# Patient Record
Sex: Female | Born: 1940 | Race: White | Hispanic: No | Smoking: Never smoker
Health system: Southern US, Community
[De-identification: ages and names within clinical notes are randomized; demographics above are authoritative.]

## PROBLEM LIST (undated history)

## (undated) ENCOUNTER — Ambulatory Visit: Payer: MEDICARE

## (undated) ENCOUNTER — Telehealth

## (undated) ENCOUNTER — Encounter

## (undated) ENCOUNTER — Telehealth: Attending: Hematology & Oncology | Primary: Hematology & Oncology

## (undated) ENCOUNTER — Encounter: Attending: Internal Medicine | Primary: Internal Medicine

## (undated) ENCOUNTER — Encounter: Attending: Hematology & Oncology | Primary: Hematology & Oncology

## (undated) ENCOUNTER — Ambulatory Visit

## (undated) ENCOUNTER — Encounter: Attending: Adult Health | Primary: Adult Health

## (undated) ENCOUNTER — Encounter: Attending: Pharmacist | Primary: Pharmacist

## (undated) ENCOUNTER — Telehealth: Attending: Adult Health | Primary: Adult Health

## (undated) ENCOUNTER — Ambulatory Visit: Attending: Hematology & Oncology | Primary: Hematology & Oncology

## (undated) ENCOUNTER — Telehealth
Attending: Pharmacist Clinician (PhC)/ Clinical Pharmacy Specialist | Primary: Pharmacist Clinician (PhC)/ Clinical Pharmacy Specialist

## (undated) ENCOUNTER — Ambulatory Visit: Payer: MEDICARE | Attending: Hematology & Oncology | Primary: Hematology & Oncology

## (undated) ENCOUNTER — Ambulatory Visit: Payer: MEDICARE | Attending: Adult Health | Primary: Adult Health

## (undated) ENCOUNTER — Institutional Professional Consult (permissible substitution): Payer: MEDICARE

## (undated) ENCOUNTER — Telehealth: Attending: Pharmacist | Primary: Pharmacist

## (undated) ENCOUNTER — Ambulatory Visit: Payer: MEDICARE | Attending: Internal Medicine | Primary: Internal Medicine

## (undated) ENCOUNTER — Ambulatory Visit: Payer: MEDICARE | Attending: Otolaryngology | Primary: Otolaryngology

## (undated) ENCOUNTER — Telehealth: Attending: Research Study | Primary: Research Study

## (undated) DIAGNOSIS — L409 Psoriasis, unspecified: Secondary | ICD-10-CM

## (undated) DIAGNOSIS — F419 Anxiety disorder, unspecified: Secondary | ICD-10-CM

## (undated) DIAGNOSIS — F32A Depression, unspecified: Secondary | ICD-10-CM

## (undated) DIAGNOSIS — K219 Gastro-esophageal reflux disease without esophagitis: Secondary | ICD-10-CM

## (undated) DIAGNOSIS — I447 Left bundle-branch block, unspecified: Secondary | ICD-10-CM

## (undated) DIAGNOSIS — C801 Malignant (primary) neoplasm, unspecified: Secondary | ICD-10-CM

## (undated) DIAGNOSIS — F329 Major depressive disorder, single episode, unspecified: Secondary | ICD-10-CM

## (undated) DIAGNOSIS — N200 Calculus of kidney: Secondary | ICD-10-CM

## (undated) DIAGNOSIS — K279 Peptic ulcer, site unspecified, unspecified as acute or chronic, without hemorrhage or perforation: Secondary | ICD-10-CM

## (undated) DIAGNOSIS — R197 Diarrhea, unspecified: Secondary | ICD-10-CM

## (undated) DIAGNOSIS — G43709 Chronic migraine without aura, not intractable, without status migrainosus: Secondary | ICD-10-CM

## (undated) DIAGNOSIS — E785 Hyperlipidemia, unspecified: Secondary | ICD-10-CM

## (undated) DIAGNOSIS — Z9221 Personal history of antineoplastic chemotherapy: Secondary | ICD-10-CM

## (undated) DIAGNOSIS — R55 Syncope and collapse: Secondary | ICD-10-CM

## (undated) DIAGNOSIS — N6019 Diffuse cystic mastopathy of unspecified breast: Secondary | ICD-10-CM

## (undated) DIAGNOSIS — K449 Diaphragmatic hernia without obstruction or gangrene: Secondary | ICD-10-CM

## (undated) HISTORY — PX: HERNIA REPAIR: SHX51

## (undated) HISTORY — DX: Diaphragmatic hernia without obstruction or gangrene: K44.9

## (undated) HISTORY — DX: Psoriasis, unspecified: L40.9

## (undated) HISTORY — DX: Calculus of kidney: N20.0

## (undated) HISTORY — PX: VESICOVAGINAL FISTULA CLOSURE W/ TAH: SUR271

## (undated) HISTORY — PX: PARTIAL HYSTERECTOMY: SHX80

## (undated) HISTORY — DX: Diffuse cystic mastopathy of unspecified breast: N60.19

## (undated) HISTORY — DX: Hyperlipidemia, unspecified: E78.5

## (undated) HISTORY — DX: Left bundle-branch block, unspecified: I44.7

## (undated) HISTORY — DX: Anxiety disorder, unspecified: F41.9

## (undated) HISTORY — DX: Syncope and collapse: R55

## (undated) HISTORY — DX: Chronic migraine without aura, not intractable, without status migrainosus: G43.709

## (undated) HISTORY — DX: Diarrhea, unspecified: R19.7

## (undated) HISTORY — DX: Major depressive disorder, single episode, unspecified: F32.9

## (undated) HISTORY — DX: Peptic ulcer, site unspecified, unspecified as acute or chronic, without hemorrhage or perforation: K27.9

## (undated) HISTORY — DX: Gastro-esophageal reflux disease without esophagitis: K21.9

## (undated) HISTORY — PX: APPENDECTOMY: SHX54

## (undated) HISTORY — DX: Depression, unspecified: F32.A

## (undated) SURGERY — Surgical Case
Anesthesia: *Unknown

## (undated) NOTE — *Deleted (*Deleted)
Transition of Care [facility]) - Initial/Assessment Note    Patient Details  Name: [name] MRN: [id number] Date of Birth: 11-12-1940  Transition of Care [facility]) CM/SW Contact:    Bing Quarry, RN Phone Number: 02/02/2020, 3:20 PM  Clinical Narrative:                         Patient Goals and CMS Choice        Expected Discharge Plan and Services                                                Prior Living Arrangements/Services                       Activities of Daily Living Home Assistive Devices/Equipment: None ADL Screening (condition at time of admission) Patient's cognitive ability adequate to safely complete daily activities?: Yes Is the patient deaf or have difficulty hearing?: No Does the patient have difficulty seeing, even when wearing glasses/contacts?: No Does the patient have difficulty concentrating, remembering, or making decisions?: No Patient able to express need for assistance with ADLs?: Yes Does the patient have difficulty dressing or bathing?: No Independently performs ADLs?: Yes (appropriate for developmental age) Does the patient have difficulty walking or climbing stairs?: No Weakness of Legs: None Weakness of Arms/Hands: None  Permission Sought/Granted                  Emotional Assessment              Admission diagnosis:  Syncope and collapse [R55] SOB (shortness of breath) [R06.02] Hypoxia [R09.02] Pneumonia due to COVID-19 virus [U07.1, J12.82] COVID-19 [U07.1] Patient Active Problem List   Diagnosis Date Noted  . Pneumonia due to COVID-19 virus 02-21-20  . Acute respiratory failure due to COVID-19 (HCC) Feb 21, 2020  . Thrombocytopenia (HCC) 02/21/20  . Abdominal pain 09/29/2016  . CLL (chronic lymphocytic leukemia) (HCC) 03/15/2016  . Low back pain 04/28/2015  . H/O adenomatous polyp of colon 04/30/2014  . Abdominal pain, epigastric 04/08/2014  . Acute diarrhea 04/08/2014  . Blood in  feces 04/08/2014  . Chronic migraine without aura 03/13/2014  . 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 Hypercholesterolemia 02/05/2014  . 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 Syncope and collapse 10/29/2009  . CHEST PAIN UNSPECIFIED 10/29/2009   PCP:  Lauro Regulus, MD Pharmacy:   [facility] [place], [place] - [address] [place] [address] Phone: [phone] Fax: [phone]  [id number] - [place], [place] - [address] [place] [address] Phone: [phone] Fax: [phone]     Social Determinants of Health (SDOH) Interventions    Readmission Risk Interventions No flowsheet data found.

---

## 1988-05-01 HISTORY — PX: BREAST EXCISIONAL BIOPSY: SUR124

## 2003-05-02 HISTORY — PX: BREAST EXCISIONAL BIOPSY: SUR124

## 2004-03-11 ENCOUNTER — Ambulatory Visit: Payer: Self-pay | Admitting: Internal Medicine

## 2004-04-28 ENCOUNTER — Ambulatory Visit: Payer: Self-pay | Admitting: Physician Assistant

## 2004-05-19 ENCOUNTER — Ambulatory Visit: Payer: Self-pay | Admitting: Internal Medicine

## 2004-07-28 ENCOUNTER — Ambulatory Visit: Payer: Self-pay | Admitting: Internal Medicine

## 2005-04-25 ENCOUNTER — Emergency Department: Payer: Self-pay | Admitting: Emergency Medicine

## 2005-11-07 ENCOUNTER — Ambulatory Visit: Payer: Self-pay | Admitting: Gerontology

## 2006-02-06 ENCOUNTER — Ambulatory Visit: Payer: Self-pay | Admitting: Gerontology

## 2006-06-26 ENCOUNTER — Ambulatory Visit: Payer: Self-pay | Admitting: Unknown Physician Specialty

## 2006-10-03 ENCOUNTER — Ambulatory Visit: Payer: Self-pay | Admitting: Internal Medicine

## 2006-10-26 ENCOUNTER — Inpatient Hospital Stay: Payer: Self-pay | Admitting: Internal Medicine

## 2006-10-26 ENCOUNTER — Other Ambulatory Visit: Payer: Self-pay

## 2006-11-12 ENCOUNTER — Ambulatory Visit: Payer: Self-pay | Admitting: Internal Medicine

## 2007-01-15 ENCOUNTER — Emergency Department: Payer: Self-pay | Admitting: Emergency Medicine

## 2007-01-21 ENCOUNTER — Emergency Department: Payer: Self-pay | Admitting: Emergency Medicine

## 2007-01-21 ENCOUNTER — Other Ambulatory Visit: Payer: Self-pay

## 2007-01-30 ENCOUNTER — Ambulatory Visit: Payer: Self-pay | Admitting: Emergency Medicine

## 2007-10-22 ENCOUNTER — Ambulatory Visit: Payer: Self-pay | Admitting: Internal Medicine

## 2008-02-18 ENCOUNTER — Ambulatory Visit: Payer: Self-pay | Admitting: Internal Medicine

## 2008-09-03 ENCOUNTER — Ambulatory Visit: Payer: Self-pay

## 2008-11-12 ENCOUNTER — Ambulatory Visit: Payer: Self-pay | Admitting: Unknown Physician Specialty

## 2008-11-25 ENCOUNTER — Ambulatory Visit: Payer: Self-pay | Admitting: Unknown Physician Specialty

## 2009-09-03 ENCOUNTER — Encounter: Payer: Self-pay | Admitting: Cardiovascular Disease

## 2009-09-03 LAB — CONVERTED CEMR LAB
ALT: 16 units/L
AST: 14 units/L
Alkaline Phosphatase: 88 units/L
BUN: 20 mg/dL
Calcium: 8.8 mg/dL
Cholesterol: 246 mg/dL
HDL: 47.3 mg/dL
LDL Cholesterol: 173.7 mg/dL
Total Protein: 6.4 g/dL
Triglyceride fasting, serum: 125 mg/dL

## 2009-10-02 ENCOUNTER — Observation Stay: Payer: Self-pay | Admitting: Internal Medicine

## 2009-10-02 ENCOUNTER — Ambulatory Visit: Payer: Self-pay | Admitting: Cardiovascular Disease

## 2009-10-28 ENCOUNTER — Encounter: Payer: Self-pay | Admitting: Cardiovascular Disease

## 2009-10-29 ENCOUNTER — Ambulatory Visit: Payer: Self-pay | Admitting: Cardiovascular Disease

## 2009-10-29 DIAGNOSIS — R55 Syncope and collapse: Secondary | ICD-10-CM

## 2009-10-29 DIAGNOSIS — R079 Chest pain, unspecified: Secondary | ICD-10-CM | POA: Insufficient documentation

## 2009-10-29 HISTORY — DX: Syncope and collapse: R55

## 2009-11-10 ENCOUNTER — Encounter: Payer: Self-pay | Admitting: Cardiovascular Disease

## 2009-11-30 ENCOUNTER — Telehealth: Payer: Self-pay | Admitting: Cardiovascular Disease

## 2010-02-22 ENCOUNTER — Ambulatory Visit: Payer: Self-pay | Admitting: Internal Medicine

## 2010-03-14 ENCOUNTER — Ambulatory Visit: Payer: Self-pay

## 2010-04-13 ENCOUNTER — Ambulatory Visit: Payer: Self-pay | Admitting: Unknown Physician Specialty

## 2010-04-15 LAB — PATHOLOGY REPORT

## 2010-04-21 ENCOUNTER — Ambulatory Visit: Payer: Self-pay | Admitting: Unknown Physician Specialty

## 2010-06-02 NOTE — Progress Notes (Signed)
Summary: Propranolol  ---- Converted from flag ---- ---- 11/19/2009 10:14 AM, Benedict Needy, RN wrote: Call mrs Kemp and find out how the test went with Dr Jenne Campus.  Then she will decide if she wants to start the propranolol 20mg  three times a day. ------------------------------  Phone Note Outgoing Call   Call placed by: Benedict Needy, RN,  November 30, 2009 10:51 AM Call placed to: Patient Summary of Call: pt declined starting propranolol.  Dr. Jenne Campus has referred her to psychiatrist.  Initial call taken by: Benedict Needy, RN,  December 01, 2009 2:25 PM

## 2010-06-02 NOTE — Procedures (Signed)
Summary: Holter and Event  Holter and Event   Imported By: Frazier Butt Chriscoe 11/19/2009 12:04:37  _____________________________________________________________________  External Attachment:    Type:   Image     Comment:   External Document  Appended Document: Holter and Event pt aware of results

## 2010-06-02 NOTE — Assessment & Plan Note (Signed)
Summary: NEW PT   Visit Type:  Initial Consult Primary Provider:  Einar Crow, M.D.  CC:  Mid sternum pain with dizziness and weakness and feels like could pass out at time.Marland Kitchen  History of Present Illness: 70 year old woman with a history of anxiety, depression, recent episodes of syncope with hospitalization at [facility] that included a thorough evaluation including head CT, carotid ultrasound, CT scan stress test, CT of the chest which showed no significant abnormalities. Potasium was 3.1.  She was started on a 30 day monitor and she presents for followup.  She reports that she has had a total of 4 episodes of syncope. She continues to have dizziness, a feeling of "swimmy headedness ". These symptoms occur at both rest and stress. Sometimes they are positional in nature. She recently had a root canal on several teeth and has had a headache since that time. No further episodes of syncope. No arrhythmia noted to date on her monitor.  she has been evaluated by Dr.Klein  in July of 2008. 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  Echocardiogram from June 2000 and shows normal systolic function, mild mitral regurgitation, mild tricuspid regurgitation.  Stress test in June of 2001 shows no significant ischemia, ejection fraction 75%. This was a left CT scan.  Ultrasound of the carotids shows calcified and soft plaque in the proximal ICA on the right and soft plaque elsewhere in the right bulb and on the left in the bulb and ICA, less than 50%.  Total cholesterol 166, LDL 98, HDL 35. TSH 1.15.  EKG shows normal sinus rhythm with rate of 70 beats per minute, no significant ST or T wave changes.  Current Medications (verified): 1)  Advair Diskus 250-50 Mcg/dose Aepb (Fluticasone-Salmeterol) .... One Puff Two Times A Day 2)  Allegra 180 Mg Tabs (Fexofenadine Hcl) .... Once Daily 3)  Combivent 18-103 Mcg/act Aero  (Ipratropium-Albuterol) .... Inhale Two Puffs Using Four Times A Day As Needed 4)  Flonase 50 Mcg/act Susp (Fluticasone Propionate) .... Spray Two Sprays Into Both Nostrils Two Times A Day 5)  Lorazepam 0.5 Mg Tabs (Lorazepam) .... One Tablet Two Times A Day 6)  Pravastatin Sodium 40 Mg Tabs (Pravastatin Sodium) .... Two Tablets Once Daily 7)  Sertaline 1mg  .... Once Daily 8)  Tylenol With Codeine #3 300-30 Mg Tabs (Acetaminophen-Codeine) .Marland Kitchen.. 1 Table Every Four Hours As Needed For Pain. 9)  Ibuprofen 200 Mg Tabs (Ibuprofen) .... 4-5 Tablets Everyday For Back Pain  Allergies (verified): No Known Drug Allergies  Past History:  Past Medical History: Last updated: 10/28/2009 Anxiety Asthma Hyperlipidemia fibrocystic disease Hiatal hernia GERD Psoriasis Osteoporosis Depression Renal Stones Peptic ulcer disease  Past Surgical History: Last updated: 10/28/2009 hysterectomy appendectomy breast biopsies  Family History: Last updated: 10/28/2009 Family History of Coronary Artery Disease:   Social History: Last updated: 10/28/2009 Married  Tobacco Use - No.  Alcohol Use - no  Risk Factors: Smoking Status: never (10/28/2009)  Review of Systems       The patient complains of syncope.  The patient denies fever, weight loss, weight gain, vision loss, decreased hearing, hoarseness, chest pain, dyspnea on exertion, peripheral edema, prolonged cough, abdominal pain, incontinence, muscle weakness, depression, and enlarged lymph nodes.    Vital Signs:  Patient profile:   70 year old female Height:      68 inches Weight:      190.50 pounds BMI:     29.07 Pulse  rate:   72 / minute BP sitting:   137 / 81  (left arm) Cuff size:   regular  Vitals Entered By: Bishop Dublin, CMA (October 29, 2009 2:35 PM)  Physical Exam  General:  Well developed, well nourished, in no acute distress. Head:  normocephalic and atraumatic Neck:  Neck supple, no JVD. No masses, thyromegaly or  abnormal cervical nodes. Chest Wall:  no deformities or breast masses noted Lungs:  Clear bilaterally to auscultation and percussion. Heart:  Non-displaced PMI, chest non-tender; regular rate and rhythm, S1, S2 without murmurs, rubs or gallops. Carotid upstroke normal, no bruit.  Pedals normal pulses. No edema, no varicosities. Abdomen:  Bowel sounds positive; abdomen soft and non-tender without masses Msk:  Back normal, normal gait. Muscle strength and tone normal. Pulses:  pulses normal in all 4 extremities Extremities:  No clubbing or cyanosis. Neurologic:  Alert and oriented x 3. Skin:  Intact without lesions or rashes. Psych:  Normal affect.   Impression & Recommendations:  Problem # 1:  SYNCOPE AND COLLAPSE (ICD-780.2) etiology of her syncope is likely due to vasovagal syncope/neurocardiogenic syncope. I have encouraged her to increase her fluid hydration, consider wearing TED hose, even consider liberalizing her salt intake. she will continue to wear the monitor for a total of 30 days.  Previous evaluation by Dr. Graciela Husbands had suggested considering SSRIs, limiting stress, retrying beta blockers. On his previous evaluation, she had severe exercise intolerance with evidence of chronotropic incompetence. Perhaps she would benefit from some regular exercise.  She continues to feel some episodes of dizziness over the past week or so consistent with her previous symptoms prior to syncope. No arrhythmia seen on her monitor. This raises the possibility of a vestibular problem. I have recommended that she could have a second opinion from ENT.  we will see her back after her monitors complete.   Other Orders: EKG w/ Interpretation (93000)  Appended Document: NEW PT cholesterol is very elevated with LDL greater than 170, total cholesterol 246, HDL 47 We will talk to her on her next visit about if she would like to start a statin.  Appended Document: NEW PT 30 day event monitor shows no  arrhythymia. Will discuss with patient. Suggest we could try low dose propranolol for possible neurocardiogenic syncope

## 2010-09-13 NOTE — Letter (Signed)
November 12, 2006    Einar Crow, M.D.  [address]  [place], [place] [address]   RE:  [name], [name]  MRN:  [id number]  /  DOB:  02/25/1941   Dear Gaynell Face:   It is a pleasure to see Ms. Leanord Hawking at your request today.  She  is a very pleasant woman with a great deal of stress in her life  currently, as you know.  She is age 70, with her husband having  Alzheimer's, her mother having Alzheimer's, and she having had a life-  long history of recurrent syncope and pre-syncope which is associated  with atypical idiosyncratic program.  These spells are characterized by  significant fatigue, flushing and diaphoresis in the program, flushing  and diaphoresis and urination in the recovery phase with residual  orthostatic intolerance.  The pre-syncopal episodes are provoked simply  by standing and in fact by showers.  The most recent pre-syncopal  episodes occurred shortly after shoulder surgery, one in the kitchen  where she was found to be anemic and one after swimming where she got  quite hot.  In her mind, all of these were preceded by the same program  with which she has been familiar for the last 40-50 years.   She is a salt eater.  She has some peripheral edema and this has  resulted in cutting back on her salt.  Her fluid status is quite  depleted.  Her urine is quite dark.  She does not have hypertension or  diabetes.   She has undergone extensive testing by Dr. Bobbye Riggs, demonstrating a  negative perfusion study and negative echocardiogram, but a strikingly  limited exercise tolerance with evidence of chronotropic incompetence.   PAST MEDICAL HISTORY:  1. Her related medical history if notable for depression and stress,      which is striking.  2. In addition to the above is notable for emphysema.  3. Constipation, now giving rise to diarrhea.  4. Dry eyes and dry mouth.   PAST SURGICAL HISTORY:  1. Appendectomy.  2. Partial  hysterectomy.   SOCIAL HISTORY:  As noted as before.  She does not use cigarettes,  alcohol or recreational drugs.  She owns a Manufacturing systems engineer.   CURRENT MEDICATIONS:  1. Effexor, dose unknown.  2. Advair.  3. Propranolol 80 mg, recently initiated.   ALLERGIES:  No known drug allergies.   PHYSICAL EXAMINATION:  GENERAL:  She is an elderly Caucasian female,  appearing her stated age of 26.  VITAL SIGNS:  Blood pressure 110/72, pulse 62, with insignificant  orthostatic change.  HEENT:  Demonstrates no icterus or xanthoma.  NECK:  Veins were flat.  The carotids brisk and full bilaterally without  bruits.  BACK:  Without kyphosis or scoliosis.  LUNGS:  Clear.  HEART:  Sounds regular without murmurs or gallops.  ABDOMEN:  Soft, with active bowel sounds without midline pulsation or  hepatomegaly.  EXTREMITIES:  Femoral pulses 2+, distal pulses intact.  There was no  clubbing or cyanosis or edema.  NEUROLOGIC:  Grossly normal.  SKIN:  Warm and dry.   Electrocardiogram dated today demonstrated a sinus rhythm at 62 with an  interval of 0.20, 0.09, 0.40.  The electrocardiogram was otherwise  normal.   IMPRESSION:  1. Neurocardiogenic syncope with recent increase in the severity and      frequency of her episodes, potentially related to increase in      psychosocial stress, as well as ambient  triggers.  2. Relative state of volume depletion.  3. Exercise intolerance - striking, with evidence of chronotropic      incompetence.  4. Anxiety/depression.   Gaynell Face, Ms. 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  I hope the beta blocker that  you give her does serve some good, as it might well.  In addition, I  think that intentional therapy directed at her stressful social  situation may be particularly useful.  Some of the SSRIs can be helpful  here, targeting both the depression as well as neurotransmission of  serotonin, related to  neurally-mediated syncope.   The other issue which I would like to discuss with you is the evidence  of chronotropic incompetence and whether cardiopulmonary stress testing  might be of value in trying to elucidate the limitations here.  She has  a history of emphysema and asthma, and I wonder whether pulmonary  function tests may be helpful as a prelude to this.   I will look forward to talking about her.  Thanks very much for asking  [place] to see her.    Sincerely,      Duke Salvia, MD, [facility]  Electronically Signed    SCK/MedQ  DD: 11/12/2006  DT: 11/13/2006  Job #: [id number]

## 2010-10-14 ENCOUNTER — Encounter: Payer: Self-pay | Admitting: Cardiology

## 2011-02-28 ENCOUNTER — Ambulatory Visit: Payer: Self-pay | Admitting: Internal Medicine

## 2011-06-29 ENCOUNTER — Ambulatory Visit: Payer: Self-pay | Admitting: Internal Medicine

## 2012-01-04 ENCOUNTER — Ambulatory Visit: Payer: Self-pay | Admitting: Unknown Physician Specialty

## 2012-02-22 ENCOUNTER — Ambulatory Visit: Payer: Self-pay | Admitting: Unknown Physician Specialty

## 2012-03-01 ENCOUNTER — Ambulatory Visit: Payer: Self-pay | Admitting: Unknown Physician Specialty

## 2012-06-26 ENCOUNTER — Ambulatory Visit: Payer: Self-pay | Admitting: Internal Medicine

## 2012-07-25 ENCOUNTER — Ambulatory Visit: Payer: Self-pay | Admitting: Internal Medicine

## 2012-09-27 ENCOUNTER — Ambulatory Visit: Payer: Self-pay | Admitting: Cardiology

## 2012-12-06 ENCOUNTER — Ambulatory Visit: Payer: Self-pay | Admitting: Cardiology

## 2013-07-28 ENCOUNTER — Ambulatory Visit: Payer: Self-pay | Admitting: Internal Medicine

## 2014-02-05 DIAGNOSIS — I447 Left bundle-branch block, unspecified: Secondary | ICD-10-CM

## 2014-02-05 DIAGNOSIS — E7800 Pure hypercholesterolemia, unspecified: Secondary | ICD-10-CM | POA: Insufficient documentation

## 2014-02-05 HISTORY — DX: Left bundle-branch block, unspecified: I44.7

## 2014-03-13 DIAGNOSIS — G43709 Chronic migraine without aura, not intractable, without status migrainosus: Secondary | ICD-10-CM | POA: Insufficient documentation

## 2014-03-13 DIAGNOSIS — F324 Major depressive disorder, single episode, in partial remission: Secondary | ICD-10-CM | POA: Insufficient documentation

## 2014-03-13 HISTORY — DX: Chronic migraine without aura, not intractable, without status migrainosus: G43.709

## 2014-04-08 DIAGNOSIS — R197 Diarrhea, unspecified: Secondary | ICD-10-CM | POA: Insufficient documentation

## 2014-04-08 DIAGNOSIS — R1013 Epigastric pain: Secondary | ICD-10-CM | POA: Insufficient documentation

## 2014-04-08 DIAGNOSIS — K921 Melena: Secondary | ICD-10-CM | POA: Insufficient documentation

## 2014-04-08 HISTORY — DX: Diarrhea, unspecified: R19.7

## 2014-04-15 ENCOUNTER — Ambulatory Visit: Payer: Self-pay | Admitting: Unknown Physician Specialty

## 2014-04-15 LAB — CLOSTRIDIUM DIFFICILE(ARMC)

## 2014-04-17 LAB — STOOL CULTURE

## 2014-04-30 DIAGNOSIS — Z860101 Personal history of adenomatous and serrated colon polyps: Secondary | ICD-10-CM | POA: Insufficient documentation

## 2014-04-30 DIAGNOSIS — Z86010 Personal history of colonic polyps: Secondary | ICD-10-CM | POA: Insufficient documentation

## 2014-05-04 ENCOUNTER — Ambulatory Visit: Payer: Self-pay | Admitting: Unknown Physician Specialty

## 2014-07-23 ENCOUNTER — Ambulatory Visit: Payer: Self-pay | Admitting: Internal Medicine

## 2014-08-06 ENCOUNTER — Ambulatory Visit
Admit: 2014-08-06 | Disposition: A | Discharge status: Home / Self Care | Payer: Self-pay | Attending: Obstetrics and Gynecology | Admitting: Obstetrics and Gynecology

## 2014-08-24 LAB — SURGICAL PATHOLOGY

## 2015-02-09 ENCOUNTER — Other Ambulatory Visit: Payer: Self-pay | Admitting: Physician Assistant

## 2015-02-09 DIAGNOSIS — R1084 Generalized abdominal pain: Secondary | ICD-10-CM

## 2015-02-09 DIAGNOSIS — R1032 Left lower quadrant pain: Secondary | ICD-10-CM

## 2015-02-09 DIAGNOSIS — R197 Diarrhea, unspecified: Secondary | ICD-10-CM

## 2015-02-11 ENCOUNTER — Ambulatory Visit
Admission: RE | Admit: 2015-02-11 | Discharge: 2015-02-11 | Disposition: A | Discharge status: Home / Self Care | Payer: Medicare Other | Source: Ambulatory Visit | Attending: Physician Assistant | Admitting: Physician Assistant

## 2015-02-11 DIAGNOSIS — K449 Diaphragmatic hernia without obstruction or gangrene: Secondary | ICD-10-CM | POA: Insufficient documentation

## 2015-02-11 DIAGNOSIS — K5730 Diverticulosis of large intestine without perforation or abscess without bleeding: Secondary | ICD-10-CM | POA: Diagnosis not present

## 2015-02-11 DIAGNOSIS — R1032 Left lower quadrant pain: Secondary | ICD-10-CM

## 2015-02-11 DIAGNOSIS — R1084 Generalized abdominal pain: Secondary | ICD-10-CM

## 2015-02-11 DIAGNOSIS — R197 Diarrhea, unspecified: Secondary | ICD-10-CM

## 2015-02-11 MED ORDER — IOHEXOL 300 MG/ML  SOLN
100.0000 mL | Freq: Once | INTRAMUSCULAR | Status: AC | PRN
  Administered 2015-02-11: 100 mL via INTRAVENOUS

## 2015-03-01 ENCOUNTER — Ambulatory Visit: Payer: Self-pay | Admitting: Podiatry

## 2015-03-29 ENCOUNTER — Encounter: Payer: Self-pay | Admitting: *Deleted

## 2015-03-30 ENCOUNTER — Ambulatory Visit (INDEPENDENT_AMBULATORY_CARE_PROVIDER_SITE_OTHER): Payer: Medicare Other | Admitting: Obstetrics and Gynecology

## 2015-03-30 ENCOUNTER — Encounter: Payer: Self-pay | Admitting: Obstetrics and Gynecology

## 2015-03-30 VITALS — BP 132/75 | HR 70 | Resp 18 | Ht 68.0 in | Wt 189.7 lb

## 2015-03-30 DIAGNOSIS — R350 Frequency of micturition: Secondary | ICD-10-CM | POA: Diagnosis not present

## 2015-03-30 DIAGNOSIS — R310 Gross hematuria: Secondary | ICD-10-CM | POA: Diagnosis not present

## 2015-03-30 LAB — MICROSCOPIC EXAMINATION

## 2015-03-30 LAB — URINALYSIS, COMPLETE
Bilirubin, UA: NEGATIVE
GLUCOSE, UA: NEGATIVE
KETONES UA: NEGATIVE
NITRITE UA: NEGATIVE
Protein, UA: NEGATIVE
SPEC GRAV UA: 1.025 (ref 1.005–1.030)
UUROB: 0.2 mg/dL (ref 0.2–1.0)
pH, UA: 5.5 (ref 5.0–7.5)

## 2015-03-30 NOTE — Progress Notes (Signed)
03/30/2015 9:32 AM   [name] 1941/04/12 [id number]  Referring provider: [name], MD [place], [place] [address]  Chief Complaint  Patient presents with  . Hematuria  . Establish Care    HPI: Patient is a 74 year old female with a history of kidney stones presenting as a referral for recurrent episodes of gross hematuria. Per available lab results patient's had numerous urinalyses positive for microscopic hematuria dating back to May 2015. All urine cultures that have been sent since that time have been negative for infection. It seems the patient was originally referred to [facility] urological and she was not told that they did not accept her insurance and the appointment was not scheduled.  Urinary symptoms include foul smelling urine, occasional dysuria, urinary frequency q 2 hours, nocturia 3-4 times per night and urge incontinence at night.  Mid low back pain for the last 3-4 days. No fevers.  Never smoker. Retired. Worked as a Psychiatric nurse for 45 years.  S/p hysterectomy.  No vaginal complaints.  01/18/15 Cr 0.9 Hbg 12.7  PMH: Past Medical History  Diagnosis Date  . Anxiety   . Asthma   . Hyperlipidemia   . Fibrocystic disease of breast   . Hiatal hernia   . GERD (gastroesophageal reflux disease)   . Psoriasis   . Osteoporosis   . Depression   . Renal stone   . Peptic ulcer disease     Surgical History: Past Surgical History  Procedure Laterality Date  . Vesicovaginal fistula closure w/ tah    . Appendectomy    . Breast biopsy      Home Medications:    Medication List       This list is accurate as of: 03/30/15  9:32 AM.  Always use your most recent med list.               acetaminophen 500 MG tablet  Commonly known as:  TYLENOL  1-2 tablets by mouth single dose as needed for pain.     cabergoline 0.5 MG tablet  Commonly known as:  DOSTINEX  Take by mouth.     fexofenadine 180 MG tablet   Commonly known as:  ALLEGRA  Take 180 mg by mouth daily.     fluticasone 50 MCG/ACT nasal spray  Commonly known as:  FLONASE  Place 2 sprays into the nose 2 (two) times daily.     HYDROcodone-acetaminophen 5-325 MG tablet  Commonly known as:  NORCO/VICODIN     ibuprofen 200 MG tablet  Commonly known as:  ADVIL,MOTRIN  Take 200 mg by mouth 4 (four) times daily.     Iron Polysacch Cmplx-B12-FA 150-0.025-1 MG Caps  Take by mouth.     LORazepam 0.5 MG tablet  Commonly known as:  ATIVAN  Take 0.5 mg by mouth 2 (two) times daily.     omeprazole 40 MG capsule  Commonly known as:  PRILOSEC     pantoprazole 40 MG tablet  Commonly known as:  PROTONIX  Take by mouth.     pravastatin 40 MG tablet  Commonly known as:  PRAVACHOL  Take 80 mg by mouth daily.     sucralfate 1 G tablet  Commonly known as:  CARAFATE        Allergies: No Known Allergies  Family History: Family History  Problem Relation Age of Onset  . Coronary artery disease Other   . Hematuria Father     Social History:  reports that she has never smoked. She does not have any smokeless tobacco history on file. She reports that she does not drink alcohol. Her drug history is not on file.  ROS: UROLOGY Frequent Urination?: Yes Hard to postpone urination?: No Burning/pain with urination?: No Get up at night to urinate?: Yes Leakage of urine?: No Urine stream starts and stops?: No Trouble starting stream?: No Do you have to strain to urinate?: No Blood in urine?: Yes Urinary tract infection?: No Sexually transmitted disease?: No Injury to kidneys or bladder?: No Painful intercourse?: No Weak stream?: No Currently pregnant?: No Vaginal bleeding?: No Last menstrual period?: n/a  Gastrointestinal Nausea?: Yes Vomiting?: No Indigestion/heartburn?: Yes Diarrhea?: Yes Constipation?: Yes  Constitutional Fever: No Night sweats?: Yes Weight loss?: No Fatigue?: Yes  Skin Skin rash/lesions?:  No Itching?: Yes  Eyes Blurred vision?: Yes Double vision?: No  Ears/Nose/Throat Sore throat?: Yes Sinus problems?: Yes  Hematologic/Lymphatic Swollen glands?: No Easy bruising?: Yes  Cardiovascular Leg swelling?: Yes Chest pain?: Yes  Respiratory Cough?: Yes Shortness of breath?: Yes  Endocrine Excessive thirst?: Yes  Musculoskeletal Back pain?: Yes Joint pain?: Yes  Neurological Headaches?: Yes Dizziness?: Yes  Psychologic Depression?: Yes Anxiety?: Yes  Physical Exam: BP 132/75 mmHg  Pulse 70  Resp 18  Ht 5\' 8"  (1.727 m)  Wt 189 lb 11.2 oz (86.047 kg)  BMI 28.85 kg/m2  Constitutional:  Alert and oriented, No acute distress. HEENT: [place] AT, moist mucus membranes.  Trachea midline, no masses. Cardiovascular: No clubbing, cyanosis, or edema. Respiratory: Normal respiratory effort, no increased work of breathing. GI: Abdomen is soft, nontender, nondistended, no abdominal masses GU: No CVA tenderness.  Skin: No rashes, bruises or suspicious lesions. Lymph: No cervical or inguinal adenopathy. Neurologic: Grossly intact, no focal deficits, moving all 4 extremities. Psychiatric: Normal mood and affect.  Laboratory Data:   Urinalysis   Pertinent Imaging:   Assessment & Plan:    1. Gross Hematuria- We discussed the differential diagnosis for hematuria including nephrolithiasis, renal or upper tract tumors, bladder stones, UTIs, or bladder tumors as well as undetermined etiologies. Per AUA guidelines, I did recommend complete hematuria evaluation including CTU, possible urine cytology, and office cystoscopy. - Urinalysis, Complete -CT Urogram  2. Urinary frequency and nocturnal urge incontinence-  Will address urinary symptoms once hematuria work up complete.  Return for CT Urogram results/cystoscopy.  These notes generated with voice recognition software. I apologize for typographical errors.  Herbert Moors, [facility] [address] [place], [place] [address] [phone]

## 2015-03-30 NOTE — Patient Instructions (Signed)
Hematuria, Adult  Hematuria is blood in your urine. It can be caused by a bladder infection, kidney infection, prostate infection, kidney stone, or cancer of your urinary tract. Infections can usually be treated with medicine, and a kidney stone usually will pass through your urine. If neither of these is the cause of your hematuria, further workup to find out the reason may be needed.  It is very important that you tell your health care provider about any blood you see in your urine, even if the blood stops without treatment or happens without causing pain. Blood in your urine that happens and then stops and then happens again can be a symptom of a very serious condition. Also, pain is not a symptom in the initial stages of many urinary cancers.  HOME CARE INSTRUCTIONS   · Drink lots of fluid, 3-4 quarts a day. If you have been diagnosed with an infection, cranberry juice is especially recommended, in addition to large amounts of water.  · Avoid caffeine, tea, and carbonated beverages because they tend to irritate the bladder.  · Avoid alcohol because it may irritate the prostate.  · Take all medicines as directed by your health care provider.  · If you were prescribed an antibiotic medicine, finish it all even if you start to feel better.  · If you have been diagnosed with a kidney stone, follow your health care provider's instructions regarding straining your urine to catch the stone.  · Empty your bladder often. Avoid holding urine for long periods of time.  · After a bowel movement, women should cleanse front to back. Use each tissue only once.  · Empty your bladder before and after sexual intercourse if you are a female.  SEEK MEDICAL CARE IF:  · You develop back pain.  · You have a fever.  · You have a feeling of sickness in your stomach (nausea) or vomiting.  · Your symptoms are not better in 3 days. Return sooner if you are getting worse.  SEEK IMMEDIATE MEDICAL CARE IF:   · You develop severe vomiting and  are unable to keep the medicine down.  · You develop severe back or abdominal pain despite taking your medicines.  · You begin passing a large amount of blood or clots in your urine.  · You feel extremely weak or faint, or you pass out.  MAKE SURE YOU:   · Understand these instructions.  · Will watch your condition.  · Will get help right away if you are not doing well or get worse.     This information is not intended to replace advice given to you by your health care provider. Make sure you discuss any questions you have with your health care provider.     Document Released: 04/17/2005 Document Revised: 05/08/2014 Document Reviewed: 12/16/2012  Elsevier Interactive Patient Education ©2016 [employer]  Cystoscopy  Cystoscopy is a procedure that is used to help your caregiver diagnose and sometimes treat conditions that affect your lower urinary tract. Your lower urinary tract includes your bladder and the tube through which urine passes from your bladder out of your body (urethra). Cystoscopy is performed with a thin, tube-shaped instrument (cystoscope). The cystoscope has lenses and a light at the end so that your caregiver can see inside your bladder. The cystoscope is inserted at the entrance of your urethra. Your caregiver guides it through your urethra and into your bladder. There are two main types of cystoscopy:  · Flexible cystoscopy (with   a flexible cystoscope).  · Rigid cystoscopy (with a rigid cystoscope).  Cystoscopy may be recommended for many conditions, including:  · Urinary tract infections.  · Blood in your urine (hematuria).  · Loss of bladder control (urinary incontinence) or overactive bladder.  · Unusual cells found in a urine sample.  · Urinary blockage.  · Painful urination.  Cystoscopy may also be done to remove a sample of your tissue to be checked under a microscope (biopsy). It may also be done to remove or destroy bladder stones.  LET YOUR CAREGIVER KNOW ABOUT:  · Allergies to food or  medicine.  · Medicines taken, including vitamins, herbs, eyedrops, over-the-counter medicines, and creams.  · Use of steroids (by mouth or creams).  · Previous problems with anesthetics or numbing medicines.  · History of bleeding problems or blood clots.  · Previous surgery.  · Other health problems, including diabetes and kidney problems.  · Possibility of pregnancy, if this applies.  PROCEDURE  The area around the opening to your urethra will be cleaned. A medicine to numb your urethra (local anesthetic) is used. If a tissue sample or stone is removed during the procedure, you may be given a medicine to make you sleep (general anesthetic).  Your caregiver will gently insert the tip of the cystoscope into your urethra. The cystoscope will be slowly glided through your urethra and into your bladder. Sterile fluid will flow through the cystoscope and into your bladder. The fluid will expand and stretch your bladder. This gives your caregiver a better view of your bladder walls. The procedure lasts about 15-20 minutes.  AFTER THE PROCEDURE  If a local anesthetic is used, you will be allowed to go home as soon as you are ready. If a general anesthetic is used, you will be taken to a recovery area until you are stable. You may have temporary bleeding and burning on urination.     This information is not intended to replace advice given to you by your health care provider. Make sure you discuss any questions you have with your health care provider.     Document Released: 04/14/2000 Document Revised: 05/08/2014 Document Reviewed: 10/09/2011  Elsevier Interactive Patient Education ©2016 Elsevier Inc.

## 2015-04-06 ENCOUNTER — Ambulatory Visit
Admission: RE | Admit: 2015-04-06 | Discharge: 2015-04-06 | Disposition: A | Discharge status: Home / Self Care | Payer: Medicare Other | Source: Ambulatory Visit | Attending: Obstetrics and Gynecology | Admitting: Obstetrics and Gynecology

## 2015-04-06 DIAGNOSIS — K449 Diaphragmatic hernia without obstruction or gangrene: Secondary | ICD-10-CM | POA: Insufficient documentation

## 2015-04-06 DIAGNOSIS — R599 Enlarged lymph nodes, unspecified: Secondary | ICD-10-CM | POA: Insufficient documentation

## 2015-04-06 DIAGNOSIS — R310 Gross hematuria: Secondary | ICD-10-CM | POA: Insufficient documentation

## 2015-04-06 DIAGNOSIS — R161 Splenomegaly, not elsewhere classified: Secondary | ICD-10-CM | POA: Insufficient documentation

## 2015-04-06 MED ORDER — IOHEXOL 300 MG/ML  SOLN: 150.0000 mL | Freq: Once | INTRAMUSCULAR | Status: DC | PRN

## 2015-04-07 ENCOUNTER — Telehealth: Payer: Self-pay | Admitting: Urology

## 2015-04-07 NOTE — Telephone Encounter (Signed)
Pt called asking for results of CT Urogram.  Pt has an appt on 12/15 to see Dr. Erlene Quan for those results & cystoscopy.  Pt stated that she was in a lot of pain and could not wait until 12/15 to get results.  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  Please call.

## 2015-04-07 NOTE — Telephone Encounter (Signed)
Please see if she can get her in sooner? She needs to have an appointment where she'll go over the results as well as have a cystoscopy. The CT scan did not show any obstructing stones or anything that needs to be emergently addressed. We please ask her where she is having pain.  Thanks

## 2015-04-13 NOTE — Telephone Encounter (Signed)
Patient scheduled 04/14/2015 for CT results & cysto with Dr. Erlene Quan.

## 2015-04-14 ENCOUNTER — Other Ambulatory Visit: Payer: Medicare Other | Admitting: Urology

## 2015-04-14 ENCOUNTER — Ambulatory Visit (INDEPENDENT_AMBULATORY_CARE_PROVIDER_SITE_OTHER): Payer: Medicare Other | Admitting: Urology

## 2015-04-14 VITALS — BP 166/90 | HR 87 | Ht 68.0 in | Wt 184.6 lb

## 2015-04-14 DIAGNOSIS — R350 Frequency of micturition: Secondary | ICD-10-CM | POA: Diagnosis not present

## 2015-04-14 DIAGNOSIS — R310 Gross hematuria: Secondary | ICD-10-CM

## 2015-04-14 DIAGNOSIS — R300 Dysuria: Secondary | ICD-10-CM

## 2015-04-14 LAB — URINALYSIS, COMPLETE
BILIRUBIN UA: NEGATIVE
Glucose, UA: NEGATIVE
Ketones, UA: NEGATIVE
Nitrite, UA: NEGATIVE
Specific Gravity, UA: 1.02 (ref 1.005–1.030)
Urobilinogen, Ur: 0.2 mg/dL (ref 0.2–1.0)
pH, UA: 5.5 (ref 5.0–7.5)

## 2015-04-14 LAB — MICROSCOPIC EXAMINATION: RBC, UA: >30 /hpf — ABNORMAL HIGH (ref 0–?)

## 2015-04-14 MED ORDER — CEPHALEXIN 500 MG PO CAPS: 500.0000 mg | ORAL_CAPSULE | Freq: Three times a day (TID) | ORAL | Status: DC

## 2015-04-14 NOTE — Progress Notes (Signed)
9:19 AM  04/14/2015   [name] 1940/06/07 [id number]  Referring provider: [name], MD [place], [place] [address]  Chief Complaint  Patient presents with  . Cysto    HPI: 74 year old female with history of kidney stones and microscopic/ gross hematuria.  She presents today to complete her hematuria work up with cystoscopy. CT urogram showed no GU pathology. She does have some incidental borderline pelvic adenopathy and slight splenomegaly.    She does complain today of dysuria which started 4 days ago. She's also had increased urgency and frequency. She also may have had some low-grade temps although she has not been using a thermometer. UA today suspicious for infection.  She's had severe lower back pain over the past few weeks radiate down the back of her left thigh. She's getting an MRI for further workup with this tomorrow.  Never smoker. Retired. Worked as a Psychiatric nurse for 66 years.     PMH: Past Medical History  Diagnosis Date  . Anxiety   . Asthma   . Hyperlipidemia   . Fibrocystic disease of breast   . Hiatal hernia   . GERD (gastroesophageal reflux disease)   . Psoriasis   . Osteoporosis   . Depression   . Renal stone   . Peptic ulcer disease     Surgical History: Past Surgical History  Procedure Laterality Date  . Vesicovaginal fistula closure w/ tah    . Appendectomy    . Breast biopsy      Home Medications:    Medication List       This list is accurate as of: 04/14/15  9:19 AM.  Always use your most recent med list.               acetaminophen 500 MG tablet  Commonly known as:  TYLENOL  1-2 tablets by mouth single dose as needed for pain.     fexofenadine 180 MG tablet  Commonly known as:  ALLEGRA  Take 180 mg by mouth daily.     fluticasone 50 MCG/ACT nasal spray  Commonly known as:  FLONASE  Place 2 sprays into the nose 2 (two) times daily.     ibuprofen 200 MG tablet   Commonly known as:  ADVIL,MOTRIN  Take 200 mg by mouth 4 (four) times daily.     Iron Polysacch Cmplx-B12-FA 150-0.025-1 MG Caps  Take by mouth.     LORazepam 0.5 MG tablet  Commonly known as:  ATIVAN  Take 0.5 mg by mouth 2 (two) times daily.     omeprazole 40 MG capsule  Commonly known as:  PRILOSEC     oxyCODONE-acetaminophen 5-325 MG tablet  Commonly known as:  PERCOCET/ROXICET     pantoprazole 40 MG tablet  Commonly known as:  PROTONIX  Take by mouth.     pravastatin 40 MG tablet  Commonly known as:  PRAVACHOL  Take 80 mg by mouth daily.     sucralfate 1 G tablet  Commonly known as:  CARAFATE        Allergies: No Known Allergies  Family History: Family History  Problem Relation Age of Onset  . Coronary artery disease Other   . Hematuria Father     Social History:  reports that she has never smoked. She does not have any smokeless tobacco history on file. She reports that she does not drink alcohol. Her drug history is not on file.   Physical Exam: BP  166/90 mmHg  Pulse 87  Ht 5\' 8"  (1.727 m)  Wt 184 lb 9.6 oz (83.734 kg)  BMI 28.07 kg/m2  Constitutional:  Alert and oriented, No acute distress. HEENT: [place] AT, moist mucus membranes.  Trachea midline, no masses. Cardiovascular: No clubbing, cyanosis, or edema. Respiratory: Normal respiratory effort, no increased work of breathing. GI: Abdomen is soft, nontender, nondistended, no abdominal masses GU: No CVA tenderness.  Skin: No rashes, bruises or suspicious lesions. Neurologic: Grossly intact, no focal deficits, moving all 4 extremities. Psychiatric: Normal mood and affect.  Urinalysis See Epic, suspicious for infection.  Pertinent Imaging:  CLINICAL DATA: Micro hematuria occurred in October and November. RIGHT-sided back pain. Fall 2 weeks prior.  EXAM: CT ABDOMEN AND PELVIS WITHOUT AND WITH CONTRAST  TECHNIQUE: Multidetector CT imaging of the abdomen and pelvis was performed following the  standard protocol before and following the bolus administration of intravenous contrast.  CONTRAST: 125 cc Omnipaque  COMPARISON: CT 02/11/2015  FINDINGS: Lower chest: Lung bases are clear.  Hepatobiliary: No focal hepatic lesion. No biliary duct dilatation. Gallbladder is normal. Common bile duct is normal.  Pancreas: Pancreas is normal. No ductal dilatation. No pancreatic inflammation.  Spleen: Spleen is mildly enlarged with a calculated volume of 613 cubic cm.  Adrenals/urinary tract: Adrenal glands are normal. Non IV contrast images demonstrate no nephrolithiasis. No ureterolithiasis or obstructive uropathy. Cortical phase imaging demonstrates no enhancing lesion. Delayed pyelogram phase imaging demonstrates no filling defects within the collecting systems or ureters. There bilateral parapelvic cysts noted.  No bladder calculi. No filling within the bladder  Stomach/Bowel: Large hiatal hernia with the near entirety of the stomach above the hemidiaphragms. Duodenum and small bowel normal. Appendix not identified. There are scattered diverticula without acute inflammation. Rectum normal.  Vascular/Lymphatic: Abdominal aorta is normal caliber with atherosclerotic calcification. There is no retroperitoneal or periportal lymphadenopathy. No pelvic lymphadenopathy. Mildly enlarged LEFT common iliac lymph node measures 10 mm (image 48, series 4). LEFT EXTERNAL ILIAC LYMPH NODE MEASURES 6 MM ON IMAGE 64, SERIES 4. LEFT EXTERNAL ILIAC LYMPH NODE MEASURES 11 MM ON IMAGE 75 SERIES 4.  LARGEST LYMPH NODES is RIGHT EXTERNAL ILIAC LYMPH NODE MEASURES 19 MM SHORT AXIS ON IMAGE 73, SERIES 4.  Reproductive: Post hysterectomy.  Other: Small amount free fluid in the pelvis.  Musculoskeletal: No aggressive osseous lesion.  IMPRESSION: 1. No explanation for hematuria. No nephrolithiasis, ureterolithiasis, enhancing renal cortical lesion, or filling defects within  the collecting systems. 2. No bladder stones or filling defects in the bladder which does not excluded a bladder lesion. 3. Mild pelvic lymphadenopathy and mild splenomegaly. Correlate with mild lymphoproliferative disorder. 4. Large hiatal hernia.   Electronically Signed  By: Suzy Bouchard M.D.  On: 04/06/2015 16:06  Assessment & Plan:   1. Dysuria-  Symptomatic, treat for presumed infection (Keflex tid x 3 days) F/u urine culture  2.  Gross/ microscopic hematuria- CT Urogram reviewed  3. Urinary frequency and nocturnal urge incontinence-  Will address urinary symptoms once hematuria work up complete.  Return in about 2 weeks (around 04/28/2015) for reschedule cystoscopy.  Hollice Espy, MD  [facility] [address] [place], [place] [address] [phone]

## 2015-04-14 NOTE — Progress Notes (Signed)
When pt was checking out she developed light headedness, sweating, chills, and weakness. Pt was placed in a room given crackers, water, and VS taken. BP 154/89, P85, Temp 97.5. Pt was c/o of severe, 10/10, back pain. Pt was advised to go to the ER. Pt called a family member to take her to ER.

## 2015-04-15 ENCOUNTER — Other Ambulatory Visit: Payer: Medicare Other | Admitting: Urology

## 2015-04-16 LAB — CULTURE, URINE COMPREHENSIVE

## 2015-04-28 ENCOUNTER — Ambulatory Visit (HOSPITAL_BASED_OUTPATIENT_CLINIC_OR_DEPARTMENT_OTHER): Payer: Medicare Other | Admitting: Hematology and Oncology

## 2015-04-28 ENCOUNTER — Encounter: Payer: Self-pay | Admitting: Hematology and Oncology

## 2015-04-28 VITALS — BP 166/75 | HR 75 | Temp 98.1°F | Resp 18 | Ht 68.0 in | Wt 185.7 lb

## 2015-04-28 DIAGNOSIS — M898X8 Other specified disorders of bone, other site: Secondary | ICD-10-CM

## 2015-04-28 DIAGNOSIS — M5441 Lumbago with sciatica, right side: Secondary | ICD-10-CM | POA: Diagnosis not present

## 2015-04-28 DIAGNOSIS — M545 Low back pain, unspecified: Secondary | ICD-10-CM | POA: Insufficient documentation

## 2015-04-28 MED ORDER — METAXALONE 400 MG PO TABS: 400.0000 mg | ORAL_TABLET | Freq: Two times a day (BID) | ORAL | Status: DC

## 2015-04-28 NOTE — Assessment & Plan Note (Signed)
Severe low back pain with pain radiating down bilateral lower extremities: MRI of the spine reveals severe degenerative arthritic changes at multiple levels especially L4-L5 and L5-S1 which is suspect is the major cause of her symptoms. Even though there is no disc herniation or foraminal narrowing I suspect her symptoms are related to degenerative arthritis.  On clinical examination there was severe back spasms. For this reason I prescribed her muscle relaxant with Skelaxin 400 mg by mouth twice a day when necessary for the muscle aches and pains.  Increased bone marrow activity in the spine: I reviewed the MRI report. I do not have corresponding lab values during that time with regards to a CBC. I will request lab core to send [place] all the lab results that they have. Previously she has not been in a make our has not been found to have evidence of myeloproliferative disease. It is unclear to me what the bone marrow activity indicates other than to suggest that there is excessive blood production in those areas for uncertain reasons. It is not a pathologic sign. It is not the cause of her symptoms either.   Back pain: Since oral pain medications have not been relieving her symptoms, patient and her family requested a referral to neurosurgery. Patient's daughter-in-law had seen Dr. Barbaraann Barthel with neurosurgery. 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  We will try to obtain her blood work and call her if there are any lab values that would need additional workup regarding the increased bone marrow activity.

## 2015-04-28 NOTE — Progress Notes (Signed)
[facility] NOTE  Patient Care Team: [name], MD as PCP - General (Internal Medicine)  CHIEF COMPLAINTS/PURPOSE OF CONSULTATION:  Increased bone marrow activity  HISTORY OF PRESENTING ILLNESS:  [name] 74 y.o. female is here because of recent diagnosis of increased bone marrow activity. Patient recently fell from her bed and hurt her rib cage. A few days later started having pain in the knee and the right shoulder. Subsequently she started having pain in the lower back. She had seen orthopedic specialists were performed MRI of her back that revealed multilevel degenerative arthritic changes and L4-L5 and L5-S1 disc protrusion but no prolapse or foraminal narrowing. She also noticed hematuria and an underwent a CT of her abdomen. The CT scan showed small pelvic lymphadenopathy that are not pathologic as well as mild splenomegaly.  Patient is extremely uncomfortable with the low back pain and has been taking pain medications around the clock and 6 in spite of that she still has pain. She has episodes when she does not have any pain at all. When the pain hits she is very uncomfortable and keeps her awake at night.  I reviewed her records extensively and collaborated the history with the patient.  MEDICAL HISTORY:  Past Medical History  Diagnosis Date  . Anxiety   . Asthma   . Hyperlipidemia   . Fibrocystic disease of breast   . Hiatal hernia   . GERD (gastroesophageal reflux disease)   . Psoriasis   . Osteoporosis   . Depression   . Renal stone   . Peptic ulcer disease     SURGICAL HISTORY: Past Surgical History  Procedure Laterality Date  . Vesicovaginal fistula closure w/ tah    . Appendectomy    . Breast biopsy      SOCIAL HISTORY: Social History   Social History  . Marital Status: Widowed    Spouse Name: N/A  . Number of Children: N/A  . Years of Education: N/A   Occupational History  . Not on file.   Social History Main  Topics  . Smoking status: Never Smoker   . Smokeless tobacco: Not on file  . Alcohol Use: No  . Drug Use: Not on file  . Sexual Activity: Not on file   Other Topics Concern  . Not on file   Social History Narrative   Married    FAMILY HISTORY: Family History  Problem Relation Age of Onset  . Coronary artery disease Other   . Hematuria Father     ALLERGIES:  has No Known Allergies.  MEDICATIONS:  Current Outpatient Prescriptions  Medication Sig Dispense Refill  . acetaminophen (TYLENOL) 500 MG tablet 1-2 tablets by mouth single dose as needed for pain.    . cephALEXin (KEFLEX) 500 MG capsule Take 1 capsule (500 mg total) by mouth 3 (three) times daily. 12 capsule 0  . fexofenadine (ALLEGRA) 180 MG tablet Take 180 mg by mouth daily.      . fluticasone (FLONASE) 50 MCG/ACT nasal spray Place 2 sprays into the nose 2 (two) times daily.      Marland Kitchen ibuprofen (ADVIL,MOTRIN) 200 MG tablet Take 200 mg by mouth 4 (four) times daily.      . Iron Polysacch Cmplx-B12-FA 150-0.025-1 MG CAPS Take by mouth.    Marland Kitchen LORazepam (ATIVAN) 0.5 MG tablet Take 0.5 mg by mouth 2 (two) times daily.      Marland Kitchen omeprazole (PRILOSEC) 40 MG capsule     . oxyCODONE-acetaminophen (  PERCOCET/ROXICET) 5-325 MG tablet     . pantoprazole (PROTONIX) 40 MG tablet Take by mouth.    . pravastatin (PRAVACHOL) 40 MG tablet Take 80 mg by mouth daily.      . sucralfate (CARAFATE) 1 G tablet     . metaxalone (SKELAXIN) 400 MG tablet Take 1 tablet (400 mg total) by mouth 2 (two) times daily. 60 tablet 0   No current facility-administered medications for this visit.    REVIEW OF SYSTEMS:   Constitutional: Denies fevers, chills or abnormal night sweats Eyes: Denies blurriness of vision, double vision or watery eyes Ears, nose, mouth, throat, and face: Denies mucositis or sore throat Respiratory: Denies cough, dyspnea or wheezes Cardiovascular: Denies palpitation, chest discomfort or lower extremity swelling Gastrointestinal:   Denies nausea, heartburn or change in bowel habits Skin: Denies abnormal skin rashes Lymphatics: Denies new lymphadenopathy or easy bruising Neurological: Severe low back pain and tenderness to palpation of the lower back. Behavioral/Psych: Mood is stable, no new changes  Breast: Nipple discharge and has seen multiple specialists for this in the past. All other systems were reviewed with the patient and are negative.  PHYSICAL EXAMINATION: ECOG PERFORMANCE STATUS: 2 - Symptomatic, <50% confined to bed  Filed Vitals:   04/28/15 1549  BP: 166/75  Pulse: 75  Temp: 98.1 F (36.7 C)  Resp: 18   Filed Weights   04/28/15 1549  Weight: 185 lb 11.2 oz (84.233 kg)    GENERAL:alert, no distress and comfortable SKIN: skin color, texture, turgor are normal, no rashes or significant lesions EYES: normal, conjunctiva are pink and non-injected, sclera clear OROPHARYNX:no exudate, no erythema and lips, buccal mucosa, and tongue normal  NECK: supple, thyroid normal size, non-tender, without nodularity LYMPH:  no palpable lymphadenopathy in the cervical, axillary or inguinal LUNGS: clear to auscultation and percussion with normal breathing effort HEART: regular rate & rhythm and no murmurs and no lower extremity edema ABDOMEN:abdomen soft, non-tender and normal bowel sounds Musculoskeletal: Severe muscle spasm and tenderness to palpation of the lower back paraspinal muscles. PSYCH: alert & oriented x 3 with fluent speech NEURO: no focal motor/sensory deficits  LABORATORY DATA:  I have reviewed the data as listed No results found for: WBC, HGB, HCT, MCV, PLT Lab Results  Component Value Date   NA 138.0 09/03/2009   K 4.3 09/03/2009   CL 106 09/03/2009   CO2 27.8 09/03/2009   ASSESSMENT AND PLAN:  Low back pain Severe low back pain with pain radiating down bilateral lower extremities: MRI of the spine reveals severe degenerative arthritic changes at multiple levels especially L4-L5 and  L5-S1 which is suspect is the major cause of her symptoms. Even though there is no disc herniation or foraminal narrowing I suspect her symptoms are related to degenerative arthritis.  On clinical examination there was severe back spasms. For this reason I prescribed her muscle relaxant with Skelaxin 400 mg by mouth twice a day when necessary for the muscle aches and pains.  Increased bone marrow activity in the spine: I reviewed the MRI report. I do not have corresponding lab values during that time with regards to a CBC. I will request lab core to send [place] all the lab results that they have. Previously she has not been in a make our has not been found to have evidence of myeloproliferative disease. It is unclear to me what the bone marrow activity indicates other than to suggest that there is excessive blood production in those areas for uncertain  reasons. It is not a pathologic sign. It is not the cause of her symptoms either.   Back pain: Since oral pain medications have not been relieving her symptoms, patient and her family requested a referral to neurosurgery. Patient's daughter-in-law had seen Dr. Barbaraann Barthel with neurosurgery. 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  We will try to obtain her blood work and call her if there are any lab values that would need additional workup regarding the increased bone marrow activity.    we will not make any appointments for her unless there are other findings on the blood work that will raise a concern for a hematologic disorder. All questions were answered. The patient knows to call the clinic with any problems, questions or concerns.    Rulon Eisenmenger, MD 04/28/2015

## 2015-04-29 ENCOUNTER — Other Ambulatory Visit: Payer: Self-pay | Admitting: *Deleted

## 2015-04-29 ENCOUNTER — Encounter: Payer: Self-pay | Admitting: Urology

## 2015-04-29 ENCOUNTER — Telehealth: Payer: Self-pay | Admitting: *Deleted

## 2015-04-29 ENCOUNTER — Other Ambulatory Visit: Payer: Self-pay | Admitting: Hematology and Oncology

## 2015-04-29 ENCOUNTER — Other Ambulatory Visit: Payer: Medicare Other | Admitting: Urology

## 2015-04-29 ENCOUNTER — Telehealth: Payer: Self-pay | Admitting: Hematology and Oncology

## 2015-04-29 DIAGNOSIS — M5441 Lumbago with sciatica, right side: Secondary | ICD-10-CM

## 2015-04-29 MED ORDER — TIZANIDINE HCL 2 MG PO TABS: 2.0000 mg | ORAL_TABLET | Freq: Three times a day (TID) | ORAL | Status: DC | PRN

## 2015-04-29 NOTE — Telephone Encounter (Signed)
Spoke with patient and she is aware of her lab 12/30

## 2015-04-29 NOTE — Telephone Encounter (Signed)
[name] with Dr. Ruthann Cancer Anderson's office called reporting "no recent labs on this patient.  "We haven't seen her in over six months.  Not sure she's still our patient."

## 2015-04-29 NOTE — Telephone Encounter (Signed)
Per 12/29 note from [place] she sent the referral to dr Leda Gauze office already

## 2015-04-29 NOTE — Telephone Encounter (Signed)
Returned patients call to reschedule her  appointment °

## 2015-04-29 NOTE — Telephone Encounter (Signed)
Faxed referral to Dr. Donnella Bi office.

## 2015-04-30 ENCOUNTER — Other Ambulatory Visit (HOSPITAL_BASED_OUTPATIENT_CLINIC_OR_DEPARTMENT_OTHER): Payer: Medicare Other

## 2015-04-30 ENCOUNTER — Ambulatory Visit: Payer: Medicare Other | Admitting: Oncology

## 2015-04-30 DIAGNOSIS — M5441 Lumbago with sciatica, right side: Secondary | ICD-10-CM

## 2015-04-30 LAB — CBC & DIFF AND RETIC
BASO%: 0.2 % (ref 0.0–2.0)
BASOS ABS: 0 10*3/uL (ref 0.0–0.1)
EOS ABS: 0.1 10*3/uL (ref 0.0–0.5)
EOS%: 1.3 % (ref 0.0–7.0)
HCT: 37.7 % (ref 34.8–46.6)
HEMOGLOBIN: 13 g/dL (ref 11.6–15.9)
IMMATURE RETIC FRACT: 7.7 % (ref 1.60–10.00)
LYMPH%: 61.9 % — ABNORMAL HIGH (ref 14.0–49.7)
MCH: 30.7 pg (ref 25.1–34.0)
MCHC: 34.5 g/dL (ref 31.5–36.0)
MCV: 89.1 fL (ref 79.5–101.0)
MONO#: 0.4 10*3/uL (ref 0.1–0.9)
MONO%: 4.2 % (ref 0.0–14.0)
NEUT%: 32.4 % — ABNORMAL LOW (ref 38.4–76.8)
NEUTROS ABS: 2.9 10*3/uL (ref 1.5–6.5)
NRBC: 0 % (ref 0–0)
Platelets: 142 10*3/uL — ABNORMAL LOW (ref 145–400)
RBC: 4.23 10*6/uL (ref 3.70–5.45)
RDW: 14.2 % (ref 11.2–14.5)
RETIC %: 1.56 % (ref 0.70–2.10)
RETIC CT ABS: 65.99 10*3/uL (ref 33.70–90.70)
WBC: 9 10*3/uL (ref 3.9–10.3)
lymph#: 5.6 10*3/uL — ABNORMAL HIGH (ref 0.9–3.3)

## 2015-04-30 LAB — COMPREHENSIVE METABOLIC PANEL
ALBUMIN: 4 g/dL (ref 3.5–5.0)
ALT: 21 U/L (ref 0–55)
AST: 19 U/L (ref 5–34)
Alkaline Phosphatase: 84 U/L (ref 40–150)
Anion Gap: 7 mEq/L (ref 3–11)
BUN: 21.8 mg/dL (ref 7.0–26.0)
CHLORIDE: 108 meq/L (ref 98–109)
CO2: 24 meq/L (ref 22–29)
Calcium: 9.3 mg/dL (ref 8.4–10.4)
Creatinine: 0.8 mg/dL (ref 0.6–1.1)
EGFR: 69 mL/min/{1.73_m2} — AB (ref >90)
GLUCOSE: 109 mg/dL (ref 70–140)
POTASSIUM: 5 meq/L (ref 3.5–5.1)
SODIUM: 139 meq/L (ref 136–145)
TOTAL PROTEIN: 6.8 g/dL (ref 6.4–8.3)
Total Bilirubin: 0.61 mg/dL (ref 0.20–1.20)

## 2015-04-30 LAB — TECHNOLOGIST REVIEW

## 2015-05-02 HISTORY — PX: EXCISION OF BREAST BIOPSY: SHX5822

## 2015-05-04 ENCOUNTER — Telehealth: Payer: Self-pay

## 2015-05-04 ENCOUNTER — Other Ambulatory Visit: Payer: Medicare Other | Admitting: Urology

## 2015-05-04 NOTE — Telephone Encounter (Signed)
Referral sent to Dr. Sherwood Gambler - referral order, faxe sheet, insurance card, last office note.  Faxed to [facility].  Let pt know all info sent and she should be hearing from [facility] at Dr Carrie Mew.  Pt voiced understanding.

## 2015-05-05 ENCOUNTER — Other Ambulatory Visit: Payer: Medicare Other

## 2015-05-05 DIAGNOSIS — R300 Dysuria: Secondary | ICD-10-CM

## 2015-05-05 LAB — SPEP & IFE WITH QIG
Albumin ELP: 3.9 g/dL (ref 3.8–4.8)
Alpha-1-Globulin: 0.3 g/dL (ref 0.2–0.3)
Alpha-2-Globulin: 0.6 g/dL (ref 0.5–0.9)
Beta 2: 0.3 g/dL (ref 0.2–0.5)
Beta Globulin: 0.4 g/dL (ref 0.4–0.6)
Gamma Globulin: 0.9 g/dL (ref 0.8–1.7)
IGG (IMMUNOGLOBIN G), SERUM: 996 mg/dL (ref 690–1700)
IGM, SERUM: 59 mg/dL (ref 52–322)
IgA: 87 mg/dL (ref 69–380)
TOTAL PROTEIN, SERUM ELECTROPHOR: 6.4 g/dL (ref 6.1–8.1)

## 2015-05-05 LAB — MICROSCOPIC EXAMINATION

## 2015-05-05 LAB — URINALYSIS, COMPLETE
BILIRUBIN UA: NEGATIVE
Glucose, UA: NEGATIVE
Ketones, UA: NEGATIVE
NITRITE UA: NEGATIVE
PH UA: 5.5 (ref 5.0–7.5)
Specific Gravity, UA: >1.03 — ABNORMAL HIGH (ref 1.005–1.030)
UUROB: 0.2 mg/dL (ref 0.2–1.0)

## 2015-05-06 ENCOUNTER — Encounter: Payer: Self-pay | Admitting: Urology

## 2015-05-06 ENCOUNTER — Ambulatory Visit (INDEPENDENT_AMBULATORY_CARE_PROVIDER_SITE_OTHER): Payer: Medicare Other | Admitting: Urology

## 2015-05-06 VITALS — BP 133/79 | HR 76 | Ht 68.0 in | Wt 184.8 lb

## 2015-05-06 DIAGNOSIS — R310 Gross hematuria: Secondary | ICD-10-CM | POA: Diagnosis not present

## 2015-05-06 LAB — URINALYSIS, COMPLETE
BILIRUBIN UA: NEGATIVE
Glucose, UA: NEGATIVE
LEUKOCYTES UA: NEGATIVE
NITRITE UA: NEGATIVE
PH UA: 5.5 (ref 5.0–7.5)
UUROB: 0.2 mg/dL (ref 0.2–1.0)

## 2015-05-06 LAB — MICROSCOPIC EXAMINATION: RBC, UA: >30 /hpf — ABNORMAL HIGH (ref 0–?)

## 2015-05-06 MED ORDER — CIPROFLOXACIN HCL 500 MG PO TABS
500.0000 mg | ORAL_TABLET | Freq: Once | ORAL | Status: AC
  Administered 2015-05-06: 500 mg via ORAL

## 2015-05-06 MED ORDER — LIDOCAINE HCL 2 % EX GEL
1.0000 "application " | Freq: Once | CUTANEOUS | Status: AC
  Administered 2015-05-06: 1 via URETHRAL

## 2015-05-06 NOTE — Progress Notes (Signed)
05/06/2015 12:14 PM   [name] 14-Dec-1940 [id number]  Referring provider: [name], MD [facility] [place], [place] [address]  Chief Complaint  Patient presents with  . Cysto    gross/microscopic hematuria    HPI: 75 year old female with history of kidney stones and microscopic/ gross hematuria. She presents today to complete her hematuria work up with cystoscopy. CT urogram showed no GU pathology. She does have some incidental borderline pelvic adenopathy and slight splenomegaly.   The patient also notes urinary frequency and nocturia 4. She has occasional incontinence with sneezing or coughing. She does not find this very bothersome at this time. She is not interested in medications. She is more concerned with her back pain that radiates to her lower extremities.  Never smoker. Retired. Worked as a Psychiatric nurse for 12 years.    PMH: Past Medical History  Diagnosis Date  . Anxiety   . Asthma   . Hyperlipidemia   . Fibrocystic disease of breast   . Hiatal hernia   . GERD (gastroesophageal reflux disease)   . Psoriasis   . Osteoporosis   . Depression   . Renal stone   . Peptic ulcer disease   . 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 Chronic migraine without aura 03/13/2014    Last Assessment & Plan:  Headaches stable generally   . Syncope and collapse 10/29/2009    Qualifier: Diagnosis of  By: Rockey Situ MD, Tim    . Acute diarrhea 04/08/2014    Surgical History: Past Surgical History  Procedure Laterality Date  . Vesicovaginal fistula closure w/ tah    . Appendectomy    . Breast biopsy      Home Medications:    Medication List       This list is accurate as of: 05/06/15 12:14 PM.  Always use your most recent med list.               acetaminophen 500 MG tablet  Commonly known as:  TYLENOL  1-2 tablets by mouth single dose as needed for pain.     cephALEXin 500 MG capsule  Commonly known as:  KEFLEX  Take 1  capsule (500 mg total) by mouth 3 (three) times daily.     fexofenadine 180 MG tablet  Commonly known as:  ALLEGRA  Take 180 mg by mouth daily.     fluticasone 50 MCG/ACT nasal spray  Commonly known as:  FLONASE  Place 2 sprays into the nose 2 (two) times daily.     ibuprofen 200 MG tablet  Commonly known as:  ADVIL,MOTRIN  Take 200 mg by mouth 4 (four) times daily.     Iron Polysacch Cmplx-B12-FA 150-0.025-1 MG Caps  Take by mouth.     LORazepam 0.5 MG tablet  Commonly known as:  ATIVAN  Take 0.5 mg by mouth 2 (two) times daily.     metaxalone 400 MG tablet  Commonly known as:  SKELAXIN  Take 1 tablet (400 mg total) by mouth 2 (two) times daily.     omeprazole 40 MG capsule  Commonly known as:  PRILOSEC     oxyCODONE-acetaminophen 5-325 MG tablet  Commonly known as:  PERCOCET/ROXICET     pantoprazole 40 MG tablet  Commonly known as:  PROTONIX  Take by mouth.     pravastatin 40 MG tablet  Commonly known as:  PRAVACHOL  Take 80 mg by mouth daily.     sucralfate 1 g  tablet  Commonly known as:  CARAFATE     tiZANidine 2 MG tablet  Commonly known as:  ZANAFLEX  Take 1 tablet (2 mg total) by mouth every 8 (eight) hours as needed for muscle spasms.        Allergies: No Known Allergies  Family History: Family History  Problem Relation Age of Onset  . Coronary artery disease Other   . Hematuria Father     Social History:  reports that she has never smoked. She does not have any smokeless tobacco history on file. She reports that she does not drink alcohol. Her drug history is not on file.  ROS: UROLOGY Frequent Urination?: Yes Hard to postpone urination?: No Burning/pain with urination?: Yes Get up at night to urinate?: Yes Leakage of urine?: No Urine stream starts and stops?: No Trouble starting stream?: No Do you have to strain to urinate?: No Blood in urine?: Yes Urinary tract infection?: No Sexually transmitted disease?: No Injury to kidneys or  bladder?: No Painful intercourse?: No Weak stream?: No Currently pregnant?: No Vaginal bleeding?: No Last menstrual period?: N/A  Gastrointestinal Nausea?: No Vomiting?: No Indigestion/heartburn?: No Diarrhea?: No Constipation?: No  Constitutional Fever: No Night sweats?: No Weight loss?: No  Skin Skin rash/lesions?: No Itching?: No  Eyes Blurred vision?: No Double vision?: No  Ears/Nose/Throat Sore throat?: No Sinus problems?: No  Hematologic/Lymphatic Swollen glands?: No Easy bruising?: No  Cardiovascular Leg swelling?: No Chest pain?: No  Respiratory Cough?: No Shortness of breath?: No  Endocrine Excessive thirst?: No  Musculoskeletal Back pain?: Yes Joint pain?: No  Neurological Headaches?: No Dizziness?: No  Psychologic Depression?: No Anxiety?: Yes  Physical Exam: BP 133/79 mmHg  Pulse 76  Ht 5\' 8"  (1.727 m)  Wt 184 lb 12.8 oz (83.825 kg)  BMI 28.11 kg/m2  Constitutional:  Alert and oriented, No acute distress. HEENT: [place] AT, moist mucus membranes.  Trachea midline, no masses. Cardiovascular: No clubbing, cyanosis, or edema. Respiratory: Normal respiratory effort, no increased work of breathing. GI: Abdomen is soft, nontender, nondistended, no abdominal masses GU: No CVA tenderness.  Skin: No rashes, bruises or suspicious lesions. Lymph: No cervical or inguinal adenopathy. Neurologic: Grossly intact, no focal deficits, moving all 4 extremities. Psychiatric: Normal mood and affect.  Laboratory Data: Lab Results  Component Value Date   WBC 9.0 04/30/2015   HGB 13.0 04/30/2015   HCT 37.7 04/30/2015   MCV 89.1 04/30/2015   PLT 142* 04/30/2015    Lab Results  Component Value Date   CREATININE 0.8 04/30/2015    No results found for: PSA  No results found for: TESTOSTERONE  No results found for: HGBA1C  Urinalysis    Component Value Date/Time   GLUCOSEU Negative 05/05/2015 1513   BILIRUBINUR Negative 05/05/2015 1513    NITRITE Negative 05/05/2015 1513   LEUKOCYTESUR Trace* 05/05/2015 1513     Cystoscopy Procedure Note  Patient identification was confirmed, informed consent was obtained, and patient was prepped using Betadine solution.  Lidocaine jelly was administered per urethral meatus.    Preoperative abx where received prior to procedure.    Procedure: - Flexible cystoscope introduced, without any difficulty.   - Thorough search of the bladder revealed:    normal urethral meatus    normal urothelium    no stones    no ulcers     no tumors    no urethral polyps    no trabeculation  - Ureteral orifices were normal in position and appearance.  Post-Procedure: -  Patient tolerated the procedure well   Assessment & Plan:    1. Gross hematuria Negative hematuria work up. Follow up in 1 year with repeat urinalysis  2. Urinary frequency The patient doesn't find it bothersome at this time. She is more concerned with her lower back pain that radiates to her legs. She will call the office if this becomes bothersome to her.   Return in about 1 year (around 05/05/2016) for for urinalysis.  Nickie Retort, MD  [facility] [address] [place], [place] [address] [phone]

## 2015-05-11 ENCOUNTER — Encounter: Payer: Self-pay | Admitting: Hematology and Oncology

## 2015-05-11 NOTE — Progress Notes (Signed)
I sent staff message to nurse-Terri to see what drugs were tried and failed. I need for prior auth for metaxalone per [employer]

## 2015-05-17 ENCOUNTER — Encounter: Payer: Self-pay | Admitting: Hematology and Oncology

## 2015-05-17 NOTE — Progress Notes (Signed)
I faxed office notes to [employer] for appeal on denial of [place] for metaxalone. [phone]

## 2015-05-19 ENCOUNTER — Telehealth: Payer: Self-pay | Admitting: *Deleted

## 2015-05-19 ENCOUNTER — Encounter: Payer: Self-pay | Admitting: Hematology and Oncology

## 2015-05-19 NOTE — Progress Notes (Signed)
Answered questions with jeida for review for appeal on metaxalone.

## 2015-05-19 NOTE — Telephone Encounter (Signed)
Call received from [facility].  Call transferred to [facility].

## 2015-05-20 ENCOUNTER — Telehealth: Payer: Self-pay | Admitting: Neurology

## 2015-05-20 ENCOUNTER — Ambulatory Visit (INDEPENDENT_AMBULATORY_CARE_PROVIDER_SITE_OTHER): Payer: Medicare Other | Admitting: Neurology

## 2015-05-20 ENCOUNTER — Other Ambulatory Visit: Payer: Self-pay | Admitting: Neurology

## 2015-05-20 ENCOUNTER — Encounter: Payer: Self-pay | Admitting: Neurology

## 2015-05-20 ENCOUNTER — Ambulatory Visit
Admission: RE | Admit: 2015-05-20 | Discharge: 2015-05-20 | Disposition: A | Discharge status: Home / Self Care | Payer: Medicare Other | Source: Ambulatory Visit | Attending: Neurology | Admitting: Neurology

## 2015-05-20 ENCOUNTER — Encounter: Payer: Self-pay | Admitting: Hematology and Oncology

## 2015-05-20 VITALS — BP 161/95 | HR 76 | Ht 67.0 in | Wt 184.5 lb

## 2015-05-20 DIAGNOSIS — M5441 Lumbago with sciatica, right side: Secondary | ICD-10-CM

## 2015-05-20 DIAGNOSIS — M25551 Pain in right hip: Secondary | ICD-10-CM

## 2015-05-20 DIAGNOSIS — G8929 Other chronic pain: Secondary | ICD-10-CM

## 2015-05-20 MED ORDER — GABAPENTIN 100 MG PO CAPS: ORAL_CAPSULE | ORAL | Status: DC

## 2015-05-20 NOTE — Progress Notes (Signed)
Reason for visit:  Back pain, right leg pain  Referring physician:  Dr. Jamie Brookes is a 75 y.o. female  History of present illness:   [name] is a 75 year old right-handed white female with a history of onset of back pain and right leg pain that occurred on 03/17/2015. Two weeks before this, she fell out of bed, and bruised her right flank. The patient had also been quite active recently with carrying heavy objects, pulling things out of the attic. The patient began having discomfort in the right lower back, pain into the right hip and groin area, and discomfort going down the leg primarily to the knee but also at times down to the foot. The patient indicates that her entire right leg will tingle. She has some mild left-sided hip pain as well. She denies any pain down the left leg. 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 She has been placed on tizanidine which was helpful to start with, but there is no longer beneficial for her. The patient underwent MRI evaluation of the lumbar spine that was brought for my review. 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 The patient was seen by Dr. Sherwood Gambler, she was not felt to have any surgically amenable problems, and she is referred to this office for an evaluation. The patient denies any further falls. The pain is somewhat worse with standing, but she does have pain even while sitting or lying down. The pain is worse in the evening hours.  Past Medical History  Diagnosis Date  . Anxiety   . Asthma   . Hyperlipidemia   . Fibrocystic disease of breast   . Hiatal hernia   . GERD (gastroesophageal reflux disease)   . Psoriasis   . Osteoporosis   . Depression   . Renal stone   . Peptic ulcer disease   . Block, bundle branch,  left 02/05/2014  . Chronic migraine without aura 03/13/2014    Last Assessment & Plan:  Headaches stable generally   . Syncope and collapse 10/29/2009    Qualifier: Diagnosis of  By: Rockey Situ MD, Tim    . Acute diarrhea 04/08/2014    Past Surgical History  Procedure Laterality Date  . Vesicovaginal fistula closure w/ tah    . Appendectomy    . Breast biopsy    . Partial hysterectomy    . Hernia repair      Family History  Problem Relation Age of Onset  . Coronary artery disease Other   . Hematuria Father   . Congestive Heart Failure Father   . Dementia Mother   . Congestive Heart Failure Mother   . Neuropathy Brother     Social history:  reports that she has never smoked. She has never used smokeless tobacco. She reports that she does not drink alcohol or use illicit drugs.  Medications:  Prior to Admission medications   Medication Sig Start Date End Date Taking? Authorizing Provider  cephALEXin (KEFLEX) 500 MG capsule Take 1 capsule (500 mg total) by mouth 3 (three) times daily. 04/14/15  Yes Hollice Espy, MD  ibuprofen (ADVIL,MOTRIN) 200 MG tablet Take 200 mg by mouth 4 (four) times daily.     Yes Historical Provider, MD  Iron Polysacch Cmplx-B12-FA 150-0.025-1 MG CAPS Take by mouth.  Yes Historical Provider, MD  LORazepam (ATIVAN) 0.5 MG tablet Take 0.5 mg by mouth 2 (two) times daily.     Yes Historical Provider, MD  omeprazole (PRILOSEC) 40 MG capsule  01/28/15  Yes Historical Provider, MD  pravastatin (PRAVACHOL) 40 MG tablet Take 80 mg by mouth daily.     Yes Historical Provider, MD  tiZANidine (ZANAFLEX) 2 MG tablet Take 1 tablet (2 mg total) by mouth every 8 (eight) hours as needed for muscle spasms. 04/29/15  Yes Nicholas Lose, MD  gabapentin (NEURONTIN) 100 MG capsule One capsule three times a day for 1 week, then take 2 capsules three times a day 05/20/15   Kathrynn Ducking, MD     No Known Allergies  ROS:  Out of a complete 14 system review of symptoms, the  patient complains only of the following symptoms, and all other reviewed systems are negative.   Weight gain  Swelling in the legs  Eye pain  Shortness of breath, cough  Constipation  Feeling hot  Joint pain, joint swelling, aching muscles  Memory loss, headache, weakness , dizziness  Blood pressure 161/95, pulse 76, height 5\' 7"  (1.702 m), weight 184 lb 8 oz (83.689 kg).  Physical Exam  General: The patient is alert and cooperative at the time of the examination.  Eyes: Pupils are equal, round, and reactive to light. Discs are flat bilaterally.  Neck: The neck is supple, no carotid bruits are noted.  Respiratory: The respiratory examination is clear.  Cardiovascular: The cardiovascular examination reveals a regular rate and rhythm, no obvious murmurs or rubs are noted.   Neuromuscular: The patient does have some discomfort with external rotation of the right hip are not present on the left. The patient has discomfort palpation over the right SI joint, she reports some radiation of pain down to the knee on the right.  Skin: Extremities are without significant edema.  Neurologic Exam  Mental status: The patient is alert and oriented x 3 at the time of the examination. The patient has apparent normal recent and remote memory, with an apparently normal attention span and concentration ability.  Cranial nerves: Facial symmetry is present. There is good sensation of the face to pinprick and soft touch bilaterally. The strength of the facial muscles and the muscles to head turning and shoulder shrug are normal bilaterally. Speech is well enunciated, no aphasia or dysarthria is noted. Extraocular movements are full. Visual fields are full. The tongue is midline, and the patient has symmetric elevation of the soft palate. No obvious hearing deficits are noted.  Motor: The motor testing reveals 5 over 5 strength of all 4 extremities. Good symmetric motor tone is noted  throughout.  Sensory: Sensory testing is intact to pinprick, soft touch, vibration sensation, and position sense on all 4 extremities, with exception of some stocking pattern pinprick sensory deficit up to the knees bilaterally, decrease in position sense of the left foot and vibration sensation on the right foot. No evidence of extinction is noted.  Coordination: Cerebellar testing reveals good finger-nose-finger and heel-to-shin bilaterally.  Gait and station: Gait is associated with a limping quality on the right leg. Tandem gait is minimally unsteady. Romberg is negative. No drift is seen. The patient is able to walk on heels and the toes bilaterally.  Reflexes: Deep tendon reflexes are symmetric and normal bilaterally. The ankle jerk reflexes are well-maintained bilaterally. Toes are downgoing bilaterally.   Assessment/Plan:   1. Low back pain, right leg pain  The patient does not appear to have evidence of nerve root compression by MRI of the lumbar spine. The patient does have some discomfort in the right hip with rotation, some tenderness over the right SI joint. The patient be sent for x-ray of the right hip, she will be set up for nerve conduction studies on both legs, EMG on the right leg. The patient may be sent for a right SI joint injection in the future if the above studies are unremarkable. She will be placed on gabapentin therapy at this time.  Julie Alexanders MD 05/20/2015 8:46 PM  [facility] [address] [place], [place] [address]  Phone [phone] Fax [phone]

## 2015-05-20 NOTE — Progress Notes (Signed)
Per [facility] metaxalone has been approved and letter to follow. Prior [id number]. I faxed to her ph [place]

## 2015-05-20 NOTE — Telephone Encounter (Signed)
I called the patient.  The x-rays of the pelvis and hips do not show significant arthritis. We will check EMG evaluation.

## 2015-06-01 ENCOUNTER — Encounter: Payer: Self-pay | Admitting: Hematology and Oncology

## 2015-06-01 NOTE — Progress Notes (Signed)
Per [facility] metaxalone has been approved 05/11/15-04/30/16  [phone]. I sent to medical records

## 2015-06-10 ENCOUNTER — Ambulatory Visit (INDEPENDENT_AMBULATORY_CARE_PROVIDER_SITE_OTHER): Payer: Medicare Other | Admitting: Neurology

## 2015-06-10 ENCOUNTER — Encounter: Payer: Self-pay | Admitting: Neurology

## 2015-06-10 ENCOUNTER — Ambulatory Visit (INDEPENDENT_AMBULATORY_CARE_PROVIDER_SITE_OTHER): Payer: Self-pay | Admitting: Neurology

## 2015-06-10 DIAGNOSIS — M25551 Pain in right hip: Secondary | ICD-10-CM

## 2015-06-10 DIAGNOSIS — M5417 Radiculopathy, lumbosacral region: Secondary | ICD-10-CM | POA: Diagnosis not present

## 2015-06-10 DIAGNOSIS — M5441 Lumbago with sciatica, right side: Secondary | ICD-10-CM

## 2015-06-10 DIAGNOSIS — G8929 Other chronic pain: Secondary | ICD-10-CM

## 2015-06-10 NOTE — Progress Notes (Signed)
[name] is a 75 year old patient comes in today for EMG and nerve conduction study evaluation. She has had chronic right hip and leg discomfort.  EMG and nerve conduction studies show evidence of a mild acute S1 radiculopathy on the right.  Apparently the patient had MRI evaluation of the lumbar spine that did not show definite nerve root compression, the patient will be sent for a lumbar myelogram with CT to follow. Depending upon the results of the above, the patient may require further blood work or a referral back to Dr. Sherwood Gambler.

## 2015-06-10 NOTE — Procedures (Signed)
     HISTORY:  [name] is a 75 year old patient with a history of right-sided buttock and right leg discomfort. The patient has had ongoing chronic pain since the fall of 2016. MRI evaluation of the low back has not shown definite nerve root compression. She comes in for evaluation of the chronic pain.  NERVE CONDUCTION STUDIES:  Nerve conduction studies were performed on both lower extremities. The distal motor latencies and motor amplitudes for the peroneal and posterior tibial nerves were within normal limits, with exception that the motor amplitudes for the peroneal nerves were low bilaterally. The nerve conduction velocities for these nerves were also normal. The H reflex latencies were normal. The sensory latencies for the peroneal nerves were within normal limits.   EMG STUDIES:  EMG study was performed on the right lower extremity:  The tibialis anterior muscle reveals 2 to 4K motor units with full recruitment. No fibrillations or positive waves were seen. The peroneus tertius muscle reveals 2 to 4K motor units with full recruitment. No fibrillations or positive waves were seen. The medial gastrocnemius muscle reveals 1 to 3K motor units with full recruitment. 1+ positive waves were seen. The vastus lateralis muscle reveals 2 to 4K motor units with full recruitment. No fibrillations or positive waves were seen. The tensor fascie latae muscle reveals 2 to 4K motor units with slightly decreased recruitment. 2+ positive waves were seen. The biceps femoris muscle (long head) reveals 2 to 4K motor units with decreased recruitment. 2+ fibrillations and positive waves were seen. The lumbosacral paraspinal muscles were tested at 3 levels, and revealed no abnormalities of insertional activity at the upper and middle levels tested. 2+ positive waves were seen at the lower level. There was good relaxation.   IMPRESSION:  Nerve conduction studies done on both lower extremities shows  evidence of low motor amplitudes for the peroneal nerves bilaterally, with sensory sparing. No definite peripheral neuropathy is seen. EMG evaluation of the right lower extremity is most consistent with a mild acute S1 radiculopathy. No other significant abnormalities were seen.  Jill Alexanders MD 06/10/2015 1:30 PM  [facility] [address] [place], [place] [address]  Phone [phone] Fax [phone]

## 2015-06-10 NOTE — Progress Notes (Signed)
Please refer to EMG and nerve conduction study procedure note. 

## 2015-06-21 ENCOUNTER — Ambulatory Visit
Admission: RE | Admit: 2015-06-21 | Discharge: 2015-06-21 | Disposition: A | Discharge status: Home / Self Care | Payer: Medicare Other | Source: Ambulatory Visit | Attending: Neurology | Admitting: Neurology

## 2015-06-21 ENCOUNTER — Other Ambulatory Visit: Payer: Self-pay | Admitting: Neurology

## 2015-06-21 ENCOUNTER — Telehealth: Payer: Self-pay | Admitting: Neurology

## 2015-06-21 DIAGNOSIS — G8929 Other chronic pain: Secondary | ICD-10-CM

## 2015-06-21 DIAGNOSIS — M5441 Lumbago with sciatica, right side: Secondary | ICD-10-CM

## 2015-06-21 DIAGNOSIS — M5417 Radiculopathy, lumbosacral region: Secondary | ICD-10-CM

## 2015-06-21 DIAGNOSIS — E538 Deficiency of other specified B group vitamins: Secondary | ICD-10-CM

## 2015-06-21 DIAGNOSIS — M25551 Pain in right hip: Principal | ICD-10-CM

## 2015-06-21 MED ORDER — IOHEXOL 180 MG/ML  SOLN
15.0000 mL | Freq: Once | INTRAMUSCULAR | Status: AC | PRN
Start: 2015-06-21 — End: 2015-06-21
  Administered 2015-06-21: 15 mL via INTRATHECAL

## 2015-06-21 MED ORDER — DIAZEPAM 5 MG PO TABS
5.0000 mg | ORAL_TABLET | Freq: Once | ORAL | Status: AC
  Administered 2015-06-21: 5 mg via ORAL

## 2015-06-21 NOTE — Discharge Instructions (Signed)
Myelogram Discharge Instructions  1. Go home and rest quietly for the next 24 hours.  It is important to lie flat for the next 24 hours.  Get up only to go to the restroom.  You may lie in the bed or on a couch on your back, your stomach, your left side or your right side.  You may have one pillow under your head.  You may have pillows between your knees while you are on your side or under your knees while you are on your back.  2. DO NOT drive today.  Recline the seat as far back as it will go, while still wearing your seat belt, on the way home.  3. You may get up to go to the bathroom as needed.  You may sit up for 10 minutes to eat.  You may resume your normal diet and medications unless otherwise indicated.  Drink lots of extra fluids today and tomorrow.  4. The incidence of headache, nausea, or vomiting is about 5% (one in 20 patients).  If you develop a headache, lie flat and drink plenty of fluids until the headache goes away.  Caffeinated beverages may be helpful.  If you develop severe nausea and vomiting or a headache that does not go away with flat bed rest, call [phone].  5. You may resume normal activities after your 24 hours of bed rest is over; however, do not exert yourself strongly or do any heavy lifting tomorrow. If when you get up you have a headache when standing, go back to bed and force fluids for another 24 hours.  6. Call your physician for a follow-up appointment.  The results of your myelogram will be sent directly to your physician by the following day.  7. If you have any questions or if complications develop after you arrive home, please call [phone].  Discharge instructions have been explained to the patient.  The patient, or the person responsible for the patient, fully understands these instructions.       May resume Sertraline on Feb. 21, 2017, after 9:30 am.

## 2015-06-21 NOTE — Telephone Encounter (Signed)
I called the patient. CT and myelogram did not show evidence of L5 or S1 nerve root impingement. I will set patient up for blood work looking for an etiology for the S1 nerve root denervation seen by EMG.   CT lumbar/Myelogram 06/21/15:  IMPRESSION: LUMBAR MYELOGRAM IMPRESSION:  No nerve root cut off or dynamic instability.  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 No spinal stenosis, subarticular zone narrowing, or foraminal narrowing of significance.  Specific attention was directed to RIGHT S1 nerve root where no compressive or intradural abnormality is seen

## 2015-06-21 NOTE — Progress Notes (Signed)
Patient states she has been off Sertaline for at least the past two days.  jkl

## 2015-06-24 ENCOUNTER — Other Ambulatory Visit (INDEPENDENT_AMBULATORY_CARE_PROVIDER_SITE_OTHER): Payer: Self-pay

## 2015-06-24 ENCOUNTER — Other Ambulatory Visit: Payer: Self-pay | Admitting: Neurology

## 2015-06-24 ENCOUNTER — Telehealth: Payer: Self-pay | Admitting: Neurology

## 2015-06-24 DIAGNOSIS — E538 Deficiency of other specified B group vitamins: Secondary | ICD-10-CM

## 2015-06-24 DIAGNOSIS — G587 Mononeuritis multiplex: Secondary | ICD-10-CM

## 2015-06-24 DIAGNOSIS — Z0289 Encounter for other administrative examinations: Secondary | ICD-10-CM

## 2015-06-24 NOTE — Telephone Encounter (Signed)
I will reorder blood work.

## 2015-06-28 ENCOUNTER — Telehealth: Payer: Self-pay | Admitting: Neurology

## 2015-06-28 DIAGNOSIS — R29898 Other symptoms and signs involving the musculoskeletal system: Secondary | ICD-10-CM

## 2015-06-28 NOTE — Telephone Encounter (Signed)
I called patient. The patient has not been contacted concerning the blood work because not all the blood work is back yet. I will call her when everything has been resulted. So far everything that has been resulted as normal.

## 2015-06-28 NOTE — Telephone Encounter (Signed)
Patient called to request lab results.

## 2015-06-28 NOTE — Telephone Encounter (Signed)
Pt returned Dr Jannifer Franklin call. Message relayed to patient. She will wait for completed labs to come in to receive a phone call with results.

## 2015-06-28 NOTE — Telephone Encounter (Signed)
I called the patient. The blood work is unremarkable. The patient is now having some discomfort in the left hip and leg as well as the right. She feels that both legs are somewhat weak. She recently has developed a cough and some mid back pain. She may need contact her primary care doctor concerning this. The patient will be sent for physical therapy for leg strengthening exercises.

## 2015-06-29 LAB — PAN-ANCA
ANCA Proteinase 3: <3.5 U/mL (ref 0.0–3.5)
Myeloperoxidase Ab: <9 U/mL (ref 0.0–9.0)

## 2015-06-29 LAB — B. BURGDORFI ANTIBODIES: Lyme IgG/IgM Ab: <0.91 {ISR} (ref 0.00–0.90)

## 2015-06-29 LAB — MULTIPLE MYELOMA PANEL, SERUM
ALBUMIN SERPL ELPH-MCNC: 3.8 g/dL (ref 2.9–4.4)
Albumin/Glob SerPl: 1.4 (ref 0.7–1.7)
Alpha 1: 0.2 g/dL (ref 0.0–0.4)
Alpha2 Glob SerPl Elph-Mcnc: 0.6 g/dL (ref 0.4–1.0)
B-Globulin SerPl Elph-Mcnc: 1.1 g/dL (ref 0.7–1.3)
GAMMA GLOB SERPL ELPH-MCNC: 1 g/dL (ref 0.4–1.8)
GLOBULIN, TOTAL: 2.8 g/dL (ref 2.2–3.9)
IGA/IMMUNOGLOBULIN A, SERUM: 94 mg/dL (ref 64–422)
IGM (IMMUNOGLOBULIN M), SRM: 62 mg/dL (ref 26–217)
IgG (Immunoglobin G), Serum: 948 mg/dL (ref 700–1600)
Total Protein: 6.6 g/dL (ref 6.0–8.5)

## 2015-06-29 LAB — SEDIMENTATION RATE: Sed Rate: 5 mm/hr (ref 0–40)

## 2015-06-29 LAB — VITAMIN B12: VITAMIN B 12: 500 pg/mL (ref 211–946)

## 2015-06-29 LAB — ANA W/REFLEX: ANA: NEGATIVE

## 2015-06-29 LAB — ANGIOTENSIN CONVERTING ENZYME: ANGIO CONVERT ENZYME: 56 U/L (ref 14–82)

## 2015-06-29 LAB — RHEUMATOID FACTOR

## 2015-07-15 MED ORDER — GABAPENTIN 300 MG PO CAPS: 300.0000 mg | ORAL_CAPSULE | Freq: Three times a day (TID) | ORAL | Status: DC

## 2015-07-15 NOTE — Addendum Note (Signed)
Addended by: Margette Fast on: 07/15/2015 12:35 PM   Modules accepted: Orders, Medications

## 2015-07-15 NOTE — Telephone Encounter (Signed)
I called patient. The patient has gained some benefit with the gabapentin, but every time she has physical therapy for back pain worsens. She may stop the physical therapy, we will increase the gabapentin taking 300 mg 3 times daily.

## 2015-07-15 NOTE — Telephone Encounter (Signed)
Pt called sts she's had PT 4 times, she was to go today but she is sick with a cold. Pt sts the pain in the lumbar and legs are worse. She said last night laying in the bed was painful. She thought the 1st day it was better, some soreness. The 3rd visit she starting having pain in the lumbar region. The gabapentin (NEURONTIN) 100 MG capsule has decreased the sharp pain to a "gnawing" pain. Next PT is 07/19/15. She is wanting to know if Dr Jannifer Franklin wants her to continue with PT.

## 2015-09-23 ENCOUNTER — Other Ambulatory Visit: Payer: Self-pay | Admitting: Physician Assistant

## 2015-09-23 DIAGNOSIS — R197 Diarrhea, unspecified: Secondary | ICD-10-CM

## 2015-09-24 ENCOUNTER — Ambulatory Visit
Admission: RE | Admit: 2015-09-24 | Discharge: 2015-09-24 | Disposition: A | Discharge status: Home / Self Care | Payer: Medicare Other | Source: Ambulatory Visit | Attending: Physician Assistant | Admitting: Physician Assistant

## 2015-09-24 DIAGNOSIS — R197 Diarrhea, unspecified: Secondary | ICD-10-CM | POA: Diagnosis not present

## 2015-09-29 ENCOUNTER — Other Ambulatory Visit
Admission: RE | Admit: 2015-09-29 | Discharge: 2015-09-29 | Disposition: A | Discharge status: Home / Self Care | Payer: Medicare Other | Source: Ambulatory Visit | Attending: Physician Assistant | Admitting: Physician Assistant

## 2015-09-29 DIAGNOSIS — Z029 Encounter for administrative examinations, unspecified: Secondary | ICD-10-CM | POA: Diagnosis present

## 2015-09-29 LAB — GASTROINTESTINAL PANEL BY PCR, STOOL (REPLACES STOOL CULTURE)
Adenovirus F40/41: NOT DETECTED
Astrovirus: NOT DETECTED
CRYPTOSPORIDIUM: NOT DETECTED
Campylobacter species: NOT DETECTED
Cyclospora cayetanensis: NOT DETECTED
E. coli O157: NOT DETECTED
ENTAMOEBA HISTOLYTICA: NOT DETECTED
ENTEROAGGREGATIVE E COLI (EAEC): NOT DETECTED
Enteropathogenic E coli (EPEC): NOT DETECTED
Enterotoxigenic E coli (ETEC): NOT DETECTED
GIARDIA LAMBLIA: NOT DETECTED
Norovirus GI/GII: NOT DETECTED
PLESIMONAS SHIGELLOIDES: NOT DETECTED
Rotavirus A: NOT DETECTED
SALMONELLA SPECIES: NOT DETECTED
SHIGELLA/ENTEROINVASIVE E COLI (EIEC): NOT DETECTED
Sapovirus (I, II, IV, and V): NOT DETECTED
Shiga like toxin producing E coli (STEC): NOT DETECTED
VIBRIO CHOLERAE: NOT DETECTED
Vibrio species: NOT DETECTED
YERSINIA ENTEROCOLITICA: NOT DETECTED

## 2015-10-01 ENCOUNTER — Ambulatory Visit (INDEPENDENT_AMBULATORY_CARE_PROVIDER_SITE_OTHER): Payer: Medicare Other | Admitting: Urology

## 2015-10-01 ENCOUNTER — Encounter: Payer: Self-pay | Admitting: Urology

## 2015-10-01 VITALS — BP 117/73 | HR 65 | Ht 68.0 in | Wt 181.4 lb

## 2015-10-01 DIAGNOSIS — N200 Calculus of kidney: Secondary | ICD-10-CM

## 2015-10-01 DIAGNOSIS — R3129 Other microscopic hematuria: Secondary | ICD-10-CM | POA: Diagnosis not present

## 2015-10-01 NOTE — Progress Notes (Signed)
10/01/2015 9:07 AM   [name] 1940-12-22 [id number]  Referring provider: [name], MD [facility] [place], [place] [address]  Chief Complaint  Patient presents with  . Follow-up    possible kidney stone, R side flank pain     HPI: The patient is a 75 year old female with possible history of microhematuria presents for kidney stones seen on a recent renal ultrasound. However, she had formal hematuria workup in December 2016/January 2017 which was completely normal including no nephrolithiasis. She has no changes in her status since we last saw her. No other complaints other than pain over her right ribs.   PMH: Past Medical History  Diagnosis Date  . Anxiety   . Asthma   . Hyperlipidemia   . Fibrocystic disease of breast   . Hiatal hernia   . GERD (gastroesophageal reflux disease)   . Psoriasis   . Osteoporosis   . Depression   . Renal stone   . Peptic ulcer disease   . 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 Chronic migraine without aura 03/13/2014    Last Assessment & Plan:  Headaches stable generally   . Syncope and collapse 10/29/2009    Qualifier: Diagnosis of  By: Rockey Situ MD, Tim    . Acute diarrhea 04/08/2014    Surgical History: Past Surgical History  Procedure Laterality Date  . Vesicovaginal fistula closure w/ tah    . Appendectomy    . Breast biopsy    . Partial hysterectomy    . Hernia repair      Home Medications:    Medication List       This list is accurate as of: 10/01/15  9:07 AM.  Always use your most recent med list.               gabapentin 300 MG capsule  Commonly known as:  NEURONTIN  Take 1 capsule (300 mg total) by mouth 3 (three) times daily.     ibuprofen 200 MG tablet  Commonly known as:  ADVIL,MOTRIN  Take 200 mg by mouth 4 (four) times daily. Reported on 10/01/2015     Iron Polysacch Cmplx-B12-FA 150-0.025-1 MG Caps  Take by mouth.     LORazepam 0.5 MG tablet  Commonly  known as:  ATIVAN  Take 0.5 mg by mouth 2 (two) times daily.     pantoprazole 40 MG tablet  Commonly known as:  PROTONIX  Take by mouth.     pravastatin 80 MG tablet  Commonly known as:  PRAVACHOL     sertraline 50 MG tablet  Commonly known as:  ZOLOFT     sucralfate 1 g tablet  Commonly known as:  CARAFATE  Take by mouth.        Allergies: No Known Allergies  Family History: Family History  Problem Relation Age of Onset  . Coronary artery disease Other   . Hematuria Father   . Congestive Heart Failure Father   . Dementia Mother   . Congestive Heart Failure Mother   . Neuropathy Brother     Social History:  reports that she has never smoked. She has never used smokeless tobacco. She reports that she does not drink alcohol or use illicit drugs.  ROS: UROLOGY Frequent Urination?: Yes Hard to postpone urination?: Yes Burning/pain with urination?: Yes Get up at night to urinate?: Yes Leakage of urine?: Yes Urine stream starts and stops?: No Trouble starting stream?: No Do you have  to strain to urinate?: No Blood in urine?: No Urinary tract infection?: No Sexually transmitted disease?: No Injury to kidneys or bladder?: No Painful intercourse?: No Weak stream?: No Currently pregnant?: No Vaginal bleeding?: No Last menstrual period?: No  Gastrointestinal Nausea?: Yes Vomiting?: No Indigestion/heartburn?: No Diarrhea?: No Constipation?: No  Constitutional Fever: No Night sweats?: No Weight loss?: Yes Fatigue?: Yes  Skin Skin rash/lesions?: Yes Itching?: Yes  Eyes Blurred vision?: No Double vision?: No  Ears/Nose/Throat Sore throat?: No Sinus problems?: Yes  Hematologic/Lymphatic Swollen glands?: No Easy bruising?: No  Cardiovascular Leg swelling?: Yes Chest pain?: No  Respiratory Cough?: Yes Shortness of breath?: No  Endocrine Excessive thirst?: No  Musculoskeletal Back pain?: Yes Joint pain?: No  Neurological Headaches?:  No Dizziness?: Yes  Psychologic Depression?: No Anxiety?: No  Physical Exam: BP 117/73 mmHg  Pulse 65  Ht 5\' 8"  (1.727 m)  Wt 181 lb 6.4 oz (82.283 kg)  BMI 27.59 kg/m2  Constitutional:  Alert and oriented, No acute distress. HEENT: [place] AT, moist mucus membranes.  Trachea midline, no masses. Cardiovascular: No clubbing, cyanosis, or edema. Respiratory: Normal respiratory effort, no increased work of breathing. GI: Abdomen is soft, nontender, nondistended, no abdominal masses GU: No CVA tenderness.  Skin: No rashes, bruises or suspicious lesions. Lymph: No cervical or inguinal adenopathy. Neurologic: Grossly intact, no focal deficits, moving all 4 extremities. Psychiatric: Normal mood and affect.  Laboratory Data: Lab Results  Component Value Date   WBC 9.0 04/30/2015   HGB 13.0 04/30/2015   HCT 37.7 04/30/2015   MCV 89.1 04/30/2015   PLT 142* 04/30/2015    Lab Results  Component Value Date   CREATININE 0.8 04/30/2015    No results found for: PSA  No results found for: TESTOSTERONE  No results found for: HGBA1C  Urinalysis    Component Value Date/Time   APPEARANCEUR Clear 05/06/2015 1114   GLUCOSEU Negative 05/06/2015 1114   BILIRUBINUR Negative 05/06/2015 1114   PROTEINUR 2+* 05/06/2015 1114   NITRITE Negative 05/06/2015 1114   LEUKOCYTESUR Negative 05/06/2015 1114    Pertinent Imaging: On review of her ultrasound, the area that was suspicious to the radiologist for stone is clearly not a stone.  Assessment & Plan:   1. Microscopic hematuria The patient does not have nephrolithiasis. Not only did she have a negative CTs scan in December 2016 which is the gold standard for diagnosing nephrolithiasis, the area of concern on the renal ultrasound is within the renal parenchyma and not the collecting system as you would expect for a stone. She does not have nephrolithiasis at this time. He is due back for her annual urinalysis in 6 months. We will see her at  that time to ensure hematuria is resolved.  Return in about 6 months (around 04/01/2016).  Nickie Retort, MD  [facility] [address], [place], [place] [address] [phone]

## 2015-10-07 LAB — LACTOFERRIN, FECAL, QUANT.: Lactoferrin, Fecal, Quant.: <1 ug/mL(g) (ref 0.00–7.24)

## 2015-10-07 LAB — PANCREATIC ELASTASE, FECAL: Pancreatic Elastase-1, Stool: >500 ug Elast./g (ref >200)

## 2015-11-10 LAB — MICROSCOPIC EXAMINATION: BACTERIA UA: NONE SEEN

## 2015-11-10 LAB — URINALYSIS, COMPLETE
Bilirubin, UA: NEGATIVE
GLUCOSE, UA: NEGATIVE
KETONES UA: NEGATIVE
Nitrite, UA: NEGATIVE
PROTEIN UA: NEGATIVE
Urobilinogen, Ur: 0.2 mg/dL (ref 0.2–1.0)
pH, UA: 5.5 (ref 5.0–7.5)

## 2015-11-25 ENCOUNTER — Emergency Department
Admission: EM | Admit: 2015-11-25 | Discharge: 2015-11-25 | Disposition: A | Discharge status: Home / Self Care | Payer: Medicare Other | Attending: Emergency Medicine | Admitting: Emergency Medicine

## 2015-11-25 ENCOUNTER — Encounter: Payer: Self-pay | Admitting: Emergency Medicine

## 2015-11-25 ENCOUNTER — Ambulatory Visit: Payer: Medicare Other | Admitting: Podiatry

## 2015-11-25 DIAGNOSIS — J45909 Unspecified asthma, uncomplicated: Secondary | ICD-10-CM | POA: Diagnosis not present

## 2015-11-25 DIAGNOSIS — Z86010 Personal history of colonic polyps: Secondary | ICD-10-CM | POA: Diagnosis not present

## 2015-11-25 DIAGNOSIS — E785 Hyperlipidemia, unspecified: Secondary | ICD-10-CM | POA: Insufficient documentation

## 2015-11-25 DIAGNOSIS — R55 Syncope and collapse: Secondary | ICD-10-CM

## 2015-11-25 DIAGNOSIS — N390 Urinary tract infection, site not specified: Secondary | ICD-10-CM | POA: Diagnosis not present

## 2015-11-25 DIAGNOSIS — Z79899 Other long term (current) drug therapy: Secondary | ICD-10-CM | POA: Diagnosis not present

## 2015-11-25 DIAGNOSIS — Z7951 Long term (current) use of inhaled steroids: Secondary | ICD-10-CM | POA: Insufficient documentation

## 2015-11-25 LAB — CBC WITH DIFFERENTIAL/PLATELET
BASOS ABS: 0 10*3/uL (ref 0–0.1)
Basophils Relative: 0 %
EOS ABS: 0.2 10*3/uL (ref 0–0.7)
Eosinophils Relative: 2 %
HCT: 33.9 % — ABNORMAL LOW (ref 35.0–47.0)
Hemoglobin: 12 g/dL (ref 12.0–16.0)
LYMPHS ABS: 5.5 10*3/uL — AB (ref 1.0–3.6)
Lymphocytes Relative: 61 %
MCH: 31.9 pg (ref 26.0–34.0)
MCHC: 35.2 g/dL (ref 32.0–36.0)
MCV: 90.4 fL (ref 80.0–100.0)
MONO ABS: 0.4 10*3/uL (ref 0.2–0.9)
Monocytes Relative: 5 %
Neutro Abs: 2.8 10*3/uL (ref 1.4–6.5)
Neutrophils Relative %: 32 %
PLATELETS: 110 10*3/uL — AB (ref 150–440)
RBC: 3.75 MIL/uL — AB (ref 3.80–5.20)
RDW: 15.7 % — ABNORMAL HIGH (ref 11.5–14.5)
WBC: 8.9 10*3/uL (ref 3.6–11.0)

## 2015-11-25 LAB — BASIC METABOLIC PANEL
ANION GAP: 5 (ref 5–15)
BUN: 17 mg/dL (ref 6–20)
CO2: 25 mmol/L (ref 22–32)
Calcium: 9.2 mg/dL (ref 8.9–10.3)
Chloride: 110 mmol/L (ref 101–111)
Creatinine, Ser: 0.91 mg/dL (ref 0.44–1.00)
GFR calc Af Amer: >60 mL/min (ref >60)
GFR calc non Af Amer: 59 mL/min — ABNORMAL LOW (ref >60)
Glucose, Bld: 107 mg/dL — ABNORMAL HIGH (ref 65–99)
POTASSIUM: 4 mmol/L (ref 3.5–5.1)
SODIUM: 140 mmol/L (ref 135–145)

## 2015-11-25 LAB — URINALYSIS COMPLETE WITH MICROSCOPIC (ARMC ONLY)
BILIRUBIN URINE: NEGATIVE
GLUCOSE, UA: NEGATIVE mg/dL
HGB URINE DIPSTICK: NEGATIVE
KETONES UR: NEGATIVE mg/dL
NITRITE: NEGATIVE
Protein, ur: NEGATIVE mg/dL
SPECIFIC GRAVITY, URINE: 1.014 (ref 1.005–1.030)
pH: 6 (ref 5.0–8.0)

## 2015-11-25 LAB — TROPONIN I
Troponin I: <0.03 ng/mL (ref <0.03)
Troponin I: <0.03 ng/mL (ref <0.03)

## 2015-11-25 MED ORDER — CEPHALEXIN 500 MG PO CAPS
500.0000 mg | ORAL_CAPSULE | Freq: Three times a day (TID) | ORAL | 0 refills | Status: AC
Start: 2015-11-25 — End: 2015-12-05

## 2015-11-25 MED ORDER — CEPHALEXIN 500 MG PO CAPS: 500.0000 mg | ORAL_CAPSULE | Freq: Once | ORAL | Status: DC

## 2015-11-25 NOTE — ED Triage Notes (Signed)
Pt arrived via EMS from home for reports of generalized weakness. Pt reports she has not been sleeping well lately because she is taking care of her elderly mother. EMS reports 110/70, CBG 130. EMS administered Zofran 4 mg IV for nausea.

## 2015-11-25 NOTE — ED Notes (Signed)
NAD noted at time of D/C. Pt taken to the lobby via wheelchair at this time. Denies comments/concernst at this time.

## 2015-11-25 NOTE — ED Provider Notes (Signed)
[facility] Emergency Department Provider Note   ____________________________________________   First MD Initiated Contact with Patient 11/25/15 0805     (approximate)  I have reviewed the triage vital signs and the nursing notes.   HISTORY  Chief Complaint Weakness   HPI [name] is a 75 y.o. female with a history of asthma as well as hyperlipidemia and syncope who is presenting after syncopal episode today. She said that she's been taking care of her mother over the past 3 months and that she has been very tired because of minimal sleep. She is concerned because she has become very weak over the last 3 months. This morning, she said that she hurt her mother get up and it caused her to jump up out of bed from sleep to go catch her mother to make sure that she didn't fall. The patient said that she got up so quickly that she began to feel sweaty and tingly. She then passed out. She denies any chest pain or shortness of breath.   Past Medical History:  Diagnosis Date  . Acute diarrhea 04/08/2014  . Anxiety   . Asthma   . 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 Chronic migraine without aura 03/13/2014   Last Assessment & Plan:  Headaches stable generally   . Depression   . Fibrocystic disease of breast   . GERD (gastroesophageal reflux disease)   . Hiatal hernia   . Hyperlipidemia   . Osteoporosis   . Peptic ulcer disease   . Psoriasis   . Renal stone   . Syncope and collapse 10/29/2009   Qualifier: Diagnosis of  By: Rockey Situ MD, Tim      Patient Active Problem List   Diagnosis Date Noted  . Low back pain 04/28/2015  . H/O adenomatous polyp of colon 04/30/2014  . Abdominal pain, epigastric 04/08/2014  . Acute diarrhea 04/08/2014  . Blood in feces 04/08/2014  . Chronic migraine without aura 03/13/2014  . 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 Hypercholesterolemia 02/05/2014  . Block, bundle branch, left  02/05/2014  . SYNCOPE AND COLLAPSE 10/29/2009  . CHEST PAIN UNSPECIFIED 10/29/2009    Past Surgical History:  Procedure Laterality Date  . APPENDECTOMY    . BREAST BIOPSY    . HERNIA REPAIR    . PARTIAL HYSTERECTOMY    . VESICOVAGINAL FISTULA CLOSURE W/ TAH      Prior to Admission medications   Medication Sig Start Date End Date Taking? Authorizing Provider  ferrous sulfate 325 (65 FE) MG tablet Take 325 mg by mouth at bedtime.   Yes Historical Provider, MD  Fluticasone-Salmeterol (ADVAIR) 100-50 MCG/DOSE AEPB Inhale 1 puff into the lungs as needed.   Yes Historical Provider, MD  LORazepam (ATIVAN) 0.5 MG tablet Take 0.5 mg by mouth at bedtime.    Yes Historical Provider, MD  Multiple Vitamins-Minerals (CENTRUM WOMEN) TABS Take 1 tablet by mouth at bedtime.   Yes Historical Provider, MD  pantoprazole (PROTONIX) 40 MG tablet Take 40 mg by mouth 2 (two) times daily.  05/28/15  Yes Historical Provider, MD  pravastatin (PRAVACHOL) 80 MG tablet  07/26/15  Yes Historical Provider, MD  sertraline (ZOLOFT) 50 MG tablet  06/01/15  Yes Historical Provider, MD  gabapentin (NEURONTIN) 300 MG capsule Take 1 capsule (300 mg total) by mouth 3 (three) times daily. Patient not taking: Reported on 10/01/2015 07/15/15   Kathrynn Ducking, MD  sucralfate (CARAFATE) 1 g tablet Take  by mouth. 09/23/15 09/22/16  Historical Provider, MD    Allergies Review of patient's allergies indicates no known allergies.  Family History  Problem Relation Age of Onset  . Hematuria Father   . Congestive Heart Failure Father   . Dementia Mother   . Congestive Heart Failure Mother   . Neuropathy Brother   . Coronary artery disease Other     Social History Social History  Substance Use Topics  . Smoking status: Never Smoker  . Smokeless tobacco: Never Used  . Alcohol use No    Review of Systems Constitutional: No fever/chills Eyes: No visual changes. ENT: No sore throat. Cardiovascular: Denies chest  pain. Respiratory: Denies shortness of breath. Gastrointestinal: No abdominal pain.  No nausea, no vomiting.  No diarrhea.  No constipation. Genitourinary: Negative for dysuria. Musculoskeletal: Negative for back pain. Skin: Negative for rash. Neurological: Negative for headaches, focal weakness or numbness.  10-point ROS otherwise negative.  ____________________________________________   PHYSICAL EXAM:  VITAL SIGNS: ED Triage Vitals  Enc Vitals Group     BP      Pulse      Resp      Temp      Temp src      SpO2      Weight      Height      Head Circumference      Peak Flow      Pain Score      Pain Loc      Pain Edu?      Excl. in [place]?     Constitutional: Alert and oriented. Well appearing and in no acute distress. Eyes: Conjunctivae are normal. PERRL. EOMI. Head: Atraumatic. Nose: No congestion/rhinnorhea. Mouth/Throat: Mucous membranes are moist.   Neck: No stridor.   Cardiovascular: Normal rate, regular rhythm. Grossly normal heart sounds.   Respiratory: Normal respiratory effort.  No retractions. Lungs CTAB. Gastrointestinal: Soft and nontender. No distention. Musculoskeletal: No lower extremity tenderness nor edema.  No joint effusions. Neurologic:  Normal speech and language. No gross focal neurologic deficits are appreciated.  Skin:  Skin is warm, dry and intact. No rash noted. Psychiatric: Mood and affect are normal. Speech and behavior are normal.  ____________________________________________   LABS (all labs ordered are listed, but only abnormal results are displayed)  Labs Reviewed  CBC WITH DIFFERENTIAL/PLATELET - Abnormal; Notable for the following:       Result Value   RBC 3.75 (*)    HCT 33.9 (*)    RDW 15.7 (*)    Platelets 110 (*)    Lymphs Abs 5.5 (*)    All other components within normal limits  BASIC METABOLIC PANEL - Abnormal; Notable for the following:    Glucose, Bld 107 (*)    GFR calc non Af Amer 59 (*)    All other components  within normal limits  URINALYSIS COMPLETEWITH MICROSCOPIC ([facility] ONLY) - Abnormal; Notable for the following:    Color, Urine YELLOW (*)    APPearance CLEAR (*)    Leukocytes, UA 2+ (*)    Bacteria, UA RARE (*)    Squamous Epithelial / LPF 0-5 (*)    All other components within normal limits  URINE CULTURE  TROPONIN I  TROPONIN I   ____________________________________________  EKG  ED ECG REPORT I, Doran Stabler, the attending physician, personally viewed and interpreted this ECG.   Date: 11/25/2015  EKG Time: 802  Rate: 53  Rhythm: sinus bradycardia  Axis: Normal  Intervals:left bundle branch block  ST&T Change: No abnormal ST elevation or depression. ST segments are consistent with a left bundle branch block. No abnormal T-wave inversions. Left bundle branch block pattern on the EKG which is new from previous EKGs on the record. However, there is a note from Dr. Ubaldo Glassing of cardiology from 2015 notes a left bundle branch block which is chronic.  ____________________________________________  RADIOLOGY   ____________________________________________   PROCEDURES   Procedures   ____________________________________________   INITIAL IMPRESSION / ASSESSMENT AND PLAN / ED COURSE  Pertinent labs & imaging results that were available during my care of the patient were reviewed by me and considered in my medical decision making (see chart for details).  ----------------------------------------- 12:26 PM on 11/25/2015 -----------------------------------------  Patient resting comfortably and eating a sandwich. Very reassuring labs and story is consistent with vasovagal episode. The patient says that she is only getting about 3 hours of sleep at night because she is taking care of her 44 year old mother who has dementia. The patient did have some evidence of urinary tract infection on her urine I will treat her with antibiotics. Otherwise I believe she will be cleared  for discharge and will follow up with her primary care doctor. I explain the results as well as the plan with her and she is understanding and willing to comply.  Clinical Course     ____________________________________________   FINAL CLINICAL IMPRESSION(S) / ED DIAGNOSES  Vasovagal syncope. UTI.    NEW MEDICATIONS STARTED DURING THIS VISIT:  New Prescriptions   No medications on file     Note:  This document was prepared using Dragon voice recognition software and may include unintentional dictation errors.    Orbie Pyo, MD 11/25/15 1226

## 2015-11-25 NOTE — ED Notes (Signed)
Received report from [facility], care assumed.  Pt resting in bed,  Vss, blood drawn for troponin and urine sample sent to lab.  Pt resting with family at bedside.

## 2015-11-26 LAB — URINE CULTURE

## 2015-12-07 ENCOUNTER — Ambulatory Visit (INDEPENDENT_AMBULATORY_CARE_PROVIDER_SITE_OTHER): Payer: Medicare Other

## 2015-12-07 ENCOUNTER — Encounter: Payer: Self-pay | Admitting: Podiatry

## 2015-12-07 ENCOUNTER — Ambulatory Visit (INDEPENDENT_AMBULATORY_CARE_PROVIDER_SITE_OTHER): Payer: Medicare Other | Admitting: Podiatry

## 2015-12-07 VITALS — BP 155/85 | HR 85 | Resp 18

## 2015-12-07 DIAGNOSIS — M79672 Pain in left foot: Secondary | ICD-10-CM | POA: Diagnosis not present

## 2015-12-07 DIAGNOSIS — L603 Nail dystrophy: Secondary | ICD-10-CM

## 2015-12-07 DIAGNOSIS — T148XXA Other injury of unspecified body region, initial encounter: Secondary | ICD-10-CM

## 2015-12-07 DIAGNOSIS — T148 Other injury of unspecified body region: Secondary | ICD-10-CM

## 2015-12-07 DIAGNOSIS — S92912A Unspecified fracture of left toe(s), initial encounter for closed fracture: Secondary | ICD-10-CM | POA: Diagnosis not present

## 2015-12-07 NOTE — Progress Notes (Signed)
   Subjective:    Patient ID: [name], female    DOB: January 04, 1941, 75 y.o.   MRN: [id number]  HPI  75 year old female presents the also concerns of her left big toe for which she has some discomfort. She states that the toenail fell off as it was fungus previously and then she dropped a can of beans undertow back in March. Since then she's had some discomfort of the toe. She points along the proximal phalanx of the left big toe she has pain and she is having no pain around the toenail although she does get some discomfort at times but not currently. She also states that she stubbed her left fifth toe the day and the toe is been swollen and she is concerned that she broke it. She also has injured her left ankle she is under the active care of Weston Anna for her left ankle. She was scheduled for an MRI for left ankle however she had to cancel that as she she passed out that morning and she did not go.  Review of Systems  All other systems reviewed and are negative.      Objective:   Physical Exam General: AAO x3, NAD  Dermatological: Left hallux nails, dystrophic, discolored hypertrophic on the lateral nail border. There is hyperkeratotic tissue on the medial nail border with the nail previously fallen off. There is no tenderness palpation to the nail or nail bed side at this time. There is no significant incurvation. There is no drainage or pus. No edema, erythema. No open lesions or pre-ulcer lesions identified at this time.  Vascular: Dorsalis Pedis artery and Posterior Tibial artery pedal pulses are 2/4 bilateral with immedate capillary fill time.  There is no pain with calf compression, swelling, warmth, erythema.   Neruologic: Grossly intact via light touch bilateral. Vibratory intact via tuning fork bilateral. Protective threshold with Semmes Wienstein monofilament intact to all pedal sites bilateral.    Musculoskeletal: There is tenderness the proximal pharynx of the left hallux  how there is no overlying edema, erythema, increase in warmth. There is tenderness of the left fifth toe there is mild edema to this area. There is no other areas of tenderness to the left or right foot. MMT 5/5, ROM WNL except for decreased ROM of the left 1st MTPJ and there is mild crepitation with ROM but no pain.   Gait: Unassisted, Nonantalgic.      Assessment & Plan:  75 year old female left fifth toe fracture left hallux contusion with onychodystrophy -Treatment options discussed including all alternatives, risks, and complications -Etiology of symptoms were discussed -X-rays were obtained and reviewed with the patient. Fracture left fifth toe. Other evidence of acute fracture this time. -She is difficult to wearing shoes to the left fifth toe. She has a cam walker at home to her ankle. Recommend her to wear this. -I discussed total nail avulsion left hallux however her mom is in hospice care and will likely pass away today and she wished about any procedure. Nail was debrided today. -I'll see her back next couple weeks or sooner if any issues are to arise. Call any questions or concerns meantime.  Celesta Gentile, DPM

## 2015-12-28 ENCOUNTER — Ambulatory Visit: Payer: Medicare Other | Admitting: Podiatry

## 2016-02-11 ENCOUNTER — Other Ambulatory Visit: Payer: Self-pay | Admitting: Obstetrics and Gynecology

## 2016-02-11 DIAGNOSIS — N643 Galactorrhea not associated with childbirth: Secondary | ICD-10-CM

## 2016-03-03 ENCOUNTER — Ambulatory Visit
Admission: RE | Admit: 2016-03-03 | Discharge: 2016-03-03 | Disposition: A | Discharge status: Home / Self Care | Payer: Medicare Other | Source: Ambulatory Visit | Attending: Obstetrics and Gynecology | Admitting: Obstetrics and Gynecology

## 2016-03-03 DIAGNOSIS — N643 Galactorrhea not associated with childbirth: Secondary | ICD-10-CM | POA: Diagnosis not present

## 2016-03-03 DIAGNOSIS — R599 Enlarged lymph nodes, unspecified: Secondary | ICD-10-CM | POA: Diagnosis not present

## 2016-03-06 ENCOUNTER — Other Ambulatory Visit: Payer: Self-pay | Admitting: Obstetrics and Gynecology

## 2016-03-06 DIAGNOSIS — R928 Other abnormal and inconclusive findings on diagnostic imaging of breast: Secondary | ICD-10-CM

## 2016-03-09 ENCOUNTER — Ambulatory Visit (INDEPENDENT_AMBULATORY_CARE_PROVIDER_SITE_OTHER): Payer: Medicare Other | Admitting: Podiatry

## 2016-03-09 ENCOUNTER — Encounter: Payer: Self-pay | Admitting: Podiatry

## 2016-03-09 ENCOUNTER — Ambulatory Visit (INDEPENDENT_AMBULATORY_CARE_PROVIDER_SITE_OTHER): Payer: Medicare Other

## 2016-03-09 VITALS — BP 124/96 | HR 67 | Resp 16

## 2016-03-09 DIAGNOSIS — M779 Enthesopathy, unspecified: Secondary | ICD-10-CM | POA: Diagnosis not present

## 2016-03-09 DIAGNOSIS — M2041 Other hammer toe(s) (acquired), right foot: Secondary | ICD-10-CM

## 2016-03-09 DIAGNOSIS — D169 Benign neoplasm of bone and articular cartilage, unspecified: Secondary | ICD-10-CM | POA: Diagnosis not present

## 2016-03-09 DIAGNOSIS — M79671 Pain in right foot: Secondary | ICD-10-CM | POA: Diagnosis not present

## 2016-03-09 MED ORDER — TRIAMCINOLONE ACETONIDE 10 MG/ML IJ SUSP
10.0000 mg | Freq: Once | INTRAMUSCULAR | Status: AC
  Administered 2016-03-09: 10 mg

## 2016-03-09 NOTE — Progress Notes (Signed)
Subjective:     Patient ID: [name], female   DOB: 11/03/1940, 75 y.o.   MRN: [id number]  HPI patient states she's developed spur on the medial side of her right big toe that's increasingly sore and that she developed pain in the bottom of her right foot with a hammertoe deformity second right that's becoming increasingly rigid. Patient states that she tried to wear different shoes she's tried to pad it and tried to soak   Review of Systems     Objective:   Physical Exam Neurovascular status intact muscle strength adequate with patient found to have elevation of the second digit right with relatively rigid contracture and osteotic lesion on the medial side right big toe with redness and pain when palpated. 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 We discussed removing the spur on the right big toe which will be necessary but I want to see what we have to do with the second toe with possible fusion and shortening osteotomy depending on response  X-ray report indicates that there is rigid contracture digit 2 right with a spur on the right big toe medial side

## 2016-03-15 ENCOUNTER — Ambulatory Visit
Admission: RE | Admit: 2016-03-15 | Discharge: 2016-03-15 | Disposition: A | Discharge status: Home / Self Care | Payer: Medicare Other | Source: Ambulatory Visit | Attending: Obstetrics and Gynecology | Admitting: Obstetrics and Gynecology

## 2016-03-15 DIAGNOSIS — R928 Other abnormal and inconclusive findings on diagnostic imaging of breast: Secondary | ICD-10-CM

## 2016-03-15 DIAGNOSIS — C8594 Non-Hodgkin lymphoma, unspecified, lymph nodes of axilla and upper limb: Secondary | ICD-10-CM | POA: Diagnosis not present

## 2016-03-15 DIAGNOSIS — C9110 Chronic lymphocytic leukemia of B-cell type not having achieved remission: Secondary | ICD-10-CM | POA: Insufficient documentation

## 2016-03-15 HISTORY — PX: BREAST BIOPSY: SHX20

## 2016-03-17 LAB — SURGICAL PATHOLOGY

## 2016-03-21 ENCOUNTER — Telehealth: Payer: Self-pay | Admitting: *Deleted

## 2016-03-21 NOTE — Telephone Encounter (Signed)
Per request from Dr. Enriqueta Shutter and Lars Pinks, CNM, I have called and scheduled the patient for a medical oncology consult with Dr. Mike Gip on 03/31/16 @ 11:30.  Encouraged the patient to bring someone with her to the appointment.  Notified both providers of her appointment.

## 2016-03-31 ENCOUNTER — Inpatient Hospital Stay: Payer: Medicare Other

## 2016-03-31 ENCOUNTER — Encounter: Payer: Self-pay | Admitting: Hematology and Oncology

## 2016-03-31 ENCOUNTER — Inpatient Hospital Stay: Payer: Medicare Other | Attending: Hematology and Oncology | Admitting: Hematology and Oncology

## 2016-03-31 VITALS — BP 125/81 | HR 77 | Temp 96.1°F | Resp 18 | Ht 67.32 in | Wt 183.0 lb

## 2016-03-31 DIAGNOSIS — K219 Gastro-esophageal reflux disease without esophagitis: Secondary | ICD-10-CM | POA: Diagnosis not present

## 2016-03-31 DIAGNOSIS — R32 Unspecified urinary incontinence: Secondary | ICD-10-CM | POA: Insufficient documentation

## 2016-03-31 DIAGNOSIS — D696 Thrombocytopenia, unspecified: Secondary | ICD-10-CM | POA: Insufficient documentation

## 2016-03-31 DIAGNOSIS — Z87442 Personal history of urinary calculi: Secondary | ICD-10-CM | POA: Insufficient documentation

## 2016-03-31 DIAGNOSIS — C9110 Chronic lymphocytic leukemia of B-cell type not having achieved remission: Secondary | ICD-10-CM

## 2016-03-31 DIAGNOSIS — Z79899 Other long term (current) drug therapy: Secondary | ICD-10-CM | POA: Diagnosis not present

## 2016-03-31 DIAGNOSIS — Z803 Family history of malignant neoplasm of breast: Secondary | ICD-10-CM | POA: Insufficient documentation

## 2016-03-31 DIAGNOSIS — J45909 Unspecified asthma, uncomplicated: Secondary | ICD-10-CM | POA: Diagnosis not present

## 2016-03-31 DIAGNOSIS — K449 Diaphragmatic hernia without obstruction or gangrene: Secondary | ICD-10-CM | POA: Diagnosis not present

## 2016-03-31 DIAGNOSIS — F419 Anxiety disorder, unspecified: Secondary | ICD-10-CM

## 2016-03-31 DIAGNOSIS — K5730 Diverticulosis of large intestine without perforation or abscess without bleeding: Secondary | ICD-10-CM | POA: Insufficient documentation

## 2016-03-31 DIAGNOSIS — E785 Hyperlipidemia, unspecified: Secondary | ICD-10-CM | POA: Insufficient documentation

## 2016-03-31 DIAGNOSIS — R531 Weakness: Secondary | ICD-10-CM | POA: Diagnosis not present

## 2016-03-31 DIAGNOSIS — R61 Generalized hyperhidrosis: Secondary | ICD-10-CM | POA: Diagnosis not present

## 2016-03-31 DIAGNOSIS — R51 Headache: Secondary | ICD-10-CM | POA: Diagnosis not present

## 2016-03-31 DIAGNOSIS — R161 Splenomegaly, not elsewhere classified: Secondary | ICD-10-CM | POA: Insufficient documentation

## 2016-03-31 DIAGNOSIS — R590 Localized enlarged lymph nodes: Secondary | ICD-10-CM | POA: Insufficient documentation

## 2016-03-31 DIAGNOSIS — R6881 Early satiety: Secondary | ICD-10-CM | POA: Insufficient documentation

## 2016-03-31 DIAGNOSIS — D649 Anemia, unspecified: Secondary | ICD-10-CM

## 2016-03-31 LAB — LACTATE DEHYDROGENASE: LDH: 138 U/L (ref 98–192)

## 2016-03-31 LAB — IRON AND TIBC
Iron: 54 ug/dL (ref 28–170)
Saturation Ratios: 16 % (ref 10.4–31.8)
TIBC: 337 ug/dL (ref 250–450)
UIBC: 283 ug/dL

## 2016-03-31 LAB — CBC WITH DIFFERENTIAL/PLATELET
Basophils Absolute: 0.1 10*3/uL (ref 0–0.1)
Basophils Relative: 1 %
Eosinophils Absolute: 0.2 10*3/uL (ref 0–0.7)
Eosinophils Relative: 2 %
HCT: 34.8 % — ABNORMAL LOW (ref 35.0–47.0)
Hemoglobin: 12 g/dL (ref 12.0–16.0)
Lymphocytes Relative: 70 %
Lymphs Abs: 7.5 10*3/uL — ABNORMAL HIGH (ref 1.0–3.6)
MCH: 31 pg (ref 26.0–34.0)
MCHC: 34.6 g/dL (ref 32.0–36.0)
MCV: 89.5 fL (ref 80.0–100.0)
Monocytes Absolute: 0.3 10*3/uL (ref 0.2–0.9)
Monocytes Relative: 3 %
Neutro Abs: 2.5 10*3/uL (ref 1.4–6.5)
Neutrophils Relative %: 24 %
Platelets: 147 10*3/uL — ABNORMAL LOW (ref 150–440)
RBC: 3.89 MIL/uL (ref 3.80–5.20)
RDW: 16 % — ABNORMAL HIGH (ref 11.5–14.5)
WBC: 10.6 10*3/uL (ref 3.6–11.0)

## 2016-03-31 LAB — COMPREHENSIVE METABOLIC PANEL
ALT: 14 U/L (ref 14–54)
AST: 20 U/L (ref 15–41)
Albumin: 4 g/dL (ref 3.5–5.0)
Alkaline Phosphatase: 71 U/L (ref 38–126)
Anion gap: 5 (ref 5–15)
BUN: 20 mg/dL (ref 6–20)
CO2: 25 mmol/L (ref 22–32)
Calcium: 9 mg/dL (ref 8.9–10.3)
Chloride: 107 mmol/L (ref 101–111)
Creatinine, Ser: 0.82 mg/dL (ref 0.44–1.00)
GFR calc Af Amer: >60 mL/min (ref >60)
GFR calc non Af Amer: >60 mL/min (ref >60)
Glucose, Bld: 107 mg/dL — ABNORMAL HIGH (ref 65–99)
Potassium: 4.4 mmol/L (ref 3.5–5.1)
Sodium: 137 mmol/L (ref 135–145)
Total Bilirubin: 0.8 mg/dL (ref 0.3–1.2)
Total Protein: 6.7 g/dL (ref 6.5–8.1)

## 2016-03-31 LAB — FERRITIN: Ferritin: 39 ng/mL (ref 11–307)

## 2016-03-31 LAB — URIC ACID: Uric Acid, Serum: 6.4 mg/dL (ref 2.3–6.6)

## 2016-03-31 NOTE — Progress Notes (Signed)
[facility] day:  03/31/2016  Chief Complaint: [name] is a 75 y.o. 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  HPI:  The patient notes a history of back problems. She underwent a CT scan on 04/06/2015 and was told she had "inflammation of the bone marrow".  Abdomen and pelvic CT scan on 04/06/2015 revealed mild pelvic lymphadenopathy and mild splenomegaly (volume 613 cubic cm).  The largest lymph node was in the right external iliac chain and measured 1.9 cm.  She has had regular mammograms.  Mammogram and ultrasound on 07/23/2014 revealed no suspicious masses or adenopathy. Mammogram on 03/03/2016 revealed no evidence of malignancy in either breast.  There was interval left inferior axillary adenopathy.  Targeted ultrasound revealed multiple rounded and oval lymph nodes with marked diffuse cortical thickening and loss of the normal fatty hila in the inferior left axilla. The largest lymph node measured 2.0 cm x 1.3 cm.   Ultrasound guided core biopsy of a left axillary lymph node on 03/15/2016 revealed chronic lymphocytic leukemia/small lymphocytic lymphoma.  Flow cytometry demonstrated a monoclonal B-cell  population expressing CD5, CD23, CD43, and kappa light chains. Immunohistochemistry (IHC) revealed the following results: positive for CD20 (diffuse), CD5 (diffuse), CD23 (50%), BCL-6 (majority of cells, weak) and negative for cyclin-D1, CD10, CD3 (background T cells only).  Ki67 pattern was consistent with typical proliferation centers.  She has had mild lymphocytosis and thrombocytopenia.  CBC on 04/30/2015 revealed a hematocrit 37.7, hemoglobin 13, MCV 89.1, platelets 142,000, white count 9000 and ANC of 2900. Absolute lymphocyte count was 5600 (elevated).  CBC on 11/25/2015 included a hematocrit 33.9, hemoglobin 12, MCV 90.4, platelets 110,000, white count  8900 with an [place] of 2800. Absolute lymphocyte count was 5500.  Symptomatically, she denies any fevers or weight loss. She note some sweats for the past 5-6 months. She denies any other known adenopathy. She has no after bath itching. She denies any recurrent infections.  She noted a sinus infection in the past.  Last week she had some nausea vomiting and diarrhea for 24 hours.   Past Medical History:  Diagnosis Date  . Acute diarrhea 04/08/2014  . Anxiety   . Asthma   . 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 Chronic migraine without aura 03/13/2014   Last Assessment & Plan:  Headaches stable generally   . Depression   . Fibrocystic disease of breast   . GERD (gastroesophageal reflux disease)   . Hiatal hernia   . Hyperlipidemia   . Osteoporosis   . Peptic ulcer disease   . Psoriasis   . Renal stone   . Syncope and collapse 10/29/2009   Qualifier: Diagnosis of  By: [name]      Past Surgical History:  Procedure Laterality Date  . APPENDECTOMY    . BREAST EXCISIONAL BIOPSY Right 2005   neg  . BREAST EXCISIONAL BIOPSY Right 1990   neg  . HERNIA REPAIR    . PARTIAL HYSTERECTOMY    . VESICOVAGINAL FISTULA CLOSURE W/ TAH      Family History  Problem Relation Age of Onset  . Hematuria Father   . Congestive Heart Failure Father   . Dementia Mother   . Congestive Heart Failure Mother   . Neuropathy Brother   . Coronary artery disease Other   . Breast cancer Maternal Aunt   . Breast cancer Paternal Aunt   .  Breast cancer Maternal Aunt     Social History:  reports that she has never smoked. She has never used smokeless tobacco. She reports that she does not drink alcohol or use drugs.  She lives in [place].  The patient is accompanied by her son, [name] and daughter-in-law, [name], today.  Allergies: No Known Allergies  Current Medications: Current Outpatient Prescriptions  Medication Sig Dispense Refill  . ferrous sulfate 325 (65 FE) MG tablet Take 325 mg by mouth at  bedtime.    Marland Kitchen LORazepam (ATIVAN) 0.5 MG tablet Take 0.5 mg by mouth at bedtime.     . Multiple Vitamins-Minerals (CENTRUM WOMEN) TABS Take 1 tablet by mouth at bedtime.    . pantoprazole (PROTONIX) 40 MG tablet Take 40 mg by mouth 2 (two) times daily.     . pravastatin (PRAVACHOL) 80 MG tablet     . sertraline (ZOLOFT) 50 MG tablet     . Fluticasone-Salmeterol (ADVAIR) 100-50 MCG/DOSE AEPB Inhale 1 puff into the lungs as needed.    . gabapentin (NEURONTIN) 300 MG capsule Take 1 capsule (300 mg total) by mouth 3 (three) times daily. (Patient not taking: Reported on 03/31/2016) 90 capsule 3  . sucralfate (CARAFATE) 1 g tablet Take by mouth.     No current facility-administered medications for this visit.     Review of Systems:  GENERAL:  Feels good.  Active.  No fevers. Sweats x 5-6 months.  No weight loss. PERFORMANCE STATUS (ECOG):  0 HEENT:  Sinus infection.  No visual changes, runny nose, sore throat, mouth sores or tenderness. Lungs: No shortness of breath.  Cough.  No hemoptysis. Cardiac:  No chest pain, palpitations, orthopnea, or PND. Breasts:  Right breast leaking fluid for > 1 year; seen by Dr. Rochel Huerta 8-10 months ago. GI:  24 hour nausea, vomiting and diarrhea last week.  Early satiety.  No constipation, melena or hematochezia. GU:  Incontinence.  No urgency, frequency, dysuria, or hematuria. Musculoskeletal: Back strained a little.  Left ankle fracture in 07/2015 ("not heal right").  No muscle tenderness. Extremities:  No pain or swelling. Skin:  No rashes or skin changes.  No after bath itching. Neuro:  Headache.  No numbness or weakness, balance or coordination issues. Endocrine:  No diabetes, thyroid issues, hot flashes or night sweats. Psych:  Anxious. Pain:  No focal pain. Review of systems:  All other systems reviewed and found to be negative.  Physical Exam: Blood pressure 125/81, pulse 77, temperature (!) 96.1 F (35.6 C), temperature source Tympanic, resp. rate  18, height 5' 7.32" (1.71 m), weight 182 lb 15.7 oz (83 kg). GENERAL:  Well developed, well nourished, sitting comfortably in the exam room in no acute distress. MENTAL STATUS:  Alert and oriented to person, place and time. HEAD:  Dark blonde styled hair.  Normocephalic, atraumatic, face symmetric, no Cushingoid features. EYES:  Glasses.  Pupils equal round and reactive to light and accomodation.  No conjunctivitis or scleral icterus. ENT:  Oropharynx clear without lesion.  Tongue normal. Mucous membranes moist.  RESPIRATORY:  Clear to auscultation without rales, wheezes or rhonchi. CARDIOVASCULAR:  Regular rate and rhythm without murmur, rub or gallop. CHEST WALL:  Rib tender. ABDOMEN:  Soft, non-tender, with active bowel sounds, and no hepatomegaly.  Spleen tip barely palpable on deep inspiration.  No masses. SKIN:  No rashes, ulcers or lesions. EXTREMITIES: No edema, no skin discoloration or tenderness.  No palpable cords. LYMPH NODES:  Small right axillary adenopathy.  Few small  left axillary nodes (largest 1.5-2 cm).  No palpable cervical, supraclavicular, axillary or inguinal adenopathy  NEUROLOGICAL: Unremarkable. PSYCH:  Appropriate.   No visits with results within 3 Day(s) from this visit.  Latest known visit with results is:  Hospital Outpatient Visit on 03/15/2016  Component Date Value Ref Range Status  . SURGICAL PATHOLOGY 03/17/2016    Final                   Value:Surgical Pathology CASE: [id number] PATIENT: [name] Surgical Pathology Report     SPECIMEN SUBMITTED: A. Axilla node, left  CLINICAL HISTORY: Enlarged/morphologically abnormal lymph nodes in left axilla  PRE-OPERATIVE DIAGNOSIS: Reactive vs CA (lymphoma, METS)...samples sent in formalin and saline  POST-OPERATIVE DIAGNOSIS: None provided.     DIAGNOSIS: A. LYMPH NODE, LEFT AXILLA; ULTRASOUND-GUIDED CORE BIOPSY: - CHRONIC LYMPHOCYTIC LEUKEMIA/SMALL LYMPHOCYTIC LYMPHOMA.  Comment: The  nodal architecture is effaced by a diffuse population of small lymphocytes. Scattered proliferation centers are noted. A few residual germinal centers are noted. There is no evidence of metastatic carcinoma.  Flow cytometry was performed and demonstrates a monoclonal B-cell population expressing CD5, CD23, CD43, and kappa light chains (testing by [facility] for Molecular Biology and Pathology accession no. [id number]).  Immunohistochemistry (IHC) was                          performed with the following results: Positive: CD20(diffuse), CD5(diffuse), CD23(50%), BCL-6(majority of cells, weak) Negative: Cyclin-D1, CD10, CD3(background T cells only) Ki67: pattern consistent with typical proliferation centers  The immunophenotype is characteristic of CLL/SLL. I note persistent lymphocytosis, recently 5500 absolute lymphocyte count. Further evaluation will be needed.  The diagnosis was called to Eino Farber in the [facility] on 03/17/16. Read-back was performed.  IHC slides were prepared by [facility] for Molecular Biology and Pathology, [place], [place], and interpreted by Dr. Dicie Beam. All controls stained appropriately.   GROSS DESCRIPTION: A1. Labeled: left axilla node Tissue fragment(s): multiple Size: aggregate, 3.2 x 0.5 x 0.1 Description: cores of pink-tan tissue on a saline gauze. Diff-Quik touch imprint prepared, a representative core fragment is submitted in RPMI media for flow cytometry. The remaining tissue is                          formalin fixed wrapped in lens paper and submitted in a mesh bag  Entirely submitted in cassette(s)1-2.  A2. Labeled: left axilla node Tissue fragment(s): aggregate, 2.6 x 0.2 x 0.1 cm Description: pink to tan cores and blood clot, received in formalin, wrapped in lens paper and submitted in a mesh bag  Entirely submitted in cassette(s) 3-4.  Final Diagnosis performed by Bryan Lemma, MD.  Electronically  signed 03/17/2016 5:12:55PM    The electronic signature indicates that the named Attending Pathologist has evaluated the specimen  Technical component performed at [facility], [address], [place], [place] [address] Lab: [phone] Dir: Darrick Penna. Evette Doffing, MD  Professional component performed at [facility], [facility], [place], [place], [place] [address] Lab: [phone] Dir: Dellia Nims. Reuel Derby, MD      Assessment:  [name] is a 75 y.o. female with chronic lymphocytic leukemia/small lymphocytic lymphoma.  She has had mild lymphocytosis and thrombocytopenia since 04/30/2015.  Ultrasound guided core biopsy of a left axillary lymph node on 03/15/2016 revealed CLL/SLL.  Flow cytometry demonstrated a monoclonal B-cell population expressing CD5, CD23, CD43, and kappa light chains. Immunohistochemistry (  IHC) revealed the following results: positive for CD20 (diffuse), CD5 (diffuse), CD23 (50%), BCL-6 (majority of cells, weak) and negative for cyclin-D1, CD10, CD3 (background T cells only).  Ki67 pattern was consistent with typical proliferation centers.  Abdomen and pelvic CT scan on 04/06/2015 revealed mild pelvic lymphadenopathy and mild splenomegaly (volume 613 cubic cm).  The largest lymph node was in the right external iliac chain and measured 1.9 cm.  Symptomatically, she denies any fevers or weight loss. She note some sweats for the past 5-6 months.  She denies any recurrent infections.  Exam reveals small bilateral axillary adenopathy (left > right) and a palpable spleen tip.  Plan: 1.  Discuss diagnosis of CLL/SLL.  Discuss mild lymphocytosis and thrombocytopenia since 2016.  Discuss staging with CT scans.  Discuss indications for treatment (fatigue, B symptoms, significant anemia or thrombocytopenia, recurrent infections, threatened organ function, disfiguring adenopathy). 2.  Labs today:  CBC with diff, CMP, LDH, uric acid, ferritin, iron studies, CLL  FISH studies. 3.  Chest, abdomen, and pelvic CT scans 4.  RTC in 1-2 weeks (after above back) for MD review of testing.   Lequita Asal, MD  03/31/2016, 12:07 PM

## 2016-03-31 NOTE — Progress Notes (Signed)
During patient's screening mammogram worsening left  axillary lymph nodes enlargement.  Biopsy was performed that did result as CLL.  Does have occasional mid back pain that flares up with activity.

## 2016-03-31 NOTE — Patient Instructions (Signed)
Chronic Lymphocytic Leukemia Chronic lymphocytic leukemia (CLL) is a type of cancer of the bone marrow and blood cells. Bone marrow is the soft, spongy tissue inside your bone. In CLL, the bone marrow makes too many white blood cells that usually fight infection in the body (lymphocytes). CLL usually gets worse slowly and is the most common type of adult leukemia. What increases the risk? No one knows the exact cause of CLL. There is a higher risk of CLL in people who:  Are older than 50 years.  Are white.  Are female.  Have a family history of CLL or other cancers of the lymph system.  Are of [place] or [place] descent.  Have been exposed to certain chemicals, such as Agent Orange (used in the [place] War) or other herbicides or insecticides. What are the signs or symptoms? At first, there may be no symptoms of chronic lymphocytic leukemia. After a while, some symptoms may occur, such as:  Feeling more tired than usual, even after rest.  Unplanned weight loss.  Heavy sweating at night.  Fevers.  Shortness of breath.  Decreased energy.  Paleness.  Painless, swollen lymph nodes.  A feeling of fullness in the upper left part of the abdomen.  Easy bruising or bleeding.  More frequent infections. How is this diagnosed? Your health care provider may perform the following exams and tests to diagnose CLL:  Physical exam to check for an enlarged spleen, liver, or lymph nodes.  Blood and bone marrow tests to identify the presence of cancer cells. These may include tests such as complete blood count, flow cytometry, immunophenotyping, and fluorescence in situ hybridization (FISH).  CT scan to look for swelling or abnormalities in your spleen, liver, and lymph nodes. How is this treated? Treatment options for CLL depend on the stage and the presence of symptoms. There are a number of types of treatment used for this condition,  including:  Observation.  Targeted drugs. These are drugs that interfere with chemicals that leukemia cells need in order to grow and multiply. They identify and attack specific cancer cells without harming normal cells.  Chemotherapy drugs. These medicines kill cells that are multiplying quickly, such as leukemia cells.  Radiation.  Surgery to remove the spleen.  Biological therapy. This treatment boosts the ability of your own immune system to fight the leukemia cells.  Bone marrow or peripheral blood stem cell transplant. This treatment allows the patient to receive very high doses of chemotherapy or radiation or both. These high doses kill the cancer cells but also destroy the bone marrow. After treatment is complete, you are given donor bone marrow or stem cells, which will replace the bone marrow. Follow these instructions at home:  Because you have an increased risk of infection, practice good hand washing and avoid being around people who are ill or being in crowded places.  Because you have an increased risk of bleeding and bruising, avoid contact sports or other rough activities.  Take medicines only directed by your health care provider.  Although some of your treatments might affect your appetite, try to eat regular, healthy meals.  If you develop any side effects, such as nausea, diarrhea, rash, white patches in your mouth, a sore throat, difficulty swallowing, or severe fatigue, tell your health care provider. He or she may have recommendations of things you can do to improve symptoms.  Consider learning some ways to cope with the stress of having a chronic illness, such as yoga,  meditation, or participating in a support group. Contact a health care provider if:  You develop chest pains.  You notice pain, swelling or redness anywhere in your legs.  You have pain in your belly (abdomen).  You develop new bruises that are getting bigger.  You have painful or more  swollen lymph nodes.  You develop bleeding from your gums, nose, or in your urine or stools.  You are unable to stop throwing up (vomiting).  You cannot keep liquids down.  You feel light-headed.  You have a fever.  You develop a severe stiff neck or headache. Get help right away if:  You have trouble breathing or feel short of breath.  You faint. This information is not intended to replace advice given to you by your health care provider. Make sure you discuss any questions you have with your health care provider. Document Released: 09/03/2008 Document Revised: 09/23/2015 Document Reviewed: 10/10/2012 Elsevier Interactive Patient Education  2017 [employer].

## 2016-04-04 ENCOUNTER — Ambulatory Visit
Admission: RE | Admit: 2016-04-04 | Discharge: 2016-04-04 | Disposition: A | Discharge status: Home / Self Care | Payer: Medicare Other | Source: Ambulatory Visit | Attending: Hematology and Oncology | Admitting: Hematology and Oncology

## 2016-04-04 DIAGNOSIS — K449 Diaphragmatic hernia without obstruction or gangrene: Secondary | ICD-10-CM | POA: Diagnosis not present

## 2016-04-04 DIAGNOSIS — I700 Atherosclerosis of aorta: Secondary | ICD-10-CM | POA: Diagnosis not present

## 2016-04-04 DIAGNOSIS — Z90710 Acquired absence of both cervix and uterus: Secondary | ICD-10-CM | POA: Diagnosis not present

## 2016-04-04 DIAGNOSIS — I2510 Atherosclerotic heart disease of native coronary artery without angina pectoris: Secondary | ICD-10-CM | POA: Diagnosis not present

## 2016-04-04 DIAGNOSIS — M47814 Spondylosis without myelopathy or radiculopathy, thoracic region: Secondary | ICD-10-CM | POA: Diagnosis not present

## 2016-04-04 DIAGNOSIS — K5730 Diverticulosis of large intestine without perforation or abscess without bleeding: Secondary | ICD-10-CM | POA: Diagnosis not present

## 2016-04-04 DIAGNOSIS — C9110 Chronic lymphocytic leukemia of B-cell type not having achieved remission: Secondary | ICD-10-CM | POA: Insufficient documentation

## 2016-04-04 DIAGNOSIS — M1288 Other specific arthropathies, not elsewhere classified, other specified site: Secondary | ICD-10-CM | POA: Insufficient documentation

## 2016-04-04 DIAGNOSIS — M4186 Other forms of scoliosis, lumbar region: Secondary | ICD-10-CM | POA: Diagnosis not present

## 2016-04-04 DIAGNOSIS — M48061 Spinal stenosis, lumbar region without neurogenic claudication: Secondary | ICD-10-CM | POA: Insufficient documentation

## 2016-04-04 DIAGNOSIS — I77819 Aortic ectasia, unspecified site: Secondary | ICD-10-CM | POA: Diagnosis not present

## 2016-04-04 HISTORY — DX: Malignant (primary) neoplasm, unspecified: C80.1

## 2016-04-04 MED ORDER — IOPAMIDOL (ISOVUE-300) INJECTION 61%
100.0000 mL | Freq: Once | INTRAVENOUS | Status: AC | PRN
  Administered 2016-04-04: 100 mL via INTRAVENOUS

## 2016-04-06 ENCOUNTER — Ambulatory Visit (INDEPENDENT_AMBULATORY_CARE_PROVIDER_SITE_OTHER): Payer: Medicare Other | Admitting: Podiatry

## 2016-04-06 DIAGNOSIS — L600 Ingrowing nail: Secondary | ICD-10-CM

## 2016-04-06 NOTE — Patient Instructions (Signed)

## 2016-04-07 ENCOUNTER — Encounter: Payer: Self-pay | Admitting: Hematology and Oncology

## 2016-04-07 ENCOUNTER — Inpatient Hospital Stay (HOSPITAL_BASED_OUTPATIENT_CLINIC_OR_DEPARTMENT_OTHER): Payer: Medicare Other | Admitting: Hematology and Oncology

## 2016-04-07 VITALS — BP 127/80 | HR 64 | Temp 96.3°F | Resp 18 | Wt 187.2 lb

## 2016-04-07 DIAGNOSIS — C9110 Chronic lymphocytic leukemia of B-cell type not having achieved remission: Secondary | ICD-10-CM | POA: Diagnosis not present

## 2016-04-07 DIAGNOSIS — D696 Thrombocytopenia, unspecified: Secondary | ICD-10-CM | POA: Diagnosis not present

## 2016-04-07 DIAGNOSIS — F419 Anxiety disorder, unspecified: Secondary | ICD-10-CM

## 2016-04-07 DIAGNOSIS — R161 Splenomegaly, not elsewhere classified: Secondary | ICD-10-CM

## 2016-04-07 DIAGNOSIS — K449 Diaphragmatic hernia without obstruction or gangrene: Secondary | ICD-10-CM

## 2016-04-07 DIAGNOSIS — J45909 Unspecified asthma, uncomplicated: Secondary | ICD-10-CM

## 2016-04-07 DIAGNOSIS — R51 Headache: Secondary | ICD-10-CM

## 2016-04-07 DIAGNOSIS — K5730 Diverticulosis of large intestine without perforation or abscess without bleeding: Secondary | ICD-10-CM

## 2016-04-07 DIAGNOSIS — R61 Generalized hyperhidrosis: Secondary | ICD-10-CM | POA: Diagnosis not present

## 2016-04-07 DIAGNOSIS — Z79899 Other long term (current) drug therapy: Secondary | ICD-10-CM

## 2016-04-07 DIAGNOSIS — R32 Unspecified urinary incontinence: Secondary | ICD-10-CM

## 2016-04-07 DIAGNOSIS — R590 Localized enlarged lymph nodes: Secondary | ICD-10-CM

## 2016-04-07 DIAGNOSIS — E785 Hyperlipidemia, unspecified: Secondary | ICD-10-CM

## 2016-04-07 DIAGNOSIS — K219 Gastro-esophageal reflux disease without esophagitis: Secondary | ICD-10-CM

## 2016-04-07 DIAGNOSIS — R6881 Early satiety: Secondary | ICD-10-CM

## 2016-04-07 LAB — FISH HES LEUKEMIA, 4Q12 REA

## 2016-04-07 NOTE — Progress Notes (Signed)
[facility] day:  04/07/2016  Chief Complaint: JEANNINE PENNISI is a 75 y.o. 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   HPI:  The patient was last seen in the medical oncology clinic on 03/31/2016 for initial consultation.  At that time, left axillary biopsy from 03/15/2016 had confirmed CLL/SLL.  Exam revealed small bilateral axillary lymph nodes (left > right).  She denied any B symptoms except for some sweats.  She underwent a work-up.  Labs included a hematocrit of 34.8, hemoglobin 12.0, MCV 89.5, platelets 147,000, WBC 10,600 with an ANC of 2500.  Absolute lymphocyte count (ALC) was 7500.  LDH and uric acid were normal.  Ferritin was 39.  Iron studies were normal with an iron saturation of 16% and a TIBC of 337.  CLL FISH studies are pending.  Chest, abdomen, and pelvic CT scan on 04/04/2016 revealed mild adenopathy in the chest, abdomen, and pelvis.  Splenic volume was upper limits of normal (13 x 5.6 x 10.9 cm).  There was sigmoid colon diverticulosis. There was a large hiatal hernia containing most of the stomach. Wall thickening in this herniated part of the stomach may simply be due to nondistention but is technically nonspecific.  Symptomatically, she denies any new complaints.   Past Medical History:  Diagnosis Date  . Acute diarrhea 04/08/2014  . Anxiety   . Asthma   . 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 Cancer ([place])   . Chronic migraine without aura 03/13/2014   Last Assessment & Plan:  Headaches stable generally   . Depression   . Fibrocystic disease of breast   . GERD (gastroesophageal reflux disease)   . Hiatal hernia   . Hyperlipidemia   . Osteoporosis   . Peptic ulcer disease   . Psoriasis   . Renal stone   . Syncope and collapse 10/29/2009   Qualifier: Diagnosis of  By: Rockey Situ MD, Tim      Past Surgical History:  Procedure  Laterality Date  . APPENDECTOMY    . BREAST EXCISIONAL BIOPSY Right 2005   neg  . BREAST EXCISIONAL BIOPSY Right 1990   neg  . HERNIA REPAIR    . PARTIAL HYSTERECTOMY    . VESICOVAGINAL FISTULA CLOSURE W/ TAH      Family History  Problem Relation Age of Onset  . Hematuria Father   . Congestive Heart Failure Father   . Dementia Mother   . Congestive Heart Failure Mother   . Neuropathy Brother   . Coronary artery disease Other   . Breast cancer Maternal Aunt   . Breast cancer Paternal Aunt   . Breast cancer Maternal Aunt     Social History:  reports that she has never smoked. She has never used smokeless tobacco. She reports that she does not drink alcohol or use drugs.  She lives in [place].  The patient is accompanied by her son, [name] and daughter-in-law, [name], today.  Allergies: No Known Allergies  Current Medications: Current Outpatient Prescriptions  Medication Sig Dispense Refill  . azelastine (ASTELIN) 0.1 % nasal spray Place into the nose.    . conjugated estrogens (PREMARIN) vaginal cream Place vaginally.    . ferrous sulfate 325 (65 FE) MG tablet Take 325 mg by mouth at bedtime.    Marland Kitchen HYDROcodone-acetaminophen (NORCO/VICODIN) 5-325 MG tablet Take by mouth.    Marland Kitchen LORazepam (ATIVAN) 0.5 MG tablet Take 0.5  mg by mouth at bedtime.     . Multiple Vitamins-Minerals (CENTRUM WOMEN) TABS Take 1 tablet by mouth at bedtime.    . pravastatin (PRAVACHOL) 80 MG tablet     . sertraline (ZOLOFT) 50 MG tablet     . Fluticasone-Salmeterol (ADVAIR) 100-50 MCG/DOSE AEPB Inhale 1 puff into the lungs as needed.    . gabapentin (NEURONTIN) 300 MG capsule Take 1 capsule (300 mg total) by mouth 3 (three) times daily. (Patient not taking: Reported on 04/07/2016) 90 capsule 3  . pantoprazole (PROTONIX) 40 MG tablet Take 40 mg by mouth 2 (two) times daily.     . sucralfate (CARAFATE) 1 g tablet Take by mouth.     No current facility-administered medications for this visit.     Review of  Systems:  GENERAL:  Feels "ok".  No fevers.  Sweats x 5-6 months.  No weight loss. PERFORMANCE STATUS (ECOG):  0 HEENT:  No visual changes, runny nose, sore throat, mouth sores or tenderness. Lungs: No shortness of breath.  Cough.  No hemoptysis. Cardiac:  No chest pain, palpitations, orthopnea, or PND. Breasts:  Right breast leaking fluid for > 1 year; seen by Dr. Rochel Brome 8-10 months ago. GI:  Early satiety.  No constipation, melena or hematochezia. GU:  Incontinence.  No urgency, frequency, dysuria, or hematuria. Musculoskeletal: Back strained a little.  Left ankle fracture in 07/2015 ("not healed right").  No muscle tenderness. Extremities:  No pain or swelling. Skin:  No rashes or skin changes.  No after bath itching. Neuro:  Headache.  No numbness or weakness, balance or coordination issues. Endocrine:  No diabetes, thyroid issues, hot flashes or night sweats. Psych:  Anxious. Pain:  No focal pain. Review of systems:  All other systems reviewed and found to be negative.  Physical Exam: Blood pressure 127/80, pulse 64, temperature (!) 96.3 F (35.7 C), temperature source Tympanic, resp. rate 18, weight 187 lb 2.7 oz (84.9 kg). GENERAL:  Well developed, well nourished, sitting comfortably in the exam room in no acute distress. MENTAL STATUS:  Alert and oriented to person, place and time. HEAD:  Dark blonde styled hair.  Normocephalic, atraumatic, face symmetric, no Cushingoid features. EYES:  Glasses.  No conjunctivitis or scleral icterus. NEUROLOGICAL: Unremarkable. PSYCH:  Appropriate.   No visits with results within 3 Day(s) from this visit.  Latest known visit with results is:  Appointment on 03/31/2016  Component Date Value Ref Range Status  . WBC 03/31/2016 10.6  3.6 - 11.0 K/uL Final  . RBC 03/31/2016 3.89  3.80 - 5.20 MIL/uL Final  . Hemoglobin 03/31/2016 12.0  12.0 - 16.0 g/dL Final  . HCT 03/31/2016 34.8* 35.0 - 47.0 % Final  . MCV 03/31/2016 89.5  80.0 - 100.0  fL Final  . MCH 03/31/2016 31.0  26.0 - 34.0 pg Final  . MCHC 03/31/2016 34.6  32.0 - 36.0 g/dL Final  . RDW 03/31/2016 16.0* 11.5 - 14.5 % Final  . Platelets 03/31/2016 147* 150 - 440 K/uL Final  . Neutrophils Relative % 03/31/2016 24  % Final  . Neutro Abs 03/31/2016 2.5  1.4 - 6.5 K/uL Final  . Lymphocytes Relative 03/31/2016 70  % Final  . Lymphs Abs 03/31/2016 7.5* 1.0 - 3.6 K/uL Final  . Monocytes Relative 03/31/2016 3  % Final  . Monocytes Absolute 03/31/2016 0.3  0.2 - 0.9 K/uL Final  . Eosinophils Relative 03/31/2016 2  % Final  . Eosinophils Absolute 03/31/2016 0.2  0 - 0.7  K/uL Final  . Basophils Relative 03/31/2016 1  % Final  . Basophils Absolute 03/31/2016 0.1  0 - 0.1 K/uL Final  . Sodium 03/31/2016 137  135 - 145 mmol/L Final  . Potassium 03/31/2016 4.4  3.5 - 5.1 mmol/L Final  . Chloride 03/31/2016 107  101 - 111 mmol/L Final  . CO2 03/31/2016 25  22 - 32 mmol/L Final  . Glucose, Bld 03/31/2016 107* 65 - 99 mg/dL Final  . BUN 03/31/2016 20  6 - 20 mg/dL Final  . Creatinine, Ser 03/31/2016 0.82  0.44 - 1.00 mg/dL Final  . Calcium 03/31/2016 9.0  8.9 - 10.3 mg/dL Final  . Total Protein 03/31/2016 6.7  6.5 - 8.1 g/dL Final  . Albumin 03/31/2016 4.0  3.5 - 5.0 g/dL Final  . AST 03/31/2016 20  15 - 41 U/L Final  . ALT 03/31/2016 14  14 - 54 U/L Final  . Alkaline Phosphatase 03/31/2016 71  38 - 126 U/L Final  . Total Bilirubin 03/31/2016 0.8  0.3 - 1.2 mg/dL Final  . GFR calc non Af Amer 03/31/2016 >60  >60 mL/min Final  . GFR calc Af Amer 03/31/2016 >60  >60 mL/min Final   Comment: (NOTE) The eGFR has been calculated using the CKD EPI equation. This calculation has not been validated in all clinical situations. eGFR's persistently <60 mL/min signify possible Chronic Kidney Disease.   . Anion gap 03/31/2016 5  5 - 15 Final  . LDH 03/31/2016 138  98 - 192 U/L Final  . Uric Acid, Serum 03/31/2016 6.4  2.3 - 6.6 mg/dL Final  . Ferritin 03/31/2016 39  11 - 307 ng/mL  Final  . Iron 03/31/2016 54  28 - 170 ug/dL Final  . TIBC 03/31/2016 337  250 - 450 ug/dL Final  . Saturation Ratios 03/31/2016 16  10.4 - 31.8 % Final  . UIBC 03/31/2016 283  ug/dL Final  . Specimen Type 04/07/2016 Comment:   Final  . Cells Counted: 04/07/2016 Comment:   Final  . Cells Analyzed 04/07/2016 Comment:   Final  . FISH Result 04/07/2016 Comment:   Final  . Interpretation: 04/07/2016 Comment:   Final   Comment: (NOTE)    The CLL interphase fluorescence in situ hybridization (FISH) panel analysis was normal. There were no cells with CCND1-IGH fusion. No extra signals or deletions of ATM, chromosome 12, 13q, or TP53 were observed.    SPECIFIC FISH RESULTS:  CCND1/IGH: NORMAL   .      nuc ish 11q13(CCND1x2),14q32(IGHx2)[100]    ATM: NORMAL      nuc ish 11q22.3(ATMx2)[100] .  12cen: NORMAL     .      nuc ish 12cen(D12Z3x2)[100] .  13q: NORMAL  .      nuc ish 13q14.3(DLEUx2),13q34(TFDP1x2)[100] .  TP53: NORMAL  .      nuc ish 17p13.1(TP53x2)[100]        This analysis is limited to abnormalities detectable by the specific probes included in the study. FISH results should be interpreted within the context of a full cytogenetic analysis and hematologic evaluation.    REFERENCES:.  Malek,(2013) Adv Exp Med Biol [phone].WHQP#[id number]  .  Schnaiter et al.,(2011) Clin Lab Med [phone].VXBL#[id number]         This test was developed and its performace characteristics determine                          d by [facility] ([place]).  It has not been cleared or approved by the U.S. Food and Drug Administration. The DNA probe vendor for this study was [employer] research (physical sciences)).     Assessment:  DONIS KOTOWSKI is a 75 y.o. female with chronic lymphocytic leukemia/small lymphocytic lymphoma.  She has had mild lymphocytosis and thrombocytopenia since 04/30/2015.  Ultrasound guided core biopsy of a left axillary lymph node on 03/15/2016 revealed  CLL/SLL.  Flow cytometry demonstrated a monoclonal B-cell population expressing CD5, CD23, CD43, and kappa light chains. Immunohistochemistry (IHC) revealed the following results: positive for CD20 (diffuse), CD5 (diffuse), CD23 (50%), BCL-6 (majority of cells, weak) and negative for cyclin-D1, CD10, CD3 (background T cells only).  Ki67 pattern was consistent with typical proliferation centers.  Labs on 03/31/2016 included a hematocrit of 34.8, hemoglobin 12.0, MCV 89.5, platelets 147,000, WBC 10,600 with an ANC of 2500.  Absolute lymphocyte count (ALC) was 7500.  LDH and uric acid were normal.  Ferritin was 39.  Iron studies were normal with an iron saturation of 16% and a TIBC of 337.  CLL FISH studies are pending.  Abdomen and pelvic CT scan on 04/06/2015 revealed mild pelvic lymphadenopathy and mild splenomegaly (volume 613 cubic cm).  The largest lymph node was in the right external iliac chain and measured 1.9 cm.  Chest, abdomen, and pelvic CT scan on 04/04/2016 revealed mild adenopathy in the chest, abdomen, and pelvis.  Splenic volume was upper limits of normal (13 x 5.6 x 10.9 cm).  The largest lymph node was a right external iliac node (1.7 cm).  Symptomatically, she denies any fevers or weight loss. She note some sweats for the past 5-6 months.  She denies any recurrent infections.  Exam reveals small bilateral axillary adenopathy (left > right).  Plan: 1.  Review labs and CT scans.  She has early stage disease.  Lymph nodes are small.  By comparison to 04/2015, lymph nodes have not increased in size and are slightly smaller.  Given her mild splenomegaly, she has at most stage II disease.  She has some sweats.  At this point, I discussed ongoing observation without intervention.  I re-reviewed indications for treatment.  The family requested a second opinion at [facility]. 2.  Release of information for [facility]. 3.  Follow-up pending CLL FISH studies. 4.  RTC in 3 months for MD  assessment and labs (CBC with diff, CMP, LDH, uric acid).  Addendum:  CLL FISH studies revealed no cells with CCND1-IGH fusion and no extra signals or deletions of ATM, chromosome 12, 13q, or TP53.   Lequita Asal, MD  04/07/2016

## 2016-04-07 NOTE — Progress Notes (Signed)
Pt has seen a urologist with c/o pain / burning on urination, w urgency ,and incontinence

## 2016-04-09 NOTE — Progress Notes (Signed)
Subjective:     Patient ID: [name], female   DOB: 1940-10-29, 75 y.o.   MRN: [id number]  HPI patient states I think I'm getting an ingrown toenail my left big toe that's been bothering me and making shoe gear painful   Review of Systems     Objective:   Physical Exam Neurovascular status intact with incurvated nail left hallux border that is painful when pressed with distal redness but no active drainage noted    Assessment:     Paronychia with ingrown component left hallux nail that's painful when pressed localized with no indication of proximal spread    Plan:     H&P and condition reviewed with patient. I've recommended removal of the nail border and I explained procedure and risk and patient wants surgery. Today I infiltrated the left hallux 60 mg like Marcaine mixture remove the border and exposed matrix applying phenol 3 applications 30 seconds followed by alcohol lavage and sterile dressing. Gave instructions on soaks and reappoint

## 2016-05-05 ENCOUNTER — Ambulatory Visit: Payer: Medicare Other

## 2016-07-06 ENCOUNTER — Inpatient Hospital Stay: Payer: Medicare Other | Admitting: Hematology and Oncology

## 2016-07-06 ENCOUNTER — Inpatient Hospital Stay: Payer: Medicare Other

## 2016-07-06 NOTE — Progress Notes (Unsigned)
[facility] day:  07/06/2016   Chief Complaint: [name] is a 76 y.o. 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  HPI:  The patient was last seen in the medical oncology clinic on 04/07/2016.  At that time, work-up was reviewed.  She was seen at [facility] by Dr. Patrice Huerta for second opinion on 05/23/2016.  Recommendation was for observation.  left axillary biopsy from 03/15/2016 had confirmed CLL/SLL.  Exam revealed small bilateral axillary lymph nodes (left > right).  She denied any B symptoms except for some sweats.  She underwent a work-up.  Labs included a hematocrit of 34.8, hemoglobin 12.0, MCV 89.5, platelets 147,000, WBC 10,600 with an ANC of 2500.  Absolute lymphocyte count (ALC) was 7500.  LDH and uric acid were normal.  Ferritin was 39.  Iron studies were normal with an iron saturation of 16% and a TIBC of 337.  CLL FISH studies are pending.  Chest, abdomen, and pelvic CT scan on 04/04/2016 revealed mild adenopathy in the chest, abdomen, and pelvis.  Splenic volume was upper limits of normal (13 x 5.6 x 10.9 cm).  There was sigmoid colon diverticulosis. There was a large hiatal hernia containing most of the stomach. Wall thickening in this herniated part of the stomach may simply be due to nondistention but is technically nonspecific.  Symptomatically, she denies any new complaints.   Past Medical History:  Diagnosis Date  . Acute diarrhea 04/08/2014  . Anxiety   . Asthma   . 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 Cancer ([place])   . Chronic migraine without aura 03/13/2014   Last Assessment & Plan:  Headaches stable generally   . Depression   . Fibrocystic disease of breast   . GERD (gastroesophageal reflux disease)   . Hiatal hernia   . Hyperlipidemia   . Osteoporosis   . Peptic ulcer disease   . Psoriasis   . Renal stone   . Syncope and collapse 10/29/2009   Qualifier: Diagnosis of   By: Rockey Situ MD, Tim      Past Surgical History:  Procedure Laterality Date  . APPENDECTOMY    . BREAST EXCISIONAL BIOPSY Right 2005   neg  . BREAST EXCISIONAL BIOPSY Right 1990   neg  . HERNIA REPAIR    . PARTIAL HYSTERECTOMY    . VESICOVAGINAL FISTULA CLOSURE W/ TAH      Family History  Problem Relation Age of Onset  . Hematuria Father   . Congestive Heart Failure Father   . Dementia Mother   . Congestive Heart Failure Mother   . Neuropathy Brother   . Coronary artery disease Other   . Breast cancer Maternal Aunt   . Breast cancer Paternal Aunt   . Breast cancer Maternal Aunt     Social History:  reports that she has never smoked. She has never used smokeless tobacco. She reports that she does not drink alcohol or use drugs.  She lives in [place].  The patient is accompanied by her son, [name] and daughter-in-law, [name], today.  Allergies: No Known Allergies  Current Medications: Current Outpatient Prescriptions  Medication Sig Dispense Refill  . azelastine (ASTELIN) 0.1 % nasal spray Place into the nose.    . conjugated estrogens (PREMARIN) vaginal cream Place vaginally.    . ferrous sulfate 325 (65 FE) MG tablet Take 325 mg by mouth at bedtime.    . Fluticasone-Salmeterol (ADVAIR) 100-50 MCG/DOSE AEPB  Inhale 1 puff into the lungs as needed.    . gabapentin (NEURONTIN) 300 MG capsule Take 1 capsule (300 mg total) by mouth 3 (three) times daily. (Patient not taking: Reported on 04/07/2016) 90 capsule 3  . HYDROcodone-acetaminophen (NORCO/VICODIN) 5-325 MG tablet Take by mouth.    Marland Kitchen LORazepam (ATIVAN) 0.5 MG tablet Take 0.5 mg by mouth at bedtime.     . Multiple Vitamins-Minerals (CENTRUM WOMEN) TABS Take 1 tablet by mouth at bedtime.    . pantoprazole (PROTONIX) 40 MG tablet Take 40 mg by mouth 2 (two) times daily.     . pravastatin (PRAVACHOL) 80 MG tablet     . sertraline (ZOLOFT) 50 MG tablet     . sucralfate (CARAFATE) 1 g tablet Take by mouth.     No current  facility-administered medications for this visit.     Review of Systems:  GENERAL:  Feels "ok".  No fevers.  Sweats x 5-6 months.  No weight loss. PERFORMANCE STATUS (ECOG):  0 HEENT:  No visual changes, runny nose, sore throat, mouth sores or tenderness. Lungs: No shortness of breath.  Cough.  No hemoptysis. Cardiac:  No chest pain, palpitations, orthopnea, or PND. Breasts:  Right breast leaking fluid for > 1 year; seen by Dr. Rochel Brome 8-10 months ago. GI:  Early satiety.  No constipation, melena or hematochezia. GU:  Incontinence.  No urgency, frequency, dysuria, or hematuria. Musculoskeletal: Back strained a little.  Left ankle fracture in 07/2015 ("not healed right").  No muscle tenderness. Extremities:  No pain or swelling. Skin:  No rashes or skin changes.  No after bath itching. Neuro:  Headache.  No numbness or weakness, balance or coordination issues. Endocrine:  No diabetes, thyroid issues, hot flashes or night sweats. Psych:  Anxious. Pain:  No focal pain. Review of systems:  All other systems reviewed and found to be negative.  Physical Exam: There were no vitals taken for this visit. GENERAL:  Well developed, well nourished, sitting comfortably in the exam room in no acute distress. MENTAL STATUS:  Alert and oriented to person, place and time. HEAD:  Dark blonde styled hair.  Normocephalic, atraumatic, face symmetric, no Cushingoid features. EYES:  Glasses.  No conjunctivitis or scleral icterus. NEUROLOGICAL: Unremarkable. PSYCH:  Appropriate.   No visits with results within 3 Day(s) from this visit.  Latest known visit with results is:  Appointment on 03/31/2016  Component Date Value Ref Range Status  . WBC 03/31/2016 10.6  3.6 - 11.0 K/uL Final  . RBC 03/31/2016 3.89  3.80 - 5.20 MIL/uL Final  . Hemoglobin 03/31/2016 12.0  12.0 - 16.0 g/dL Final  . HCT 03/31/2016 34.8* 35.0 - 47.0 % Final  . MCV 03/31/2016 89.5  80.0 - 100.0 fL Final  . MCH 03/31/2016 31.0   26.0 - 34.0 pg Final  . MCHC 03/31/2016 34.6  32.0 - 36.0 g/dL Final  . RDW 03/31/2016 16.0* 11.5 - 14.5 % Final  . Platelets 03/31/2016 147* 150 - 440 K/uL Final  . Neutrophils Relative % 03/31/2016 24  % Final  . Neutro Abs 03/31/2016 2.5  1.4 - 6.5 K/uL Final  . Lymphocytes Relative 03/31/2016 70  % Final  . Lymphs Abs 03/31/2016 7.5* 1.0 - 3.6 K/uL Final  . Monocytes Relative 03/31/2016 3  % Final  . Monocytes Absolute 03/31/2016 0.3  0.2 - 0.9 K/uL Final  . Eosinophils Relative 03/31/2016 2  % Final  . Eosinophils Absolute 03/31/2016 0.2  0 - 0.7 K/uL Final  .  Basophils Relative 03/31/2016 1  % Final  . Basophils Absolute 03/31/2016 0.1  0 - 0.1 K/uL Final  . Sodium 03/31/2016 137  135 - 145 mmol/L Final  . Potassium 03/31/2016 4.4  3.5 - 5.1 mmol/L Final  . Chloride 03/31/2016 107  101 - 111 mmol/L Final  . CO2 03/31/2016 25  22 - 32 mmol/L Final  . Glucose, Bld 03/31/2016 107* 65 - 99 mg/dL Final  . BUN 03/31/2016 20  6 - 20 mg/dL Final  . Creatinine, Ser 03/31/2016 0.82  0.44 - 1.00 mg/dL Final  . Calcium 03/31/2016 9.0  8.9 - 10.3 mg/dL Final  . Total Protein 03/31/2016 6.7  6.5 - 8.1 g/dL Final  . Albumin 03/31/2016 4.0  3.5 - 5.0 g/dL Final  . AST 03/31/2016 20  15 - 41 U/L Final  . ALT 03/31/2016 14  14 - 54 U/L Final  . Alkaline Phosphatase 03/31/2016 71  38 - 126 U/L Final  . Total Bilirubin 03/31/2016 0.8  0.3 - 1.2 mg/dL Final  . GFR calc non Af Amer 03/31/2016 >60  >60 mL/min Final  . GFR calc Af Amer 03/31/2016 >60  >60 mL/min Final   Comment: (NOTE) The eGFR has been calculated using the CKD EPI equation. This calculation has not been validated in all clinical situations. eGFR's persistently <60 mL/min signify possible Chronic Kidney Disease.   . Anion gap 03/31/2016 5  5 - 15 Final  . LDH 03/31/2016 138  98 - 192 U/L Final  . Uric Acid, Serum 03/31/2016 6.4  2.3 - 6.6 mg/dL Final  . Ferritin 03/31/2016 39  11 - 307 ng/mL Final  . Iron 03/31/2016 54  28 -  170 ug/dL Final  . TIBC 03/31/2016 337  250 - 450 ug/dL Final  . Saturation Ratios 03/31/2016 16  10.4 - 31.8 % Final  . UIBC 03/31/2016 283  ug/dL Final  . Specimen Type 03/31/2016 Comment:   Final  . Cells Counted: 03/31/2016 Comment:   Final  . Cells Analyzed 03/31/2016 Comment:   Final  . FISH Result 03/31/2016 Comment:   Final  . Interpretation: 03/31/2016 Comment:   Final   Comment: (NOTE)    The CLL interphase fluorescence in situ hybridization (FISH) panel analysis was normal. There were no cells with CCND1-IGH fusion. No extra signals or deletions of ATM, chromosome 12, 13q, or TP53 were observed.    SPECIFIC FISH RESULTS:  CCND1/IGH: NORMAL   .      nuc ish 11q13(CCND1x2),14q32(IGHx2)[100]    ATM: NORMAL      nuc ish 11q22.3(ATMx2)[100] .  12cen: NORMAL     .      nuc ish 12cen(D12Z3x2)[100] .  13q: NORMAL  .      nuc ish 13q14.3(DLEUx2),13q34(TFDP1x2)[100] .  TP53: NORMAL  .      nuc ish 17p13.1(TP53x2)[100]        This analysis is limited to abnormalities detectable by the specific probes included in the study. FISH results should be interpreted within the context of a full cytogenetic analysis and hematologic evaluation.    REFERENCES:.  Malek,(2013) Adv Exp Med Biol [phone].[id number]  .  Schnaiter et al.,(2011) Clin Lab Med [phone].[id number]         This test was developed and its performace characteristics determine                          d by [employer] ([place]). It has not been  cleared or approved by the U.S. Food and Drug Administration. The DNA probe vendor for this study was [employer] (physical sciences)).     Assessment:  [name] is a 76 y.o. female with chronic lymphocytic leukemia/small lymphocytic lymphoma.  She has had mild lymphocytosis and thrombocytopenia since 04/30/2015.  Ultrasound guided core biopsy of a left axillary lymph node on 03/15/2016 revealed CLL/SLL.  Flow cytometry  demonstrated a monoclonal B-cell population expressing CD5, CD23, CD43, and kappa light chains. Immunohistochemistry (IHC) revealed the following results: positive for CD20 (diffuse), CD5 (diffuse), CD23 (50%), BCL-6 (majority of cells, weak) and negative for cyclin-D1, CD10, CD3 (background T cells only).  Ki67 pattern was consistent with typical proliferation centers.  Labs on 03/31/2016 included a hematocrit of 34.8, hemoglobin 12.0, MCV 89.5, platelets 147,000, WBC 10,600 with an ANC of 2500.  Absolute lymphocyte count (ALC) was 7500.  LDH and uric acid were normal.  Ferritin was 39.  Iron studies were normal with an iron saturation of 16% and a TIBC of 337.  CLL FISH studies are pending.  Abdomen and pelvic CT scan on 04/06/2015 revealed mild pelvic lymphadenopathy and mild splenomegaly (volume 613 cubic cm).  The largest lymph node was in the right external iliac chain and measured 1.9 cm.  Chest, abdomen, and pelvic CT scan on 04/04/2016 revealed mild adenopathy in the chest, abdomen, and pelvis.  Splenic volume was upper limits of normal (13 x 5.6 x 10.9 cm).  The largest lymph node was a right external iliac node (1.7 cm).  Symptomatically, she denies any fevers or weight loss. She note some sweats for the past 5-6 months.  She denies any recurrent infections.  Exam reveals small bilateral axillary adenopathy (left > right).  Plan: 1.  Review labs and CT scans.  She has early stage disease.  Lymph nodes are small.  By comparison to 04/2015, lymph nodes have not increased in size and are slightly smaller.  Given her mild splenomegaly, she has at most stage II disease.  She has some sweats.  At this point, I discussed ongoing observation without intervention.  I re-reviewed indications for treatment.  The family requested a second opinion at [facility]. 2.  Release of information for [facility]. 3.  Follow-up pending CLL FISH studies. 4.  RTC in 3 months for MD assessment and labs (CBC  with diff, CMP, LDH, uric acid).  Addendum:  CLL FISH studies revealed no cells with CCND1-IGH fusion and no extra signals or deletions of ATM, chromosome 12, 13q, or TP53.   Lequita Asal, MD  07/06/2016, 4:38 AM

## 2016-07-20 ENCOUNTER — Ambulatory Visit: Payer: Medicare Other | Admitting: Hematology and Oncology

## 2016-07-20 ENCOUNTER — Other Ambulatory Visit: Payer: Medicare Other

## 2016-09-29 ENCOUNTER — Encounter: Payer: Self-pay | Admitting: Emergency Medicine

## 2016-09-29 ENCOUNTER — Inpatient Hospital Stay
Admission: EM | Admit: 2016-09-29 | Discharge: 2016-09-30 | DRG: 392 | Disposition: A | Discharge status: Home / Self Care | Payer: Medicare Other | Attending: Internal Medicine | Admitting: Internal Medicine

## 2016-09-29 ENCOUNTER — Ambulatory Visit: Payer: Medicare Other

## 2016-09-29 ENCOUNTER — Emergency Department: Payer: Medicare Other

## 2016-09-29 ENCOUNTER — Ambulatory Visit (INDEPENDENT_AMBULATORY_CARE_PROVIDER_SITE_OTHER)
Admission: EM | Admit: 2016-09-29 | Discharge: 2016-09-29 | Disposition: A | Discharge status: Other Acute Inpatient Hospital | Payer: Medicare Other | Source: Home / Self Care | Attending: Internal Medicine | Admitting: Internal Medicine

## 2016-09-29 DIAGNOSIS — F329 Major depressive disorder, single episode, unspecified: Secondary | ICD-10-CM | POA: Diagnosis not present

## 2016-09-29 DIAGNOSIS — Z79899 Other long term (current) drug therapy: Secondary | ICD-10-CM

## 2016-09-29 DIAGNOSIS — F419 Anxiety disorder, unspecified: Secondary | ICD-10-CM | POA: Diagnosis present

## 2016-09-29 DIAGNOSIS — Z8249 Family history of ischemic heart disease and other diseases of the circulatory system: Secondary | ICD-10-CM | POA: Diagnosis not present

## 2016-09-29 DIAGNOSIS — M810 Age-related osteoporosis without current pathological fracture: Secondary | ICD-10-CM | POA: Diagnosis present

## 2016-09-29 DIAGNOSIS — Z8711 Personal history of peptic ulcer disease: Secondary | ICD-10-CM

## 2016-09-29 DIAGNOSIS — Z66 Do not resuscitate: Secondary | ICD-10-CM | POA: Diagnosis not present

## 2016-09-29 DIAGNOSIS — R9431 Abnormal electrocardiogram [ECG] [EKG]: Secondary | ICD-10-CM

## 2016-09-29 DIAGNOSIS — N179 Acute kidney failure, unspecified: Secondary | ICD-10-CM | POA: Diagnosis not present

## 2016-09-29 DIAGNOSIS — K5792 Diverticulitis of intestine, part unspecified, without perforation or abscess without bleeding: Secondary | ICD-10-CM | POA: Diagnosis present

## 2016-09-29 DIAGNOSIS — I10 Essential (primary) hypertension: Secondary | ICD-10-CM | POA: Diagnosis present

## 2016-09-29 DIAGNOSIS — I447 Left bundle-branch block, unspecified: Secondary | ICD-10-CM | POA: Diagnosis not present

## 2016-09-29 DIAGNOSIS — R0789 Other chest pain: Secondary | ICD-10-CM

## 2016-09-29 DIAGNOSIS — K5732 Diverticulitis of large intestine without perforation or abscess without bleeding: Secondary | ICD-10-CM | POA: Diagnosis not present

## 2016-09-29 DIAGNOSIS — I200 Unstable angina: Secondary | ICD-10-CM | POA: Diagnosis present

## 2016-09-29 DIAGNOSIS — R109 Unspecified abdominal pain: Secondary | ICD-10-CM | POA: Diagnosis present

## 2016-09-29 DIAGNOSIS — E785 Hyperlipidemia, unspecified: Secondary | ICD-10-CM | POA: Diagnosis not present

## 2016-09-29 DIAGNOSIS — K219 Gastro-esophageal reflux disease without esophagitis: Secondary | ICD-10-CM | POA: Diagnosis present

## 2016-09-29 DIAGNOSIS — R079 Chest pain, unspecified: Secondary | ICD-10-CM

## 2016-09-29 LAB — COMPREHENSIVE METABOLIC PANEL
ALK PHOS: 82 U/L (ref 38–126)
ALT: 16 U/L (ref 14–54)
AST: 18 U/L (ref 15–41)
Albumin: 3.8 g/dL (ref 3.5–5.0)
Anion gap: 8 (ref 5–15)
BUN: 29 mg/dL — AB (ref 6–20)
CALCIUM: 9.1 mg/dL (ref 8.9–10.3)
CHLORIDE: 103 mmol/L (ref 101–111)
CO2: 25 mmol/L (ref 22–32)
CREATININE: 1.6 mg/dL — AB (ref 0.44–1.00)
GFR calc Af Amer: 35 mL/min — ABNORMAL LOW (ref >60)
GFR, EST NON AFRICAN AMERICAN: 30 mL/min — AB (ref >60)
Glucose, Bld: 125 mg/dL — ABNORMAL HIGH (ref 65–99)
Potassium: 3.8 mmol/L (ref 3.5–5.1)
SODIUM: 136 mmol/L (ref 135–145)
Total Bilirubin: 1.3 mg/dL — ABNORMAL HIGH (ref 0.3–1.2)
Total Protein: 7.2 g/dL (ref 6.5–8.1)

## 2016-09-29 LAB — TROPONIN I
Troponin I: <0.03 ng/mL (ref <0.03)
Troponin I: <0.03 ng/mL (ref <0.03)

## 2016-09-29 LAB — CBC WITH DIFFERENTIAL/PLATELET
BASOS PCT: 1 %
Basophils Absolute: 0.1 10*3/uL (ref 0–0.1)
EOS PCT: 2 %
Eosinophils Absolute: 0.2 10*3/uL (ref 0–0.7)
HEMATOCRIT: 35 % (ref 35.0–47.0)
Hemoglobin: 12.2 g/dL (ref 12.0–16.0)
LYMPHS ABS: 7.3 10*3/uL — AB (ref 1.0–3.6)
Lymphocytes Relative: 65 %
MCH: 32.4 pg (ref 26.0–34.0)
MCHC: 34.9 g/dL (ref 32.0–36.0)
MCV: 92.8 fL (ref 80.0–100.0)
MONO ABS: 0.4 10*3/uL (ref 0.2–0.9)
MONOS PCT: 4 %
NEUTROS ABS: 3.1 10*3/uL (ref 1.4–6.5)
Neutrophils Relative %: 28 %
PLATELETS: 145 10*3/uL — AB (ref 150–440)
RBC: 3.77 MIL/uL — ABNORMAL LOW (ref 3.80–5.20)
RDW: 16.4 % — AB (ref 11.5–14.5)
WBC: 11.1 10*3/uL — ABNORMAL HIGH (ref 3.6–11.0)

## 2016-09-29 LAB — PROTIME-INR
INR: 1.05
Prothrombin Time: 13.7 seconds (ref 11.4–15.2)

## 2016-09-29 LAB — LIPID PANEL
CHOL/HDL RATIO: 4.3 ratio
CHOL/HDL RATIO: 4 ratio
CHOLESTEROL: 179 mg/dL (ref 0–200)
CHOLESTEROL: 170 mg/dL (ref 0–200)
HDL: 42 mg/dL (ref >40)
HDL: 43 mg/dL (ref >40)
LDL CALC: 100 mg/dL — AB (ref 0–99)
LDL Cholesterol: 108 mg/dL — ABNORMAL HIGH (ref 0–99)
TRIGLYCERIDES: 137 mg/dL (ref <150)
Triglycerides: 143 mg/dL (ref <150)
VLDL: 29 mg/dL (ref 0–40)
VLDL: 27 mg/dL (ref 0–40)

## 2016-09-29 LAB — APTT: APTT: 35 s (ref 24–36)

## 2016-09-29 MED ORDER — ADULT MULTIVITAMIN W/MINERALS CH
1.0000 | ORAL_TABLET | Freq: Every day | ORAL | Status: DC
  Administered 2016-09-29: 1 via ORAL
  Filled 2016-09-29: qty 1

## 2016-09-29 MED ORDER — NITROGLYCERIN 2 % TD OINT
0.5000 [in_us] | TOPICAL_OINTMENT | Freq: Once | TRANSDERMAL | Status: AC
  Administered 2016-09-29: 0.5 [in_us] via TOPICAL

## 2016-09-29 MED ORDER — GABAPENTIN 300 MG PO CAPS
300.0000 mg | ORAL_CAPSULE | Freq: Three times a day (TID) | ORAL | Status: DC
  Administered 2016-09-29 – 2016-09-30 (×2): 300 mg via ORAL
  Filled 2016-09-29 (×3): qty 1

## 2016-09-29 MED ORDER — ACETAMINOPHEN 325 MG PO TABS: 650.0000 mg | ORAL_TABLET | Freq: Four times a day (QID) | ORAL | Status: DC | PRN

## 2016-09-29 MED ORDER — FERROUS SULFATE 325 (65 FE) MG PO TABS
325.0000 mg | ORAL_TABLET | Freq: Every day | ORAL | Status: DC
  Administered 2016-09-29: 325 mg via ORAL
  Filled 2016-09-29: qty 1

## 2016-09-29 MED ORDER — ASPIRIN 81 MG PO CHEW: 324.0000 mg | CHEWABLE_TABLET | Freq: Once | ORAL | Status: DC

## 2016-09-29 MED ORDER — SERTRALINE HCL 50 MG PO TABS
50.0000 mg | ORAL_TABLET | Freq: Every day | ORAL | Status: DC
  Administered 2016-09-29 – 2016-09-30 (×2): 50 mg via ORAL
  Filled 2016-09-29: qty 1

## 2016-09-29 MED ORDER — PRAVASTATIN SODIUM 40 MG PO TABS
40.0000 mg | ORAL_TABLET | Freq: Every day | ORAL | Status: DC
  Administered 2016-09-29: 40 mg via ORAL
  Filled 2016-09-29: qty 1

## 2016-09-29 MED ORDER — METOPROLOL TARTRATE 25 MG PO TABS
25.0000 mg | ORAL_TABLET | Freq: Two times a day (BID) | ORAL | Status: DC
  Administered 2016-09-30: 25 mg via ORAL
  Filled 2016-09-29 (×2): qty 1

## 2016-09-29 MED ORDER — ONDANSETRON HCL 4 MG PO TABS: 4.0000 mg | ORAL_TABLET | Freq: Four times a day (QID) | ORAL | Status: DC | PRN

## 2016-09-29 MED ORDER — ACETAMINOPHEN 650 MG RE SUPP: 650.0000 mg | Freq: Four times a day (QID) | RECTAL | Status: DC | PRN

## 2016-09-29 MED ORDER — SENNOSIDES-DOCUSATE SODIUM 8.6-50 MG PO TABS: 1.0000 | ORAL_TABLET | Freq: Every evening | ORAL | Status: DC | PRN

## 2016-09-29 MED ORDER — TOPIRAMATE 25 MG PO TABS
25.0000 mg | ORAL_TABLET | Freq: Two times a day (BID) | ORAL | Status: DC
  Administered 2016-09-30: 25 mg via ORAL
  Filled 2016-09-29 (×3): qty 1

## 2016-09-29 MED ORDER — PANTOPRAZOLE SODIUM 40 MG PO TBEC
40.0000 mg | DELAYED_RELEASE_TABLET | Freq: Two times a day (BID) | ORAL | Status: DC
  Administered 2016-09-29 – 2016-09-30 (×2): 40 mg via ORAL
  Filled 2016-09-29 (×2): qty 1

## 2016-09-29 MED ORDER — PIPERACILLIN-TAZOBACTAM 3.375 G IVPB
3.3750 g | Freq: Three times a day (TID) | INTRAVENOUS | Status: DC
  Administered 2016-09-29 – 2016-09-30 (×3): 3.375 g via INTRAVENOUS
  Filled 2016-09-29 (×8): qty 50

## 2016-09-29 MED ORDER — PIPERACILLIN-TAZOBACTAM 3.375 G IVPB 30 MIN
3.3750 g | Freq: Once | INTRAVENOUS | Status: DC
Start: 2016-09-29 — End: 2016-09-29

## 2016-09-29 MED ORDER — ENOXAPARIN SODIUM 40 MG/0.4ML ~~LOC~~ SOLN
40.0000 mg | SUBCUTANEOUS | Status: DC
  Administered 2016-09-29: 40 mg via SUBCUTANEOUS
  Filled 2016-09-29: qty 0.4

## 2016-09-29 MED ORDER — SODIUM CHLORIDE 0.9 % IV SOLN
INTRAVENOUS | Status: DC
  Administered 2016-09-29: 19:00:00 via INTRAVENOUS

## 2016-09-29 MED ORDER — ONDANSETRON HCL 4 MG/2ML IJ SOLN: 4.0000 mg | Freq: Four times a day (QID) | INTRAMUSCULAR | Status: DC | PRN

## 2016-09-29 MED ORDER — MOMETASONE FURO-FORMOTEROL FUM 100-5 MCG/ACT IN AERO
2.0000 | INHALATION_SPRAY | Freq: Two times a day (BID) | RESPIRATORY_TRACT | Status: DC
  Administered 2016-09-30: 2 via RESPIRATORY_TRACT
  Filled 2016-09-29: qty 8.8

## 2016-09-29 MED ORDER — HYDROCODONE-ACETAMINOPHEN 5-325 MG PO TABS
1.0000 | ORAL_TABLET | ORAL | Status: DC | PRN
  Administered 2016-09-30 (×2): 1 via ORAL
  Filled 2016-09-29 (×2): qty 1

## 2016-09-29 MED ORDER — LORAZEPAM 0.5 MG PO TABS
0.5000 mg | ORAL_TABLET | Freq: Every day | ORAL | Status: DC
  Administered 2016-09-29: 0.5 mg via ORAL
  Filled 2016-09-29: qty 1

## 2016-09-29 MED ORDER — BISACODYL 5 MG PO TBEC: 5.0000 mg | DELAYED_RELEASE_TABLET | Freq: Every day | ORAL | Status: DC | PRN

## 2016-09-29 MED ORDER — KETOROLAC TROMETHAMINE 15 MG/ML IJ SOLN: 15.0000 mg | Freq: Four times a day (QID) | INTRAMUSCULAR | Status: DC | PRN

## 2016-09-29 MED ORDER — HYDRALAZINE HCL 20 MG/ML IJ SOLN: 10.0000 mg | Freq: Four times a day (QID) | INTRAMUSCULAR | Status: DC | PRN

## 2016-09-29 MED ORDER — SODIUM CHLORIDE 0.9 % IV BOLUS (SEPSIS)
1000.0000 mL | Freq: Once | INTRAVENOUS | Status: AC
  Administered 2016-09-29: 1000 mL via INTRAVENOUS

## 2016-09-29 MED ORDER — ASPIRIN 81 MG PO CHEW
81.0000 mg | CHEWABLE_TABLET | Freq: Every day | ORAL | Status: DC
  Administered 2016-09-30: 81 mg via ORAL
  Filled 2016-09-29: qty 1

## 2016-09-29 MED ORDER — ASPIRIN 81 MG PO CHEW
324.0000 mg | CHEWABLE_TABLET | Freq: Once | ORAL | Status: AC
  Administered 2016-09-29: 324 mg via ORAL

## 2016-09-29 NOTE — ED Triage Notes (Signed)
Patient c/o left sided abdominal pain since Monday. Patient reports some nausea.  Patient reports small bowel movement yesterday morning.

## 2016-09-29 NOTE — ED Triage Notes (Signed)
Pt comes from [facility] urgent care.  Pt states having weakness and abd pain since Monday.  Pt complained of chest pain, nausea, and ShOB.  EKG done and showed left bundle block.  4 baby aspirin given by EMS and transported to our ED.  Pt was diagnosed with leukemia lymphoma but has not received treatment.

## 2016-09-29 NOTE — H&P (Signed)
[place] at [place] NAME: [name]    MR#:  [id number]  DATE OF BIRTH:  08-29-1940  DATE OF ADMISSION:  09/29/2016  PRIMARY CARE PHYSICIAN: [name], MD   REQUESTING/REFERRING PHYSICIAN:  Dr Alfred Levins  CHIEF COMPLAINT:   Chest pain and abdominal pain HISTORY OF PRESENT ILLNESS:  [name]  is a 76 y.o. female with a known history of Left bundle branch block, CLL and peptic ulcer disease who presents with above complaint. Patient was seen at [facility] urgent care early this morning complaining of abdominal pain and bloating over the past 2 days. She reports that this is in the left lower quadrant. She rates it as an 8/10 She denies diarrhea, constipation or fevers.  She is also complaining of midsternal chest pain radiating to her arm with diaphoresis and nausea. She has had several episodes that last minutes over the past 2 days. This is completely separate than her left lower quadrant pain. She has no alleviating or aggravating factors.  At [facility] urgent care EKG was performed which showed left bundle bunch block which was thought to be new however patient has history of left bundle-branch block.  She is currently chest pain-free.   PAST MEDICAL HISTORY:   Past Medical History:  Diagnosis Date  . Acute diarrhea 04/08/2014  . Anxiety   . Asthma   . 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 Cancer ([place])   . Chronic migraine without aura 03/13/2014   Last Assessment & Plan:  Headaches stable generally   . Depression   . Fibrocystic disease of breast   . GERD (gastroesophageal reflux disease)   . Hiatal hernia   . Hyperlipidemia   . Osteoporosis   . Peptic ulcer disease   . Psoriasis   . Renal stone   . Syncope and collapse 10/29/2009   Qualifier: Diagnosis of  By: Rockey Situ MD, Tim      PAST SURGICAL HISTORY:   Past Surgical History:  Procedure Laterality Date  . APPENDECTOMY    . BREAST EXCISIONAL BIOPSY Right  2005   neg  . BREAST EXCISIONAL BIOPSY Right 1990   neg  . HERNIA REPAIR    . PARTIAL HYSTERECTOMY    . VESICOVAGINAL FISTULA CLOSURE W/ TAH      SOCIAL HISTORY:   Social History  Substance Use Topics  . Smoking status: Never Smoker  . Smokeless tobacco: Never Used  . Alcohol use No    FAMILY HISTORY:   Family History  Problem Relation Age of Onset  . Hematuria Father   . Congestive Heart Failure Father   . Dementia Mother   . Congestive Heart Failure Mother   . Neuropathy Brother   . Coronary artery disease Other   . Breast cancer Maternal Aunt   . Breast cancer Paternal Aunt   . Breast cancer Maternal Aunt     DRUG ALLERGIES:  No Known Allergies  REVIEW OF SYSTEMS:   Review of Systems  Constitutional: Negative.  Negative for chills, fever and malaise/fatigue.  HENT: Negative.  Negative for ear discharge, ear pain, hearing loss, nosebleeds and sore throat.   Eyes: Negative.  Negative for blurred vision and pain.  Respiratory: Negative.  Negative for cough, hemoptysis, shortness of breath and wheezing.   Cardiovascular: Positive for chest pain. Negative for palpitations and leg swelling.  Gastrointestinal: Positive for abdominal pain and nausea. Negative for blood in stool, diarrhea and vomiting.  Genitourinary: Negative.  Negative for  dysuria.  Musculoskeletal: Negative.  Negative for back pain.  Skin: Negative.   Neurological: Negative for dizziness, tremors, speech change, focal weakness, seizures and headaches.  Endo/Heme/Allergies: Negative.  Does not bruise/bleed easily.  Psychiatric/Behavioral: Negative.  Negative for depression, hallucinations and suicidal ideas.    MEDICATIONS AT HOME:   Prior to Admission medications   Medication Sig Start Date End Date Taking? Authorizing Provider  azelastine (ASTELIN) 0.1 % nasal spray Place 1 spray into both nostrils daily as needed.  01/31/16 01/30/17 Yes [provider]  conjugated estrogens (PREMARIN)  vaginal cream Place 1 Applicatorful vaginally.  02/09/16 02/08/17 Yes [provider]  ferrous sulfate 325 (65 FE) MG tablet Take 325 mg by mouth at bedtime.   Yes [provider]  Fluticasone-Salmeterol (ADVAIR) 100-50 MCG/DOSE AEPB Inhale 1 puff into the lungs as needed.   Yes [provider]  gabapentin (NEURONTIN) 300 MG capsule Take 1 capsule (300 mg total) by mouth 3 (three) times daily. 07/15/15  Yes Kathrynn Ducking, MD  LORazepam (ATIVAN) 0.5 MG tablet Take 0.5 mg by mouth at bedtime.    Yes [provider]  Multiple Vitamins-Minerals (CENTRUM WOMEN) TABS Take 1 tablet by mouth at bedtime.   Yes [provider]  pantoprazole (PROTONIX) 40 MG tablet Take 40 mg by mouth 2 (two) times daily.  05/28/15  Yes [provider]  pravastatin (PRAVACHOL) 80 MG tablet Take 40 mg by mouth at bedtime.  07/26/15  Yes [provider]  sertraline (ZOLOFT) 50 MG tablet Take 50 mg by mouth daily.  06/01/15  Yes [provider]  topiramate (TOPAMAX) 25 MG tablet Take 25 mg by mouth 2 (two) times daily. 08/30/16 08/30/17 Yes [provider]      VITAL SIGNS:  Blood pressure (!) 146/65, pulse 68, resp. rate 20, height 5' 6.5" (1.689 m), weight 81.2 kg (179 lb), SpO2 97 %.  PHYSICAL EXAMINATION:   Physical Exam  Constitutional: She is oriented to person, place, and time and well-developed, well-nourished, and in no distress. No distress.  HENT:  Head: Normocephalic.  Eyes: No scleral icterus.  Neck: Normal range of motion. Neck supple. No JVD present. No tracheal deviation present.  Cardiovascular: Normal rate, regular rhythm and normal heart sounds.  Exam reveals no gallop and no friction rub.   No murmur heard. Pulmonary/Chest: Effort normal and breath sounds normal. No respiratory distress. She has no wheezes. She has no rales. She exhibits no tenderness.  Abdominal: Soft. Bowel sounds are normal. She exhibits no distension and  no mass. There is no tenderness. There is no rebound and no guarding.  Musculoskeletal: Normal range of motion. She exhibits no edema.  Neurological: She is alert and oriented to person, place, and time.  Skin: Skin is warm. No rash noted. No erythema.  Psychiatric: Affect and judgment normal.      LABORATORY PANEL:   CBC  Recent Labs Lab 09/29/16 1201  WBC 11.1*  HGB 12.2  HCT 35.0  PLT 145*   ------------------------------------------------------------------------------------------------------------------  Chemistries   Recent Labs Lab 09/29/16 1201  NA 136  K 3.8  CL 103  CO2 25  GLUCOSE 125*  BUN 29*  CREATININE 1.60*  CALCIUM 9.1  AST 18  ALT 16  ALKPHOS 82  BILITOT 1.3*   ------------------------------------------------------------------------------------------------------------------  Cardiac Enzymes  Recent Labs Lab 09/29/16 1201  TROPONINI <0.03   ------------------------------------------------------------------------------------------------------------------  RADIOLOGY:  No results found.  EKG:  Left bundle branch block  IMPRESSION AND PLAN:  76 year old female with history of left bundle-branch block who presents with left lower quadrant abdominal pain as well as unstable angina.  1. Unstable angina: Admit to telemetry Cycle cardiac enzymes Cardiology consultation requested as per her patient family [facility] Aspirin and metoprolol started Continue statin and check lipid panel Myoview in a.m. if troponins are negative.  2. Abdominal pain: Follow-up on CT scan of the abdomen If this is Diverticulitis , I  will add antibiotics.  3. History of PUD: Continue PPI  4. Depression: Continue Zoloft  5. Acute kidney injury: Start IV fluids and repeat BMP in a.m.  6. Elevated blood pressure without diagnosis of hypertension primarily due to pain Started metoprolol due to problem #1. Follow blood pressure  All the records are reviewed and  case discussed with ED provider. Management plans discussed with the patient and she is in agreement  CODE STATUS: DNR  TOTAL TIME TAKING CARE OF THIS PATIENT: 45 minutes.    Kairie Vangieson M.D on 09/29/2016 at 1:35 PM  Between 7am to 6pm - Pager - [phone]  After 6pm go to [email or web address] - password [email or web address] [facility]  Office  [phone]  CC: Primary care physician; [name], MD

## 2016-09-29 NOTE — ED Provider Notes (Signed)
[facility] Emergency Department Provider Note  ____________________________________________  Time seen: Approximately 1:17 PM  I have reviewed the triage vital signs and the nursing notes.   HISTORY  Chief Complaint Chest Pain   HPI [name] is a 76 y.o. 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 Patientwent to urgent care complaining of 4 days of left-sided abdominal pain that she describes as moderate, sharp, constant and nonradiating. Since yesterday evening patient started having chest pain as well that she describes as a chest pressure located in the center of her chest associated with nausea, dizziness, diaphoresis. She went to urgent care today and had an EKG done which showed left bundle branch block and patient was sent to the emergency room for further evaluation. Patient denies any chest pain upon arrival and has an inch of nitro paste on her chest. She also received 324 mg of aspirin per EMS. Patient denies personal history of ischemic heart disease however both her parents had heart attacks. She has never smoked. She has no other medical problems other than her recently diagnosed CLL. Patient also reports that she's been having several episodes of diarrhea which is watery and grey in color for the last 4 days. She has had no fever, chills, dysuria, hematuria, shortness of breath, URI symptoms, vomiting. No prior abdominal surgeries.  Past Medical History:  Diagnosis Date  . Acute diarrhea 04/08/2014  . Anxiety   . Asthma   . 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 Cancer ([place])   . Chronic migraine without aura 03/13/2014   Last Assessment & Plan:  Headaches stable generally   . Depression   . Fibrocystic disease of breast   . GERD (gastroesophageal reflux disease)   . Hiatal hernia   . Hyperlipidemia   .  Osteoporosis   . Peptic ulcer disease   . Psoriasis   . Renal stone   . Syncope and collapse 10/29/2009   Qualifier: Diagnosis of  By: Rockey Situ MD, Tim      Patient Active Problem List   Diagnosis Date Noted  . Abdominal pain 09/29/2016  . CLL (chronic lymphocytic leukemia) ([place]) 03/15/2016  . Low back pain 04/28/2015  . H/O adenomatous polyp of colon 04/30/2014  . Abdominal pain, epigastric 04/08/2014  . Acute diarrhea 04/08/2014  . Blood in feces 04/08/2014  . Chronic migraine without aura 03/13/2014  . 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 Hypercholesterolemia 02/05/2014  . 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 SYNCOPE AND COLLAPSE 10/29/2009  . CHEST PAIN UNSPECIFIED 10/29/2009    Past Surgical History:  Procedure Laterality Date  . APPENDECTOMY    . BREAST EXCISIONAL BIOPSY Right 2005   neg  . BREAST EXCISIONAL BIOPSY Right 1990   neg  . HERNIA REPAIR    . PARTIAL HYSTERECTOMY    . VESICOVAGINAL FISTULA CLOSURE W/ TAH      Prior to Admission medications   Medication Sig Start Date End Date Taking? Authorizing Provider  azelastine (ASTELIN) 0.1 % nasal spray Place 1 spray into both nostrils daily as needed.  01/31/16 01/30/17 Yes [provider]  conjugated estrogens (PREMARIN) vaginal cream Place 1 Applicatorful vaginally.  02/09/16 02/08/17 Yes [provider]  ferrous sulfate 325 (65 FE) MG tablet Take 325 mg by mouth at bedtime.   Yes [provider]  Fluticasone-Salmeterol (  ADVAIR) 100-50 MCG/DOSE AEPB Inhale 1 puff into the lungs as needed.   Yes [provider]  gabapentin (NEURONTIN) 300 MG capsule Take 1 capsule (300 mg total) by mouth 3 (three) times daily. 07/15/15  Yes Kathrynn Ducking, MD  LORazepam (ATIVAN) 0.5 MG tablet Take 0.5 mg by mouth at bedtime.    Yes [provider]  Multiple Vitamins-Minerals (CENTRUM WOMEN) TABS Take 1 tablet by mouth at bedtime.   Yes [provider]  pantoprazole (PROTONIX) 40 MG tablet Take 40 mg by mouth 2 (two) times daily.  05/28/15  Yes [provider]  pravastatin (PRAVACHOL) 80 MG tablet Take 40 mg by mouth at bedtime.  07/26/15  Yes [provider]  sertraline (ZOLOFT) 50 MG tablet Take 50 mg by mouth daily.  06/01/15  Yes [provider]  topiramate (TOPAMAX) 25 MG tablet Take 25 mg by mouth 2 (two) times daily. 08/30/16 08/30/17 Yes [provider]    Allergies Patient has no known allergies.  Family History  Problem Relation Age of Onset  . Hematuria Father   . Congestive Heart Failure Father   . Dementia Mother   . Congestive Heart Failure Mother   . Neuropathy Brother   . Coronary artery disease Other   . Breast cancer Maternal Aunt   . Breast cancer Paternal Aunt   . Breast cancer Maternal Aunt     Social History Social History  Substance Use Topics  . Smoking status: Never Smoker  . Smokeless tobacco: Never Used  . Alcohol use No    Review of Systems  Constitutional: Negative for fever. Eyes: Negative for visual changes. ENT: Negative for sore throat. Neck: No neck pain  Cardiovascular: + chest pain. Respiratory: Negative for shortness of breath. Gastrointestinal: + Left sided abdominal pain and diarrhea. No vomiting or diarrhea. Genitourinary: Negative for dysuria. Musculoskeletal: Negative for back pain. Skin: Negative for rash. Neurological: Negative for headaches, weakness or numbness. Psych: No SI or HI  ____________________________________________   PHYSICAL EXAM:  VITAL SIGNS: ED Triage Vitals [09/29/16 1205]  Enc Vitals Group     BP (!) 168/72     Pulse Rate 81     Resp 19     Temp      Temp src      SpO2 95 %     Weight 179 lb (81.2 kg)     Height 5' 6.5" (1.689 m)     Head Circumference      Peak Flow      Pain Score      Pain Loc      Pain Edu?      Excl. in [place]?     Constitutional: Alert and oriented. Well appearing and in  no apparent distress. HEENT:      Head: Normocephalic and atraumatic.         Eyes: Conjunctivae are normal. Sclera is non-icteric.       Mouth/Throat: Mucous membranes are moist.       Neck: Supple with no signs of meningismus. Cardiovascular: Regular rate and rhythm. No murmurs, gallops, or rubs. 2+ symmetrical distal pulses are present in all extremities. No JVD. Respiratory: Normal respiratory effort. Lungs are clear to auscultation bilaterally. No wheezes, crackles, or rhonchi.  Gastrointestinal: Soft, ttp over the LLQ, and non distended with positive bowel sounds. No rebound or guarding. Genitourinary: No CVA tenderness. Musculoskeletal: Nontender with normal range of motion in all extremities. No edema, cyanosis, or erythema of extremities. Neurologic:  Normal speech and language. Face is symmetric. Moving all extremities. No gross focal neurologic deficits are appreciated. Skin: Skin is warm, dry and intact. No rash noted. Psychiatric: Mood and affect are normal. Speech and behavior are normal.  ____________________________________________   LABS (all labs ordered are listed, but only abnormal results are displayed)  Labs Reviewed  CBC WITH DIFFERENTIAL/PLATELET - Abnormal; Notable for the following:       Result Value   WBC 11.1 (*)    RBC 3.77 (*)    RDW 16.4 (*)    Platelets 145 (*)    Lymphs Abs 7.3 (*)    All other components within normal limits  COMPREHENSIVE METABOLIC PANEL - Abnormal; Notable for the following:    Glucose, Bld 125 (*)    BUN 29 (*)    Creatinine, Ser 1.60 (*)    Total Bilirubin 1.3 (*)    GFR calc non Af Amer 30 (*)    GFR calc Af Amer 35 (*)    All other components within normal limits  LIPID PANEL - Abnormal; Notable for the following:    LDL Cholesterol 108 (*)    All other components within normal limits  LIPID PANEL - Abnormal; Notable for the following:    LDL Cholesterol 100 (*)    All other components within normal limits    PROTIME-INR  APTT  TROPONIN I  CBC  TROPONIN I  TROPONIN I   ____________________________________________  EKG  ED ECG REPORT I, Rudene Re, the attending physician, personally viewed and interpreted this ECG.  Normal sinus rhythm, rate of 69, first-degree AV block, left bundle branch block, normal QTC, normal axis, T-wave inversions in inferior and lateral leads with no ST elevations. All of these changes are new when compared to prior from 2011. ____________________________________________  RADIOLOGY  CT ap:  Large hiatal hernia. Proximal sigmoid diverticulitis without abscess formation. ____________________________________________   PROCEDURES  Procedure(s) performed: None Procedures Critical Care performed:  None ____________________________________________   INITIAL IMPRESSION / ASSESSMENT AND PLAN / ED COURSE   76 y.o. 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  # abdominal pain: Patient with left lower quadrant tenderness on exam and a CT concerning for uncomplicated diverticulitis. She was given Zosyn in the emergency room. Patient with AKi and given 1L bolus.  # chest pain: Patient's description of her chest pain concerning for anginal pain especially in the setting of recent stressor with diverticulitis and diarrhea. Her EKG shows a left bundle branch block however review of Epic shows a note from patient's cardiologist Dr. Ubaldo Glassing where he documents the patient has a chronic left bundle branch block since at least 2015. I discussed the patient's presentation, and EKG with Dr. Fletcher Anon, cardiologist on-call for the catheter lab and the decision was made not to activate a code STEMI since patient was asymptomatic and had documentation of  pre-existing left bundle-branch block. The patient will therefore be admitted to the hospitalist service for further cardiac evaluation.     Pertinent labs & imaging results that were available during my care of the patient were reviewed by me and considered in my medical decision making (see chart for details).    ____________________________________________   FINAL CLINICAL IMPRESSION(S) / ED DIAGNOSES  Final diagnoses:  Diverticulitis  Chest pain, unspecified type      NEW MEDICATIONS STARTED DURING THIS VISIT:  New Prescriptions   No medications on file     Note:  This document was prepared using Dragon voice recognition software and may include unintentional dictation errors.    Alfred Levins, [place], MD 09/29/16 [id number]

## 2016-09-29 NOTE — Progress Notes (Signed)
Dr Nehemiah Massed instructed RN to discontinue myoview if 2nd troponin is negative. I will continue to assess.

## 2016-09-29 NOTE — ED Provider Notes (Signed)
[facility]    CSN: [id number] Arrival date & time: 09/29/16  0930     History   Chief Complaint Chief Complaint  Patient presents with  . Abdominal Pain    HPI [name] is a 76 y.o. female. She presents today with bloating and abdominal discomfort since 5/28.  The discomfort is in the left upper quadrant and epigastrium, radiates down to the left lower quadrant. Has been having a little more difficulty with loose stool than usual, gray color. No urinary frequency/urgency, no dysuria. States her urine is dark yellow. No unusual vaginal discharge or bleeding. Has early satiety. Not eating much. Nausea, but no vomiting. Symptoms in the last day or 2 has been keeping her from doing her usual activities, she's been kind of laying around. No fever. Medical history includes CLL, hyperlipidemia. Has a hiatal hernia. Patient is a little worried about a cardiac source.   HPI  Past Medical History:  Diagnosis Date  . Acute diarrhea 04/08/2014  . Anxiety   . Asthma   . 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 Cancer ([place])   . Chronic migraine without aura 03/13/2014   Last Assessment & Plan:  Headaches stable generally   . Depression   . Fibrocystic disease of breast   . GERD (gastroesophageal reflux disease)   . Hiatal hernia   . Hyperlipidemia   . Osteoporosis   . Peptic ulcer disease   . Psoriasis   . Renal stone   . Syncope and collapse 10/29/2009   Qualifier: Diagnosis of  By: Rockey Situ MD, Tim      Patient Active Problem List   Diagnosis Date Noted  . CLL (chronic lymphocytic leukemia) ([place]) 03/15/2016  . Low back pain 04/28/2015  . H/O adenomatous polyp of colon 04/30/2014  . Abdominal pain, epigastric 04/08/2014  . Acute diarrhea 04/08/2014  . Blood in feces 04/08/2014  . Chronic migraine without aura 03/13/2014  . 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 Hypercholesterolemia 02/05/2014  . Block, bundle branch, left  02/05/2014  . SYNCOPE AND COLLAPSE 10/29/2009  . CHEST PAIN UNSPECIFIED 10/29/2009    Past Surgical History:  Procedure Laterality Date  . APPENDECTOMY    . BREAST EXCISIONAL BIOPSY Right 2005   neg  . BREAST EXCISIONAL BIOPSY Right 1990   neg  . HERNIA REPAIR    . PARTIAL HYSTERECTOMY    . VESICOVAGINAL FISTULA CLOSURE W/ TAH      OB History    No data available       Home Medications    Prior to Admission medications   Medication Sig Start Date End Date Taking? Authorizing Provider  azelastine (ASTELIN) 0.1 % nasal spray Place into the nose. 01/31/16 01/30/17  [provider]  conjugated estrogens (PREMARIN) vaginal cream Place vaginally. 02/09/16 02/08/17  [provider]  ferrous sulfate 325 (65 FE) MG tablet Take 325 mg by mouth at bedtime.    [provider]  Fluticasone-Salmeterol (ADVAIR) 100-50 MCG/DOSE AEPB Inhale 1 puff into the lungs as needed.    [provider]  gabapentin (NEURONTIN) 300 MG capsule Take 1 capsule (300 mg total) by mouth 3 (three) times daily. Patient not taking: Reported on 04/07/2016 07/15/15   Kathrynn Ducking, MD  HYDROcodone-acetaminophen (NORCO/VICODIN) 5-325 MG tablet Take by mouth. 06/30/15   [provider]  LORazepam (ATIVAN) 0.5 MG tablet Take 0.5 mg by mouth at bedtime.     [provider]  Multiple Vitamins-Minerals (  CENTRUM WOMEN) TABS Take 1 tablet by mouth at bedtime.    [provider]  pantoprazole (PROTONIX) 40 MG tablet Take 40 mg by mouth 2 (two) times daily.  05/28/15   [provider]  pravastatin (PRAVACHOL) 80 MG tablet  07/26/15   [provider]  sertraline (ZOLOFT) 50 MG tablet  06/01/15   [provider]    Family History Family History  Problem Relation Age of Onset  . Hematuria Father   . Congestive Heart Failure Father   . Dementia Mother   . Congestive Heart Failure Mother   . Neuropathy Brother   . Coronary artery disease  Other   . Breast cancer Maternal Aunt   . Breast cancer Paternal Aunt   . Breast cancer Maternal Aunt     Social History Social History  Substance Use Topics  . Smoking status: Never Smoker  . Smokeless tobacco: Never Used  . Alcohol use No     Allergies   Patient has no known allergies.   Review of Systems Review of Systems  All other systems reviewed and are negative.    Physical Exam Triage Vital Signs ED Triage Vitals  Enc Vitals Group     BP 09/29/16 1010 (!) 142/56     Pulse Rate 09/29/16 1010 72     Resp 09/29/16 1010 16     Temp 09/29/16 1010 98.1 F (36.7 C)     Temp Source 09/29/16 1010 Oral     SpO2 09/29/16 1010 98 %     Weight 09/29/16 1008 179 lb (81.2 kg)     Height 09/29/16 1008 5\' 6"  (1.676 m)     Pain Score 09/29/16 1009 6     Pain Loc --    Updated Vital Signs BP 124/70 (BP Location: Left Arm)   Pulse 73   Temp 98.1 F (36.7 C) (Oral)   Resp 16   Ht 5\' 6"  (1.676 m)   Wt 179 lb (81.2 kg)   SpO2 98%   BMI 28.89 kg/m   Physical Exam  Constitutional: She is oriented to person, place, and time. No distress.  HENT:  Head: Atraumatic.  Eyes:  Conjugate gaze observed, no eye redness/discharge  Neck: Neck supple.  Cardiovascular: Normal rate and regular rhythm.   Pulmonary/Chest: No respiratory distress. She has no wheezes. She has no rales.  Lungs clear, symmetric breath sounds  Abdominal: She exhibits no distension. There is no rebound and no guarding.  Maybe slightly distended, tender in the epigastrium, and in the left lower quadrant  Musculoskeletal: Normal range of motion.  Trace ankle edema, puffy, bilaterally  Neurological: She is alert and oriented to person, place, and time.  Skin: Skin is warm and dry.  Nursing note and vitals reviewed.    UC Treatments / Results   EKG ECG shows sinus rhythm with a new left bundle branch block since EKG of 10/29/2009, no ectopy  Procedures Procedures (including critical care  time)  Medications Ordered in UC Medications  aspirin chewable tablet 324 mg (324 mg Oral Given 09/29/16 1044)  nitroGLYCERIN (NITROGLYN) 2 % ointment 0.5 inch (0.5 inches Topical Given 09/29/16 1043)       Final Clinical Impressions(s) / UC Diagnoses   Final diagnoses:  ECG abnormality  Atypical chest pain   Patient will go by squad to [facility] ED, for further evaluation of new left bundle branch block in the setting of 4 days of atypical chest pain, interfering with activities for the last  2 days. She has had 4 baby aspirins and a half inch of Nitropaste.   Sherlene Shams, MD 09/29/16 1106

## 2016-09-29 NOTE — Consult Note (Signed)
[facility] Consultation Note  Patient ID: [name], MRN: [id number], DOB/AGE: Dec 31, 1940 76 y.o. Admit date: 09/29/2016   Date of Consult: 09/29/2016 Primary Physician: [name], MD Primary Cardiologist: Fath  Chief Complaint:  Chief Complaint  Patient presents with  . Chest Pain   Reason for Consult: left bundle branch block with atypical chest pain  HPI: 76 y.o. female with no evidence of significant  Cardiovascular risk factors who is had new onset of abdominal discomfort and left upper quadrant pain with some radiation into her chest lasting most of the day intermittently and associated with some diaphoresis. The patient also has some center back pain radiating into her chest wall consistent with rib pain and atypical in nature worsening with movement. Patient has had a EKG showing normal sinus rhythm with left bundle-branch block. This EKG finding is unchanged ffrom previous and no history of coronary artery disease. Currently the patient feels much better at this time and is having no significant symptoms. The patient does have a normal troponin without evidence of heart failure or myocardial infarction  Past Medical History:  Diagnosis Date  . Acute diarrhea 04/08/2014  . Anxiety   . Asthma   . 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 Cancer ([place])   . Chronic migraine without aura 03/13/2014   Last Assessment & Plan:  Headaches stable generally   . Depression   . Fibrocystic disease of breast   . GERD (gastroesophageal reflux disease)   . Hiatal hernia   . Hyperlipidemia   . Osteoporosis   . Peptic ulcer disease   . Psoriasis   . Renal stone   . Syncope and collapse 10/29/2009   Qualifier: Diagnosis of  By: Rockey Situ MD, Tim        Surgical History:  Past Surgical History:  Procedure Laterality Date  . APPENDECTOMY    . BREAST EXCISIONAL BIOPSY Right 2005   neg  . BREAST EXCISIONAL BIOPSY Right 1990   neg  . HERNIA REPAIR    . PARTIAL  HYSTERECTOMY    . VESICOVAGINAL FISTULA CLOSURE W/ TAH       Home Meds: Prior to Admission medications   Medication Sig Start Date End Date Taking? Authorizing Provider  azelastine (ASTELIN) 0.1 % nasal spray Place 1 spray into both nostrils daily as needed.  01/31/16 01/30/17 Yes [provider]  conjugated estrogens (PREMARIN) vaginal cream Place 1 Applicatorful vaginally.  02/09/16 02/08/17 Yes [provider]  ferrous sulfate 325 (65 FE) MG tablet Take 325 mg by mouth at bedtime.   Yes [provider]  Fluticasone-Salmeterol (ADVAIR) 100-50 MCG/DOSE AEPB Inhale 1 puff into the lungs as needed.   Yes [provider]  gabapentin (NEURONTIN) 300 MG capsule Take 1 capsule (300 mg total) by mouth 3 (three) times daily. 07/15/15  Yes Kathrynn Ducking, MD  LORazepam (ATIVAN) 0.5 MG tablet Take 0.5 mg by mouth at bedtime.    Yes [provider]  Multiple Vitamins-Minerals (CENTRUM WOMEN) TABS Take 1 tablet by mouth at bedtime.   Yes [provider]  pantoprazole (PROTONIX) 40 MG tablet Take 40 mg by mouth 2 (two) times daily.  05/28/15  Yes [provider]  pravastatin (PRAVACHOL) 80 MG tablet Take 40 mg by mouth at bedtime.  07/26/15  Yes [provider]  sertraline (ZOLOFT) 50 MG tablet Take 50 mg by mouth daily.  06/01/15  Yes [provider]  topiramate (TOPAMAX) 25 MG tablet Take 25 mg  by mouth 2 (two) times daily. 08/30/16 08/30/17 Yes [provider]    Inpatient Medications:  . aspirin  81 mg Oral Daily  . enoxaparin (LOVENOX) injection  40 mg Subcutaneous Q24H  . ferrous sulfate  325 mg Oral QHS  . gabapentin  300 mg Oral TID  . LORazepam  0.5 mg Oral QHS  . metoprolol tartrate  25 mg Oral BID  . mometasone-formoterol  2 puff Inhalation BID  . multivitamin with minerals  1 tablet Oral QHS  . pantoprazole  40 mg Oral BID  . pravastatin  40 mg Oral QHS  . sertraline  50 mg Oral Daily  . topiramate  25  mg Oral BID   . sodium chloride    . piperacillin-tazobactam (ZOSYN)  IV    . sodium chloride      Allergies: No Known Allergies  Social History   Social History  . Marital status: Widowed    Spouse name: N/A  . Number of children: 1  . Years of education: some coll.   Occupational History  . retired    Social History Main Topics  . Smoking status: Never Smoker  . Smokeless tobacco: Never Used  . Alcohol use No  . Drug use: No  . Sexual activity: Not on file   Other Topics Concern  . Not on file   Social History Narrative   Married   Patient occasionally drinks caffeine.   Patient is right handed.      Family History  Problem Relation Age of Onset  . Hematuria Father   . Congestive Heart Failure Father   . Dementia Mother   . Congestive Heart Failure Mother   . Neuropathy Brother   . Coronary artery disease Other   . Breast cancer Maternal Aunt   . Breast cancer Paternal Aunt   . Breast cancer Maternal Aunt      Review of Systems Positive for bdominal pain Negative for: General:  chills, fever, night sweats or weight changes.  Cardiovascular: PND orthopnea syncope dizziness  Dermatological skin lesions rashes Respiratory: Cough congestion Urologic: Frequent urination urination at night and hematuria Abdominal: negative for nausea, vomiting, diarrhea, bright red blood per rectum, melena, or hematemesis Neurologic: negative for visual changes, and/or hearing changes  All other systems reviewed and are otherwise negative except as noted above.  Labs:  Recent Labs  09/29/16 1201 09/29/16 1549  TROPONINI <0.03 <0.03   Lab Results  Component Value Date   WBC 11.1 (H) 09/29/2016   HGB 12.2 09/29/2016   HCT 35.0 09/29/2016   MCV 92.8 09/29/2016   PLT 145 (L) 09/29/2016    Recent Labs Lab 09/29/16 1201  NA 136  K 3.8  CL 103  CO2 25  BUN 29*  CREATININE 1.60*  CALCIUM 9.1  PROT 7.2  BILITOT 1.3*  ALKPHOS 82  ALT 16  AST 18  GLUCOSE  125*   Lab Results  Component Value Date   CHOL 179 09/29/2016   CHOL 170 09/29/2016   HDL 42 09/29/2016   HDL 43 09/29/2016   LDLCALC 108 (H) 09/29/2016   LDLCALC 100 (H) 09/29/2016   TRIG 143 09/29/2016   TRIG 137 09/29/2016   No results found for: DDIMER  Radiology/Studies:  Ct Renal Stone Study  Result Date: 09/29/2016 CLINICAL DATA:  Left lower quadrant abdominal pain. EXAM: CT ABDOMEN AND PELVIS WITHOUT CONTRAST TECHNIQUE: Multidetector CT imaging of the abdomen and pelvis was performed following the standard protocol without IV contrast. COMPARISON:  CT scan of March 01, 2015. FINDINGS: Lower chest: Visualized lung bases appear clear. Large hiatal hernia is noted. Hepatobiliary: No focal liver abnormality is seen. No gallstones, gallbladder wall thickening, or biliary dilatation. Pancreas: Unremarkable. No pancreatic ductal dilatation or surrounding inflammatory changes. Spleen: Normal in size without focal abnormality. Adrenals/Urinary Tract: Adrenal glands are unremarkable. Kidneys are normal, without renal calculi, focal lesion, or hydronephrosis. Bladder is unremarkable. Stomach/Bowel: There is no evidence of bowel obstruction. Focal diverticulitis of the proximal sigmoid colon is noted. The appendix is not visualized, but no inflammation is noted in the right lower quadrant. Vascular/Lymphatic: Aortic atherosclerosis. No enlarged abdominal or pelvic lymph nodes. Reproductive: Status post hysterectomy. No adnexal masses. Other: No abdominal wall hernia or abnormality. No abdominopelvic ascites. Musculoskeletal: No acute or significant osseous findings. IMPRESSION: Large hiatal hernia. Proximal sigmoid diverticulitis without abscess formation. Electronically Signed   By: Marijo Conception, M.D.   On: 09/29/2016 13:54    EKG: normal sinus rhythm with left bundle-branch block  Weights: Filed Weights   09/29/16 1205 09/29/16 1532  Weight: 81.2 kg (179 lb) 83.8 kg (184 lb 11.2 oz)      Physical Exam: Blood pressure (!) 147/74, pulse 84, temperature 97.5 F (36.4 C), temperature source Oral, resp. rate 14, height 5' 6.5" (1.689 m), weight 83.8 kg (184 lb 11.2 oz), SpO2 96 %. Body mass index is 29.36 kg/m. General: Well developed, well nourished, in no acute distress. Head eyes ears nose throat: Normocephalic, atraumatic, sclera non-icteric, no xanthomas, nares are without discharge. No apparent thyromegaly and/or mass  Lungs: Normal respiratory effort.  no wheezes, no rales, no rhonchi.  Heart: RRR with normal S1 S2. no murmur gallop, no rub, PMI is normal size and placement, carotid upstroke normal without bruit, jugular venous pressure is normal Abdomen: Soft, non-tender, non-distended with normoactive bowel sounds. No hepatomegaly. No rebound/guarding. No obvious abdominal masses. Abdominal aorta is normal size without bruit Extremities: No edema. no cyanosis, no clubbing, no ulcers  Peripheral : 2+ bilateral upper extremity pulses, 2+ bilateral femoral pulses, 2+ bilateral dorsal pedal pulse Neuro: Alert and oriented. No facial asymmetry. No focal deficit. Moves all extremities spontaneously. Musculoskeletal: Normal muscle tone without kyphosis Psych:  Responds to questions appropriately with a normal affect.    Assessment: 76 year old female with atypical abdominal and chest discomfort without evidence of heart failure or myocardial infarction with a long-stande KG change of left buanch block at low risk for cardiovascular complication  Plan: 1. Continue to follow for significance of abdominal pain and shortness of breath and chest pain 2. No further cardiac workup or other diagnostic testing at this time necessary 3. Serial troponin levels to assess for possible elevation and further treatment options as necessary  4. Ambulation and follow for improvements  Signed, Corey Skains M.D. [facility] 09/29/2016, 4:59 PM

## 2016-09-29 NOTE — ED Notes (Signed)
Helped patient use restroom

## 2016-09-29 NOTE — ED Notes (Signed)
EMS called to transport patient to [facility] ED 

## 2016-09-29 NOTE — Progress Notes (Signed)
Pharmacy Antibiotic Note  [name] is a 76 y.o. female admitted on 09/29/2016 with intra-abdominal infection.  Pharmacy has been consulted for piperacillin/tazo dosing.  Plan: Begin piperacillin/tazobactam 3.375 g IV q 8 hours  Height: 5' 6.5" (168.9 cm) Weight: 184 lb 11.2 oz (83.8 kg) IBW/kg (Calculated) : 60.45  Temp (24hrs), Avg:97.8 F (36.6 C), Min:97.5 F (36.4 C), Max:98.1 F (36.7 C)   Recent Labs Lab 09/29/16 1201  WBC 11.1*  CREATININE 1.60*    Estimated Creatinine Clearance: 32.4 mL/min (A) (by C-G formula based on SCr of 1.6 mg/dL (H)).    No Known Allergies  Antimicrobials this admission: Piperacillin/tazo 6/1 >>   Dose adjustments this admission:  Microbiology results:  BCx:   UCx:    Sputum:    MRSA PCR:   Thank you for allowing pharmacy to be a part of this patient's care.  Darrow Bussing, PharmD Pharmacy Resident 09/29/2016 4:43 PM

## 2016-09-30 DIAGNOSIS — K5792 Diverticulitis of intestine, part unspecified, without perforation or abscess without bleeding: Secondary | ICD-10-CM | POA: Diagnosis not present

## 2016-09-30 DIAGNOSIS — K5732 Diverticulitis of large intestine without perforation or abscess without bleeding: Secondary | ICD-10-CM | POA: Diagnosis not present

## 2016-09-30 LAB — BASIC METABOLIC PANEL
Anion gap: 6 (ref 5–15)
BUN: 25 mg/dL — AB (ref 6–20)
CHLORIDE: 109 mmol/L (ref 101–111)
CO2: 25 mmol/L (ref 22–32)
CREATININE: 1.02 mg/dL — AB (ref 0.44–1.00)
Calcium: 8.7 mg/dL — ABNORMAL LOW (ref 8.9–10.3)
GFR calc non Af Amer: 52 mL/min — ABNORMAL LOW (ref >60)
GFR, EST AFRICAN AMERICAN: 60 mL/min — AB (ref >60)
Glucose, Bld: 110 mg/dL — ABNORMAL HIGH (ref 65–99)
Potassium: 4 mmol/L (ref 3.5–5.1)
SODIUM: 140 mmol/L (ref 135–145)

## 2016-09-30 LAB — CBC
HCT: 30.7 % — ABNORMAL LOW (ref 35.0–47.0)
HEMOGLOBIN: 10.9 g/dL — AB (ref 12.0–16.0)
MCH: 32.7 pg (ref 26.0–34.0)
MCHC: 35.5 g/dL (ref 32.0–36.0)
MCV: 92.3 fL (ref 80.0–100.0)
Platelets: 146 10*3/uL — ABNORMAL LOW (ref 150–440)
RBC: 3.33 MIL/uL — AB (ref 3.80–5.20)
RDW: 16 % — ABNORMAL HIGH (ref 11.5–14.5)
WBC: 7.6 10*3/uL (ref 3.6–11.0)

## 2016-09-30 LAB — TROPONIN I

## 2016-09-30 MED ORDER — SENNOSIDES-DOCUSATE SODIUM 8.6-50 MG PO TABS: 1.0000 | ORAL_TABLET | Freq: Every evening | ORAL | Status: DC | PRN

## 2016-09-30 MED ORDER — ACETAMINOPHEN 325 MG PO TABS: 650.0000 mg | ORAL_TABLET | Freq: Four times a day (QID) | ORAL | Status: DC | PRN

## 2016-09-30 MED ORDER — METOPROLOL TARTRATE 25 MG PO TABS: 12.5000 mg | ORAL_TABLET | Freq: Two times a day (BID) | ORAL | 0 refills | Status: DC

## 2016-09-30 MED ORDER — METOPROLOL TARTRATE 25 MG PO TABS: 12.5000 mg | ORAL_TABLET | Freq: Two times a day (BID) | ORAL | Status: DC

## 2016-09-30 MED ORDER — LEVOFLOXACIN 500 MG PO TABS: 500.0000 mg | ORAL_TABLET | Freq: Every day | ORAL | 0 refills | Status: AC

## 2016-09-30 MED ORDER — HYDROCODONE-ACETAMINOPHEN 5-325 MG PO TABS: 1.0000 | ORAL_TABLET | ORAL | 0 refills | Status: DC | PRN

## 2016-09-30 NOTE — Discharge Summary (Signed)
[facility] [place] NAME: [name]    MR#:  [id number]  DATE OF BIRTH:  [birth year]  DATE OF ADMISSION:  09/29/2016 ADMITTING PHYSICIAN: Bettey Costa, MD  DATE OF DISCHARGE: 09/30/16 PRIMARY CARE PHYSICIAN: [name], MD    ADMISSION DIAGNOSIS:  Diverticulitis [K57.92] Chest pain [R07.9] Chest pain, unspecified type [R07.9]  DISCHARGE DIAGNOSIS:  Acute sigmoid diverticulitis Chest pain  SECONDARY DIAGNOSIS:   Past Medical History:  Diagnosis Date  . Acute diarrhea 04/08/2014  . Anxiety   . Asthma   . 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 Cancer ([place])   . Chronic migraine without aura 03/13/2014   Last Assessment & Plan:  Headaches stable generally   . Depression   . Fibrocystic disease of breast   . GERD (gastroesophageal reflux disease)   . Hiatal hernia   . Hyperlipidemia   . Osteoporosis   . Peptic ulcer disease   . Psoriasis   . Renal stone   . Syncope and collapse 10/29/2009   Qualifier: Diagnosis of  By: Rockey Situ MD, [facility] COURSE:  HPI [name]  is a 76 y.o. female with a known history of Left bundle branch block, CLL and peptic ulcer disease who presents with above complaint. Patient was seen at [facility] urgent care early this morning complaining of abdominal pain and bloating over the past 2 days. She reports that this is in the left lower quadrant. She rates it as an 8/10 She denies diarrhea, constipation or fevers.  She is also complaining of midsternal chest pain radiating to her arm with diaphoresis and nausea. She has had several episodes that last minutes over the past 2 days. This is completely separate than her left lower quadrant pain. She has no alleviating or aggravating factors.  At [facility] urgent care EKG was performed which showed left bundle bunch block which was thought to be new however patient has history of left bundle-branch block.  She is currently chest  pain-free.  1. Unstable angina: Acute MI ruled out with negative cardiac enzymes Okay to discharge patient from cardiology standpoint Myoview test canceled by the cardiology as second troponin is negative  2. Acute abdominal pain secondary to sigmoid diverticulitis  Clinically improved with IV Zosyn .Will discharge home with by mouth levofloxacin  Norco as needed  Status post IV antibiotics   3. History of PUD: Continue PPI  4. Depression: Continue Zoloft  5. Acute kidney injury: Improved with IV fluids creatinine 1.60-1.02  6. Elevated blood pressure with a new diagnosis of hypertension Small dose metoprolol 12.5 mg a started titrate as needed   DISCHARGE CONDITIONS:   STABLE  CONSULTS OBTAINED:  Treatment Team:  Corey Skains, MD   PROCEDURES  NONE   DRUG ALLERGIES:  No Known Allergies  DISCHARGE MEDICATIONS:   Current Discharge Medication List    START taking these medications   Details  acetaminophen (TYLENOL) 325 MG tablet Take 2 tablets (650 mg total) by mouth every 6 (six) hours as needed for mild pain (or Fever >/= 101).    HYDROcodone-acetaminophen (NORCO/VICODIN) 5-325 MG tablet Take 1-2 tablets by mouth every 4 (four) hours as needed for moderate pain. Qty: 15 tablet, Refills: 0    levofloxacin (LEVAQUIN) 500 MG tablet Take 1 tablet (500 mg total) by mouth daily. Qty: 7 tablet, Refills: 0    metoprolol tartrate (LOPRESSOR) 25 MG tablet Take 0.5 tablets (12.5 mg total) by  mouth 2 (two) times daily. Qty: 60 tablet, Refills: 0    senna-docusate (SENOKOT-S) 8.6-50 MG tablet Take 1 tablet by mouth at bedtime as needed for mild constipation.      CONTINUE these medications which have NOT CHANGED   Details  azelastine (ASTELIN) 0.1 % nasal spray Place 1 spray into both nostrils daily as needed.    Associated Diagnoses: CLL (chronic lymphocytic leukemia) (HCC)    conjugated estrogens (PREMARIN) vaginal cream Place 1 Applicatorful vaginally.     Associated Diagnoses: CLL (chronic lymphocytic leukemia) (HCC)    ferrous sulfate 325 (65 FE) MG tablet Take 325 mg by mouth at bedtime.    Fluticasone-Salmeterol (ADVAIR) 100-50 MCG/DOSE AEPB Inhale 1 puff into the lungs as needed.    gabapentin (NEURONTIN) 300 MG capsule Take 1 capsule (300 mg total) by mouth 3 (three) times daily. Qty: 90 capsule, Refills: 3    LORazepam (ATIVAN) 0.5 MG tablet Take 0.5 mg by mouth at bedtime.     Multiple Vitamins-Minerals (CENTRUM WOMEN) TABS Take 1 tablet by mouth at bedtime.    pantoprazole (PROTONIX) 40 MG tablet Take 40 mg by mouth 2 (two) times daily.     pravastatin (PRAVACHOL) 80 MG tablet Take 40 mg by mouth at bedtime.     sertraline (ZOLOFT) 50 MG tablet Take 50 mg by mouth daily.     topiramate (TOPAMAX) 25 MG tablet Take 25 mg by mouth 2 (two) times daily.         DISCHARGE INSTRUCTIONS:   Follow-up with primary care physician in a week  DIET:  Low salt  DISCHARGE CONDITION:  Stable  ACTIVITY:  Activity as tolerated  OXYGEN:  Home Oxygen: No.   Oxygen Delivery: room air  DISCHARGE LOCATION:  home   If you experience worsening of your admission symptoms, develop shortness of breath, life threatening emergency, suicidal or homicidal thoughts you must seek medical attention immediately by calling 911 or calling your MD immediately  if symptoms less severe.  You Must read complete instructions/literature along with all the possible adverse reactions/side effects for all the Medicines you take and that have been prescribed to you. Take any new Medicines after you have completely understood and accpet all the possible adverse reactions/side effects.   Please note  You were cared for by a hospitalist during your hospital stay. If you have any questions about your discharge medications or the care you received while you were in the hospital after you are discharged, you can call the unit and asked to speak with the  hospitalist on call if the hospitalist that took care of you is not available. Once you are discharged, your primary care physician will handle any further medical issues. Please note that NO REFILLS for any discharge medications will be authorized once you are discharged, as it is imperative that you return to your primary care physician (or establish a relationship with a primary care physician if you do not have one) for your aftercare needs so that they can reassess your need for medications and monitor your lab values.     Today  Chief Complaint  Patient presents with  . Chest Pain    Patient denies any chest pain tolerating diet abdominal pain significantly improved, wants to go home ROS:  CONSTITUTIONAL: Denies fevers, chills. Denies any fatigue, weakness.  EYES: Denies blurry vision, double vision, eye pain. EARS, NOSE, THROAT: Denies tinnitus, ear pain, hearing loss. RESPIRATORY: Denies cough, wheeze, shortness of breath.  CARDIOVASCULAR: Denies chest  pain, palpitations, edema.  GASTROINTESTINAL: Denies nausea, vomiting, diarrhea, abdominal pain. Denies bright red blood per rectum. GENITOURINARY: Denies dysuria, hematuria. ENDOCRINE: Denies nocturia or thyroid problems. HEMATOLOGIC AND LYMPHATIC: Denies easy bruising or bleeding. SKIN: Denies rash or lesion. MUSCULOSKELETAL: Denies pain in neck, back, shoulder, knees, hips or arthritic symptoms.  NEUROLOGIC: Denies paralysis, paresthesias.  PSYCHIATRIC: Denies anxiety or depressive symptoms.   VITAL SIGNS:  Blood pressure (!) 116/58, pulse (!) 51, temperature 97.5 F (36.4 C), temperature source Oral, resp. rate 18, height 5' 6.5" (1.689 m), weight 83.8 kg (184 lb 11.2 oz), SpO2 93 %.  I/O:    Intake/Output Summary (Last 24 hours) at 09/30/16 1317 Last data filed at 09/30/16 1011  Gross per 24 hour  Intake          1523.75 ml  Output              900 ml  Net           623.75 ml    PHYSICAL EXAMINATION:  GENERAL:   76 y.o.-year-old patient lying in the bed with no acute distress.  EYES: Pupils equal, round, reactive to light and accommodation. No scleral icterus. Extraocular muscles intact.  HEENT: Head atraumatic, normocephalic. Oropharynx and nasopharynx clear.  NECK:  Supple, no jugular venous distention. No thyroid enlargement, no tenderness.  LUNGS: Normal breath sounds bilaterally, no wheezing, rales,rhonchi or crepitation. No use of accessory muscles of respiration.  CARDIOVASCULAR: S1, S2 normal. No murmurs, rubs, or gallops.  ABDOMEN: Soft, Right lower quadrant minimally tender, no rebound tenderness, non-distended. Bowel sounds present. No organomegaly or mass.  EXTREMITIES: No pedal edema, cyanosis, or clubbing.  NEUROLOGIC: Cranial nerves II through XII are intact. Muscle strength 5/5 in all extremities. Sensation intact. Gait not checked.  PSYCHIATRIC: The patient is alert and oriented x 3.  SKIN: No obvious rash, lesion, or ulcer.   DATA REVIEW:   CBC  Recent Labs Lab 09/30/16 0308  WBC 7.6  HGB 10.9*  HCT 30.7*  PLT 146*    Chemistries   Recent Labs Lab 09/29/16 1201 09/30/16 0308  NA 136 140  K 3.8 4.0  CL 103 109  CO2 25 25  GLUCOSE 125* 110*  BUN 29* 25*  CREATININE 1.60* 1.02*  CALCIUM 9.1 8.7*  AST 18  --   ALT 16  --   ALKPHOS 82  --   BILITOT 1.3*  --     Cardiac Enzymes  Recent Labs Lab 09/30/16 0308  TROPONINI <0.03    Microbiology Results  Results for orders placed or performed during the hospital encounter of 11/25/15  Urine culture     Status: Abnormal   Collection Time: 11/25/15 11:25 AM  Result Value Ref Range Status   Specimen Description URINE, RANDOM  Final   Special Requests NONE  Final   Culture MULTIPLE SPECIES PRESENT, SUGGEST RECOLLECTION (A)  Final   Report Status 11/26/2015 FINAL  Final    RADIOLOGY:  Ct Renal Stone Study  Result Date: 09/29/2016 CLINICAL DATA:  Left lower quadrant abdominal pain. EXAM: CT ABDOMEN AND  PELVIS WITHOUT CONTRAST TECHNIQUE: Multidetector CT imaging of the abdomen and pelvis was performed following the standard protocol without IV contrast. COMPARISON:  CT scan of March 01, 2015. FINDINGS: Lower chest: Visualized lung bases appear clear. Large hiatal hernia is noted. Hepatobiliary: No focal liver abnormality is seen. No gallstones, gallbladder wall thickening, or biliary dilatation. Pancreas: Unremarkable. No pancreatic ductal dilatation or surrounding inflammatory changes. Spleen: Normal in size  without focal abnormality. Adrenals/Urinary Tract: Adrenal glands are unremarkable. Kidneys are normal, without renal calculi, focal lesion, or hydronephrosis. Bladder is unremarkable. Stomach/Bowel: There is no evidence of bowel obstruction. Focal diverticulitis of the proximal sigmoid colon is noted. The appendix is not visualized, but no inflammation is noted in the right lower quadrant. Vascular/Lymphatic: Aortic atherosclerosis. No enlarged abdominal or pelvic lymph nodes. Reproductive: Status post hysterectomy. No adnexal masses. Other: No abdominal wall hernia or abnormality. No abdominopelvic ascites. Musculoskeletal: No acute or significant osseous findings. IMPRESSION: Large hiatal hernia. Proximal sigmoid diverticulitis without abscess formation. Electronically Signed   By: Marijo Conception, M.D.   On: 09/29/2016 13:54    EKG:   Orders placed or performed during the hospital encounter of 09/29/16  . ED EKG  . ED EKG  . EKG 12-Lead  . EKG 12-Lead      Management plans discussed with the patient, family and they are in agreement.  CODE STATUS:     Code Status Orders        Start     Ordered   09/29/16 1528  Do not attempt resuscitation (DNR)  Continuous    Question Answer Comment  In the event of cardiac or respiratory ARREST Do not call a "code blue"   In the event of cardiac or respiratory ARREST Do not perform Intubation, CPR, defibrillation or ACLS   In the event of  cardiac or respiratory ARREST Use medication by any route, position, wound care, and other measures to relive pain and suffering. May use oxygen, suction and manual treatment of airway obstruction as needed for comfort.      09/29/16 1527    Code Status History    Date Active Date Inactive Code Status Order ID Comments User Context   This patient has a current code status but no historical code status.    Advance Directive Documentation     Most Recent Value  Type of Advance Directive  Out of facility DNR (pink MOST or yellow form)  Pre-existing out of facility DNR order (yellow form or pink MOST form)  -  "MOST" Form in Place?  -      TOTAL TIME TAKING CARE OF THIS PATIENT: 45  minutes.   Note: This dictation was prepared with Dragon dictation along with smaller phrase technology. Any transcriptional errors that result from this process are unintentional.   @M[facility] @  on 09/30/2016 at 1:17 PM  Between 7am to 6pm - Pager - [phone]  After 6pm go to [email or web address] - password [email or web address] [facility]  [facility]  Office  [phone]  CC: Primary care physician; [name], MD

## 2016-09-30 NOTE — Plan of Care (Signed)
Problem: Safety: Goal: Ability to remain free from injury will improve Outcome: Completed/Met Date Met: 09/30/16 Pt remained injury free during her stay here.

## 2016-09-30 NOTE — Discharge Instructions (Signed)
Follow-up with primary care physician in a week ° °

## 2016-10-13 ENCOUNTER — Other Ambulatory Visit: Payer: Self-pay | Admitting: *Deleted

## 2016-10-13 DIAGNOSIS — C9110 Chronic lymphocytic leukemia of B-cell type not having achieved remission: Secondary | ICD-10-CM

## 2016-10-26 ENCOUNTER — Ambulatory Visit
Admission: RE | Admit: 2016-10-26 | Discharge: 2016-10-26 | Disposition: A | Discharge status: Home / Self Care | Payer: Medicare Other | Source: Ambulatory Visit | Attending: Nurse Practitioner | Admitting: Nurse Practitioner

## 2016-10-26 ENCOUNTER — Other Ambulatory Visit: Payer: Self-pay | Admitting: Nurse Practitioner

## 2016-10-26 DIAGNOSIS — K5792 Diverticulitis of intestine, part unspecified, without perforation or abscess without bleeding: Secondary | ICD-10-CM

## 2016-10-26 DIAGNOSIS — K449 Diaphragmatic hernia without obstruction or gangrene: Secondary | ICD-10-CM | POA: Insufficient documentation

## 2016-10-26 DIAGNOSIS — R609 Edema, unspecified: Secondary | ICD-10-CM | POA: Diagnosis not present

## 2016-10-26 DIAGNOSIS — I700 Atherosclerosis of aorta: Secondary | ICD-10-CM | POA: Insufficient documentation

## 2016-10-26 DIAGNOSIS — R1032 Left lower quadrant pain: Secondary | ICD-10-CM | POA: Diagnosis not present

## 2016-10-26 DIAGNOSIS — C9190 Lymphoid leukemia, unspecified not having achieved remission: Secondary | ICD-10-CM | POA: Insufficient documentation

## 2016-10-26 DIAGNOSIS — R590 Localized enlarged lymph nodes: Secondary | ICD-10-CM | POA: Diagnosis not present

## 2016-10-26 DIAGNOSIS — C9110 Chronic lymphocytic leukemia of B-cell type not having achieved remission: Secondary | ICD-10-CM

## 2016-10-26 DIAGNOSIS — K5732 Diverticulitis of large intestine without perforation or abscess without bleeding: Secondary | ICD-10-CM | POA: Insufficient documentation

## 2016-10-26 MED ORDER — IOPAMIDOL (ISOVUE-300) INJECTION 61%
75.0000 mL | Freq: Once | INTRAVENOUS | Status: AC | PRN
  Administered 2016-10-26: 75 mL via INTRAVENOUS

## 2016-11-14 ENCOUNTER — Other Ambulatory Visit: Payer: Medicare Other

## 2016-11-14 ENCOUNTER — Ambulatory Visit: Payer: Medicare Other | Admitting: Hematology and Oncology

## 2016-11-21 ENCOUNTER — Ambulatory Visit
Admission: RE | Admit: 2016-11-21 | Discharge: 2016-11-21 | Disposition: A | Discharge status: Home / Self Care | Payer: MEDICARE

## 2016-11-21 ENCOUNTER — Ambulatory Visit
Admission: RE | Admit: 2016-11-21 | Discharge: 2016-11-21 | Disposition: A | Discharge status: Home / Self Care | Payer: MEDICARE | Attending: Adult Health | Admitting: Adult Health

## 2016-11-21 DIAGNOSIS — C9110 Chronic lymphocytic leukemia of B-cell type not having achieved remission: Principal | ICD-10-CM

## 2017-01-17 ENCOUNTER — Other Ambulatory Visit: Payer: Self-pay | Admitting: Specialist

## 2017-01-17 DIAGNOSIS — J849 Interstitial pulmonary disease, unspecified: Secondary | ICD-10-CM

## 2017-01-17 DIAGNOSIS — R0602 Shortness of breath: Secondary | ICD-10-CM

## 2017-01-29 ENCOUNTER — Encounter: Admission: RE | Payer: Self-pay | Source: Ambulatory Visit

## 2017-01-29 ENCOUNTER — Ambulatory Visit
Admission: RE | Admit: 2017-01-29 | Payer: Medicare Other | Source: Ambulatory Visit | Admitting: Unknown Physician Specialty

## 2017-01-29 SURGERY — COLONOSCOPY WITH PROPOFOL
Anesthesia: General

## 2017-02-20 ENCOUNTER — Ambulatory Visit
Admission: RE | Admit: 2017-02-20 | Discharge: 2017-02-20 | Disposition: A | Discharge status: Home / Self Care | Payer: MEDICARE | Attending: Hematology & Oncology | Admitting: Hematology & Oncology

## 2017-02-20 ENCOUNTER — Ambulatory Visit
Admission: RE | Admit: 2017-02-20 | Discharge: 2017-02-20 | Disposition: A | Discharge status: Home / Self Care | Payer: MEDICARE

## 2017-02-20 DIAGNOSIS — Z23 Encounter for immunization: Secondary | ICD-10-CM

## 2017-02-20 DIAGNOSIS — C9110 Chronic lymphocytic leukemia of B-cell type not having achieved remission: Principal | ICD-10-CM

## 2017-02-20 DIAGNOSIS — N6452 Nipple discharge: Secondary | ICD-10-CM

## 2017-03-14 ENCOUNTER — Other Ambulatory Visit: Payer: Self-pay | Admitting: Internal Medicine

## 2017-03-14 DIAGNOSIS — Z1231 Encounter for screening mammogram for malignant neoplasm of breast: Secondary | ICD-10-CM

## 2017-03-15 ENCOUNTER — Other Ambulatory Visit: Payer: Self-pay | Admitting: Neurology

## 2017-03-15 DIAGNOSIS — R519 Headache, unspecified: Secondary | ICD-10-CM

## 2017-03-15 DIAGNOSIS — R51 Headache: Principal | ICD-10-CM

## 2017-03-15 DIAGNOSIS — C9110 Chronic lymphocytic leukemia of B-cell type not having achieved remission: Secondary | ICD-10-CM

## 2017-03-19 ENCOUNTER — Ambulatory Visit: Payer: Medicare Other | Admitting: Certified Registered Nurse Anesthetist

## 2017-03-19 ENCOUNTER — Encounter: Payer: Self-pay | Admitting: *Deleted

## 2017-03-19 ENCOUNTER — Encounter
Admission: RE | Disposition: A | Discharge status: Home / Self Care | Payer: Self-pay | Source: Ambulatory Visit | Attending: Unknown Physician Specialty

## 2017-03-19 ENCOUNTER — Ambulatory Visit
Admission: RE | Admit: 2017-03-19 | Discharge: 2017-03-19 | Disposition: A | Discharge status: Home / Self Care | Payer: Medicare Other | Source: Ambulatory Visit | Attending: Unknown Physician Specialty | Admitting: Unknown Physician Specialty

## 2017-03-19 DIAGNOSIS — K5730 Diverticulosis of large intestine without perforation or abscess without bleeding: Secondary | ICD-10-CM | POA: Diagnosis not present

## 2017-03-19 DIAGNOSIS — Z803 Family history of malignant neoplasm of breast: Secondary | ICD-10-CM | POA: Diagnosis not present

## 2017-03-19 DIAGNOSIS — K219 Gastro-esophageal reflux disease without esophagitis: Secondary | ICD-10-CM | POA: Diagnosis present

## 2017-03-19 DIAGNOSIS — J45909 Unspecified asthma, uncomplicated: Secondary | ICD-10-CM | POA: Insufficient documentation

## 2017-03-19 DIAGNOSIS — Z7982 Long term (current) use of aspirin: Secondary | ICD-10-CM | POA: Insufficient documentation

## 2017-03-19 DIAGNOSIS — Z79899 Other long term (current) drug therapy: Secondary | ICD-10-CM | POA: Insufficient documentation

## 2017-03-19 DIAGNOSIS — Z86010 Personal history of colonic polyps: Secondary | ICD-10-CM | POA: Diagnosis not present

## 2017-03-19 DIAGNOSIS — E785 Hyperlipidemia, unspecified: Secondary | ICD-10-CM | POA: Insufficient documentation

## 2017-03-19 DIAGNOSIS — K2950 Unspecified chronic gastritis without bleeding: Secondary | ICD-10-CM | POA: Diagnosis not present

## 2017-03-19 DIAGNOSIS — K640 First degree hemorrhoids: Secondary | ICD-10-CM | POA: Diagnosis not present

## 2017-03-19 DIAGNOSIS — B9681 Helicobacter pylori [H. pylori] as the cause of diseases classified elsewhere: Secondary | ICD-10-CM | POA: Insufficient documentation

## 2017-03-19 DIAGNOSIS — Z1211 Encounter for screening for malignant neoplasm of colon: Secondary | ICD-10-CM | POA: Insufficient documentation

## 2017-03-19 DIAGNOSIS — Z8249 Family history of ischemic heart disease and other diseases of the circulatory system: Secondary | ICD-10-CM | POA: Diagnosis not present

## 2017-03-19 DIAGNOSIS — F329 Major depressive disorder, single episode, unspecified: Secondary | ICD-10-CM | POA: Diagnosis not present

## 2017-03-19 DIAGNOSIS — K449 Diaphragmatic hernia without obstruction or gangrene: Secondary | ICD-10-CM | POA: Diagnosis not present

## 2017-03-19 DIAGNOSIS — F418 Other specified anxiety disorders: Secondary | ICD-10-CM | POA: Insufficient documentation

## 2017-03-19 HISTORY — PX: ESOPHAGOGASTRODUODENOSCOPY (EGD) WITH PROPOFOL: SHX5813

## 2017-03-19 HISTORY — PX: COLONOSCOPY WITH PROPOFOL: SHX5780

## 2017-03-19 SURGERY — COLONOSCOPY WITH PROPOFOL
Anesthesia: General

## 2017-03-19 MED ORDER — SODIUM CHLORIDE 0.9 % IV SOLN: INTRAVENOUS | Status: DC

## 2017-03-19 MED ORDER — SODIUM CHLORIDE 0.9 % IV SOLN
INTRAVENOUS | Status: DC
  Administered 2017-03-19: 09:00:00 via INTRAVENOUS

## 2017-03-19 MED ORDER — GLYCOPYRROLATE 0.2 MG/ML IJ SOLN
INTRAMUSCULAR | Status: DC | PRN
  Administered 2017-03-19: 0.2 mg via INTRAVENOUS

## 2017-03-19 MED ORDER — PROPOFOL 500 MG/50ML IV EMUL
INTRAVENOUS | Status: DC | PRN
  Administered 2017-03-19: 140 ug/kg/min via INTRAVENOUS

## 2017-03-19 MED ORDER — LIDOCAINE HCL (CARDIAC) 20 MG/ML IV SOLN
INTRAVENOUS | Status: DC | PRN
  Administered 2017-03-19: 100 mg via INTRAVENOUS

## 2017-03-19 MED ORDER — PROPOFOL 500 MG/50ML IV EMUL
INTRAVENOUS | Status: AC
  Filled 2017-03-19: qty 50

## 2017-03-19 MED ORDER — PROPOFOL 10 MG/ML IV BOLUS
INTRAVENOUS | Status: DC | PRN
  Administered 2017-03-19: 60 mg via INTRAVENOUS

## 2017-03-19 NOTE — Op Note (Signed)
[facility] Gastroenterology Patient Name: [name] Procedure Date: 03/19/2017 8:51 AM MRN: [id number] Account #: [id number] Date of Birth: 10/05/1940 Admit Type: Outpatient Age: 76 Room: [facility] ENDO ROOM 3 Gender: Female Note Status: Finalized Procedure:            Colonoscopy Indications:          High risk colon cancer surveillance: Personal history                        of colonic polyps Providers:            Manya Silvas, MD Referring MD:         Ocie Cornfield. Ouida Sills MD, MD (Referring MD) Medicines:            Propofol per Anesthesia Complications:        No immediate complications. Procedure:            Pre-Anesthesia Assessment:                       - After reviewing the risks and benefits, the patient                        was deemed in satisfactory condition to undergo the                        procedure.                       After obtaining informed consent, the colonoscope was                        passed under direct vision. Throughout the procedure,                        the patient's blood pressure, pulse, and oxygen                        saturations were monitored continuously. The                        Colonoscope was introduced through the anus and                        advanced to the the cecum, identified by appendiceal                        orifice and ileocecal valve. The colonoscopy was                        performed without difficulty. The patient tolerated the                        procedure well. The quality of the bowel preparation                        was excellent. Findings:      A few small-mouthed diverticula were found in the sigmoid colon.      Internal hemorrhoids were found during endoscopy. The hemorrhoids were       small, medium-sized and Grade I (internal hemorrhoids that do not       prolapse).  The exam was otherwise without abnormality. Impression:           - Diverticulosis in the sigmoid  colon.                       - Internal hemorrhoids.                       - The examination was otherwise normal.                       - No specimens collected. Recommendation:       - The findings and recommendations were discussed with                        the patient. And her family. Manya Silvas, MD 03/19/2017 9:30:20 AM This report has been signed electronically. Number of Addenda: 0 Note Initiated On: 03/19/2017 8:51 AM Scope Withdrawal Time: 0 hours 7 minutes 48 seconds  Total Procedure Duration: 0 hours 18 minutes 11 seconds       [facility]

## 2017-03-19 NOTE — Transfer of Care (Signed)
Immediate Anesthesia Transfer of Care Note  Patient: [name]  Procedure(s) Performed: COLONOSCOPY WITH PROPOFOL (N/A ) ESOPHAGOGASTRODUODENOSCOPY (EGD) WITH PROPOFOL (N/A )  Patient Location: PACU  Anesthesia Type:General  Level of Consciousness: sedated  Airway & Oxygen Therapy: Patient Spontanous Breathing and Patient connected to face mask oxygen  Post-op Assessment: Report given to RN and Post -op Vital signs reviewed and stable  Post vital signs: Reviewed and stable  Last Vitals:  Vitals:   03/19/17 0835  BP: (!) 153/70  Pulse: 81  Resp: 16  Temp: (!) 36.1 C  SpO2: 97%    Last Pain:  Vitals:   03/19/17 0835  TempSrc: Tympanic         Complications: No apparent anesthesia complications

## 2017-03-19 NOTE — Anesthesia Procedure Notes (Signed)
Performed by: Li Bobo, CRNA Pre-anesthesia Checklist: Patient identified, Suction available, Emergency Drugs available, Patient being monitored and Timeout performed Patient Re-evaluated:Patient Re-evaluated prior to induction Oxygen Delivery Method: Nasal cannula Induction Type: IV induction       

## 2017-03-19 NOTE — Anesthesia Preprocedure Evaluation (Signed)
Anesthesia Evaluation  Patient identified by MRN, date of birth, ID band Patient awake    Reviewed: Allergy & Precautions, NPO status , Patient's Chart, lab work & pertinent test results  Airway Mallampati: II       Dental  (+) Teeth Intact   Pulmonary asthma ,    breath sounds clear to auscultation       Cardiovascular Exercise Tolerance: Good  Rhythm:Regular Rate:Normal     Neuro/Psych  Headaches, Anxiety Depression    GI/Hepatic hiatal hernia, GERD  Medicated,  Endo/Other  negative endocrine ROS  Renal/GU      Musculoskeletal negative musculoskeletal ROS (+)   Abdominal Normal abdominal exam  (+)   Peds negative pediatric ROS (+)  Hematology negative hematology ROS (+)   Anesthesia Other Findings   Reproductive/Obstetrics                             Anesthesia Physical Anesthesia Plan  ASA: II  Anesthesia Plan: General   Post-op Pain Management:    Induction: Intravenous  PONV Risk Score and Plan: 0  Airway Management Planned: Natural Airway and Nasal Cannula  Additional Equipment:   Intra-op Plan:   Post-operative Plan:   Informed Consent: I have reviewed the patients History and Physical, chart, labs and discussed the procedure including the risks, benefits and alternatives for the proposed anesthesia with the patient or authorized representative who has indicated his/her understanding and acceptance.     Plan Discussed with: CRNA  Anesthesia Plan Comments:         Anesthesia Quick Evaluation

## 2017-03-19 NOTE — H&P (Signed)
Primary Care Physician:  [name], MD Primary Gastroenterologist:  Dr. Vira Agar  Pre-Procedure History & Physical: HPI:  [name] is a 76 y.o. female is here for an endoscopy and colonoscopy.   Past Medical History:  Diagnosis Date  . Acute diarrhea 04/08/2014  . Anxiety   . Asthma   . 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 Cancer ([place])   . Chronic migraine without aura 03/13/2014   Last Assessment & Plan:  Headaches stable generally   . Depression   . Fibrocystic disease of breast   . GERD (gastroesophageal reflux disease)   . Hiatal hernia   . Hyperlipidemia   . Osteoporosis   . Peptic ulcer disease   . Psoriasis   . Renal stone   . Syncope and collapse 10/29/2009   Qualifier: Diagnosis of  By: Rockey Situ MD, Tim      Past Surgical History:  Procedure Laterality Date  . APPENDECTOMY    . BREAST EXCISIONAL BIOPSY Right 2005   neg  . BREAST EXCISIONAL BIOPSY Right 1990   neg  . HERNIA REPAIR    . PARTIAL HYSTERECTOMY    . VESICOVAGINAL FISTULA CLOSURE W/ TAH      Prior to Admission medications   Medication Sig Start Date End Date Taking? Authorizing Provider  aspirin EC 81 MG tablet Take 81 mg daily by mouth.   Yes [provider]  baclofen (LIORESAL) 10 MG tablet Take 10 mg 3 (three) times daily by mouth.   Yes [provider]  ferrous sulfate 325 (65 FE) MG tablet Take 325 mg by mouth at bedtime.   Yes [provider]  Fluticasone-Salmeterol (ADVAIR) 100-50 MCG/DOSE AEPB Inhale 1 puff into the lungs as needed.   Yes [provider]  HYDROcodone-acetaminophen (NORCO/VICODIN) 5-325 MG tablet Take 1-2 tablets by mouth every 4 (four) hours as needed for moderate pain. 09/30/16  Yes Gouru, Aruna, MD  LORazepam (ATIVAN) 0.5 MG tablet Take 0.5 mg by mouth at bedtime.    Yes [provider]  metoprolol tartrate (LOPRESSOR) 25 MG tablet Take 0.5 tablets (12.5 mg total) by mouth 2 (two) times daily. 09/30/16  Yes Gouru,  Illene Silver, MD  Multiple Vitamins-Minerals (CENTRUM WOMEN) TABS Take 1 tablet by mouth at bedtime.   Yes [provider]  pantoprazole (PROTONIX) 40 MG tablet Take 40 mg by mouth 2 (two) times daily.  05/28/15  Yes [provider]  pravastatin (PRAVACHOL) 80 MG tablet Take 40 mg by mouth at bedtime.  07/26/15  Yes [provider]  sertraline (ZOLOFT) 50 MG tablet Take 50 mg by mouth daily.  06/01/15  Yes [provider]  acetaminophen (TYLENOL) 325 MG tablet Take 2 tablets (650 mg total) by mouth every 6 (six) hours as needed for mild pain (or Fever >/= 101). 09/30/16   Gouru, Aruna, MD  azelastine (ASTELIN) 0.1 % nasal spray Place 1 spray into both nostrils daily as needed.  01/31/16 01/30/17  [provider]  senna-docusate (SENOKOT-S) 8.6-50 MG tablet Take 1 tablet by mouth at bedtime as needed for mild constipation. 09/30/16   Nicholes Mango, MD    Allergies as of 03/15/2017  . (No Known Allergies)    Family History  Problem Relation Age of Onset  . Hematuria Father   . Congestive Heart Failure Father   . Dementia Mother   . Congestive Heart Failure Mother   . Neuropathy Brother   . Coronary artery disease Other   . Breast cancer  Maternal Aunt   . Breast cancer Paternal Aunt   . Breast cancer Maternal Aunt     Social History   Socioeconomic History  . Marital status: Widowed    Spouse name: Not on file  . Number of children: 1  . Years of education: some coll.  . Highest education level: Not on file  Social Needs  . Financial resource strain: Not on file  . Food insecurity - worry: Not on file  . Food insecurity - inability: Not on file  . Transportation needs - medical: Not on file  . Transportation needs - non-medical: Not on file  Occupational History  . Occupation: retired  Tobacco Use  . Smoking status: Never Smoker  . Smokeless tobacco: Never Used  Substance and Sexual Activity  . Alcohol use: No  . Drug use: No  . Sexual  activity: Not on file  Other Topics Concern  . Not on file  Social History Narrative   Married   Patient occasionally drinks caffeine.   Patient is right handed.     Review of Systems: See HPI, otherwise negative ROS  Physical Exam: BP (!) 153/70   Pulse 81   Temp (!) 96.9 F (36.1 C) (Tympanic)   Resp 16   Ht 5\' 7"  (1.702 m)   Wt 79.4 kg (175 lb)   SpO2 97%   BMI 27.41 kg/m  General:   Alert,  pleasant and cooperative in NAD Head:  Normocephalic and atraumatic. Neck:  Supple; no masses or thyromegaly. Lungs:  Clear throughout to auscultation.    Heart:  Regular rate and rhythm. Abdomen:  Soft, nontender and nondistended. Normal bowel sounds, without guarding, and without rebound.   Neurologic:  Alert and  oriented x4;  grossly normal neurologically.  Impression/Plan: [name] is here for an endoscopy and colonoscopy to be performed for Chest pain, GERD, PH colon polyps.  Risks, benefits, limitations, and alternatives regarding  endoscopy and colonoscopy have been reviewed with the patient.  Questions have been answered.  All parties agreeable.   Gaylyn Cheers, MD  03/19/2017, 8:50 AM

## 2017-03-19 NOTE — Anesthesia Postprocedure Evaluation (Signed)
Anesthesia Post Note  Patient: [name]  Procedure(s) Performed: COLONOSCOPY WITH PROPOFOL (N/A ) ESOPHAGOGASTRODUODENOSCOPY (EGD) WITH PROPOFOL (N/A )  Patient location during evaluation: PACU Anesthesia Type: General Level of consciousness: awake Pain management: pain level controlled Vital Signs Assessment: post-procedure vital signs reviewed and stable Respiratory status: spontaneous breathing Cardiovascular status: stable Anesthetic complications: no     Last Vitals:  Vitals:   03/19/17 0942 03/19/17 0952  BP: 111/65 (!) 141/78  Pulse: 73 67  Resp: (!) 24 17  Temp:    SpO2: 98% 98%    Last Pain:  Vitals:   03/19/17 0932  TempSrc: Tympanic                 VAN STAVEREN,[name]

## 2017-03-19 NOTE — Anesthesia Post-op Follow-up Note (Signed)
Anesthesia QCDR form completed.        

## 2017-03-19 NOTE — Op Note (Signed)
[facility] Gastroenterology Patient Name: [name] Procedure Date: 03/19/2017 8:51 AM MRN: [id number] Account #: [id number] Date of Birth: [birth year] Admit Type: Outpatient Age: 76 Room: [facility] ENDO ROOM 3 Gender: Female Note Status: Finalized Procedure:            Upper GI endoscopy Indications:          Heartburn, Follow-up of gastro-esophageal reflux disease Providers:            Manya Silvas, MD Referring MD:         Ocie Cornfield. Ouida Sills MD, MD (Referring MD) Medicines:            Propofol per Anesthesia Complications:        No immediate complications. Procedure:            Pre-Anesthesia Assessment:                       - After reviewing the risks and benefits, the patient                        was deemed in satisfactory condition to undergo the                        procedure.                       After obtaining informed consent, the endoscope was                        passed under direct vision. Throughout the procedure,                        the patient's blood pressure, pulse, and oxygen                        saturations were monitored continuously. The Endoscope                        was introduced through the mouth, and advanced to the                        second part of duodenum. The upper GI endoscopy was                        accomplished without difficulty. The patient tolerated                        the procedure well. Findings:      The examined esophagus was normal. GEJ 30cm. 8 cm hiatal hernia from       stomach.      Diffuse mild inflammation characterized by erythema and granularity was       found in the gastric body and in the gastric antrum. Biopsies were taken       with a cold forceps for histology. Biopsies were taken with a cold       forceps for Helicobacter pylori testing.      The examined duodenum was normal. Impression:           - Normal esophagus.                       - Gastritis.  Biopsied.              - Normal examined duodenum. Recommendation:       - Await pathology results. Do colonoscopy. Manya Silvas, MD 03/19/2017 9:07:34 AM This report has been signed electronically. Number of Addenda: 0 Note Initiated On: 03/19/2017 8:51 AM      [facility]

## 2017-03-20 ENCOUNTER — Encounter: Payer: Self-pay | Admitting: Unknown Physician Specialty

## 2017-03-20 LAB — SURGICAL PATHOLOGY

## 2017-03-28 ENCOUNTER — Ambulatory Visit
Admission: RE | Admit: 2017-03-28 | Discharge: 2017-03-28 | Disposition: A | Discharge status: Home / Self Care | Payer: Medicare Other | Source: Ambulatory Visit | Attending: Neurology | Admitting: Neurology

## 2017-03-28 ENCOUNTER — Encounter (INDEPENDENT_AMBULATORY_CARE_PROVIDER_SITE_OTHER): Payer: Self-pay

## 2017-03-28 DIAGNOSIS — C9190 Lymphoid leukemia, unspecified not having achieved remission: Secondary | ICD-10-CM | POA: Insufficient documentation

## 2017-03-28 DIAGNOSIS — G8929 Other chronic pain: Secondary | ICD-10-CM

## 2017-03-28 DIAGNOSIS — R51 Headache: Secondary | ICD-10-CM | POA: Diagnosis present

## 2017-03-28 DIAGNOSIS — I6782 Cerebral ischemia: Secondary | ICD-10-CM | POA: Insufficient documentation

## 2017-03-28 DIAGNOSIS — C9110 Chronic lymphocytic leukemia of B-cell type not having achieved remission: Secondary | ICD-10-CM

## 2017-03-28 MED ORDER — GADOBENATE DIMEGLUMINE 529 MG/ML IV SOLN
20.0000 mL | Freq: Once | INTRAVENOUS | Status: AC | PRN
  Administered 2017-03-28: 16 mL via INTRAVENOUS

## 2017-04-02 ENCOUNTER — Encounter: Payer: Self-pay | Admitting: Podiatry

## 2017-04-02 ENCOUNTER — Ambulatory Visit: Payer: Medicare Other

## 2017-04-02 ENCOUNTER — Other Ambulatory Visit: Payer: Self-pay | Admitting: Podiatry

## 2017-04-02 ENCOUNTER — Ambulatory Visit: Payer: Medicare Other | Admitting: Podiatry

## 2017-04-02 ENCOUNTER — Ambulatory Visit (INDEPENDENT_AMBULATORY_CARE_PROVIDER_SITE_OTHER): Payer: Medicare Other

## 2017-04-02 DIAGNOSIS — M79671 Pain in right foot: Secondary | ICD-10-CM

## 2017-04-02 DIAGNOSIS — M2040 Other hammer toe(s) (acquired), unspecified foot: Secondary | ICD-10-CM

## 2017-04-02 DIAGNOSIS — M79672 Pain in left foot: Secondary | ICD-10-CM

## 2017-04-02 DIAGNOSIS — M779 Enthesopathy, unspecified: Secondary | ICD-10-CM | POA: Diagnosis not present

## 2017-04-02 DIAGNOSIS — M2041 Other hammer toe(s) (acquired), right foot: Secondary | ICD-10-CM | POA: Diagnosis not present

## 2017-04-02 MED ORDER — TRIAMCINOLONE ACETONIDE 10 MG/ML IJ SUSP
10.0000 mg | Freq: Once | INTRAMUSCULAR | Status: AC
  Administered 2017-04-02: 10 mg

## 2017-04-02 NOTE — Progress Notes (Signed)
Subjective:   Patient ID: [name], female   DOB: 76 y.o.   MRN: [id number]   HPI Patient states that her right foot has been hurting a lot and she's not sure what she may have done to it but states it's been getting worse as far as discomfort goes   ROS      Objective:  Physical Exam  Neurovascular status intact muscle strength adequate with elevation of the second digit right with moderate rigid contracture and inflammation and fluid around the second MPJ with pain upon palpation     Assessment:  2 separate problems with one being inflammatory capsulitis and second being digital deformity     Plan:  H&P condition reviewed and at this point for hammertoe I did dispense padding and discussed wider shoes and possibility for digital fusion in future. For the joint I did go ahead did proximal nerve block aspirated the joint getting out a small amount of clear fluid and injected quarter cc deck Smith some Kenalog and applied thick plantar pad to reduce pressure on the joint surface. Reappoint to recheck  X-rays indicate there is no signs stress fracture with moderate elevation of the digit and medial dislocation of the toe

## 2017-04-03 ENCOUNTER — Other Ambulatory Visit: Payer: Self-pay | Admitting: Internal Medicine

## 2017-04-03 DIAGNOSIS — N6452 Nipple discharge: Secondary | ICD-10-CM

## 2017-04-03 DIAGNOSIS — N644 Mastodynia: Secondary | ICD-10-CM

## 2017-04-11 ENCOUNTER — Other Ambulatory Visit: Payer: Medicare Other

## 2017-04-12 ENCOUNTER — Other Ambulatory Visit: Payer: Medicare Other

## 2017-04-26 ENCOUNTER — Ambulatory Visit
Admission: RE | Admit: 2017-04-26 | Discharge: 2017-04-26 | Disposition: A | Discharge status: Home / Self Care | Payer: Medicare Other | Source: Ambulatory Visit | Attending: Internal Medicine | Admitting: Internal Medicine

## 2017-04-26 DIAGNOSIS — N644 Mastodynia: Secondary | ICD-10-CM

## 2017-04-26 DIAGNOSIS — N6452 Nipple discharge: Secondary | ICD-10-CM

## 2017-04-26 DIAGNOSIS — R928 Other abnormal and inconclusive findings on diagnostic imaging of breast: Secondary | ICD-10-CM | POA: Diagnosis not present

## 2017-06-05 ENCOUNTER — Ambulatory Visit
Admit: 2017-06-05 | Discharge: 2017-06-05 | Payer: MEDICARE | Attending: Hematology & Oncology | Primary: Hematology & Oncology

## 2017-06-05 ENCOUNTER — Other Ambulatory Visit: Admit: 2017-06-05 | Discharge: 2017-06-05 | Payer: MEDICARE

## 2017-06-05 DIAGNOSIS — C9110 Chronic lymphocytic leukemia of B-cell type not having achieved remission: Principal | ICD-10-CM

## 2017-07-23 NOTE — Progress Notes (Signed)
07/24/2017 11:23 AM   [name] 1941/04/25 [id number]  Referring provider: [name], MD [facility], [place], [place] [address]  Chief Complaint  Patient presents with  . Urinary Incontinence    HPI: Patient is a 77 -year-old Caucasian female who is referred to [place] by Denice Paradise, NP for urinary incontinence.  Patient states that she has had urinary incontinence for a long time.    Patient has incontinence with urge and sneezing, coughing,  etc .   She is experiencing 2 to 3 incontinent episodes during the day. She is experiencing two incontinent episodes during the night.  Her incontinence volume is large.   She is wearing 5 pads/depends daily.    She is not having associated urinary frequency x every hour, strong urgency and nocturia x 2-3.  Patient denies any gross hematuria, dysuria or suprapubic/flank pain.  Patient denies any fevers, chills, nausea or vomiting.  Her PVR is 0 mL.    She does not have a history of urinary tract infections, STI's or injury to the bladder.   She had a bladder tacking in the 1980"s.    She is not sexually active.   She is post menopausal.     She admits to diarrhea.   She is drinking a lot of water daily.   No coffee.  Occasional soda, teas and juices.  She does not drink any alcohol.     She does have a history of microscopic hematuria and underwent a hematuria work up in 2017.  No worrisome findings.        PMH: Past Medical History:  Diagnosis Date  . Acute diarrhea 04/08/2014  . Anxiety   . Asthma   . 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 Cancer ([place])   . Chronic migraine without aura 03/13/2014   Last Assessment & Plan:  Headaches stable generally   . Depression   . Fibrocystic disease of breast   . GERD (gastroesophageal reflux disease)   . Hiatal hernia   . Hyperlipidemia   . Osteoporosis   . Peptic ulcer disease   . Psoriasis   . Renal stone   . Syncope and  collapse 10/29/2009   Qualifier: Diagnosis of  By: Rockey Situ MD, Tim      Surgical History: Past Surgical History:  Procedure Laterality Date  . APPENDECTOMY    . BREAST EXCISIONAL BIOPSY Right 2005   neg  . BREAST EXCISIONAL BIOPSY Right 1990   neg  . COLONOSCOPY WITH PROPOFOL N/A 03/19/2017   Procedure: COLONOSCOPY WITH PROPOFOL;  Surgeon: Manya Silvas, MD;  Location: [facility] ENDOSCOPY;  Service: Endoscopy;  Laterality: N/A;  . ESOPHAGOGASTRODUODENOSCOPY (EGD) WITH PROPOFOL N/A 03/19/2017   Procedure: ESOPHAGOGASTRODUODENOSCOPY (EGD) WITH PROPOFOL;  Surgeon: Manya Silvas, MD;  Location: [facility] ENDOSCOPY;  Service: Endoscopy;  Laterality: N/A;  . EXCISION OF BREAST BIOPSY Left 2017   pos  . HERNIA REPAIR    . PARTIAL HYSTERECTOMY    . VESICOVAGINAL FISTULA CLOSURE W/ TAH      Home Medications:  Allergies as of 07/24/2017   No Known Allergies     Medication List        Accurate as of 07/24/17 11:59 PM. Always use your most recent med list.          acetaminophen 325 MG tablet Commonly known as:  TYLENOL Take 2 tablets (650 mg total) by mouth every 6 (six) hours as needed for mild  pain (or Fever >/= 101).   aspirin EC 81 MG tablet Take 81 mg daily by mouth.   azelastine 0.1 % nasal spray Commonly known as:  ASTELIN Place 1 spray into both nostrils daily as needed.   baclofen 10 MG tablet Commonly known as:  LIORESAL Take 10 mg 3 (three) times daily by mouth.   CENTRUM WOMEN Tabs Take 1 tablet by mouth at bedtime.   ferrous sulfate 325 (65 FE) MG tablet Take 325 mg by mouth at bedtime.   fesoterodine 4 MG Tb24 tablet Commonly known as:  TOVIAZ Take 1 tablet (4 mg total) by mouth daily.   Fluticasone-Salmeterol 100-50 MCG/DOSE Aepb Commonly known as:  ADVAIR Inhale 1 puff into the lungs as needed.   HYDROcodone-acetaminophen 5-325 MG tablet Commonly known as:  NORCO/VICODIN Take 1-2 tablets by mouth every 4 (four) hours as needed for moderate pain.     LORazepam 0.5 MG tablet Commonly known as:  ATIVAN Take 0.5 mg by mouth at bedtime.   metoprolol tartrate 25 MG tablet Commonly known as:  LOPRESSOR Take 0.5 tablets (12.5 mg total) by mouth 2 (two) times daily.   MULTI-VITAMINS Tabs Take by mouth.   pantoprazole 40 MG tablet Commonly known as:  PROTONIX Take 40 mg by mouth 2 (two) times daily.   pravastatin 80 MG tablet Commonly known as:  PRAVACHOL Take 40 mg by mouth at bedtime.   senna-docusate 8.6-50 MG tablet Commonly known as:  Senokot-S Take 1 tablet by mouth at bedtime as needed for mild constipation.   sertraline 50 MG tablet Commonly known as:  ZOLOFT Take 50 mg by mouth daily.   topiramate 50 MG tablet Commonly known as:  TOPAMAX Take 50 mg by mouth daily.       Allergies: No Known Allergies  Family History: Family History  Problem Relation Age of Onset  . Hematuria Father   . Congestive Heart Failure Father   . Dementia Mother   . Congestive Heart Failure Mother   . Neuropathy Brother   . Coronary artery disease Other   . Breast cancer Maternal Aunt   . Breast cancer Paternal Aunt   . Breast cancer Maternal Aunt     Social History:  reports that she has never smoked. She has never used smokeless tobacco. She reports that she does not drink alcohol or use drugs.  ROS: UROLOGY Frequent Urination?: Yes Hard to postpone urination?: Yes Burning/pain with urination?: No Get up at night to urinate?: Yes Leakage of urine?: Yes Urine stream starts and stops?: No Trouble starting stream?: No Do you have to strain to urinate?: No Blood in urine?: No Urinary tract infection?: No Sexually transmitted disease?: No Injury to kidneys or bladder?: No Painful intercourse?: No Weak stream?: No Currently pregnant?: No Vaginal bleeding?: No Last menstrual period?: n  Gastrointestinal Nausea?: No Vomiting?: No Indigestion/heartburn?: No Diarrhea?: No Constipation?: No  Constitutional Fever:  No Night sweats?: Yes Weight loss?: No Fatigue?: No  Skin Skin rash/lesions?: No Itching?: No  Eyes Blurred vision?: Yes Double vision?: No  Ears/Nose/Throat Sore throat?: No Sinus problems?: Yes  Hematologic/Lymphatic Swollen glands?: No Easy bruising?: No  Cardiovascular Leg swelling?: No Chest pain?: No  Respiratory Cough?: No Shortness of breath?: Yes  Endocrine Excessive thirst?: No  Musculoskeletal Back pain?: Yes Joint pain?: No  Neurological Headaches?: Yes Dizziness?: Yes  Psychologic Depression?: No Anxiety?: No  Physical Exam: BP (!) 160/78   Pulse 70   Ht 5\' 7"  (1.702 m)   Wt  181 lb (82.1 kg)   BMI 28.35 kg/m   Constitutional: Well nourished. Alert and oriented, No acute distress. HEENT: [place] AT, moist mucus membranes. Trachea midline, no masses. Cardiovascular: No clubbing, cyanosis, or edema. Respiratory: Normal respiratory effort, no increased work of breathing. GI: Abdomen is soft, non tender, non distended, no abdominal masses. Liver and spleen not palpable.  No hernias appreciated.  Stool sample for occult testing is not indicated.   GU: No CVA tenderness.  No bladder fullness or masses.  Atrophic external genitalia, normal pubic hair distribution, no lesions.  Normal urethral meatus, no lesions, no prolapse, no discharge.   No urethral masses, tenderness and/or tenderness. No bladder fullness, tenderness or masses. Normal vagina mucosa, good estrogen effect, no discharge, no lesions, good pelvic support, Grade I cystocele. Rectocele noted.  Cervix and uterus are surgically absent.  No adnexal/parametria masses or tenderness noted.  Anus and perineum are without rashes or lesions.    Skin: No rashes, bruises or suspicious lesions. Lymph: No cervical or inguinal adenopathy. Neurologic: Grossly intact, no focal deficits, moving all 4 extremities. Psychiatric: Normal mood and affect.  Laboratory Data: Lab Results  Component Value Date    WBC 7.6 09/30/2016   HGB 10.9 (L) 09/30/2016   HCT 30.7 (L) 09/30/2016   MCV 92.3 09/30/2016   PLT 146 (L) 09/30/2016    Lab Results  Component Value Date   CREATININE 1.02 (H) 09/30/2016    No results found for: PSA  No results found for: TESTOSTERONE  No results found for: HGBA1C  No results found for: TSH     Component Value Date/Time   CHOL 179 09/29/2016 1201   CHOL 170 09/29/2016 1201   HDL 42 09/29/2016 1201   HDL 43 09/29/2016 1201   CHOLHDL 4.3 09/29/2016 1201   CHOLHDL 4.0 09/29/2016 1201   VLDL 29 09/29/2016 1201   VLDL 27 09/29/2016 1201   LDLCALC 108 (H) 09/29/2016 1201   LDLCALC 100 (H) 09/29/2016 1201    Lab Results  Component Value Date   AST 18 09/29/2016   Lab Results  Component Value Date   ALT 16 09/29/2016   No components found for: ALKALINEPHOPHATASE No components found for: BILIRUBINTOTAL  No results found for: ESTRADIOL  Urinalysis    Component Value Date/Time   COLORURINE YELLOW (A) 11/25/2015 1125   APPEARANCEUR CLEAR (A) 11/25/2015 1125   APPEARANCEUR Clear 10/01/2015 0909   LABSPEC 1.014 11/25/2015 1125   PHURINE 6.0 11/25/2015 1125   GLUCOSEU NEGATIVE 11/25/2015 1125   HGBUR NEGATIVE 11/25/2015 1125   BILIRUBINUR NEGATIVE 11/25/2015 1125   BILIRUBINUR Negative 10/01/2015 0909   KETONESUR NEGATIVE 11/25/2015 1125   PROTEINUR NEGATIVE 11/25/2015 1125   NITRITE NEGATIVE 11/25/2015 1125   LEUKOCYTESUR 2+ (A) 11/25/2015 1125   LEUKOCYTESUR 1+ (A) 10/01/2015 0909    I have reviewed the labs.   Pertinent Imaging: Results for LIBNI, FUSARO (MRN [id number]) as of 08/13/2017 11:26  Ref. Range 08/13/2017 11:25  Scan Result Unknown 0 mL    Assessment & Plan:    1. Mixed Incontinence  - Discussed behavioral therapies, bladder training, bladder control strategies and pelvic floor muscle training  - fluid management   - offered medical therapy with anticholinergic therapy as she is not a candidate for beta-3 adrenergic  receptor agonist due to her uncontrolled HTN  - would like to try anticholinergic therapy.  Given Toviaz 4mg  samples, # 28.   Advised of the side effects, such as: Dry eyes, dry mouth,  constipation, mental confusion and/or urinary retention.   - RTC in 3 weeks for PVR and symptom recheck   2. Cystocele May need to consider referral for pessary area if medications do not help her meet goal      Return in about 3 weeks (around 08/14/2017) for PVR, UA and OAB questionnaire.  These notes generated with voice recognition software. I apologize for typographical errors.  Zara Council, [facility] [address] [place], [place] [address] [phone]

## 2017-07-24 ENCOUNTER — Ambulatory Visit: Payer: Medicare HMO | Admitting: Urology

## 2017-07-24 ENCOUNTER — Encounter: Payer: Self-pay | Admitting: Urology

## 2017-07-24 VITALS — BP 160/78 | HR 70 | Ht 67.0 in | Wt 181.0 lb

## 2017-07-24 DIAGNOSIS — N3946 Mixed incontinence: Secondary | ICD-10-CM

## 2017-07-24 MED ORDER — FESOTERODINE FUMARATE ER 4 MG PO TB24: 4.0000 mg | ORAL_TABLET | Freq: Every day | ORAL | 0 refills | Status: DC

## 2017-07-24 NOTE — Patient Instructions (Addendum)
Fesoterodine extended-release tablets What is this medicine? FESOTERODINE (fes oh TER oh deen) is used to treat overactive bladder. This medicine reduces the amount of bathroom visits. It may also help to control wetting accidents. This medicine may be used for other purposes; ask your health care provider or pharmacist if you have questions. COMMON BRAND NAME(S): Lisbeth Ply What should I tell my health care provider before I take this medicine? They need to know if you have any of these conditions: -difficulty passing urine -glaucoma -kidney disease -liver disease -myasthenia gravis -prostate disease -stomach or intestine problems -an unusual or allergic reaction to fesoterodine, tolterodine, other medicines, foods, dyes, or preservatives -pregnant or trying to get pregnant -breast-feeding How should I use this medicine? Take this medicine by mouth with a glass of water. Follow the directions on the prescription label. Do not cut, crush or chew this medicine. Take your doses at regular intervals. Do not take your medicine more often than directed. Talk to your pediatrician regarding the use of this medicine in children. Special care may be needed. Overdosage: If you think you have taken too much of this medicine contact a poison control center or emergency room at once. NOTE: This medicine is only for you. Do not share this medicine with others. What if I miss a dose? If you miss a dose, skip the missed dose and begin taking this medicine again the next day. If it is almost time for your next dose, take only that dose. Do not take double or extra doses. What may interact with this medicine? -antihistamines for allergy, cough and cold -atropine -certain medicines for bladder problems like oxybutynin, tolterodine -certain medicines for Parkinson's disease like benztropine, trihexyphenidyl -certain medicines for stomach problems like dicyclomine, hyoscyamine -certain medicines for travel  sickness like scopolamine -clarithromycin -ipratropium -itraconazole -ketoconazole -rifampin This list may not describe all possible interactions. Give your health care provider a list of all the medicines, herbs, non-prescription drugs, or dietary supplements you use. Also tell them if you smoke, drink alcohol, or use illegal drugs. Some items may interact with your medicine. What should I watch for while using this medicine? It may take a few weeks to notice the full benefit from this medicine. You may need to limit your intake tea, coffee, caffeinated sodas, and alcohol. These drinks may make your symptoms worse. You may get drowsy or dizzy. Do not drive, use machinery, or do anything that needs mental alertness until you know how this drug affects you. Do not stand or sit up quickly, especially if you are an older patient. This reduces the risk of dizzy or fainting spells. Alcohol may interfere with the effect of this medicine. Avoid alcoholic drinks. Your mouth may get dry. Chewing sugarless gum or sucking hard candy and drinking plenty of water will help. This medicine may cause dry eyes and blurred vision. If you wear contact lenses you may feel some discomfort. Lubricating drops may help. See your eye doctor if the problem does not go away or is severe. Avoid extreme heat. This medicine can cause you to sweat less than normal. Your body temperature could increase to dangerous levels, which may lead to heat stroke. What side effects may I notice from receiving this medicine? Side effects that you should report to your doctor or health care professional as soon as possible: -allergic reactions like skin rash, itching or hives, swelling of the face, lips, or tongue -breathing problems -fast, irregular heartbeat -trouble passing urine or change in the amount  of urine Side effects that usually do not require medical attention (report to your doctor or health care professional if they continue  or are bothersome): -changes in vision -constipation -dizziness -drowsiness -dry eyes or mouth -upset stomach This list may not describe all possible side effects. Call your doctor for medical advice about side effects. You may report side effects to FDA at 1-800-FDA-1088. Where should I keep my medicine? Keep out of the reach of children. Store at room temperature between 15 and 30 degrees C (59 and 86 degrees F). Protect from moisture. Throw away any unused medicine after the expiration date. NOTE: This sheet is a summary. It may not cover all possible information. If you have questions about this medicine, talk to your doctor, pharmacist, or health care provider.  2018 Elsevier/Gold Standard (2016-02-04 11:19:40)   Starting today, please save all your incontinence pads over a 24 hour period and then weigh them.  Record the weight.  Then 1 to 2 days prior to your return appointment, save all your incontinence pads over a 24 hour period and weigh them.  Record that weight.  Bring both numbers to your appointment.

## 2017-08-09 ENCOUNTER — Ambulatory Visit: Payer: Medicare HMO | Admitting: Podiatry

## 2017-08-09 ENCOUNTER — Ambulatory Visit (INDEPENDENT_AMBULATORY_CARE_PROVIDER_SITE_OTHER): Payer: Medicare HMO

## 2017-08-09 ENCOUNTER — Encounter: Payer: Self-pay | Admitting: Podiatry

## 2017-08-09 DIAGNOSIS — M779 Enthesopathy, unspecified: Secondary | ICD-10-CM | POA: Diagnosis not present

## 2017-08-09 DIAGNOSIS — M2041 Other hammer toe(s) (acquired), right foot: Secondary | ICD-10-CM | POA: Diagnosis not present

## 2017-08-09 DIAGNOSIS — D169 Benign neoplasm of bone and articular cartilage, unspecified: Secondary | ICD-10-CM

## 2017-08-09 MED ORDER — TRIAMCINOLONE ACETONIDE 10 MG/ML IJ SUSP
10.0000 mg | Freq: Once | INTRAMUSCULAR | Status: AC
  Administered 2017-08-09: 10 mg

## 2017-08-10 NOTE — Progress Notes (Signed)
Subjective:   Patient ID: [name], female   DOB: 77 y.o.   MRN: [id number]   HPI Patient presents stating that she is got digital deformities and she is developed inflammation in her right foot that is been painful.  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  Reappoint to recheck  X-rays indicate digital deformities with rotational component to the fifth digit right foot

## 2017-08-13 LAB — BLADDER SCAN AMB NON-IMAGING: Scan Result: 0

## 2017-08-15 ENCOUNTER — Ambulatory Visit: Payer: Medicare HMO | Admitting: Urology

## 2017-08-16 ENCOUNTER — Other Ambulatory Visit: Payer: Self-pay | Admitting: Nurse Practitioner

## 2017-08-16 DIAGNOSIS — M542 Cervicalgia: Secondary | ICD-10-CM

## 2017-08-23 ENCOUNTER — Ambulatory Visit
Admission: RE | Admit: 2017-08-23 | Discharge: 2017-08-23 | Disposition: A | Discharge status: Home / Self Care | Payer: Medicare HMO | Source: Ambulatory Visit | Attending: Nurse Practitioner | Admitting: Nurse Practitioner

## 2017-08-23 ENCOUNTER — Emergency Department: Admit: 2017-08-23 | Discharge: 2017-08-24 | Disposition: A | Discharge status: Home / Self Care | Payer: MEDICARE

## 2017-08-23 ENCOUNTER — Ambulatory Visit: Admit: 2017-08-23 | Discharge: 2017-08-24 | Disposition: A | Discharge status: Home / Self Care | Payer: MEDICARE

## 2017-08-23 DIAGNOSIS — R042 Hemoptysis: Principal | ICD-10-CM

## 2017-08-23 DIAGNOSIS — M542 Cervicalgia: Secondary | ICD-10-CM

## 2017-08-23 DIAGNOSIS — M47812 Spondylosis without myelopathy or radiculopathy, cervical region: Secondary | ICD-10-CM | POA: Diagnosis not present

## 2017-08-24 DIAGNOSIS — R042 Hemoptysis: Principal | ICD-10-CM

## 2017-09-03 ENCOUNTER — Other Ambulatory Visit: Admit: 2017-09-03 | Discharge: 2017-09-04 | Payer: MEDICARE

## 2017-09-03 ENCOUNTER — Ambulatory Visit: Admit: 2017-09-03 | Discharge: 2017-09-04 | Payer: MEDICARE

## 2017-09-03 DIAGNOSIS — C9110 Chronic lymphocytic leukemia of B-cell type not having achieved remission: Principal | ICD-10-CM

## 2017-09-04 ENCOUNTER — Ambulatory Visit: Admit: 2017-09-04 | Discharge: 2017-09-05 | Payer: MEDICARE

## 2017-09-04 ENCOUNTER — Ambulatory Visit
Admit: 2017-09-04 | Discharge: 2017-09-05 | Payer: MEDICARE | Attending: Hematology & Oncology | Primary: Hematology & Oncology

## 2017-09-04 ENCOUNTER — Other Ambulatory Visit: Admit: 2017-09-04 | Discharge: 2017-09-05 | Payer: MEDICARE

## 2017-09-04 DIAGNOSIS — Z1159 Encounter for screening for other viral diseases: Secondary | ICD-10-CM

## 2017-09-04 DIAGNOSIS — Z7289 Other problems related to lifestyle: Secondary | ICD-10-CM

## 2017-09-04 DIAGNOSIS — D591 Other autoimmune hemolytic anemias: Secondary | ICD-10-CM

## 2017-09-04 DIAGNOSIS — C9110 Chronic lymphocytic leukemia of B-cell type not having achieved remission: Principal | ICD-10-CM

## 2017-09-06 ENCOUNTER — Ambulatory Visit: Payer: Medicare HMO | Admitting: Podiatry

## 2017-09-10 ENCOUNTER — Ambulatory Visit: Admit: 2017-09-10 | Discharge: 2017-09-11 | Payer: MEDICARE

## 2017-09-10 ENCOUNTER — Other Ambulatory Visit: Admit: 2017-09-10 | Discharge: 2017-09-11 | Payer: MEDICARE

## 2017-09-10 DIAGNOSIS — C9110 Chronic lymphocytic leukemia of B-cell type not having achieved remission: Principal | ICD-10-CM

## 2017-09-13 ENCOUNTER — Ambulatory Visit: Payer: Medicare HMO | Admitting: Urology

## 2017-09-14 ENCOUNTER — Ambulatory Visit: Admit: 2017-09-14 | Discharge: 2017-09-15 | Payer: MEDICARE

## 2017-09-14 ENCOUNTER — Other Ambulatory Visit: Admit: 2017-09-14 | Discharge: 2017-09-15 | Payer: MEDICARE

## 2017-09-14 DIAGNOSIS — C9110 Chronic lymphocytic leukemia of B-cell type not having achieved remission: Principal | ICD-10-CM

## 2017-09-14 DIAGNOSIS — D591 Other autoimmune hemolytic anemias: Secondary | ICD-10-CM

## 2017-09-17 ENCOUNTER — Ambulatory Visit: Admit: 2017-09-17 | Discharge: 2017-09-18 | Payer: MEDICARE

## 2017-09-17 ENCOUNTER — Other Ambulatory Visit: Admit: 2017-09-17 | Discharge: 2017-09-18 | Payer: MEDICARE

## 2017-09-17 DIAGNOSIS — C9110 Chronic lymphocytic leukemia of B-cell type not having achieved remission: Principal | ICD-10-CM

## 2017-09-17 DIAGNOSIS — D591 Other autoimmune hemolytic anemias: Principal | ICD-10-CM

## 2017-09-21 ENCOUNTER — Ambulatory Visit: Admit: 2017-09-21 | Discharge: 2017-09-22 | Payer: MEDICARE

## 2017-09-21 DIAGNOSIS — C9110 Chronic lymphocytic leukemia of B-cell type not having achieved remission: Principal | ICD-10-CM

## 2017-09-21 DIAGNOSIS — D591 Other autoimmune hemolytic anemias: Principal | ICD-10-CM

## 2017-09-24 ENCOUNTER — Ambulatory Visit: Admit: 2017-09-24 | Discharge: 2017-09-25 | Payer: MEDICARE

## 2017-09-24 DIAGNOSIS — C9110 Chronic lymphocytic leukemia of B-cell type not having achieved remission: Principal | ICD-10-CM

## 2017-09-28 ENCOUNTER — Ambulatory Visit: Admit: 2017-09-28 | Discharge: 2017-09-29 | Payer: MEDICARE | Attending: Adult Health | Primary: Adult Health

## 2017-09-28 ENCOUNTER — Ambulatory Visit: Admit: 2017-09-28 | Discharge: 2017-09-29 | Payer: MEDICARE

## 2017-09-28 ENCOUNTER — Other Ambulatory Visit: Admit: 2017-09-28 | Discharge: 2017-09-29 | Payer: MEDICARE

## 2017-09-28 DIAGNOSIS — D693 Immune thrombocytopenic purpura: Secondary | ICD-10-CM

## 2017-09-28 DIAGNOSIS — R17 Unspecified jaundice: Secondary | ICD-10-CM

## 2017-09-28 DIAGNOSIS — Z23 Encounter for immunization: Secondary | ICD-10-CM

## 2017-09-28 DIAGNOSIS — C9110 Chronic lymphocytic leukemia of B-cell type not having achieved remission: Secondary | ICD-10-CM

## 2017-09-28 DIAGNOSIS — D591 Other autoimmune hemolytic anemias: Principal | ICD-10-CM

## 2017-10-05 ENCOUNTER — Ambulatory Visit: Admit: 2017-10-05 | Discharge: 2017-10-05 | Payer: MEDICARE

## 2017-10-05 ENCOUNTER — Other Ambulatory Visit: Admit: 2017-10-05 | Discharge: 2017-10-05 | Payer: MEDICARE

## 2017-10-05 DIAGNOSIS — C9110 Chronic lymphocytic leukemia of B-cell type not having achieved remission: Secondary | ICD-10-CM

## 2017-10-05 DIAGNOSIS — D591 Other autoimmune hemolytic anemias: Principal | ICD-10-CM

## 2017-10-07 ENCOUNTER — Ambulatory Visit: Admit: 2017-10-07 | Discharge: 2017-10-08 | Payer: MEDICARE

## 2017-10-07 DIAGNOSIS — C9110 Chronic lymphocytic leukemia of B-cell type not having achieved remission: Principal | ICD-10-CM

## 2017-10-09 ENCOUNTER — Ambulatory Visit: Admit: 2017-10-09 | Discharge: 2017-10-10 | Payer: MEDICARE

## 2017-10-09 ENCOUNTER — Other Ambulatory Visit: Admit: 2017-10-09 | Discharge: 2017-10-10 | Payer: MEDICARE

## 2017-10-09 DIAGNOSIS — C9110 Chronic lymphocytic leukemia of B-cell type not having achieved remission: Principal | ICD-10-CM

## 2017-10-12 ENCOUNTER — Ambulatory Visit: Admit: 2017-10-12 | Discharge: 2017-10-13 | Payer: MEDICARE

## 2017-10-12 ENCOUNTER — Other Ambulatory Visit: Admit: 2017-10-12 | Discharge: 2017-10-13 | Payer: MEDICARE

## 2017-10-12 DIAGNOSIS — C9190 Lymphoid leukemia, unspecified not having achieved remission: Principal | ICD-10-CM

## 2017-10-12 DIAGNOSIS — C9110 Chronic lymphocytic leukemia of B-cell type not having achieved remission: Principal | ICD-10-CM

## 2017-10-16 ENCOUNTER — Other Ambulatory Visit: Admit: 2017-10-16 | Discharge: 2017-10-16 | Payer: MEDICARE

## 2017-10-16 ENCOUNTER — Ambulatory Visit
Admit: 2017-10-16 | Discharge: 2017-10-16 | Payer: MEDICARE | Attending: Hematology & Oncology | Primary: Hematology & Oncology

## 2017-10-16 ENCOUNTER — Ambulatory Visit: Admit: 2017-10-16 | Discharge: 2017-10-16 | Payer: MEDICARE

## 2017-10-16 ENCOUNTER — Ambulatory Visit: Admit: 2017-10-16 | Discharge: 2017-10-16 | Payer: MEDICARE | Attending: Oncology | Primary: Oncology

## 2017-10-16 DIAGNOSIS — C9110 Chronic lymphocytic leukemia of B-cell type not having achieved remission: Principal | ICD-10-CM

## 2017-10-16 LAB — CBC W/ AUTO DIFF
BASOPHILS ABSOLUTE COUNT: 0.1 10*9/L (ref 0.0–0.1)
BASOPHILS RELATIVE PERCENT: 0.8 %
EOSINOPHILS ABSOLUTE COUNT: 0.3 10*9/L (ref 0.0–0.4)
EOSINOPHILS RELATIVE PERCENT: 2.1 %
HEMATOCRIT: 37.3 % (ref 36.0–46.0)
LARGE UNSTAINED CELLS: 7 % — ABNORMAL HIGH (ref 0–4)
LYMPHOCYTES ABSOLUTE COUNT: 7.9 10*9/L — ABNORMAL HIGH (ref 1.5–5.0)
LYMPHOCYTES RELATIVE PERCENT: 65.4 %
MEAN CORPUSCULAR HEMOGLOBIN: 31 pg (ref 26.0–34.0)
MEAN CORPUSCULAR VOLUME: 92.2 fL (ref 80.0–100.0)
MEAN PLATELET VOLUME: 11.5 fL — ABNORMAL HIGH (ref 7.0–10.0)
MONOCYTES ABSOLUTE COUNT: 0.3 10*9/L (ref 0.2–0.8)
MONOCYTES RELATIVE PERCENT: 2.4 %
NEUTROPHILS ABSOLUTE COUNT: 2.8 10*9/L (ref 2.0–7.5)
NEUTROPHILS RELATIVE PERCENT: 22.8 %
PLATELET COUNT: 11 10*9/L — ABNORMAL LOW (ref 150–440)
RED BLOOD CELL COUNT: 4.05 10*12/L (ref 4.00–5.20)
RED CELL DISTRIBUTION WIDTH: 16 % — ABNORMAL HIGH (ref 12.0–15.0)
WBC ADJUSTED: 12.1 10*9/L — ABNORMAL HIGH (ref 4.5–11.0)

## 2017-10-16 LAB — COMPREHENSIVE METABOLIC PANEL
ALKALINE PHOSPHATASE: 78 U/L (ref 38–126)
ALT (SGPT): 19 U/L (ref 15–48)
AST (SGOT): 22 U/L (ref 14–38)
BILIRUBIN TOTAL: 0.9 mg/dL (ref 0.0–1.2)
BLOOD UREA NITROGEN: 31 mg/dL — ABNORMAL HIGH (ref 7–21)
BUN / CREAT RATIO: 32
CALCIUM: 9.3 mg/dL (ref 8.5–10.2)
CHLORIDE: 108 mmol/L — ABNORMAL HIGH (ref 98–107)
CO2: 25 mmol/L (ref 22.0–30.0)
CREATININE: 0.98 mg/dL (ref 0.60–1.00)
EGFR MDRD AF AMER: >=60 mL/min/{1.73_m2} (ref >=60)
EGFR MDRD NON AF AMER: 55 mL/min/{1.73_m2} — ABNORMAL LOW (ref >=60)
GLUCOSE RANDOM: 108 mg/dL (ref 65–179)
POTASSIUM: 4 mmol/L (ref 3.5–5.0)
PROTEIN TOTAL: 6.8 g/dL (ref 6.5–8.3)
SODIUM: 141 mmol/L (ref 135–145)

## 2017-10-16 LAB — PLATELET COUNT
Lab: 15 — ABNORMAL LOW
Lab: 9 — CL

## 2017-10-16 LAB — AST (SGOT): Aspartate aminotransferase:CCnc:Pt:Ser/Plas:Qn:: 22

## 2017-10-16 LAB — EOSINOPHILS RELATIVE PERCENT: Lab: 2.1

## 2017-10-16 LAB — SMEAR REVIEW

## 2017-10-16 MED ORDER — VENETOCLAX 10 MG (14)-50 MG (7)-100 MG (21) TABLETS IN A DOSE PACK: each | 0 refills | 0 days

## 2017-10-16 MED ORDER — VENETOCLAX 10 MG (14)-50 MG (7)-100 MG (21) TABLETS IN A DOSE PACK
ORAL | 0 refills | 0.00000 days | Status: CP
Start: 2017-10-16 — End: 2018-01-31

## 2017-10-16 MED ORDER — ALLOPURINOL 300 MG TABLET
ORAL_TABLET | Freq: Every day | ORAL | 2 refills | 0.00000 days | Status: CP
Start: 2017-10-16 — End: 2018-01-22

## 2017-10-16 NOTE — Unmapped (Signed)
0830:  Labs drawn and sent for analysis.  Care provided by  Docia Furl, RN

## 2017-10-16 NOTE — Unmapped (Signed)
[facility] Follow Up       Patient Name: Katrina Gould  Patient Age: 77 y.o.  Encounter Date: 10/16/2017      Chief complaint/Reason for visit: CLL/SLL      Assessment:  Katrina Gould is a 77 y.o. female who presents for follow of her CLL/SLL, Rai stage 4. Disease markers: 46,XX,der(4)t(4;4)(p14;q21). IGHV unmutated (0%, 3-33*01). B71m 5.05. No TP53 mutation. High-risk by CLL-IPI (6 points).    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 She has not had any improvement in counts. I reviewed that we need to consider more definitive treatment for her CLL.    I reviewed options:  1) ibrutinib monotherapy  2) ven-obi (CLL14)  3) Alliance trial of IO vs IOV - this is not yet open and I do not think she can wait    She favors ven-obi as has significant concerns re: risk for afib with ibrutinib. Reviewed schedule, method of admin, possible AEs including TLS.    She will return in 1 week with plan to begin obi. Will get baseline CT scan in next week which will help assess TLS risk.    Local onc: Dr. Merlene Pulling    Plans and Recommendations:  1. CLL, Rai 4   - plt transfusion today  - transfuse for plt <20   - f/u with Dr. Lonni Fix 1 week  - begin allopurinol    2. Health Maintenance  - received Influenza vaccination this season  - continue annual dermatology follow up  - need to f/u at future visit whether has received second shingrix locally    4. Headaches  - Ongoing, chronic  - continue to follow with PCP and neurologist  - no changes to plan    Due to this patient's diagnosis, she is at significant risk for subsequent morbidity and/or mortality.      Interval History:  Patient Is doing fair today though is frustrated her plts remain low. She has had a bit of ecchymoses but no bleeding.    Otherwise, she denies new constitutional symptoms such as anorexia, weight loss, drenching night sweats or unexplained fevers.  Furthermore, she denies symptoms of marrow failure: unexplained bleeding or bruising, recurrent or unexplained intercurrent infections.  There have been no new or unexplained pains or self-identified masses, swelling or enlarged lymph nodes.    Past Medical, Surgical and Family History were reviewed and pertinent updates were made in the Electronic Medical Record      REVIEW OF SYSTEMS: 12 point review of systems negative unless indicated in interval history.      Oncology History:    Oncology History    CLL    First seen in oncology clinic locally 03/31/16    Had a left axillary biopsy 03/15/16 that confirmed CLL/SLL: Flow showed monoclonal B-cell population with CD5, CD23, kappa light chain. IHC showed positive CD20 (diffuse), negative for cyclin D1.    No constitutional symptoms except some sweats    Labs: Hb 12.0, Hct 34.8, plt 147. WBC was 10.6 with ANC 2.5. ALC was 7.5K.    CLL FISH studies: normal panel. No cells with 11q, tri 12, 17p or 13q, and the 11;14 translocation also not detected.    CT c/a/p 04/04/16 showed mild LAD in chest, abd, and pelvis and spleen was at ULN (13 x 5.6 x 10.9 cm). Largest node 1.6 cm.    Of note is that she had a prior  CT on 04/06/15 that showed similarly small enlarged node, largest being 1.9 cm.    CLL-IPI: As per Lancet Oncol Vol 15 October 2014  Includes (1) TP53 status (no abnormalities vs. Del 17p and/or TP53 mutation - 4 points) - no 17p (did not send TP53 mutation testing), (2) IGHV mutation status (mut vs unmut - 2 points) (pending) (3) serum B2-microglobulin (</=3.5 vs >3.5 - 2 points) (gets 2 points here) (4) clinical stage (Rai 0 vs Rai I-IV - 1 point) (gets 1 point here) and (5) age (</=65 vs >65 - 1 point)  (gets 1 point here)     Low risk (0-1 points)= 93.2% OS at 5 years  Int risk (2-3 points)= 79.3%  High risk (4-6 points)= 63.3%  - this patient has 4 points already, pending IGHV testing and prior to treatment would also need to send TP53 mutation testing  Very high risk (7-10 points)= 23.3%               CLL (chronic lymphocytic leukemia) (CMS-HCC)    05/16/2016 Initial Diagnosis     CLL (chronic lymphocytic leukemia) (RAF-HCC)         09/05/2017 -  Chemotherapy     Chemotherapy Treatment    Treatment Goal Control   Line of Treatment [No plan line of treatment]   Plan Name OP RITUXIMAB    Start Date 09/14/2017   End Date 10/05/2017   Provider Pernell Dupre, MD   Chemotherapy riTUXimab (RITUXAN) 712.5 mg in sodium chloride (NS) 0.9 % 500 mL IVPB, 375 mg/m2 = 712.5 mg, Intravenous, Once, 1 of 1 cycle  Administration: 712.5 mg (09/14/2017)  riTUXimab (RITUXAN) 712.5 mg in sodium chloride (NS) 0.9 % 250 mL rapid infusion, 375 mg/m2 = 712.5 mg, Intravenous, Once, 1 of 1 cycle  Administration: 712.5 mg (09/21/2017), 712.5 mg (09/28/2017), 712.5 mg (10/05/2017)               Social history:  Here with son  Lives alone  Husband died in 11/07/08      Allergies   Allergen Reactions   ??? Sertraline Other (See Comments)     Nervousness, panic, shortness of breath   ??? Topiramate Palpitations     Panic attacks; unable to sleep   ??? Baclofen      made me go crazy         Current Outpatient Medications   Medication Sig Dispense Refill   ??? LORazepam (ATIVAN) 0.5 MG tablet Take 0.5 mg by mouth two (2) times a day as needed.      ??? mirtazapine (REMERON) 7.5 MG tablet Take 7.5 mg by mouth nightly.     ??? multivitamin (TAB-A-VITE/THERAGRAN) per tablet Take 1 tablet by mouth daily.      ??? pantoprazole (PROTONIX) 40 MG tablet Take by mouth daily at 0600.      ??? pravastatin (PRAVACHOL) 80 MG tablet TAKE 1 TABLET BY MOUTH EVERY NIGHT AT BEDTIME       No current facility-administered medications for this visit.          Physical exam:  Vitals:    10/16/17 0901   BP: 139/66   Pulse: 79   Resp: 18   Temp: 36.3 ??C (97.4 ??F)   SpO2: 96%       General: in NAD, pleasant, comfortable appearing  HEENT:  PERRL. EOMI. No oropharyngeal ulceration or erythema.   Lymph node exam:  No lymphadenopathy in the occipital, auricular, anterior/posterior cervical, submental, supraclavicular, inguinal regions.  Stable, sub-centimeter axillary lymphadenopathy (b/l).  CV:  RRR, no murmurs, rubs, or gallops. Peripheral pulses 2+ b/l.   Resp:  CTAB, no wheezes or crackles. Comfortable on RA. No increased work of breathing.   Gastrointestinal: Soft, non-distended, non-tender to palpation. No hepatosplenomegaly. Normoactive bowel sounds.   Skin:  No rashes, petechiae or purpura.  No areas of skin breakdown.  Musculoskeletal:  FROM of extremities. No joint effusions or tenderness.   Psychiatric:  Alert and oriented to person, place, time and situation. Appropriate affect and mood.     Neurologic:  No focal deficits. CN II-XII intact. Normal gait.   Extremities:  No clubbing or cyanosis. No peripheral edema.     ECOG Performance Status: 1    Orders/Results:  Labs and pathology have been reviewed and pertinent results are as follows:    Results for orders placed or performed in visit on 10/16/17   Comprehensive Metabolic Panel   Result Value Ref Range    Sodium 141 135 - 145 mmol/L    Potassium 4.0 3.5 - 5.0 mmol/L    Chloride 108 (H) 98 - 107 mmol/L    CO2 25.0 22.0 - 30.0 mmol/L    BUN 31 (H) 7 - 21 mg/dL    Creatinine 9.62 9.52 - 1.00 mg/dL    BUN/Creatinine Ratio 32     EGFR MDRD Non Af Amer 55 (L) >=60 mL/min/1.21m2    EGFR MDRD Af Amer >=60 >=60 mL/min/1.75m2    Anion Gap 8 (L) 9 - 15 mmol/L    Glucose 108 65 - 179 mg/dL    Calcium 9.3 8.5 - 84.1 mg/dL    Albumin 4.0 3.5 - 5.0 g/dL    Total Protein 6.8 6.5 - 8.3 g/dL    Total Bilirubin 0.9 0.0 - 1.2 mg/dL    AST 22 14 - 38 U/L    ALT 19 15 - 48 U/L    Alkaline Phosphatase 78 38 - 126 U/L   CBC w/ Differential   Result Value Ref Range    WBC 12.1 (H) 4.5 - 11.0 10*9/L    RBC 4.05 4.00 - 5.20 10*12/L    HGB 12.5 12.0 - 16.0 g/dL    HCT 32.4 40.1 - 02.7 %    MCV 92.2 80.0 - 100.0 fL    MCH 31.0 26.0 - 34.0 pg    MCHC 33.6 31.0 - 37.0 g/dL    RDW 25.3 (H) 66.4 - 15.0 %    MPV 11.5 (H) 7.0 - 10.0 fL Platelet 11 (L) 150 - 440 10*9/L    Variable HGB Concentration Slight (A) Not Present    Neutrophils % 22.8 %    Lymphocytes % 65.4 %    Monocytes % 2.4 %    Eosinophils % 2.1 %    Basophils % 0.8 %    Absolute Neutrophils 2.8 2.0 - 7.5 10*9/L    Absolute Lymphocytes 7.9 (H) 1.5 - 5.0 10*9/L    Absolute Monocytes 0.3 0.2 - 0.8 10*9/L    Absolute Eosinophils 0.3 0.0 - 0.4 10*9/L    Absolute Basophils 0.1 0.0 - 0.1 10*9/L    Large Unstained Cells 7 (H) 0 - 4 %    Macrocytosis Slight (A) Not Present    Hypochromasia Slight (A) Not Present   Morphology Review   Result Value Ref Range    Smear Review Comments See Comment (A) Undefined

## 2017-10-16 NOTE — Unmapped (Signed)
Please return in 1 week for a follow up visit and labs.    Please call us if you experience: ??  1. Nausea or vomiting not controlled by nausea medicines  2. Fever of 100.5 F or higher, shaking chills, drenching night sweats.  3. Uncontrolled pain  4. Any rapidly enlarging lymph node or mass  5. Unintentional weight loss  6. Any other concerning symptom     MyChart Messages  For your safety and best care, please DO NOT use MyChart messages to report symptoms. (Symptoms should be reported by calling the nurse triage line). Please use MyChart for non-urgent matters such as general questions, non-urgent prescription refills, or non-urgent scheduling issues.     ?? Please do not use MyChart for URGENT messages, as messages are only checked during regular business hours.     ?? Please note that MyChart messages may be routed a central pool and one of your provider???s team members will get back to you.  - Expect up to 3 business days for response     If you have any other questions, please do not hesitate to contact us.    Nurse Navigator: Frankey Poot, RN ??    For health related questions Monday through Friday 8 AM??? 5 PM : please call the office at [phone] and ask for Frankey Poot, RN, our nurse navigator.   For appointment changes call: Main Clinic [phone].  Toll free number is [phone].    On Nights, Weekends and Holidays:  Call [phone] and ask for the adult hematologist/oncologist on call.      [facility]  [address]  [place], [place] [address]  [email or web address]    Results for orders placed or performed in visit on 10/16/17   Comprehensive Metabolic Panel   Result Value Ref Range    Sodium 141 135 - 145 mmol/L    Potassium 4.0 3.5 - 5.0 mmol/L    Chloride 108 (H) 98 - 107 mmol/L    CO2 25.0 22.0 - 30.0 mmol/L    BUN 31 (H) 7 - 21 mg/dL    Creatinine 2.44 0.10 - 1.00 mg/dL    BUN/Creatinine Ratio 32     EGFR MDRD Non Af Amer 55 (L) >=60 mL/min/1.66m2    EGFR MDRD Af Amer >=60 >=60 mL/min/1.75m2    Anion Gap 8 (L) 9 - 15 mmol/L    Glucose 108 65 - 179 mg/dL    Calcium 9.3 8.5 - 27.2 mg/dL    Albumin 4.0 3.5 - 5.0 g/dL    Total Protein 6.8 6.5 - 8.3 g/dL    Total Bilirubin 0.9 0.0 - 1.2 mg/dL    AST 22 14 - 38 U/L    ALT 19 15 - 48 U/L    Alkaline Phosphatase 78 38 - 126 U/L   CBC w/ Differential   Result Value Ref Range    WBC 12.1 (H) 4.5 - 11.0 10*9/L    RBC 4.05 4.00 - 5.20 10*12/L    HGB 12.5 12.0 - 16.0 g/dL    HCT 53.6 64.4 - 03.4 %    MCV 92.2 80.0 - 100.0 fL    MCH 31.0 26.0 - 34.0 pg    MCHC 33.6 31.0 - 37.0 g/dL    RDW 74.2 (H) 59.5 - 15.0 %    MPV 11.5 (H) 7.0 - 10.0 fL    Platelet 11 (L) 150 - 440 10*9/L    Variable HGB Concentration Slight (A) Not Present  Neutrophils % 22.8 %    Lymphocytes % 65.4 %    Monocytes % 2.4 %    Eosinophils % 2.1 %    Basophils % 0.8 %    Absolute Neutrophils 2.8 2.0 - 7.5 10*9/L    Absolute Lymphocytes 7.9 (H) 1.5 - 5.0 10*9/L    Absolute Monocytes 0.3 0.2 - 0.8 10*9/L    Absolute Eosinophils 0.3 0.0 - 0.4 10*9/L    Absolute Basophils 0.1 0.0 - 0.1 10*9/L    Large Unstained Cells 7 (H) 0 - 4 %    Macrocytosis Slight (A) Not Present    Hypochromasia Slight (A) Not Present

## 2017-10-16 NOTE — Unmapped (Addendum)
Venetoclax and Obinutuzumab    Cycle 1  ? Week 1  o Day 1: Obinutuzumab (IV) 100 mg   o Day 2: Obinutuzumab (IV) 900 mg  ? Week 2  o Day 8: Obinutuzumab (IV) 1000 mg  ? Week 3  o Day 15: Obinutuzumab (IV)  1000 mg  ? Week 4  o Day 22: Venetoclax 20 mg oral ??? once daily with food (Labs pre-dose, 6-8 hours after, and 24 hours after)  Cycle 2  ? Week 1  o Day 1: Obinutuzumab (IV) 1000 mg AND Venetoclax 50 mg oral ??? once daily with food (Labs pre-dose, 6-8 hours after, and 24 hours after)  ? Week 2  o Day 8: Venetoclax 100 mg oral ??? once daily with food  ? Week 3  o Day 15: Venetoclax 200 mg oral ??? once daily with food  ? Week 4  o Day 22: Venetoclax 400 mg oral ??? once daily with food      Cycle 3 --> Continue Venetoclax 400 mg once daily with food  ? Week 1  o Day 1: Obinutuzumab (IV) 1000 mg    Cycle 4 --> Continue Venetoclax 400 mg once daily with food  ? Week 1  o Day 1: Obinutuzumab (IV) 1000 mg    Cycle 5 --> Continue Venetoclax 400 mg once daily with food  ? Week 1  o Day 1: Obinutuzumab (IV) 1000 mg    Cycle 6 --> Continue Venetoclax 400 mg once daily with food  ? Week 1  o Day 1: Obinutuzumab (IV) 1000 mg    Cycles 7 through 12 --> Continue Venetoclax 400 mg once daily with food

## 2017-10-17 LAB — URIC ACID: Urate:MCnc:Pt:Ser/Plas:Qn:: 7.2 — ABNORMAL HIGH

## 2017-10-17 NOTE — Unmapped (Signed)
Hospital Outpatient Visit on 10/16/2017   Component Date Value Ref Range Status   ??? Reject/Recollect 10/16/2017 REJECT   Final    Duplicate order   ??? Unit Blood Type 10/16/2017 O Neg   Final   ??? ISBT Number 10/16/2017 9500   Final   ??? Unit # 10/16/2017 [id number]   Final   ??? Status 10/16/2017 Issued   Final   ??? Product ID 10/16/2017 Platelets   Final   ??? PRODUCT CODE 10/16/2017 [id number]   Final   ??? Platelet 10/16/2017 9* 150 - 440 10*9/L Final   ??? Unit Blood Type 10/16/2017 A Pos   Final   ??? ISBT Number 10/16/2017 6200   Final   ??? Unit # 10/16/2017 [id number]   Final   ??? Status 10/16/2017 Issued   Final   ??? Product ID 10/16/2017 Platelets   Final   ??? PRODUCT CODE 10/16/2017 [id number]   Final   ??? Platelet 10/16/2017 15* 150 - 440 10*9/L Final   Lab on 10/16/2017   Component Date Value Ref Range Status   ??? Sodium 10/16/2017 141  135 - 145 mmol/L Final   ??? Potassium 10/16/2017 4.0  3.5 - 5.0 mmol/L Final   ??? Chloride 10/16/2017 108* 98 - 107 mmol/L Final   ??? CO2 10/16/2017 25.0  22.0 - 30.0 mmol/L Final   ??? BUN 10/16/2017 31* 7 - 21 mg/dL Final   ??? Creatinine 10/16/2017 0.98  0.60 - 1.00 mg/dL Final   ??? BUN/Creatinine Ratio 10/16/2017 32   Final   ??? EGFR MDRD Non Af Amer 10/16/2017 55* >=60 mL/min/1.66m2 Final   ??? EGFR MDRD Af Amer 10/16/2017 >=60  >=60 mL/min/1.22m2 Final   ??? Anion Gap 10/16/2017 8* 9 - 15 mmol/L Final   ??? Glucose 10/16/2017 108  65 - 179 mg/dL Final   ??? Calcium 42/59/5638 9.3  8.5 - 10.2 mg/dL Final   ??? Albumin 75/64/3329 4.0  3.5 - 5.0 g/dL Final   ??? Total Protein 10/16/2017 6.8  6.5 - 8.3 g/dL Final   ??? Total Bilirubin 10/16/2017 0.9  0.0 - 1.2 mg/dL Final   ??? AST 51/88/4166 22  14 - 38 U/L Final   ??? ALT 10/16/2017 19  15 - 48 U/L Final   ??? Alkaline Phosphatase 10/16/2017 78  38 - 126 U/L Final   ??? WBC 10/16/2017 12.1* 4.5 - 11.0 10*9/L Final   ??? RBC 10/16/2017 4.05  4.00 - 5.20 10*12/L Final   ??? HGB 10/16/2017 12.5  12.0 - 16.0 g/dL Final   ??? HCT 10/29/1599 37.3  36.0 - 46.0 % Final   ??? MCV 10/16/2017 92.2  80.0 - 100.0 fL Final   ??? MCH 10/16/2017 31.0  26.0 - 34.0 pg Final   ??? MCHC 10/16/2017 33.6  31.0 - 37.0 g/dL Final   ??? RDW 09/32/3557 16.0* 12.0 - 15.0 % Final   ??? MPV 10/16/2017 11.5* 7.0 - 10.0 fL Final   ??? Platelet 10/16/2017 11* 150 - 440 10*9/L Final    Questionable result confirmed. Specimen integrity/identity confirmed.    ??? Variable HGB Concentration 10/16/2017 Slight* Not Present Final   ??? Neutrophils % 10/16/2017 22.8  % Final   ??? Lymphocytes % 10/16/2017 65.4  % Final   ??? Monocytes % 10/16/2017 2.4  % Final   ??? Eosinophils % 10/16/2017 2.1  % Final   ??? Basophils % 10/16/2017 0.8  % Final   ??? Absolute Neutrophils 10/16/2017 2.8  2.0 - 7.5 10*9/L  Final   ??? Absolute Lymphocytes 10/16/2017 7.9* 1.5 - 5.0 10*9/L Final   ??? Absolute Monocytes 10/16/2017 0.3  0.2 - 0.8 10*9/L Final   ??? Absolute Eosinophils 10/16/2017 0.3  0.0 - 0.4 10*9/L Final   ??? Absolute Basophils 10/16/2017 0.1  0.0 - 0.1 10*9/L Final   ??? Large Unstained Cells 10/16/2017 7* 0 - 4 % Final   ??? Macrocytosis 10/16/2017 Slight* Not Present Final   ??? Hypochromasia 10/16/2017 Slight* Not Present Final   ??? Smear Review Comments 10/16/2017 See Comment* Undefined Final    Slide reviewed         If you feel like this is an emergency please call 911.  For appointments or questions Monday through Friday 8AM-5PM please call [phone] or Toll Free [phone]. For Medical questions or concerns ask for the Nurse Triage Line.  On Nights, Weekends, and Holidays call [phone] and ask for the Oncologist on Call.  Reasons to call the Nurse Triage Line:  Fever of 100.5 or greater  Nausea and/or vomiting not relived with nausea medicine  Diarrhea or constipation  Severe pain not relieved with usual pain regimen  Shortness of breath  Uncontrolled bleeding  Mental status changes

## 2017-10-17 NOTE — Unmapped (Signed)
[facility] Specialty Medication Referral: PA APPROVED    Medication (Brand/Generic): VENCLEXTA    Initial FSI Test Claim completed with resulted information below:  No PA required  Patient ABLE to fill at [facility]  Insurance Company:  MEDICARE PART D  Anticipated Copay: $0  Is anticipated copay with a copay card or grant? YES    As Co-pay is under $100 defined limit, per policy there will be no further investigation of need for financial assistance at this time unless patient requests. This referral has been communicated to the provider and handed off to the [facility] team for further processing and filling of prescribed medication.   ______________________________________________________________________  Please utilize this referral for viewing purposes as it will serve as the central location for all relevant documentation and updates.

## 2017-10-18 ENCOUNTER — Other Ambulatory Visit: Admit: 2017-10-18 | Discharge: 2017-10-19 | Payer: MEDICARE

## 2017-10-18 ENCOUNTER — Ambulatory Visit: Admit: 2017-10-18 | Discharge: 2017-10-19 | Payer: MEDICARE

## 2017-10-18 DIAGNOSIS — C9110 Chronic lymphocytic leukemia of B-cell type not having achieved remission: Principal | ICD-10-CM

## 2017-10-18 LAB — CBC W/ AUTO DIFF
BASOPHILS ABSOLUTE COUNT: 0.1 10*9/L (ref 0.0–0.1)
BASOPHILS RELATIVE PERCENT: 0.8 %
EOSINOPHILS ABSOLUTE COUNT: 0.2 10*9/L (ref 0.0–0.4)
EOSINOPHILS RELATIVE PERCENT: 2.1 %
HEMATOCRIT: 35.5 % — ABNORMAL LOW (ref 36.0–46.0)
HEMOGLOBIN: 12.1 g/dL (ref 12.0–16.0)
LARGE UNSTAINED CELLS: 4 % (ref 0–4)
LYMPHOCYTES ABSOLUTE COUNT: 6.8 10*9/L — ABNORMAL HIGH (ref 1.5–5.0)
MEAN CORPUSCULAR HEMOGLOBIN CONC: 34.2 g/dL (ref 31.0–37.0)
MEAN CORPUSCULAR HEMOGLOBIN: 31 pg (ref 26.0–34.0)
MEAN CORPUSCULAR VOLUME: 90.8 fL (ref 80.0–100.0)
MEAN PLATELET VOLUME: 12.7 fL — ABNORMAL HIGH (ref 7.0–10.0)
MONOCYTES ABSOLUTE COUNT: 0.3 10*9/L (ref 0.2–0.8)
NEUTROPHILS ABSOLUTE COUNT: 3.2 10*9/L (ref 2.0–7.5)
PLATELET COUNT: 10 10*9/L — ABNORMAL LOW (ref 150–440)
RED BLOOD CELL COUNT: 3.91 10*12/L — ABNORMAL LOW (ref 4.00–5.20)
RED CELL DISTRIBUTION WIDTH: 15.9 % — ABNORMAL HIGH (ref 12.0–15.0)
WBC ADJUSTED: 11.1 10*9/L — ABNORMAL HIGH (ref 4.5–11.0)

## 2017-10-18 LAB — PLATELET COUNT
Lab: 12 — ABNORMAL LOW
Lab: 28 — ABNORMAL LOW

## 2017-10-18 LAB — PHOSPHORUS: Phosphate:MCnc:Pt:Ser/Plas:Qn:: 3.8

## 2017-10-18 LAB — URIC ACID
Urate:MCnc:Pt:Ser/Plas:Qn:: 6
Urate:MCnc:Pt:Ser/Plas:Qn:: 6

## 2017-10-18 LAB — MEAN CORPUSCULAR HEMOGLOBIN CONC: Lab: 34.2

## 2017-10-18 LAB — GLUCOSE-6-PHOSPHATE DEHYDROGENASE QUAL: Lab: 9.9

## 2017-10-18 NOTE — Unmapped (Signed)
0800:  Labs drawn and sent for analysis.  Care provided by  Eppie Gibson, RN

## 2017-10-18 NOTE — Unmapped (Signed)
x2 units of Plts  infused uneventfully via PIV & post Plts 29 & site remained CDI/intact. Needle removed, applied gauzes, co-band wrapped. AVS given & ensured return appointment inn placed. Pt d/c in ambulatorily

## 2017-10-18 NOTE — Unmapped (Signed)
If you feel like this is an emergency please call 911.  For appointments or questions Monday through Friday 8AM-5PM please call [phone] or Toll Free [phone]. For Medical questions or concerns ask for the Nurse Triage Line.  On Nights, Weekends, and Holidays call [phone] and ask for the Oncologist on Call.  Reasons to call the Nurse Triage Line:  Fever of 100.5 or greater  Nausea and/or vomiting not relived with nausea medicine  Diarrhea or constipation  Severe pain not relieved with usual pain regimen  Shortness of breath  Uncontrolled bleeding  Mental status changes

## 2017-10-19 MED ORDER — AZITHROMYCIN 250 MG TABLET
PACK | 0 refills | 0 days | Status: CP
Start: 2017-10-19 — End: 2017-11-06

## 2017-10-19 MED FILL — VENCLEXTA STARTING PACK/START PK/TBPK: VENCLEXTA STARTING PACK/START PK/TBPK | 28 days supply | Qty: 42 | Fill #0

## 2017-10-19 NOTE — Unmapped (Signed)
[facility]   Patient Onboarding/Medication Counseling    Katrina Gould is a 77 y.o. female with CLL/SLL who I am counseling today on initiation of therapy.    Medication: Venclexta Starter Pack    Verified patient's date of birth / HIPAA.      Education Provided: ??    Dose/Administration discussed: 5 week ramp up: Week 1: Take 20 mg by mouth once daily.  Week 2: Take 50 mg by mouth once daily. Week 3: Take 100 mg by mouth once daily.  Week 4: Take 200 mg by mouth daily. Week 5: Take 400 mg by mouth once daily.  This medication should be taken  with food at the same time daily.  Stressed the importance of taking medication as prescribed and to contact provider if that changes at any time.  Discussed missed dose instructions: If <8 hours, take immediately, if >8 hours, wait until next dose.    Storage requirements: this medicine should be stored at room temperature.     Side effects / precautions discussed: Discussed common side effects, including constipation, diarrhea, stomach pain or upset, throwing up or feeling less hungry, signs of a common cold, feeling tired or weak, headache, dizziness, trouble sleeping, mouth irritation or mouth sores, muscle or joint pain, bank pain. If patient experiences bleeding or throwing up that grounds, coughing up blood, blood in the urine, black, red, or tarry stools, bleeding from the gums, bruises, bleeding that does not stop, mood changes, confusion, muscle pain/weakness, abnormal heartbeat, seizures, upset stomach, fever, chills, very bad sore throat, ear or sinus pain, cough, increase in sputum and change in color of sputum, pain with passing urine, mouth sores that wont heal, very bad headache or dizziness, passing out, or change in eyesight,, feeling very tired or weak, pale skin, swelling in the arms or legs, shortness of breath, cloudy or pink-red urine, dark urine or yellow skin or eyes,  they need to call the doctor.  Patient will receive a drug information handout with shipment.    Handling precautions / disposal reviewed:  Patient was counseled on the hazards surrounding oral chemotherapy and will minimize contact/exposure to the medicine.    Drug Interactions: other medications reviewed and up to date in Epic.  No drug interactions identified, but counseled patients to avoid grapefruit and grapefruit juice.    Comorbidities/Allergies: reviewed and up to date in Epic.    Verified therapy is appropriate and should continue      Delivery Information    Medication Assistance provided: [facility] Assistance    Anticipated copay of $0 reviewed with patient. Verified delivery address in FSI and reviewed medication storage requirement.    Scheduled delivery date: 09/18/2017    Explained that we ship using UPS or courier and this shipment will not require a signature.      Explained the services we provide at [facility] and that each month we would call to set up refills.  Stressed importance of returning phone calls so that we could ensure they receive their medications in time each month.  Informed patient that we should be setting up refills 7-10 days prior to when they will run out of medication.  Informed patient that welcome packet will be sent.      Patient verbalized understanding of the above information as well as how to contact the pharmacy at [phone] option 4 with any questions/concerns.  The pharmacy is open Monday through Friday 8:30am-4:30pm.  A pharmacist is  available 24/7 via pager to answer any clinical questions they may have.        Patient Specific Needs      ? Patient has no physical, cognitive, or cultural barriers.    ? Patient prefers to have medications discussed with  Patient     ? Patient is able to read and understand education materials at a high school level or above.    ? Patient's primary language is  English           [name]  Anders Grant  [facility] Specialty Pharmacist

## 2017-10-19 NOTE — Unmapped (Signed)
[facility] Triage Note     Patient: Katrina Gould     Reason for call: Follow up    Time call returned: 1531     Phone Assessment:  Spoke with patient and informed her not to take the venetoclax until she sees Dr. Lonni Fix in clinic. I also informed her that Dr. Lonni Fix is going to send in a prescription for an antibiotic for her cough and secretions.      Triage Recommendations: I verified the patient would like antibiotic sent to [facility] in [place] on her preferred pharmacy list     Patient Response: Pt verbalizes understanding and is appreciative of phone call. She intends to bring the whole unopened box of venetoclax with her to her clinic visit.       Outstanding tasks: Notify Dr. Lonni Fix of preferred pharmacy

## 2017-10-19 NOTE — Unmapped (Signed)
Patient called to inform Dr Lonni Fix that she has received her medication.    Medication : venetoclax (VENCLEXTA STARTING PACK) 10 mg-50 mg- 100 mg      Thanks in advance,  Yolanda Manges.  [facility]  [phone]

## 2017-10-19 NOTE — Unmapped (Signed)
AOC Triage Note     Patient: Katrina Gould     Reason for call: Venetoclax    Time call returned: 1500     Phone Assessment: Spoke with patient who was calling to notify Dr. Lonni Fix that she received her venetoclax. She also states she has started to get constipated and has been coughing up green mucus. Denies fever, runny nose, and shortness of breath     Triage Recommendations: I will pass along the medication notification to Dr. Lonni Fix. I recommended that the patient try taking miralax up to twice a day as it is mild and not a stimulant laxative. I will also notify the team of her secretions.     Patient Response: Pt verbalizes understanding and is appreciative of phone call.       Outstanding tasks: Notify care team

## 2017-10-22 ENCOUNTER — Ambulatory Visit: Admit: 2017-10-22 | Discharge: 2017-10-23 | Payer: MEDICARE

## 2017-10-22 DIAGNOSIS — C9110 Chronic lymphocytic leukemia of B-cell type not having achieved remission: Principal | ICD-10-CM

## 2017-10-23 ENCOUNTER — Ambulatory Visit: Admit: 2017-10-23 | Discharge: 2017-10-24 | Payer: MEDICARE | Attending: Oncology | Primary: Oncology

## 2017-10-23 ENCOUNTER — Other Ambulatory Visit: Admit: 2017-10-23 | Discharge: 2017-10-24 | Payer: MEDICARE

## 2017-10-23 ENCOUNTER — Ambulatory Visit
Admit: 2017-10-23 | Discharge: 2017-10-24 | Payer: MEDICARE | Attending: Hematology & Oncology | Primary: Hematology & Oncology

## 2017-10-23 ENCOUNTER — Ambulatory Visit: Admit: 2017-10-23 | Discharge: 2017-10-24 | Payer: MEDICARE

## 2017-10-23 DIAGNOSIS — C9110 Chronic lymphocytic leukemia of B-cell type not having achieved remission: Principal | ICD-10-CM

## 2017-10-23 DIAGNOSIS — D591 Other autoimmune hemolytic anemias: Secondary | ICD-10-CM

## 2017-10-23 LAB — COMPREHENSIVE METABOLIC PANEL
ALBUMIN: 4.3 g/dL (ref 3.5–5.0)
ALKALINE PHOSPHATASE: 88 U/L (ref 38–126)
ALT (SGPT): 24 U/L (ref 15–48)
ANION GAP: 11 mmol/L (ref 9–15)
AST (SGOT): 26 U/L (ref 14–38)
BLOOD UREA NITROGEN: 19 mg/dL (ref 7–21)
BUN / CREAT RATIO: 22
CALCIUM: 9.3 mg/dL (ref 8.5–10.2)
CHLORIDE: 108 mmol/L — ABNORMAL HIGH (ref 98–107)
CREATININE: 0.86 mg/dL (ref 0.60–1.00)
EGFR MDRD AF AMER: >=60 mL/min/{1.73_m2} (ref >=60)
EGFR MDRD NON AF AMER: >=60 mL/min/{1.73_m2} (ref >=60)
GLUCOSE RANDOM: 131 mg/dL (ref 65–179)
POTASSIUM: 3.8 mmol/L (ref 3.5–5.0)
PROTEIN TOTAL: 7.5 g/dL (ref 6.5–8.3)
SODIUM: 142 mmol/L (ref 135–145)

## 2017-10-23 LAB — CBC W/ AUTO DIFF
BASOPHILS ABSOLUTE COUNT: 0.1 10*9/L (ref 0.0–0.1)
EOSINOPHILS ABSOLUTE COUNT: 0.3 10*9/L (ref 0.0–0.4)
EOSINOPHILS RELATIVE PERCENT: 2.2 %
HEMATOCRIT: 36.8 % (ref 36.0–46.0)
HEMOGLOBIN: 12.5 g/dL (ref 12.0–16.0)
LARGE UNSTAINED CELLS: 5 % — ABNORMAL HIGH (ref 0–4)
LYMPHOCYTES ABSOLUTE COUNT: 7.8 10*9/L — ABNORMAL HIGH (ref 1.5–5.0)
MEAN CORPUSCULAR HEMOGLOBIN CONC: 34 g/dL (ref 31.0–37.0)
MEAN CORPUSCULAR HEMOGLOBIN: 31.2 pg (ref 26.0–34.0)
MEAN CORPUSCULAR VOLUME: 91.7 fL (ref 80.0–100.0)
MEAN PLATELET VOLUME: 11.4 fL — ABNORMAL HIGH (ref 7.0–10.0)
MONOCYTES ABSOLUTE COUNT: 0.2 10*9/L (ref 0.2–0.8)
MONOCYTES RELATIVE PERCENT: 1.5 %
NEUTROPHILS ABSOLUTE COUNT: 2.6 10*9/L (ref 2.0–7.5)
NEUTROPHILS RELATIVE PERCENT: 22.8 %
PLATELET COUNT: 10 10*9/L — ABNORMAL LOW (ref 150–440)
RED BLOOD CELL COUNT: 4.01 10*12/L (ref 4.00–5.20)
RED CELL DISTRIBUTION WIDTH: 16.1 % — ABNORMAL HIGH (ref 12.0–15.0)
WBC ADJUSTED: 11.5 10*9/L — ABNORMAL HIGH (ref 4.5–11.0)

## 2017-10-23 LAB — PLATELET COUNT: Lab: 7 — CL

## 2017-10-23 LAB — SMEAR REVIEW

## 2017-10-23 LAB — MACROCYTES

## 2017-10-23 LAB — ALBUMIN: Albumin:MCnc:Pt:Ser/Plas:Qn:: 4.3

## 2017-10-23 MED ORDER — VENETOCLAX 100 MG TABLET: 400 mg | tablet | Freq: Every day | 6 refills | 0 days | Status: AC

## 2017-10-23 MED ORDER — VENETOCLAX 100 MG TABLET
ORAL_TABLET | Freq: Every day | ORAL | 6 refills | 0.00000 days | Status: CP
Start: 2017-10-23 — End: 2017-10-23
  Filled 2017-12-10: qty 120, 30d supply, fill #0

## 2017-10-23 NOTE — Unmapped (Signed)
Pt received to chair 53. Pt will be getting platelets today for plts of 10. MD paged for clarification order for plt for obinutuzumab C1D1 treatment today.   1249 platelets were started.  1330 platelets were completed.   1341 Obinutuzumab was started.   1427 post platelets resulted to 7. Pt will receive another bag of platelets after Obinutuzumab completed.   1741 obinutuzumab was completed. Pt tolerated well.   1756 2nd unit of platelets were started.  1844 Platelets completed. Pt is returning tomorrow, will have platelet count  rechecked.   PIV was flushed and left in place as patient is returning tomorrow. Pt was given a copy of AVS and was discharged from clinic.

## 2017-10-23 NOTE — Unmapped (Signed)
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 Chemotherapy education was provided to the patient by an oncology pharmacist.      Side effects discussed included but were not limited to:   infusion-related reactions, extravasation, complications associated with myelosuppresion (such as infection/fever, fatigue, and bleeding), rash or fatigue.    The patient handout from [facility] or the Hematology/Oncology Fellow on-call phone number for concerns after hours were given.The patient verbalized understanding of this information. Pharmacy was called and verified she picked up her allopurinol on 10/17/17. Patient seemed confused about whether she was taking it or not. Requested she bring in her medications tomorrow for full med rec.    Approximate time spent with patient: 10  Minutes.    Kennon Holter, PharmD, CPP

## 2017-10-23 NOTE — Unmapped (Signed)
Pt received to chair 53. Pt will be getting platelets today for plts of 10. MD paged for clarification order for plt par

## 2017-10-23 NOTE — Unmapped (Addendum)
Please return as scheduled for a follow up visit and labs and infusions.    Please call us if you experience: ??  1. Nausea or vomiting not controlled by nausea medicines  2. Fever of 100.5 F or higher, shaking chills, drenching night sweats.  3. Uncontrolled pain  4. Any rapidly enlarging lymph node or mass  5. Unintentional weight loss  6. Any other concerning symptom     MyChart Messages  For your safety and best care, please DO NOT use MyChart messages to report symptoms. (Symptoms should be reported by calling the nurse triage line). Please use MyChart for non-urgent matters such as general questions, non-urgent prescription refills, or non-urgent scheduling issues.     ?? Please do not use MyChart for URGENT messages, as messages are only checked during regular business hours.     ?? Please note that MyChart messages may be routed a central pool and one of your provider???s team members will get back to you.  - Expect up to 3 business days for response     If you have any other questions, please do not hesitate to contact us.    Nurse Navigator: Frankey Poot, RN ??    For health related questions Monday through Friday 8 AM??? 5 PM : please call the office at [phone] and ask for Frankey Poot, RN, our nurse navigator.   For appointment changes call: Main Clinic [phone].  Toll free number is [phone].    On Nights, Weekends and Holidays:  Call [phone] and ask for the adult hematologist/oncologist on call.      [facility]  [address]  [place], [place] [address]  [email or web address]    Results for orders placed or performed in visit on 10/23/17   Comprehensive Metabolic Panel   Result Value Ref Range    Sodium 142 135 - 145 mmol/L    Potassium 3.8 3.5 - 5.0 mmol/L    Chloride 108 (H) 98 - 107 mmol/L    CO2 23.0 22.0 - 30.0 mmol/L    Anion Gap 11 9 - 15 mmol/L    BUN 19 7 - 21 mg/dL    Creatinine 7.25 3.66 - 1.00 mg/dL    BUN/Creatinine Ratio 22     Glucose 131 65 - 179 mg/dL    Calcium 9.3 8.5 - 44.0 mg/dL    Albumin 4.3 3.5 - 5.0 g/dL    Total Protein 7.5 6.5 - 8.3 g/dL    Total Bilirubin 0.7 0.0 - 1.2 mg/dL    AST 26 14 - 38 U/L    ALT 24 15 - 48 U/L    Alkaline Phosphatase 88 38 - 126 U/L    EGFR MDRD Af Amer >=60 >=60 mL/min/1.49m2    EGFR MDRD Non Af Amer >=60 >=60 mL/min/1.35m2   CBC w/ Differential   Result Value Ref Range    WBC 11.5 (H) 4.5 - 11.0 10*9/L    RBC 4.01 4.00 - 5.20 10*12/L    HGB 12.5 12.0 - 16.0 g/dL    HCT 34.7 42.5 - 95.6 %    MCV 91.7 80.0 - 100.0 fL    MCH 31.2 26.0 - 34.0 pg    MCHC 34.0 31.0 - 37.0 g/dL    RDW 38.7 (H) 56.4 - 15.0 %    MPV 11.4 (H) 7.0 - 10.0 fL    Platelet 10 (L) 150 - 440 10*9/L    Variable HGB Concentration Slight (A) Not Present    Neutrophils %  22.8 %    Lymphocytes % 67.3 %    Monocytes % 1.5 %    Eosinophils % 2.2 %    Basophils % 0.8 %    Absolute Neutrophils 2.6 2.0 - 7.5 10*9/L    Absolute Lymphocytes 7.8 (H) 1.5 - 5.0 10*9/L    Absolute Monocytes 0.2 0.2 - 0.8 10*9/L    Absolute Eosinophils 0.3 0.0 - 0.4 10*9/L    Absolute Basophils 0.1 0.0 - 0.1 10*9/L    Large Unstained Cells 5 (H) 0 - 4 %    Macrocytosis Slight (A) Not Present    Anisocytosis Slight (A) Not Present    Hypochromasia Slight (A) Not Present

## 2017-10-23 NOTE — Unmapped (Signed)
1610:  Labs drawn and sent for analysis.  Care provided by  Mickeal Needy, RN

## 2017-10-23 NOTE — Unmapped (Signed)
Venetoclax and Obinutuzumab    Cycle 1  ? Week 1  o Day 1: Obinutuzumab (IV) 100 mg   o Day 2: Obinutuzumab (IV) 900 mg  ? Week 2  o Day 8: Obinutuzumab (IV) 1000 mg  ? Week 3  o Day 15: Obinutuzumab (IV)  1000 mg  ? Week 4  o Day 22: Venetoclax 20 mg oral ??? once daily with food (Labs pre-dose, 6-8 hours after, and 24 hours after)  Cycle 2  ? Week 1  o Day 1: Obinutuzumab (IV) 1000 mg AND Venetoclax 50 mg oral ??? once daily with food (Labs pre-dose, 6-8 hours after, and 24 hours after)  ? Week 2  o Day 8: Venetoclax 100 mg oral ??? once daily with food  ? Week 3  o Day 15: Venetoclax 200 mg oral ??? once daily with food  ? Week 4  o Day 22: Venetoclax 400 mg oral ??? once daily with food      Cycle 3 --> Continue Venetoclax 400 mg once daily with food  ? Week 1  o Day 1: Obinutuzumab (IV) 1000 mg    Cycle 4 --> Continue Venetoclax 400 mg once daily with food  ? Week 1  o Day 1: Obinutuzumab (IV) 1000 mg    Cycle 5 --> Continue Venetoclax 400 mg once daily with food  ? Week 1  o Day 1: Obinutuzumab (IV) 1000 mg    Cycle 6 --> Continue Venetoclax 400 mg once daily with food  ? Week 1  o Day 1: Obinutuzumab (IV) 1000 mg    Cycles 7 through 12 --> Continue Venetoclax 400 mg once daily with food

## 2017-10-23 NOTE — Unmapped (Signed)
[facility] Follow Up       Patient Name: Katrina Gould  Patient Age: 77 y.o.  Encounter Date: 10/23/2017      Chief complaint/Reason for visit: CLL/SLL      Assessment:  Katrina Gould is a 77 y.o. female who presents for follow of her CLL/SLL, Rai stage 4. Disease markers: 46,XX,der(4)t(4;4)(p14;q21). IGHV unmutated (0%, 3-33*01). B75m 5.05. No TP53 mutation. High-risk by CLL-IPI (6 points).    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 I reviewed that we need to consider more definitive treatment for her CLL. We will proceed with ven-obi (CLL14)    She will return in 1 week for check prior to day 8 obi.     Baseline CT chest showed non-bulky disease however CT a/p was not performed despite my ordering it/requesting it (will get this in next week) - if a/p also non-bulky, we should be able to do her ven ramp up (beginning on day 22) as an outpt.    Local onc: Dr. Merlene Pulling    Plans and Recommendations:  1. CLL, Rai 4   - plt transfusion today  - begin obi today  - returns tomorrow for day 2 and again on days 8 and 15 (with an MD/NP visit)  - transfuse for plt <20; cont to check ~2x week  - cont allopurinol  - depending on results of CT a/p will decide on whether can do inpt vs outpt ven rampup (likely will be ok for outpt rampup) - ven begins on day 22    2. Health Maintenance  - received Influenza vaccination this past season  - continue annual dermatology follow up  - need to f/u at future visit whether has received second shingrix locally    3. Headaches  - Ongoing, chronic  - continue to follow with PCP and neurologist  - no changes to plan    Due to this patient's diagnosis, she is at significant risk for subsequent morbidity and/or mortality.      Interval History:  Patient is doing fair. Still with low platelets- bruising but no bleeding. Energy is low but stable.     Otherwise, she denies new constitutional symptoms such as anorexia, weight loss, drenching night sweats or unexplained fevers.  Furthermore, she denies symptoms of marrow failure:  recurrent or unexplained intercurrent infections.  There have been no new or unexplained pains or self-identified masses, swelling or enlarged lymph nodes.    Past Medical, Surgical and Family History were reviewed and pertinent updates were made in the Electronic Medical Record      REVIEW OF SYSTEMS: 12 point review of systems negative unless indicated in interval history.      Oncology History:    Oncology History    CLL    First seen in oncology clinic locally 03/31/16    Had a left axillary biopsy 03/15/16 that confirmed CLL/SLL: Flow showed monoclonal B-cell population with CD5, CD23, kappa light chain. IHC showed positive CD20 (diffuse), negative for cyclin D1.    No constitutional symptoms except some sweats    Labs: Hb 12.0, Hct 34.8, plt 147. WBC was 10.6 with ANC 2.5. ALC was 7.5K.    CLL FISH studies: normal panel. No cells with 11q, tri 12, 17p or 13q, and the 11;14 translocation also not detected.    CT c/a/p 04/04/16 showed mild LAD in chest, abd, and pelvis and spleen was at ULN (13 x  5.6 x 10.9 cm). Largest node 1.6 cm.    Of note is that she had a prior CT on 04/06/15 that showed similarly small enlarged node, largest being 1.9 cm.    CLL-IPI: As per Lancet Oncol Vol 15 October 2014  Includes (1) TP53 status (no abnormalities vs. Del 17p and/or TP53 mutation - 4 points) - no 17p (did not send TP53 mutation testing), (2) IGHV mutation status (mut vs unmut - 2 points) (pending) (3) serum B2-microglobulin (</=3.5 vs >3.5 - 2 points) (gets 2 points here) (4) clinical stage (Rai 0 vs Rai I-IV - 1 point) (gets 1 point here) and (5) age (</=65 vs >65 - 1 point)  (gets 1 point here)     Low risk (0-1 points)= 93.2% OS at 5 years  Int risk (2-3 points)= 79.3%  High risk (4-6 points)= 63.3%  - this patient has 4 points already, pending IGHV testing and prior to treatment would also need to send TP53 mutation testing  Very high risk (7-10 points)= 23.3%               CLL (chronic lymphocytic leukemia) (CMS-HCC)    05/16/2016 Initial Diagnosis     CLL (chronic lymphocytic leukemia) (RAF-HCC)           Chronic lymphocytic leukemia, Rai stage IV (CMS-HCC)    09/05/2017 - 10/11/2017 Chemotherapy     Chemotherapy Treatment    Treatment Goal Control   Line of Treatment [No plan line of treatment]   Plan Name OP RITUXIMAB    Start Date 09/14/2017   End Date 10/05/2017   Provider Pernell Dupre, MD   Chemotherapy riTUXimab (RITUXAN) 712.5 mg in sodium chloride (NS) 0.9 % 500 mL IVPB, 375 mg/m2 = 712.5 mg, Intravenous, Once, 1 of 1 cycle  Administration: 712.5 mg (09/14/2017)  riTUXimab (RITUXAN) 712.5 mg in sodium chloride (NS) 0.9 % 250 mL rapid infusion, 375 mg/m2 = 712.5 mg, Intravenous, Once, 1 of 1 cycle  Administration: 712.5 mg (09/21/2017), 712.5 mg (09/28/2017), 712.5 mg (10/05/2017)            10/16/2017 Initial Diagnosis     Chronic lymphocytic leukemia, Rai stage IV (CMS-HCC)         10/23/2017 -  Chemotherapy     Chemotherapy Treatment    Treatment Goal Control   Line of Treatment [No plan line of treatment]   Plan Name OP OBINUTUZUMAB AND VENETOCLAX   Start Date 10/23/2017   End Date 03/13/2018 (Planned)   Provider Pernell Dupre, MD   Chemotherapy dexamethasone (DECADRON) tablet 20 mg, 20 mg, Oral, Once, 0 of 6 cycles  obinutuzumab 100 mg in sodium chloride (NS) 0.9 % 250 mL IVPB, 100 mg, Intravenous, Once, 0 of 6 cycles               Social history:  Here with son  Lives alone  Husband died in October 24, 2008      Allergies   Allergen Reactions   ??? Sertraline Other (See Comments)     Nervousness, panic, shortness of breath   ??? Topiramate Palpitations     Panic attacks; unable to sleep   ??? Baclofen      made me go crazy         Current Outpatient Medications   Medication Sig Dispense Refill   ??? allopurinol (ZYLOPRIM) 300 MG tablet Take 1 tablet (300 mg total) by mouth daily. 30 tablet 2 ??? azithromycin (ZITHROMAX Z-PAK) 250 MG tablet Take 2 tablets (  500 mg) on  Day 1,  followed by 1 tablet (250 mg) once daily on Days 2 through 5. 1 Package 0   ??? LORazepam (ATIVAN) 0.5 MG tablet Take 0.5 mg by mouth two (2) times a day as needed.      ??? mirtazapine (REMERON) 7.5 MG tablet Take 7.5 mg by mouth nightly.     ??? multivitamin (TAB-A-VITE/THERAGRAN) per tablet Take 1 tablet by mouth daily.      ??? pantoprazole (PROTONIX) 40 MG tablet Take by mouth daily at 0600.      ??? pravastatin (PRAVACHOL) 80 MG tablet TAKE 1 TABLET BY MOUTH EVERY NIGHT AT BEDTIME     ??? venetoclax (VENCLEXTA STARTING PACK) 10 mg-50 mg- 100 mg tablet Take two 10mg  tabs qday x7; one 50mg  tab qday x7; one 100mg  tab qday x7; two 100mg  tabs qday x7. 1 Package 0     No current facility-administered medications for this visit.          Physical exam:  Vitals:    10/23/17 0816   BP: 141/65   Pulse: 71   Resp: 19   Temp: 36.3 ??C (97.4 ??F)   SpO2: 97%       General: in NAD, pleasant, comfortable appearing  HEENT:  PERRL. EOMI. No oropharyngeal ulceration or erythema.   Lymph node exam:  No lymphadenopathy in the occipital, auricular, anterior/posterior cervical, submental, supraclavicular, inguinal regions. Stable, sub-centimeter axillary lymphadenopathy (b/l).  CV:  RRR, no murmurs, rubs, or gallops. Peripheral pulses 2+ b/l.   Resp:  CTAB, no wheezes or crackles. Comfortable on RA. No increased work of breathing.   Gastrointestinal: Soft, non-distended, non-tender to palpation. No hepatosplenomegaly. Normoactive bowel sounds.   Skin:  No rashes, petechiae or purpura.  No areas of skin breakdown.  Musculoskeletal:  FROM of extremities. No joint effusions or tenderness.   Psychiatric:  Alert and oriented to person, place, time and situation. Appropriate affect and mood.     Neurologic:  No focal deficits. CN II-XII intact. Normal gait.   Extremities:  No clubbing or cyanosis. No peripheral edema.     ECOG Performance Status: 1    Orders/Results: Labs and pathology have been reviewed and pertinent results are as follows:    Results for orders placed or performed during the hospital encounter of 10/18/17   Platelet count   Result Value Ref Range    Platelet 12 (L) 150 - 440 10*9/L   Platelet count   Result Value Ref Range    Platelet 28 (L) 150 - 440 10*9/L   Prepare Platelet Pheresis   Result Value Ref Range    Unit Blood Type A Pos     ISBT Number 6200     Unit # [id number]     Status Transfused     Product ID Platelets     PRODUCT CODE [id number]    Prepare Platelet Pheresis   Result Value Ref Range    Unit Blood Type B Pos     ISBT Number 7300     Unit # [id number]     Status Transfused     Product ID Platelets     PRODUCT CODE [id number]

## 2017-10-24 ENCOUNTER — Ambulatory Visit: Admit: 2017-10-24 | Discharge: 2017-10-25 | Payer: MEDICARE

## 2017-10-24 ENCOUNTER — Other Ambulatory Visit: Admit: 2017-10-24 | Discharge: 2017-10-25 | Payer: MEDICARE

## 2017-10-24 DIAGNOSIS — D591 Other autoimmune hemolytic anemias: Secondary | ICD-10-CM

## 2017-10-24 DIAGNOSIS — C9110 Chronic lymphocytic leukemia of B-cell type not having achieved remission: Principal | ICD-10-CM

## 2017-10-24 LAB — CBC W/ AUTO DIFF
BASOPHILS ABSOLUTE COUNT: 0.1 10*9/L (ref 0.0–0.1)
BASOPHILS RELATIVE PERCENT: 0.5 %
EOSINOPHILS ABSOLUTE COUNT: 0 10*9/L (ref 0.0–0.4)
EOSINOPHILS RELATIVE PERCENT: 0.2 %
HEMATOCRIT: 32.8 % — ABNORMAL LOW (ref 36.0–46.0)
HEMOGLOBIN: 11.5 g/dL — ABNORMAL LOW (ref 12.0–16.0)
LARGE UNSTAINED CELLS: 5 % — ABNORMAL HIGH (ref 0–4)
LYMPHOCYTES ABSOLUTE COUNT: 8.2 10*9/L — ABNORMAL HIGH (ref 1.5–5.0)
MEAN CORPUSCULAR HEMOGLOBIN CONC: 35.1 g/dL (ref 31.0–37.0)
MEAN CORPUSCULAR HEMOGLOBIN: 31.9 pg (ref 26.0–34.0)
MEAN CORPUSCULAR VOLUME: 91 fL (ref 80.0–100.0)
MONOCYTES ABSOLUTE COUNT: 0.2 10*9/L (ref 0.2–0.8)
MONOCYTES RELATIVE PERCENT: 1.4 %
NEUTROPHILS ABSOLUTE COUNT: 7.2 10*9/L (ref 2.0–7.5)
NEUTROPHILS RELATIVE PERCENT: 43.4 %
PLATELET COUNT: 28 10*9/L — ABNORMAL LOW (ref 150–440)
RED BLOOD CELL COUNT: 3.6 10*12/L — ABNORMAL LOW (ref 4.00–5.20)
RED CELL DISTRIBUTION WIDTH: 16.3 % — ABNORMAL HIGH (ref 12.0–15.0)
WBC ADJUSTED: 16.5 10*9/L — ABNORMAL HIGH (ref 4.5–11.0)

## 2017-10-24 LAB — COMPREHENSIVE METABOLIC PANEL
ALBUMIN: 4.2 g/dL (ref 3.5–5.0)
ALKALINE PHOSPHATASE: 88 U/L (ref 38–126)
ALT (SGPT): 19 U/L (ref 15–48)
ANION GAP: 13 mmol/L (ref 9–15)
AST (SGOT): 25 U/L (ref 14–38)
BLOOD UREA NITROGEN: 22 mg/dL — ABNORMAL HIGH (ref 7–21)
CALCIUM: 9.6 mg/dL (ref 8.5–10.2)
CHLORIDE: 110 mmol/L — ABNORMAL HIGH (ref 98–107)
CO2: 19 mmol/L — ABNORMAL LOW (ref 22.0–30.0)
CREATININE: 0.84 mg/dL (ref 0.60–1.00)
EGFR CKD-EPI AA FEMALE: 77 mL/min/{1.73_m2} (ref >=60)
EGFR CKD-EPI NON-AA FEMALE: 67 mL/min/{1.73_m2} (ref >=60)
GLUCOSE RANDOM: 158 mg/dL (ref 65–179)
POTASSIUM: 3.8 mmol/L (ref 3.5–5.0)
PROTEIN TOTAL: 7.3 g/dL (ref 6.5–8.3)
SODIUM: 142 mmol/L (ref 135–145)

## 2017-10-24 LAB — SMEAR REVIEW

## 2017-10-24 LAB — EGFR CKD-EPI NON-AA FEMALE: Lab: 67

## 2017-10-24 LAB — BASOPHILS ABSOLUTE COUNT: Lab: 0.1

## 2017-10-24 NOTE — Unmapped (Signed)
Patient arrived to chair 38.  No complaints noted except for constipation.  Access of PIV LW intact with blood return. Message sent to Dr. Lonni Fix.  Patient would like a port ordered and placed d/t poor vein access.     Patient completed and tolerated treatment.  AVS given and patient discharged to home.

## 2017-10-24 NOTE — Unmapped (Signed)
[facility] Triage Note     Patient: Katrina Gould     Reason for call:      Time call returned:      Phone Assessment:      Triage Recommendations:      Patient Response:      Outstanding tasks:

## 2017-10-24 NOTE — Unmapped (Signed)
Hi,     Katrina Gould contacted the Communication Center regarding the following:    - Patient is currently in infusion. She states that she just missed a call from Dr. Lonni Fix.    Please contact Ms. Woodbeck at [phone].    Thanks in advance,    Kelli Hope  [facility]   [phone]

## 2017-10-24 NOTE — Unmapped (Signed)
5409:  Labs drawn and sent for analysis.  Care provided by  Niel Hummer, RN

## 2017-10-24 NOTE — Unmapped (Signed)
If you feel like this is an emergency please call 911.  For appointments or questions Monday through Friday 8AM-5PM please call [phone] or Toll Free [phone]. For Medical questions or concerns ask for the Nurse Triage Line.  On Nights, Weekends, and Holidays call [phone] and ask for the Oncologist on Call.  Reasons to call the Nurse Triage Line:  Fever of 100.5 or greater  Nausea and/or vomiting not relieved with nausea medicine  Diarrhea or constipation  Severe pain not relieved with usual pain regimen  Shortness of breath  Uncontrolled bleeding  Mental status changes    Hospital Outpatient Visit on 10/23/2017   Component Date Value Ref Range Status   ??? Unit Blood Type 10/23/2017 A Pos   Final   ??? ISBT Number 10/23/2017 6200   Final   ??? Unit # 10/23/2017 [id number]   Final   ??? Status 10/23/2017 Released to Avail   Final   ??? Product ID 10/23/2017 Platelets   Final   ??? PRODUCT CODE 10/23/2017 [id number]   Final   ??? Unit Blood Type 10/23/2017 O Pos   Final   ??? ISBT Number 10/23/2017 5100   Final   ??? Unit # 10/23/2017 [id number]   Final   ??? Status 10/23/2017 Issued   Final   ??? Product ID 10/23/2017 Platelets   Final   ??? PRODUCT CODE 10/23/2017 [id number]   Final   ??? Platelet 10/23/2017 7* 150 - 440 10*9/L Final   ??? Unit Blood Type 10/23/2017 A Pos   Final   ??? ISBT Number 10/23/2017 6200   Final   ??? Unit # 10/23/2017 [id number]   Final   ??? Status 10/23/2017 Issued   Final   ??? Product ID 10/23/2017 Platelets   Final   ??? PRODUCT CODE 10/23/2017 [id number]   Final   Lab on 10/23/2017   Component Date Value Ref Range Status   ??? Sodium 10/23/2017 142  135 - 145 mmol/L Final   ??? Potassium 10/23/2017 3.8  3.5 - 5.0 mmol/L Final   ??? Chloride 10/23/2017 108* 98 - 107 mmol/L Final   ??? CO2 10/23/2017 23.0  22.0 - 30.0 mmol/L Final   ??? Anion Gap 10/23/2017 11  9 - 15 mmol/L Final   ??? BUN 10/23/2017 19  7 - 21 mg/dL Final   ??? Creatinine 10/23/2017 0.86  0.60 - 1.00 mg/dL Final   ??? BUN/Creatinine Ratio 10/23/2017 22   Final   ??? Glucose 10/23/2017 131  65 - 179 mg/dL Final   ??? Calcium 83/15/1761 9.3  8.5 - 10.2 mg/dL Final   ??? Albumin 60/73/7106 4.3  3.5 - 5.0 g/dL Final   ??? Total Protein 10/23/2017 7.5  6.5 - 8.3 g/dL Final   ??? Total Bilirubin 10/23/2017 0.7  0.0 - 1.2 mg/dL Final   ??? AST 26/94/8546 26  14 - 38 U/L Final   ??? ALT 10/23/2017 24  15 - 48 U/L Final   ??? Alkaline Phosphatase 10/23/2017 88  38 - 126 U/L Final   ??? EGFR MDRD Af Amer 10/23/2017 >=60  >=60 mL/min/1.84m2 Final   ??? EGFR MDRD Non Af Amer 10/23/2017 >=60  >=60 mL/min/1.38m2 Final   ??? WBC 10/23/2017 11.5* 4.5 - 11.0 10*9/L Final   ??? RBC 10/23/2017 4.01  4.00 - 5.20 10*12/L Final   ??? HGB 10/23/2017 12.5  12.0 - 16.0 g/dL Final   ??? HCT 27/06/5007 36.8  36.0 - 46.0 % Final   ??? MCV 10/23/2017 91.7  80.0 -  100.0 fL Final   ??? MCH 10/23/2017 31.2  26.0 - 34.0 pg Final   ??? MCHC 10/23/2017 34.0  31.0 - 37.0 g/dL Final   ??? RDW 16/01/9603 16.1* 12.0 - 15.0 % Final   ??? MPV 10/23/2017 11.4* 7.0 - 10.0 fL Final   ??? Platelet 10/23/2017 10* 150 - 440 10*9/L Final    Questionable result confirmed. Specimen integrity/identity confirmed.    ??? Variable HGB Concentration 10/23/2017 Slight* Not Present Final   ??? Neutrophils % 10/23/2017 22.8  % Final   ??? Lymphocytes % 10/23/2017 67.3  % Final   ??? Monocytes % 10/23/2017 1.5  % Final   ??? Eosinophils % 10/23/2017 2.2  % Final   ??? Basophils % 10/23/2017 0.8  % Final   ??? Absolute Neutrophils 10/23/2017 2.6  2.0 - 7.5 10*9/L Final   ??? Absolute Lymphocytes 10/23/2017 7.8* 1.5 - 5.0 10*9/L Final   ??? Absolute Monocytes 10/23/2017 0.2  0.2 - 0.8 10*9/L Final   ??? Absolute Eosinophils 10/23/2017 0.3  0.0 - 0.4 10*9/L Final   ??? Absolute Basophils 10/23/2017 0.1  0.0 - 0.1 10*9/L Final   ??? Large Unstained Cells 10/23/2017 5* 0 - 4 % Final   ??? Macrocytosis 10/23/2017 Slight* Not Present Final   ??? Anisocytosis 10/23/2017 Slight* Not Present Final   ??? Hypochromasia 10/23/2017 Slight* Not Present Final   ??? Smear Review Comments 10/23/2017 See Comment* Undefined Final    Smudge Cells present.

## 2017-10-24 NOTE — Unmapped (Signed)
If you feel like this is an emergency please call 911.  For appointments or questions Monday through Friday 8AM-5PM please call [phone] or Toll Free [phone]. For Medical questions or concerns ask for the Nurse Triage Line.  On Nights, Weekends, and Holidays call [phone] and ask for the Oncologist on Call.  Reasons to call the Nurse Triage Line:  Fever of 100.5 or greater  Nausea and/or vomiting not relived with nausea medicine  Diarrhea or constipation  Severe pain not relieved with usual pain regimen  Shortness of breath  Uncontrolled bleeding  Mental status changes    Lab on 10/24/2017   Component Date Value Ref Range Status   ??? Sodium 10/24/2017 142  135 - 145 mmol/L Final   ??? Potassium 10/24/2017 3.8  3.5 - 5.0 mmol/L Final   ??? Chloride 10/24/2017 110* 98 - 107 mmol/L Final   ??? CO2 10/24/2017 19.0* 22.0 - 30.0 mmol/L Final   ??? Anion Gap 10/24/2017 13  9 - 15 mmol/L Final   ??? BUN 10/24/2017 22* 7 - 21 mg/dL Final   ??? Creatinine 10/24/2017 0.84  0.60 - 1.00 mg/dL Final   ??? BUN/Creatinine Ratio 10/24/2017 26   Final   ??? EGFR CKD-EPI Non-African American,* 10/24/2017 67  >=60 mL/min/1.37m2 Final   ??? EGFR CKD-EPI African American, Fem* 10/24/2017 77  >=60 mL/min/1.85m2 Final   ??? Glucose 10/24/2017 158  65 - 179 mg/dL Final   ??? Calcium 02/72/5366 9.6  8.5 - 10.2 mg/dL Final   ??? Albumin 44/06/4740 4.2  3.5 - 5.0 g/dL Final   ??? Total Protein 10/24/2017 7.3  6.5 - 8.3 g/dL Final   ??? Total Bilirubin 10/24/2017 0.6  0.0 - 1.2 mg/dL Final   ??? AST 59/56/3875 25  14 - 38 U/L Final   ??? ALT 10/24/2017 19  15 - 48 U/L Final   ??? Alkaline Phosphatase 10/24/2017 88  38 - 126 U/L Final   ??? WBC 10/24/2017 16.5* 4.5 - 11.0 10*9/L Final   ??? RBC 10/24/2017 3.60* 4.00 - 5.20 10*12/L Final   ??? HGB 10/24/2017 11.5* 12.0 - 16.0 g/dL Final   ??? HCT 64/33/2951 32.8* 36.0 - 46.0 % Final   ??? MCV 10/24/2017 91.0  80.0 - 100.0 fL Final   ??? MCH 10/24/2017 31.9  26.0 - 34.0 pg Final   ??? MCHC 10/24/2017 35.1  31.0 - 37.0 g/dL Final ??? RDW 88/41/6606 16.3* 12.0 - 15.0 % Final   ??? MPV 10/24/2017 9.3  7.0 - 10.0 fL Final   ??? Platelet 10/24/2017 28* 150 - 440 10*9/L Final   ??? Variable HGB Concentration 10/24/2017 Slight* Not Present Final   ??? Neutrophils % 10/24/2017 43.4  % Final   ??? Lymphocytes % 10/24/2017 49.6  % Final   ??? Monocytes % 10/24/2017 1.4  % Final   ??? Eosinophils % 10/24/2017 0.2  % Final   ??? Basophils % 10/24/2017 0.5  % Final   ??? Neutrophil Left Shift 10/24/2017 1+* Not Present Final   ??? Absolute Neutrophils 10/24/2017 7.2  2.0 - 7.5 10*9/L Final   ??? Absolute Lymphocytes 10/24/2017 8.2* 1.5 - 5.0 10*9/L Final   ??? Absolute Monocytes 10/24/2017 0.2  0.2 - 0.8 10*9/L Final   ??? Absolute Eosinophils 10/24/2017 0.0  0.0 - 0.4 10*9/L Final   ??? Absolute Basophils 10/24/2017 0.1  0.0 - 0.1 10*9/L Final   ??? Large Unstained Cells 10/24/2017 5* 0 - 4 % Final   ??? Macrocytosis 10/24/2017 Slight* Not  Present Final   ??? Anisocytosis 10/24/2017 Slight* Not Present Final   ??? Hypochromasia 10/24/2017 Slight* Not Present Final   ??? Smear Review Comments 10/24/2017 See Comment* Undefined Final    Smudge Cells present.

## 2017-10-26 ENCOUNTER — Other Ambulatory Visit: Admit: 2017-10-26 | Discharge: 2017-10-27 | Payer: MEDICARE

## 2017-10-26 ENCOUNTER — Ambulatory Visit: Admit: 2017-10-26 | Discharge: 2017-10-27 | Payer: MEDICARE

## 2017-10-26 DIAGNOSIS — C9110 Chronic lymphocytic leukemia of B-cell type not having achieved remission: Principal | ICD-10-CM

## 2017-10-26 LAB — COMPREHENSIVE METABOLIC PANEL
ALBUMIN: 4 g/dL (ref 3.5–5.0)
ALKALINE PHOSPHATASE: 70 U/L (ref 38–126)
ALT (SGPT): 25 U/L (ref 15–48)
ANION GAP: 8 mmol/L — ABNORMAL LOW (ref 9–15)
AST (SGOT): 24 U/L (ref 14–38)
BILIRUBIN TOTAL: 0.4 mg/dL (ref 0.0–1.2)
BLOOD UREA NITROGEN: 26 mg/dL — ABNORMAL HIGH (ref 7–21)
BUN / CREAT RATIO: 32
CALCIUM: 9.1 mg/dL (ref 8.5–10.2)
CHLORIDE: 109 mmol/L — ABNORMAL HIGH (ref 98–107)
CO2: 22 mmol/L (ref 22.0–30.0)
CREATININE: 0.81 mg/dL (ref 0.60–1.00)
EGFR CKD-EPI AA FEMALE: 80 mL/min/{1.73_m2} (ref >=60)
EGFR CKD-EPI NON-AA FEMALE: 70 mL/min/{1.73_m2} (ref >=60)
GLUCOSE RANDOM: 84 mg/dL (ref 65–179)
POTASSIUM: 3.5 mmol/L (ref 3.5–5.0)
PROTEIN TOTAL: 6.8 g/dL (ref 6.5–8.3)

## 2017-10-26 LAB — CBC W/ AUTO DIFF
BASOPHILS ABSOLUTE COUNT: 0.1 10*9/L (ref 0.0–0.1)
BASOPHILS RELATIVE PERCENT: 0.5 %
EOSINOPHILS ABSOLUTE COUNT: 0.1 10*9/L (ref 0.0–0.4)
EOSINOPHILS RELATIVE PERCENT: 1.2 %
HEMOGLOBIN: 11.7 g/dL — ABNORMAL LOW (ref 12.0–16.0)
LARGE UNSTAINED CELLS: 3 % (ref 0–4)
LYMPHOCYTES ABSOLUTE COUNT: 6.8 10*9/L — ABNORMAL HIGH (ref 1.5–5.0)
LYMPHOCYTES RELATIVE PERCENT: 66.9 %
MEAN CORPUSCULAR HEMOGLOBIN: 31.3 pg (ref 26.0–34.0)
MEAN CORPUSCULAR VOLUME: 92.3 fL (ref 80.0–100.0)
MEAN PLATELET VOLUME: 9.4 fL (ref 7.0–10.0)
MONOCYTES ABSOLUTE COUNT: 0.2 10*9/L (ref 0.2–0.8)
MONOCYTES RELATIVE PERCENT: 2.4 %
NEUTROPHILS ABSOLUTE COUNT: 2.7 10*9/L (ref 2.0–7.5)
NEUTROPHILS RELATIVE PERCENT: 26.4 %
PLATELET COUNT: 44 10*9/L — ABNORMAL LOW (ref 150–440)
RED BLOOD CELL COUNT: 3.73 10*12/L — ABNORMAL LOW (ref 4.00–5.20)
RED CELL DISTRIBUTION WIDTH: 16.3 % — ABNORMAL HIGH (ref 12.0–15.0)
WBC ADJUSTED: 10.1 10*9/L (ref 4.5–11.0)

## 2017-10-26 LAB — ALBUMIN: Albumin:MCnc:Pt:Ser/Plas:Qn:: 4

## 2017-10-26 LAB — BASOPHILS ABSOLUTE COUNT: Lab: 0.1

## 2017-10-26 NOTE — Unmapped (Signed)
If you feel like this is an emergency please call 911.  For appointments or questions Monday through Friday 8AM-5PM please call [phone] or Toll Free [phone]. For Medical questions or concerns ask for the Nurse Triage Line.  On Nights, Weekends, and Holidays call [phone] and ask for the Oncologist on Call.  Reasons to call the Nurse Triage Line:  Fever of 100.5 or greater  Nausea and/or vomiting not relived with nausea medicine  Diarrhea or constipation  Severe pain not relieved with usual pain regimen  Shortness of breath  Uncontrolled bleeding  Mental status changes    Lab on 10/26/2017   Component Date Value Ref Range Status   ??? WBC 10/26/2017 10.1  4.5 - 11.0 10*9/L Final   ??? RBC 10/26/2017 3.73* 4.00 - 5.20 10*12/L Final   ??? HGB 10/26/2017 11.7* 12.0 - 16.0 g/dL Final   ??? HCT 02/72/5366 34.4* 36.0 - 46.0 % Final   ??? MCV 10/26/2017 92.3  80.0 - 100.0 fL Final   ??? MCH 10/26/2017 31.3  26.0 - 34.0 pg Final   ??? MCHC 10/26/2017 34.0  31.0 - 37.0 g/dL Final   ??? RDW 44/06/4740 16.3* 12.0 - 15.0 % Final   ??? MPV 10/26/2017 9.4  7.0 - 10.0 fL Final   ??? Platelet 10/26/2017 44* 150 - 440 10*9/L Final   ??? Variable HGB Concentration 10/26/2017 Slight* Not Present Final   ??? Neutrophils % 10/26/2017 26.4  % Final   ??? Lymphocytes % 10/26/2017 66.9  % Final   ??? Monocytes % 10/26/2017 2.4  % Final   ??? Eosinophils % 10/26/2017 1.2  % Final   ??? Basophils % 10/26/2017 0.5  % Final   ??? Absolute Neutrophils 10/26/2017 2.7  2.0 - 7.5 10*9/L Final   ??? Absolute Lymphocytes 10/26/2017 6.8* 1.5 - 5.0 10*9/L Final   ??? Absolute Monocytes 10/26/2017 0.2  0.2 - 0.8 10*9/L Final   ??? Absolute Eosinophils 10/26/2017 0.1  0.0 - 0.4 10*9/L Final   ??? Absolute Basophils 10/26/2017 0.1  0.0 - 0.1 10*9/L Final   ??? Large Unstained Cells 10/26/2017 3  0 - 4 % Final   ??? Macrocytosis 10/26/2017 Slight* Not Present Final   ??? Anisocytosis 10/26/2017 Slight* Not Present Final   ??? Hypochromasia 10/26/2017 Slight* Not Present Final

## 2017-10-26 NOTE — Unmapped (Signed)
Labs outside parameters for transfusion today.  No questions or concerns voiced.  PIV removed per protocol.  Pt left infusion center NAD, ambulatory escorted by family.

## 2017-10-26 NOTE — Unmapped (Signed)
1320:  Labs drawn and sent for analysis.  Care provided by  Docia Furl, RN

## 2017-10-29 ENCOUNTER — Ambulatory Visit: Admit: 2017-10-29 | Discharge: 2017-10-30 | Payer: MEDICARE

## 2017-10-29 DIAGNOSIS — C9110 Chronic lymphocytic leukemia of B-cell type not having achieved remission: Principal | ICD-10-CM

## 2017-10-29 NOTE — Unmapped (Signed)
Pre-procedure instructions completed. All questions answered. Instructed to take AM medications. Must have driver 18 years or older. Pt receiving plt transfusion prior to port placement in VIR on 7/3.

## 2017-10-30 ENCOUNTER — Ambulatory Visit: Admit: 2017-10-30 | Discharge: 2017-10-31 | Payer: MEDICARE | Attending: Adult Health | Primary: Adult Health

## 2017-10-30 ENCOUNTER — Other Ambulatory Visit: Admit: 2017-10-30 | Discharge: 2017-10-31 | Payer: MEDICARE

## 2017-10-30 ENCOUNTER — Ambulatory Visit: Admit: 2017-10-30 | Discharge: 2017-10-31 | Payer: MEDICARE

## 2017-10-30 DIAGNOSIS — D591 Other autoimmune hemolytic anemias: Secondary | ICD-10-CM

## 2017-10-30 DIAGNOSIS — C9110 Chronic lymphocytic leukemia of B-cell type not having achieved remission: Principal | ICD-10-CM

## 2017-10-30 LAB — CBC W/ AUTO DIFF
BASOPHILS ABSOLUTE COUNT: 0.1 10*9/L (ref 0.0–0.1)
BASOPHILS RELATIVE PERCENT: 0.7 %
EOSINOPHILS ABSOLUTE COUNT: 0.3 10*9/L (ref 0.0–0.4)
EOSINOPHILS RELATIVE PERCENT: 3.8 %
HEMOGLOBIN: 11.8 g/dL — ABNORMAL LOW (ref 12.0–16.0)
LARGE UNSTAINED CELLS: 4 % (ref 0–4)
LYMPHOCYTES ABSOLUTE COUNT: 3.5 10*9/L (ref 1.5–5.0)
LYMPHOCYTES RELATIVE PERCENT: 49.1 %
MEAN CORPUSCULAR HEMOGLOBIN CONC: 33.9 g/dL (ref 31.0–37.0)
MEAN CORPUSCULAR VOLUME: 89.9 fL (ref 80.0–100.0)
MEAN PLATELET VOLUME: 9.5 fL (ref 7.0–10.0)
MONOCYTES ABSOLUTE COUNT: 0.3 10*9/L (ref 0.2–0.8)
MONOCYTES RELATIVE PERCENT: 3.8 %
NEUTROPHILS ABSOLUTE COUNT: 2.7 10*9/L (ref 2.0–7.5)
NEUTROPHILS RELATIVE PERCENT: 38.2 %
PLATELET COUNT: 40 10*9/L — ABNORMAL LOW (ref 150–440)
RED BLOOD CELL COUNT: 3.88 10*12/L — ABNORMAL LOW (ref 4.00–5.20)
RED CELL DISTRIBUTION WIDTH: 15.8 % — ABNORMAL HIGH (ref 12.0–15.0)
WBC ADJUSTED: 7.1 10*9/L (ref 4.5–11.0)

## 2017-10-30 LAB — COMPREHENSIVE METABOLIC PANEL
ALBUMIN: 3.7 g/dL (ref 3.5–5.0)
ALKALINE PHOSPHATASE: 81 U/L (ref 38–126)
ANION GAP: 6 mmol/L — ABNORMAL LOW (ref 9–15)
BILIRUBIN TOTAL: 0.5 mg/dL (ref 0.0–1.2)
BLOOD UREA NITROGEN: 14 mg/dL (ref 7–21)
BUN / CREAT RATIO: 17
CALCIUM: 8.8 mg/dL (ref 8.5–10.2)
CHLORIDE: 110 mmol/L — ABNORMAL HIGH (ref 98–107)
CO2: 22 mmol/L (ref 22.0–30.0)
CREATININE: 0.82 mg/dL (ref 0.60–1.00)
EGFR CKD-EPI AA FEMALE: 79 mL/min/{1.73_m2} (ref >=60)
EGFR CKD-EPI NON-AA FEMALE: 69 mL/min/{1.73_m2} (ref >=60)
GLUCOSE RANDOM: 66 mg/dL (ref 65–179)
POTASSIUM: 3.6 mmol/L (ref 3.5–5.0)
PROTEIN TOTAL: 6.5 g/dL (ref 6.5–8.3)
SODIUM: 138 mmol/L (ref 135–145)

## 2017-10-30 LAB — MONOCYTES ABSOLUTE COUNT: Lab: 0.3

## 2017-10-30 LAB — SMEAR REVIEW

## 2017-10-30 LAB — ANION GAP: Anion gap 3:SCnc:Pt:Ser/Plas:Qn:: 6 — ABNORMAL LOW

## 2017-10-30 LAB — PLATELET COUNT: Lab: 49 — ABNORMAL LOW
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
## 2017-10-30 NOTE — Unmapped (Signed)
1035:  Labs drawn and sent for analysis.  Care provided by  Adolph Pollack, RN

## 2017-10-30 NOTE — Unmapped (Signed)
No lab results indicated for treatment today. Treatment plan released.

## 2017-10-31 ENCOUNTER — Other Ambulatory Visit: Admit: 2017-10-31 | Discharge: 2017-10-31 | Payer: MEDICARE

## 2017-10-31 ENCOUNTER — Ambulatory Visit: Admit: 2017-10-31 | Discharge: 2017-10-31 | Payer: MEDICARE

## 2017-10-31 DIAGNOSIS — C9110 Chronic lymphocytic leukemia of B-cell type not having achieved remission: Principal | ICD-10-CM

## 2017-10-31 LAB — CBC W/ AUTO DIFF
BASOPHILS ABSOLUTE COUNT: 0.1 10*9/L (ref 0.0–0.1)
BASOPHILS RELATIVE PERCENT: 0.7 %
EOSINOPHILS ABSOLUTE COUNT: 0 10*9/L (ref 0.0–0.4)
EOSINOPHILS RELATIVE PERCENT: 0.1 %
HEMATOCRIT: 35.6 % — ABNORMAL LOW (ref 36.0–46.0)
HEMOGLOBIN: 12 g/dL (ref 12.0–16.0)
LARGE UNSTAINED CELLS: 4 % (ref 0–4)
LYMPHOCYTES ABSOLUTE COUNT: 6.4 10*9/L — ABNORMAL HIGH (ref 1.5–5.0)
MEAN CORPUSCULAR HEMOGLOBIN CONC: 33.6 g/dL (ref 31.0–37.0)
MEAN CORPUSCULAR HEMOGLOBIN: 30.4 pg (ref 26.0–34.0)
MEAN CORPUSCULAR VOLUME: 90.4 fL (ref 80.0–100.0)
MEAN PLATELET VOLUME: 9.2 fL (ref 7.0–10.0)
MONOCYTES ABSOLUTE COUNT: 0.2 10*9/L (ref 0.2–0.8)
MONOCYTES RELATIVE PERCENT: 1.4 %
NEUTROPHILS ABSOLUTE COUNT: 5.9 10*9/L (ref 2.0–7.5)
NEUTROPHILS RELATIVE PERCENT: 44.9 %
PLATELET COUNT: 60 10*9/L — ABNORMAL LOW (ref 150–440)
RED BLOOD CELL COUNT: 3.94 10*12/L — ABNORMAL LOW (ref 4.00–5.20)
RED CELL DISTRIBUTION WIDTH: 15.5 % — ABNORMAL HIGH (ref 12.0–15.0)
WBC ADJUSTED: 13 10*9/L — ABNORMAL HIGH (ref 4.5–11.0)

## 2017-10-31 LAB — EOSINOPHILS ABSOLUTE COUNT: Lab: 0

## 2017-10-31 LAB — SMEAR REVIEW

## 2017-10-31 NOTE — Unmapped (Signed)
If you feel like this is an emergency please call 911.  For appointments or questions Monday through Friday 8AM-5PM please call [phone] or Toll Free [phone]. For Medical questions or concerns ask for the Nurse Triage Line.  On Nights, Weekends, and Holidays call [phone] and ask for the Oncologist on Call.  Reasons to call the Nurse Triage Line:  Fever of 100.5 or greater  Nausea and/or vomiting not relived with nausea medicine  Diarrhea or constipation  Severe pain not relieved with usual pain regimen  Shortness of breath  Uncontrolled bleeding  Mental status changes

## 2017-10-31 NOTE — Unmapped (Signed)
If you feel like this is an emergency please call 911.  For appointments or questions Monday through Friday 8AM-5PM please call [phone] or Toll Free [phone]. For Medical questions or concerns ask for the Nurse Triage Line.  On Nights, Weekends, and Holidays call [phone] and ask for the Oncologist on Call.  Reasons to call the Nurse Triage Line:  Fever of 100.5 or greater  Nausea and/or vomiting not relived with nausea medicine  Diarrhea or constipation  Severe pain not relieved with usual pain regimen  Shortness of breath  Uncontrolled bleeding  Mental status changes    Lab on 10/31/2017   Component Date Value Ref Range Status   ??? WBC 10/31/2017 13.0* 4.5 - 11.0 10*9/L Final   ??? RBC 10/31/2017 3.94* 4.00 - 5.20 10*12/L Final   ??? HGB 10/31/2017 12.0  12.0 - 16.0 g/dL Final   ??? HCT 57/84/6962 35.6* 36.0 - 46.0 % Final   ??? MCV 10/31/2017 90.4  80.0 - 100.0 fL Final   ??? MCH 10/31/2017 30.4  26.0 - 34.0 pg Final   ??? MCHC 10/31/2017 33.6  31.0 - 37.0 g/dL Final   ??? RDW 95/28/4132 15.5* 12.0 - 15.0 % Final   ??? MPV 10/31/2017 9.2  7.0 - 10.0 fL Final   ??? Platelet 10/31/2017 60* 150 - 440 10*9/L Final   ??? Variable HGB Concentration 10/31/2017 Slight* Not Present Final   ??? Neutrophils % 10/31/2017 44.9  % Final   ??? Lymphocytes % 10/31/2017 48.7  % Final   ??? Monocytes % 10/31/2017 1.4  % Final   ??? Eosinophils % 10/31/2017 0.1  % Final   ??? Basophils % 10/31/2017 0.7  % Final   ??? Neutrophil Left Shift 10/31/2017 3+* Not Present Final   ??? Absolute Neutrophils 10/31/2017 5.9  2.0 - 7.5 10*9/L Final   ??? Absolute Lymphocytes 10/31/2017 6.4* 1.5 - 5.0 10*9/L Final   ??? Absolute Monocytes 10/31/2017 0.2  0.2 - 0.8 10*9/L Final   ??? Absolute Eosinophils 10/31/2017 0.0  0.0 - 0.4 10*9/L Final   ??? Absolute Basophils 10/31/2017 0.1  0.0 - 0.1 10*9/L Final   ??? Large Unstained Cells 10/31/2017 4  0 - 4 % Final   ??? Hypochromasia 10/31/2017 Slight* Not Present Final

## 2017-10-31 NOTE — Unmapped (Signed)
Pt presents for lab check and possible platelet transfusion prior to port placement today. Plts 60 this morning, so no transfusion indicated. AVS given and reviewed. Pt discharged ambulatory to next appt.

## 2017-10-31 NOTE — Unmapped (Signed)
Pt is in clinic today for C1D8 of Obinutuzumab.   Pt in NAD, no complaints voiced, accompanied by friend.   PIV was placed prior to arrival to clinic, +BR confirmed.   Premeds are given per treatment plan.        1457: Obinutuzumab infusion initiated; completed at 1825. Pt tolerated infusion without complications.   1840: Platelet transfusion initiated in preparation for port placement next day.   1939: Platelet transfusion completed.  Pt VSS, in NAD.   Repeat platelet level was collected at 2014.     PIV discontinued, catheter tip intact, pressure dressing applied.   AVS handed to patient.   Pt is to be back in lab at 0800 next day.   Pt left clinic in NAD, ambulatory, in the presence of her friend.

## 2017-10-31 NOTE — Unmapped (Signed)
1610:  Labs drawn and sent for analysis.  Care provided by  Eppie Gibson, RN

## 2017-10-31 NOTE — Unmapped (Signed)
Because you received sedation today:  Rest for the next 24 hours.  Do not drive a car, operate heavy machinery, drink alcohol or sign legal documents for 24 hours.  Be careful when standing or walking in case you feel dizzy.    Avoid strenuous activity and heavy lifting (>10lbs) for 1 week.  Do not submerge site until healed. No tub baths, swimming pools until healed.    Under your dressing, you may have thin strips of white tape (steri strips) or surgical glue covering the incision on your chest. The incision is closed using dissolvable stitches.    If your port is NOT accessed (no needle):  Dressing stays intact for 2 days.  After two days, remove dressing and gently wash site.   Leave steri-strips or glue in place over incision. They will fall off/dissolve on their own in about 7-10 days.   Pat dry and apply new clean dressing (dry gauze and clear adhesive).   Wash site and change dressing daily until healed.    If your port IS accessed (has a needle):  Whenever port is accessed with needle, keep dressing over needle clean, dry and intact.   At your next appointment, transfusion nurse will perform care, flushing, dressing changes, and teaching.      After needle is removed, a dressing (gauze and adhesive) will be placed over site before you go home.   The following day you can remove the dressing and gently wash site. Leave steri-strips or glue in place over incision. They will fall off/dissolve on their own in about 7-10 days.   Pat dry and apply new clean dressing (dry gauze and adhesive).   Wash site and apply new dressing daily until healed.  --------------------------------------------------------------------------------    We expect that your incision site may be sore for the next 24 to 48 hours. You can take over the counter pain medications for this pain, or what you typically take at home for pain. You can also place an ice pack covered with a washcloth over the dressing on your incision. You can leave the ice pack on for 30 minutes, take off for 30 minutes and repeat as needed. This helps to reduce pain and swelling that can be associated with your new port placement.    You may return to your usual diet unless otherwise directed.    Please call [facility] clinic at [phone] and choose option 2 to speak to a nurse for any problems or questions following discharge including:  -Bleeding that saturates bandage  -Pain at site not controlled by regular pain medicines  -Signs of infection at site: redness, swelling, pus draining, fever >101.5  -Incision has opened up and the port is visible through the skin    Calls received during normal business hours (Monday through Friday from 8:00 am to 4:30 pm) will be returned within one business day. Calls received after normal business hours OR on weekends will be returned during the next business day.     If this is an after-hours urgent matter, you can call the Hospital front desk at [phone] and ask to speak with the Interventional Radiology (VIR) doctor on call.    If you are experiencing a medical emergency, please call 911 or go to your nearest emergency room.

## 2017-10-31 NOTE — Unmapped (Signed)
[facility]   POST-PROCEDURE NOTE       Procedure Name: IR INSERT PORT AGE GREATER THAN 5 YRS [[id number]]    Pre-Op Diagnosis: CLL    Post-Op Diagnosis: Same as pre-operative diagnosis    VIR Providers    Attending: Dr. Carmie End  Assistant: Dr. Reece Levy    Description of procedure: Successful right chest wall port insertion    Sedation: Moderate    Estimated Blood Loss: approximately 2 mL  Specimens: None   Contrast: 0 mL  Complications: None      See detailed procedure note with images in PACS [facility]).    The patient tolerated the procedure well without incident or complication and was returned to the PRU in stable condition.    Reece Levy, MD  10/31/2017 12:16 PM

## 2017-10-31 NOTE — Unmapped (Signed)
[facility] - Pre Procedure H/P      Assessment/Plan:    [name] is a 77 y.o. female who will undergo SL port catheter placement in Interventional Radiology with moderate sedation.     --This procedure has been fully reviewed with the patient/patient???s authorized representative. The risks, benefits and alternatives have been explained, and the patient/patient???s authorized representative has consented to the procedure.  --The patient will accept blood products in an emergent situation.  --The patient does not have a Do Not Resuscitate order in effect.      HPI: [name] is a 77 y.o. female with CLL here for SL port placement.    Allergies:   Allergies   Allergen Reactions   ??? Sertraline Other (See Comments)     Nervousness, panic, shortness of breath   ??? Topiramate Palpitations     Panic attacks; unable to sleep   ??? Baclofen      made me go crazy       Medications:  No relevant medications, please see full medication list in Epic.    ASA Grade: ASA 2 - Patient with mild systemic disease with no functional limitations    PE:    Vitals:    10/31/17 0943   BP: 179/89   Pulse: 76   Resp: 16   Temp: 36.8 ??C (98.3 ??F)   SpO2: 97%     General: female in NAD.  Airway assessment: Class 1 - Can visualize soft palate, fauces, uvula, and tonsillar pillars  Lungs: Respirations nonlabored    Redmond Baseman, FNP-C

## 2017-11-02 ENCOUNTER — Other Ambulatory Visit: Admit: 2017-11-02 | Discharge: 2017-11-03 | Payer: MEDICARE

## 2017-11-02 ENCOUNTER — Ambulatory Visit: Admit: 2017-11-02 | Discharge: 2017-11-03 | Payer: MEDICARE

## 2017-11-02 DIAGNOSIS — C9190 Lymphoid leukemia, unspecified not having achieved remission: Principal | ICD-10-CM

## 2017-11-02 DIAGNOSIS — C9110 Chronic lymphocytic leukemia of B-cell type not having achieved remission: Secondary | ICD-10-CM

## 2017-11-02 LAB — CBC W/ AUTO DIFF
BASOPHILS ABSOLUTE COUNT: 0.1 10*9/L (ref 0.0–0.1)
EOSINOPHILS ABSOLUTE COUNT: 0.1 10*9/L (ref 0.0–0.4)
EOSINOPHILS RELATIVE PERCENT: 1.7 %
HEMATOCRIT: 35 % — ABNORMAL LOW (ref 36.0–46.0)
HEMOGLOBIN: 11.8 g/dL — ABNORMAL LOW (ref 12.0–16.0)
LARGE UNSTAINED CELLS: 4 % (ref 0–4)
LYMPHOCYTES ABSOLUTE COUNT: 4.8 10*9/L (ref 1.5–5.0)
MEAN CORPUSCULAR HEMOGLOBIN CONC: 33.6 g/dL (ref 31.0–37.0)
MEAN CORPUSCULAR VOLUME: 91.5 fL (ref 80.0–100.0)
MEAN PLATELET VOLUME: 9.2 fL (ref 7.0–10.0)
MONOCYTES ABSOLUTE COUNT: 0.3 10*9/L (ref 0.2–0.8)
MONOCYTES RELATIVE PERCENT: 3.2 %
NEUTROPHILS ABSOLUTE COUNT: 2.2 10*9/L (ref 2.0–7.5)
NEUTROPHILS RELATIVE PERCENT: 28.2 %
RED BLOOD CELL COUNT: 3.82 10*12/L — ABNORMAL LOW (ref 4.00–5.20)
RED CELL DISTRIBUTION WIDTH: 15.7 % — ABNORMAL HIGH (ref 12.0–15.0)
WBC ADJUSTED: 7.8 10*9/L (ref 4.5–11.0)

## 2017-11-02 LAB — SMEAR REVIEW: Lab: 0

## 2017-11-02 LAB — HEMATOCRIT: Lab: 35 — ABNORMAL LOW

## 2017-11-02 NOTE — Unmapped (Signed)
Labs drawn and sent for analysis. Care provided by Isidor Holts, RN

## 2017-11-02 NOTE — Unmapped (Signed)
If you feel like this is an emergency please call 911.  For appointments or questions Monday through Friday 8AM-5PM please call [phone] or Toll Free [phone]. For Medical questions or concerns ask for the Nurse Triage Line.  On Nights, Weekends, and Holidays call [phone] and ask for the Oncologist on Call.  Reasons to call the Nurse Triage Line:  Fever of 100.5 or greater  Nausea and/or vomiting not relived with nausea medicine  Diarrhea or constipation  Severe pain not relieved with usual pain regimen  Shortness of breath  Uncontrolled bleeding  Mental status changes  Lab on 11/02/2017   Component Date Value Ref Range Status   ??? WBC 11/02/2017 7.8  4.5 - 11.0 10*9/L Final   ??? RBC 11/02/2017 3.82* 4.00 - 5.20 10*12/L Final   ??? HGB 11/02/2017 11.8* 12.0 - 16.0 g/dL Final   ??? HCT 25/95/6387 35.0* 36.0 - 46.0 % Final   ??? MCV 11/02/2017 91.5  80.0 - 100.0 fL Final   ??? MCH 11/02/2017 30.7  26.0 - 34.0 pg Final   ??? MCHC 11/02/2017 33.6  31.0 - 37.0 g/dL Final   ??? RDW 56/43/3295 15.7* 12.0 - 15.0 % Final   ??? MPV 11/02/2017 9.2  7.0 - 10.0 fL Final   ??? Platelet 11/02/2017 55* 150 - 440 10*9/L Final   ??? Variable HGB Concentration 11/02/2017 Slight* Not Present Final   ??? Neutrophils % 11/02/2017 28.2  % Final   ??? Lymphocytes % 11/02/2017 62.1  % Final   ??? Monocytes % 11/02/2017 3.2  % Final   ??? Eosinophils % 11/02/2017 1.7  % Final   ??? Basophils % 11/02/2017 0.8  % Final   ??? Absolute Neutrophils 11/02/2017 2.2  2.0 - 7.5 10*9/L Final   ??? Absolute Lymphocytes 11/02/2017 4.8  1.5 - 5.0 10*9/L Final   ??? Absolute Monocytes 11/02/2017 0.3  0.2 - 0.8 10*9/L Final   ??? Absolute Eosinophils 11/02/2017 0.1  0.0 - 0.4 10*9/L Final   ??? Absolute Basophils 11/02/2017 0.1  0.0 - 0.1 10*9/L Final   ??? Large Unstained Cells 11/02/2017 4  0 - 4 % Final   ??? Hypochromasia 11/02/2017 Slight* Not Present Final   ??? Smear Review Comments 11/02/2017   Undefined Final    Smear Reviewed

## 2017-11-02 NOTE — Unmapped (Unsigned)
Labs did not meet requirements for transfusion, patient asymptomatic. Discharged ambulatory with AVS.

## 2017-11-06 ENCOUNTER — Ambulatory Visit: Admit: 2017-11-06 | Discharge: 2017-11-07 | Payer: MEDICARE | Attending: Adult Health | Primary: Adult Health

## 2017-11-06 ENCOUNTER — Other Ambulatory Visit: Admit: 2017-11-06 | Discharge: 2017-11-07 | Payer: MEDICARE

## 2017-11-06 ENCOUNTER — Ambulatory Visit: Admit: 2017-11-06 | Discharge: 2017-11-07 | Payer: MEDICARE

## 2017-11-06 DIAGNOSIS — J40 Bronchitis, not specified as acute or chronic: Secondary | ICD-10-CM

## 2017-11-06 DIAGNOSIS — D591 Other autoimmune hemolytic anemias: Secondary | ICD-10-CM

## 2017-11-06 DIAGNOSIS — C9110 Chronic lymphocytic leukemia of B-cell type not having achieved remission: Principal | ICD-10-CM

## 2017-11-06 LAB — COMPREHENSIVE METABOLIC PANEL
ALBUMIN: 3.6 g/dL (ref 3.5–5.0)
ALKALINE PHOSPHATASE: 72 U/L (ref 38–126)
ALT (SGPT): 18 U/L (ref 15–48)
ANION GAP: 8 mmol/L — ABNORMAL LOW (ref 9–15)
AST (SGOT): 19 U/L (ref 14–38)
BILIRUBIN TOTAL: 0.4 mg/dL (ref 0.0–1.2)
BLOOD UREA NITROGEN: 17 mg/dL (ref 7–21)
CALCIUM: 9.1 mg/dL (ref 8.5–10.2)
CHLORIDE: 109 mmol/L — ABNORMAL HIGH (ref 98–107)
CO2: 22 mmol/L (ref 22.0–30.0)
CREATININE: 0.72 mg/dL (ref 0.60–1.00)
EGFR CKD-EPI AA FEMALE: >90 mL/min/{1.73_m2} (ref >=60)
GLUCOSE RANDOM: 85 mg/dL (ref 65–179)
POTASSIUM: 4.2 mmol/L (ref 3.5–5.0)
PROTEIN TOTAL: 6.3 g/dL — ABNORMAL LOW (ref 6.5–8.3)
SODIUM: 139 mmol/L (ref 135–145)

## 2017-11-06 LAB — CBC W/ AUTO DIFF
BASOPHILS ABSOLUTE COUNT: 0.1 10*9/L (ref 0.0–0.1)
BASOPHILS RELATIVE PERCENT: 0.9 %
EOSINOPHILS ABSOLUTE COUNT: 0.2 10*9/L (ref 0.0–0.4)
EOSINOPHILS RELATIVE PERCENT: 2.5 %
HEMATOCRIT: 36.2 % (ref 36.0–46.0)
HEMOGLOBIN: 12.2 g/dL (ref 12.0–16.0)
LARGE UNSTAINED CELLS: 3 % (ref 0–4)
LYMPHOCYTES ABSOLUTE COUNT: 3.8 10*9/L (ref 1.5–5.0)
LYMPHOCYTES RELATIVE PERCENT: 47.4 %
MEAN CORPUSCULAR HEMOGLOBIN CONC: 33.7 g/dL (ref 31.0–37.0)
MEAN CORPUSCULAR HEMOGLOBIN: 30.1 pg (ref 26.0–34.0)
MEAN CORPUSCULAR VOLUME: 89.4 fL (ref 80.0–100.0)
MEAN PLATELET VOLUME: 9.3 fL (ref 7.0–10.0)
MONOCYTES ABSOLUTE COUNT: 0.4 10*9/L (ref 0.2–0.8)
MONOCYTES RELATIVE PERCENT: 4.5 %
PLATELET COUNT: 34 10*9/L — ABNORMAL LOW (ref 150–440)
RED BLOOD CELL COUNT: 4.06 10*12/L (ref 4.00–5.20)
RED CELL DISTRIBUTION WIDTH: 15.8 % — ABNORMAL HIGH (ref 12.0–15.0)
WBC ADJUSTED: 8 10*9/L (ref 4.5–11.0)

## 2017-11-06 LAB — URIC ACID
URIC ACID: 4.2 mg/dL (ref 3.0–6.5)
Urate:MCnc:Pt:Ser/Plas:Qn:: 4.2

## 2017-11-06 LAB — EGFR CKD-EPI NON-AA FEMALE: Lab: 80

## 2017-11-06 LAB — RED CELL DISTRIBUTION WIDTH: Lab: 15.8 — ABNORMAL HIGH

## 2017-11-06 LAB — SMEAR REVIEW

## 2017-11-06 LAB — PHOSPHORUS: Phosphate:MCnc:Pt:Ser/Plas:Qn:: 3.6

## 2017-11-06 MED ORDER — LEVOFLOXACIN 750 MG TABLET
ORAL_TABLET | Freq: Every day | ORAL | 0 refills | 0.00000 days | Status: CP
Start: 2017-11-06 — End: 2017-11-27

## 2017-11-06 NOTE — Unmapped (Signed)
1030: Port accessed; labs drawn and sent for analysis.

## 2017-11-06 NOTE — Unmapped (Signed)
[facility] Follow Up       Patient Name: Katrina Gould  Patient Age: 77 y.o.  Encounter Date: 11/06/2017      Chief complaint/Reason for visit: CLL/SLL      Assessment:  Katrina Gould is a 77 y.o. female who presents for follow of her CLL/SLL, Rai stage 4. Disease markers: 46,XX,der(4)t(4;4)(p14;q21). IGHV unmutated (0%, 3-33*01). B77m 5.05. No TP53 mutation. High-risk by CLL-IPI (6 points).    [name] continues to do well.  She still has some green mucus that she coughs up, persistant from her bout with pneumonia.  CT on 10/23/17 with some RLL opacities possibly indicating infection.  Lungs are clear bilaterally.  Will do 10 day course of levofloxacin.  Plan to repeat Chest CT in about 4-6 weeks to ensure resolution.  No fevers or other b-symptoms.    Platelets stable at 34 today.  Okay to proceed with obinutuzumab day 15 today.  She will return in 1 week for venetoclax ramp up.    Local onc: Dr. Merlene Pulling    Plans and Recommendations:  1. CLL, Rai 4   - port placement went well  - obi C1D15 today  - RTC 1 week  - transfuse for plt <20; cont to check ~2x week  - cont allopurinol    2. Health Maintenance  - received Influenza vaccination this past season  - continue annual dermatology follow up  - still needs 2nd shingrix vaccine    3. Headaches  - improved    4. Bronchitis  - levaquin 750mg  PO dailly x 10 days  - opacities RLL on 10/23/17 Chest CT  - f/u Chest CT in 6 weeks    Due to this patient's diagnosis, she is at significant risk for subsequent morbidity and/or mortality.      Interval History:  Patient is doing well.  Overall, she is quite fatigued.  Reports that she is sleeping some.  Reports she wakes up 2-3 times to use the bathroom overnight.  Also reports that she has had an increase in gas since starting the Obi.  Has no tried gas-ex.  Burns on her arms are healing well.  Continues with night sweats - stable.  Coughing up green mucus.    Otherwise, she denies new constitutional symptoms such as anorexia, weight loss, drenching night sweats or unexplained fevers.  Furthermore, she denies symptoms of marrow failure:  recurrent or unexplained intercurrent infections.  There have been no new or unexplained pains or self-identified masses, swelling or enlarged lymph nodes.    Past Medical, Surgical and Family History were reviewed and pertinent updates were made in the Electronic Medical Record      REVIEW OF SYSTEMS: 12 point review of systems negative unless indicated in interval history.      Oncology History:    Oncology History    CLL    First seen in oncology clinic locally 03/31/16    Had a left axillary biopsy 03/15/16 that confirmed CLL/SLL: Flow showed monoclonal B-cell population with CD5, CD23, kappa light chain. IHC showed positive CD20 (diffuse), negative for cyclin D1.    No constitutional symptoms except some sweats    Labs: Hb 12.0, Hct 34.8, plt 147. WBC was 10.6 with ANC 2.5. ALC was 7.5K.    CLL FISH studies: normal panel. No cells with 11q, tri 12, 17p or 13q, and the 11;14 translocation also not detected.    CT c/a/p 04/04/16 showed mild LAD in chest, abd, and pelvis  and spleen was at ULN (13 x 5.6 x 10.9 cm). Largest node 1.6 cm.    Of note is that she had a prior CT on 04/06/15 that showed similarly small enlarged node, largest being 1.9 cm.    CLL-IPI: As per Lancet Oncol Vol 15 October 2014  Includes (1) TP53 status (no abnormalities vs. Del 17p and/or TP53 mutation - 4 points) - no 17p (did not send TP53 mutation testing), (2) IGHV mutation status (mut vs unmut - 2 points) (pending) (3) serum B2-microglobulin (</=3.5 vs >3.5 - 2 points) (gets 2 points here) (4) clinical stage (Rai 0 vs Rai I-IV - 1 point) (gets 1 point here) and (5) age (</=65 vs >65 - 1 point)  (gets 1 point here)     Low risk (0-1 points)= 93.2% OS at 5 years  Int risk (2-3 points)= 79.3%  High risk (4-6 points)= 63.3%  - this patient has 4 points already, pending IGHV testing and prior to treatment would also need to send TP53 mutation testing  Very high risk (7-10 points)= 23.3%               CLL (chronic lymphocytic leukemia) (CMS-HCC)    05/16/2016 Initial Diagnosis     CLL (chronic lymphocytic leukemia) (RAF-HCC)           Chronic lymphocytic leukemia, Rai stage IV (CMS-HCC)    09/05/2017 - 10/11/2017 Chemotherapy     Chemotherapy Treatment    Treatment Goal Control   Line of Treatment [No plan line of treatment]   Plan Name OP RITUXIMAB    Start Date 09/14/2017   End Date 10/05/2017   Provider Pernell Dupre, MD   Chemotherapy riTUXimab (RITUXAN) 712.5 mg in sodium chloride (NS) 0.9 % 500 mL IVPB, 375 mg/m2 = 712.5 mg, Intravenous, Once, 1 of 1 cycle  Administration: 712.5 mg (09/14/2017)  riTUXimab (RITUXAN) 712.5 mg in sodium chloride (NS) 0.9 % 250 mL rapid infusion, 375 mg/m2 = 712.5 mg, Intravenous, Once, 1 of 1 cycle  Administration: 712.5 mg (09/21/2017), 712.5 mg (09/28/2017), 712.5 mg (10/05/2017)            10/16/2017 Initial Diagnosis     Chronic lymphocytic leukemia, Rai stage IV (CMS-HCC)         10/23/2017 -  Chemotherapy     Chemotherapy Treatment    Treatment Goal Control   Line of Treatment [No plan line of treatment]   Plan Name OP OBINUTUZUMAB AND VENETOCLAX   Start Date 10/23/2017   End Date 03/12/2018 (Planned)   Provider Pernell Dupre, MD   Chemotherapy dexamethasone (DECADRON) tablet 20 mg, 20 mg, Oral, Once, 1 of 6 cycles  Administration: 20 mg (10/23/2017), 20 mg (10/24/2017), 20 mg (10/30/2017)  obinutuzumab 100 mg in sodium chloride (NS) 0.9 % 50 mL IVPB, 100 mg, Intravenous, Once, 1 of 6 cycles  Administration: 100 mg (10/23/2017), 900 mg (10/24/2017), 1,000 mg (10/30/2017)               Social history:  Here with son  Lives alone  Husband died in 2008-11-11      Allergies   Allergen Reactions   ??? Sertraline Other (See Comments)     Nervousness, panic, shortness of breath   ??? Topiramate Palpitations     Panic attacks; unable to sleep   ??? Baclofen      made me go crazy Current Outpatient Medications   Medication Sig Dispense Refill   ??? allopurinol (ZYLOPRIM) 300 MG tablet Take  1 tablet (300 mg total) by mouth daily. 30 tablet 2   ??? LORazepam (ATIVAN) 0.5 MG tablet Take 0.5 mg by mouth two (2) times a day as needed.      ??? mirtazapine (REMERON) 7.5 MG tablet Take 7.5 mg by mouth nightly.     ??? multivitamin (TAB-A-VITE/THERAGRAN) per tablet Take 1 tablet by mouth daily.      ??? pantoprazole (PROTONIX) 40 MG tablet Take by mouth daily at 0600.      ??? pravastatin (PRAVACHOL) 80 MG tablet TAKE 1 TABLET BY MOUTH EVERY NIGHT AT BEDTIME     ??? venetoclax (VENCLEXTA STARTING PACK) 10 mg-50 mg- 100 mg tablet Take two 10mg  tabs qday x7; one 50mg  tab qday x7; one 100mg  tab qday x7; two 100mg  tabs qday x7. (Patient not taking: Reported on 10/30/2017) 1 Package 0   ??? venetoclax (VENCLEXTA) 100 mg tablet Take 4 tablets (400 mg total) by mouth daily. Take with a meal and water. Do not chew, crush, or break tablets. 120 tablet 6     No current facility-administered medications for this visit.      Facility-Administered Medications Ordered in Other Visits   Medication Dose Route Frequency Provider Last Rate Last Dose   ??? heparin, porcine (PF) 100 unit/mL injection 500 Units  500 Units Intravenous Q30 Min PRN Pernell Dupre, MD   500 Units at 11/06/17 1030         Physical exam:  Vitals:    11/06/17 1100   BP: 145/68   Pulse: 70   Resp: 18   Temp: 36.8 ??C (98.2 ??F)   SpO2: 98%       General: in NAD, pleasant, comfortable appearing  HEENT:  EOMI. No oropharyngeal ulceration or erythema.   Lymph node exam:  No lymphadenopathy in the occipital, auricular, anterior/posterior cervical, submental, supraclavicular, inguinal regions. Stable, sub-centimeter axillary lymphadenopathy (b/l).  CV:  RRR, no murmurs, rubs, or gallops. Peripheral pulses 2+ b/l.   Resp:  CTAB, no wheezes or crackles. Comfortable on RA. No increased work of breathing.   Gastrointestinal: Soft, non-distended, non-tender to palpation. No hepatosplenomegaly. Normoactive bowel sounds.   Skin:  No rashes, petechiae or purpura.  No areas of skin breakdown.  Musculoskeletal:  FROM of extremities. No joint effusions or tenderness.   Psychiatric:  Alert and oriented to person, place, time and situation. Appropriate affect and mood.     Neurologic:  No focal deficits. CN II-XII intact. Normal gait.   Extremities:  No clubbing or cyanosis. No peripheral edema.     ECOG Performance Status: 1    Orders/Results:  Labs and pathology have been reviewed and pertinent results are as follows:    Results for orders placed or performed in visit on 11/06/17   Comprehensive Metabolic Panel   Result Value Ref Range    Sodium 139 135 - 145 mmol/L    Potassium 4.2 3.5 - 5.0 mmol/L    Chloride 109 (H) 98 - 107 mmol/L    CO2 22.0 22.0 - 30.0 mmol/L    Anion Gap 8 (L) 9 - 15 mmol/L    BUN 17 7 - 21 mg/dL    Creatinine 0.34 7.42 - 1.00 mg/dL    BUN/Creatinine Ratio 24     EGFR CKD-EPI Non-African American, Female 80 >=60 mL/min/1.39m2    EGFR CKD-EPI African American, Female >90 >=60 mL/min/1.23m2    Glucose 85 65 - 179 mg/dL    Calcium 9.1 8.5 - 59.5 mg/dL  Albumin 3.6 3.5 - 5.0 g/dL    Total Protein 6.3 (L) 6.5 - 8.3 g/dL    Total Bilirubin 0.4 0.0 - 1.2 mg/dL    AST 19 14 - 38 U/L    ALT 18 15 - 48 U/L    Alkaline Phosphatase 72 38 - 126 U/L   CBC w/ Differential   Result Value Ref Range    WBC 8.0 4.5 - 11.0 10*9/L    RBC 4.06 4.00 - 5.20 10*12/L    HGB 12.2 12.0 - 16.0 g/dL    HCT 16.1 09.6 - 04.5 %    MCV 89.4 80.0 - 100.0 fL    MCH 30.1 26.0 - 34.0 pg    MCHC 33.7 31.0 - 37.0 g/dL    RDW 40.9 (H) 81.1 - 15.0 %    MPV 9.3 7.0 - 10.0 fL    Platelet 34 (L) 150 - 440 10*9/L    Variable HGB Concentration Slight (A) Not Present    Neutrophils % 41.3 %    Lymphocytes % 47.4 %    Monocytes % 4.5 %    Eosinophils % 2.5 %    Basophils % 0.9 %    Neutrophil Left Shift 2+ (A) Not Present    Absolute Neutrophils 3.3 2.0 - 7.5 10*9/L    Absolute Lymphocytes 3.8 1.5 - 5.0 10*9/L    Absolute Monocytes 0.4 0.2 - 0.8 10*9/L    Absolute Eosinophils 0.2 0.0 - 0.4 10*9/L    Absolute Basophils 0.1 0.0 - 0.1 10*9/L    Large Unstained Cells 3 0 - 4 %    Hypochromasia Slight (A) Not Present

## 2017-11-07 NOTE — Unmapped (Signed)
Office Visit on 11/06/2017   Component Date Value Ref Range Status   ??? Uric Acid 11/06/2017 4.2  3.0 - 6.5 mg/dL Final   ??? Phosphorus 11/06/2017 3.6  2.9 - 4.7 mg/dL Final   Lab on 16/01/9603   Component Date Value Ref Range Status   ??? Sodium 11/06/2017 139  135 - 145 mmol/L Final   ??? Potassium 11/06/2017 4.2  3.5 - 5.0 mmol/L Final   ??? Chloride 11/06/2017 109* 98 - 107 mmol/L Final   ??? CO2 11/06/2017 22.0  22.0 - 30.0 mmol/L Final   ??? Anion Gap 11/06/2017 8* 9 - 15 mmol/L Final   ??? BUN 11/06/2017 17  7 - 21 mg/dL Final   ??? Creatinine 11/06/2017 0.72  0.60 - 1.00 mg/dL Final   ??? BUN/Creatinine Ratio 11/06/2017 24   Final   ??? EGFR CKD-EPI Non-African American,* 11/06/2017 80  >=60 mL/min/1.4m2 Final   ??? EGFR CKD-EPI African American, Fem* 11/06/2017 >90  >=60 mL/min/1.10m2 Final   ??? Glucose 11/06/2017 85  65 - 179 mg/dL Final   ??? Calcium 54/12/8117 9.1  8.5 - 10.2 mg/dL Final   ??? Albumin 14/78/2956 3.6  3.5 - 5.0 g/dL Final   ??? Total Protein 11/06/2017 6.3* 6.5 - 8.3 g/dL Final   ??? Total Bilirubin 11/06/2017 0.4  0.0 - 1.2 mg/dL Final   ??? AST 21/30/8657 19  14 - 38 U/L Final   ??? ALT 11/06/2017 18  15 - 48 U/L Final   ??? Alkaline Phosphatase 11/06/2017 72  38 - 126 U/L Final   ??? WBC 11/06/2017 8.0  4.5 - 11.0 10*9/L Final   ??? RBC 11/06/2017 4.06  4.00 - 5.20 10*12/L Final   ??? HGB 11/06/2017 12.2  12.0 - 16.0 g/dL Final   ??? HCT 84/69/6295 36.2  36.0 - 46.0 % Final   ??? MCV 11/06/2017 89.4  80.0 - 100.0 fL Final   ??? MCH 11/06/2017 30.1  26.0 - 34.0 pg Final   ??? MCHC 11/06/2017 33.7  31.0 - 37.0 g/dL Final   ??? RDW 28/41/3244 15.8* 12.0 - 15.0 % Final   ??? MPV 11/06/2017 9.3  7.0 - 10.0 fL Final   ??? Platelet 11/06/2017 34* 150 - 440 10*9/L Final   ??? Variable HGB Concentration 11/06/2017 Slight* Not Present Final   ??? Neutrophils % 11/06/2017 41.3  % Final   ??? Lymphocytes % 11/06/2017 47.4  % Final   ??? Monocytes % 11/06/2017 4.5  % Final   ??? Eosinophils % 11/06/2017 2.5  % Final   ??? Basophils % 11/06/2017 0.9  % Final   ??? Neutrophil Left Shift 11/06/2017 2+* Not Present Final   ??? Absolute Neutrophils 11/06/2017 3.3  2.0 - 7.5 10*9/L Final   ??? Absolute Lymphocytes 11/06/2017 3.8  1.5 - 5.0 10*9/L Final   ??? Absolute Monocytes 11/06/2017 0.4  0.2 - 0.8 10*9/L Final   ??? Absolute Eosinophils 11/06/2017 0.2  0.0 - 0.4 10*9/L Final   ??? Absolute Basophils 11/06/2017 0.1  0.0 - 0.1 10*9/L Final   ??? Large Unstained Cells 11/06/2017 3  0 - 4 % Final   ??? Hypochromasia 11/06/2017 Slight* Not Present Final   ??? Smear Review Comments 11/06/2017 See Comment* Undefined Final    Slide reviewed       If you feel like this is an emergency please call 911.  For appointments or questions Monday through Friday 8AM-5PM please call [phone] or Toll Free [phone]. For Medical questions or concerns  ask for the Nurse Triage Line.  On Nights, Weekends, and Holidays call [phone] and ask for the Oncologist on Call.  Reasons to call the Nurse Triage Line:  Fever of 100.5 or greater  Nausea and/or vomiting not relived with nausea medicine  Diarrhea or constipation  Severe pain not relieved with usual pain regimen  Shortness of breath  Uncontrolled bleeding  Mental status changes

## 2017-11-07 NOTE — Unmapped (Signed)
Pt here for Obinutuzumab IV; pt tolerated well. Port de-accessed per protocol. Pt discharged from clinic stable and ambulatory; accompanied by her companion.

## 2017-11-09 ENCOUNTER — Ambulatory Visit: Admit: 2017-11-09 | Discharge: 2017-11-10 | Payer: MEDICARE

## 2017-11-09 ENCOUNTER — Other Ambulatory Visit: Admit: 2017-11-09 | Discharge: 2017-11-10 | Payer: MEDICARE

## 2017-11-09 DIAGNOSIS — C9110 Chronic lymphocytic leukemia of B-cell type not having achieved remission: Secondary | ICD-10-CM

## 2017-11-09 LAB — CBC W/ AUTO DIFF
BASOPHILS ABSOLUTE COUNT: 0.1 10*9/L (ref 0.0–0.1)
BASOPHILS RELATIVE PERCENT: 1 %
EOSINOPHILS RELATIVE PERCENT: 1.2 %
HEMATOCRIT: 38 % (ref 36.0–46.0)
HEMOGLOBIN: 12.8 g/dL (ref 12.0–16.0)
LARGE UNSTAINED CELLS: 5 % — ABNORMAL HIGH (ref 0–4)
LYMPHOCYTES ABSOLUTE COUNT: 4.7 10*9/L (ref 1.5–5.0)
LYMPHOCYTES RELATIVE PERCENT: 53.2 %
MEAN CORPUSCULAR HEMOGLOBIN CONC: 33.6 g/dL (ref 31.0–37.0)
MEAN CORPUSCULAR HEMOGLOBIN: 30 pg (ref 26.0–34.0)
MEAN CORPUSCULAR VOLUME: 89.5 fL (ref 80.0–100.0)
MEAN PLATELET VOLUME: 9.2 fL (ref 7.0–10.0)
MONOCYTES ABSOLUTE COUNT: 0.4 10*9/L (ref 0.2–0.8)
MONOCYTES RELATIVE PERCENT: 4.3 %
NEUTROPHILS RELATIVE PERCENT: 35.8 %
PLATELET COUNT: 47 10*9/L — ABNORMAL LOW (ref 150–440)
RED BLOOD CELL COUNT: 4.25 10*12/L (ref 4.00–5.20)
WBC ADJUSTED: 8.9 10*9/L (ref 4.5–11.0)

## 2017-11-09 LAB — SMEAR REVIEW: Lab: 0

## 2017-11-09 LAB — NEUTROPHIL LEFT SHIFT

## 2017-11-09 NOTE — Unmapped (Signed)
If you feel like this is an emergency please call 911.  For appointments or questions Monday through Friday 8AM-5PM please call [phone] or Toll Free [phone]. For Medical questions or concerns ask for the Nurse Triage Line.  On Nights, Weekends, and Holidays call [phone] and ask for the Oncologist on Call.  Reasons to call the Nurse Triage Line:  Fever of 100.5 or greater  Nausea and/or vomiting not relived with nausea medicine  Diarrhea or constipation  Severe pain not relieved with usual pain regimen  Shortness of breath  Uncontrolled bleeding  Mental status changes    Lab on 11/09/2017   Component Date Value Ref Range Status   ??? ABO Grouping 11/09/2017 B NEG   Final   ??? Antibody Screen 11/09/2017 NEG   Final   ??? WBC 11/09/2017 8.9  4.5 - 11.0 10*9/L Final   ??? RBC 11/09/2017 4.25  4.00 - 5.20 10*12/L Final   ??? HGB 11/09/2017 12.8  12.0 - 16.0 g/dL Final   ??? HCT 57/84/6962 38.0  36.0 - 46.0 % Final   ??? MCV 11/09/2017 89.5  80.0 - 100.0 fL Final   ??? MCH 11/09/2017 30.0  26.0 - 34.0 pg Final   ??? MCHC 11/09/2017 33.6  31.0 - 37.0 g/dL Final   ??? RDW 95/28/4132 16.5* 12.0 - 15.0 % Final   ??? MPV 11/09/2017 9.2  7.0 - 10.0 fL Final   ??? Platelet 11/09/2017 47* 150 - 440 10*9/L Final   ??? Variable HGB Concentration 11/09/2017 Slight* Not Present Final   ??? Neutrophils % 11/09/2017 35.8  % Final   ??? Lymphocytes % 11/09/2017 53.2  % Final   ??? Monocytes % 11/09/2017 4.3  % Final   ??? Eosinophils % 11/09/2017 1.2  % Final   ??? Basophils % 11/09/2017 1.0  % Final   ??? Neutrophil Left Shift 11/09/2017 3+* Not Present Final   ??? Absolute Neutrophils 11/09/2017 3.2  2.0 - 7.5 10*9/L Final   ??? Absolute Lymphocytes 11/09/2017 4.7  1.5 - 5.0 10*9/L Final   ??? Absolute Monocytes 11/09/2017 0.4  0.2 - 0.8 10*9/L Final   ??? Absolute Eosinophils 11/09/2017 0.1  0.0 - 0.4 10*9/L Final   ??? Absolute Basophils 11/09/2017 0.1  0.0 - 0.1 10*9/L Final   ??? Large Unstained Cells 11/09/2017 5* 0 - 4 % Final   ??? Anisocytosis 11/09/2017 Slight* Not Present Final   ??? Hypochromasia 11/09/2017 Slight* Not Present Final

## 2017-11-09 NOTE — Unmapped (Signed)
1030:  Labs drawn and sent for analysis.  Care provided by  Adolph Pollack, RN

## 2017-11-09 NOTE — Unmapped (Signed)
Pt presents for labs check and possible blood product transfusion. H/H 12.8/38.0, plt 47. No transfusion indicated today. Pt is awake and alert, NAD, VSS. Port de-accessed. AVS given and reviewed. Pt denies any questions or concerns; discharged ambulatory.

## 2017-11-13 ENCOUNTER — Ambulatory Visit
Admit: 2017-11-13 | Discharge: 2017-11-13 | Payer: MEDICARE | Attending: Hematology & Oncology | Primary: Hematology & Oncology

## 2017-11-13 ENCOUNTER — Ambulatory Visit: Admit: 2017-11-13 | Discharge: 2017-11-13 | Payer: MEDICARE

## 2017-11-13 ENCOUNTER — Other Ambulatory Visit: Admit: 2017-11-13 | Discharge: 2017-11-13 | Payer: MEDICARE

## 2017-11-13 DIAGNOSIS — R490 Dysphonia: Principal | ICD-10-CM

## 2017-11-13 DIAGNOSIS — C9110 Chronic lymphocytic leukemia of B-cell type not having achieved remission: Secondary | ICD-10-CM

## 2017-11-13 DIAGNOSIS — D591 Other autoimmune hemolytic anemias: Secondary | ICD-10-CM

## 2017-11-13 LAB — CBC W/ AUTO DIFF
BASOPHILS ABSOLUTE COUNT: 0 10*9/L (ref 0.0–0.1)
BASOPHILS ABSOLUTE COUNT: 0 10*9/L (ref 0.0–0.1)
BASOPHILS RELATIVE PERCENT: 0.6 %
EOSINOPHILS ABSOLUTE COUNT: 0.2 10*9/L (ref 0.0–0.4)
EOSINOPHILS ABSOLUTE COUNT: 0.1 10*9/L (ref 0.0–0.4)
EOSINOPHILS RELATIVE PERCENT: 2.2 %
EOSINOPHILS RELATIVE PERCENT: 2.8 %
HEMATOCRIT: 34.7 % — ABNORMAL LOW (ref 36.0–46.0)
HEMATOCRIT: 32.4 % — ABNORMAL LOW (ref 36.0–46.0)
HEMOGLOBIN: 11.6 g/dL — ABNORMAL LOW (ref 12.0–16.0)
HEMOGLOBIN: 11 g/dL — ABNORMAL LOW (ref 12.0–16.0)
LARGE UNSTAINED CELLS: 3 % (ref 0–4)
LYMPHOCYTES ABSOLUTE COUNT: 3.2 10*9/L (ref 1.5–5.0)
LYMPHOCYTES RELATIVE PERCENT: 45.9 %
LYMPHOCYTES RELATIVE PERCENT: 43.1 %
MEAN CORPUSCULAR HEMOGLOBIN CONC: 33.6 g/dL (ref 31.0–37.0)
MEAN CORPUSCULAR HEMOGLOBIN CONC: 34 g/dL (ref 31.0–37.0)
MEAN CORPUSCULAR HEMOGLOBIN: 29.9 pg (ref 26.0–34.0)
MEAN CORPUSCULAR HEMOGLOBIN: 30.2 pg (ref 26.0–34.0)
MEAN CORPUSCULAR VOLUME: 88.9 fL (ref 80.0–100.0)
MEAN CORPUSCULAR VOLUME: 88.8 fL (ref 80.0–100.0)
MEAN PLATELET VOLUME: 9.1 fL (ref 7.0–10.0)
MONOCYTES ABSOLUTE COUNT: 0.5 10*9/L (ref 0.2–0.8)
MONOCYTES ABSOLUTE COUNT: 0.2 10*9/L (ref 0.2–0.8)
MONOCYTES RELATIVE PERCENT: 7 %
MONOCYTES RELATIVE PERCENT: 4.9 %
NEUTROPHILS ABSOLUTE COUNT: 2.7 10*9/L (ref 2.0–7.5)
NEUTROPHILS ABSOLUTE COUNT: 2.2 10*9/L (ref 2.0–7.5)
NEUTROPHILS RELATIVE PERCENT: 40 %
NEUTROPHILS RELATIVE PERCENT: 45.8 %
PLATELET COUNT: 36 10*9/L — ABNORMAL LOW (ref 150–440)
PLATELET COUNT: 46 10*9/L — ABNORMAL LOW (ref 150–440)
RED BLOOD CELL COUNT: 3.9 10*12/L — ABNORMAL LOW (ref 4.00–5.20)
RED BLOOD CELL COUNT: 3.64 10*12/L — ABNORMAL LOW (ref 4.00–5.20)
RED CELL DISTRIBUTION WIDTH: 16.3 % — ABNORMAL HIGH (ref 12.0–15.0)
RED CELL DISTRIBUTION WIDTH: 16.5 % — ABNORMAL HIGH (ref 12.0–15.0)
WBC ADJUSTED: 6.9 10*9/L (ref 4.5–11.0)
WBC ADJUSTED: 4.8 10*9/L (ref 4.5–11.0)

## 2017-11-13 LAB — PHOSPHORUS
Phosphate:MCnc:Pt:Ser/Plas:Qn:: 4.1
Phosphate:MCnc:Pt:Ser/Plas:Qn:: 4.8 — ABNORMAL HIGH

## 2017-11-13 LAB — COMPREHENSIVE METABOLIC PANEL
ALBUMIN: 2.9 g/dL — ABNORMAL LOW (ref 3.5–5.0)
ALKALINE PHOSPHATASE: 97 U/L (ref 38–126)
ALKALINE PHOSPHATASE: 79 U/L (ref 38–126)
ALT (SGPT): 21 U/L (ref 15–48)
ALT (SGPT): 21 U/L (ref 15–48)
ANION GAP: 6 mmol/L — ABNORMAL LOW (ref 9–15)
ANION GAP: 2 mmol/L — ABNORMAL LOW (ref 9–15)
AST (SGOT): 21 U/L (ref 14–38)
AST (SGOT): 18 U/L (ref 14–38)
BILIRUBIN TOTAL: 0.6 mg/dL (ref 0.0–1.2)
BILIRUBIN TOTAL: 0.4 mg/dL (ref 0.0–1.2)
BLOOD UREA NITROGEN: 24 mg/dL — ABNORMAL HIGH (ref 7–21)
BLOOD UREA NITROGEN: 21 mg/dL (ref 7–21)
BUN / CREAT RATIO: 23
BUN / CREAT RATIO: 22
CALCIUM: 8.4 mg/dL — ABNORMAL LOW (ref 8.5–10.2)
CHLORIDE: 109 mmol/L — ABNORMAL HIGH (ref 98–107)
CO2: 22 mmol/L (ref 22.0–30.0)
CO2: 25 mmol/L (ref 22.0–30.0)
CREATININE: 0.96 mg/dL (ref 0.60–1.00)
EGFR CKD-EPI AA FEMALE: 58 mL/min/{1.73_m2} — ABNORMAL LOW (ref >=60)
EGFR CKD-EPI AA FEMALE: 65 mL/min/{1.73_m2} (ref >=60)
EGFR CKD-EPI NON-AA FEMALE: 51 mL/min/{1.73_m2} — ABNORMAL LOW (ref >=60)
EGFR CKD-EPI NON-AA FEMALE: 56 mL/min/{1.73_m2} — ABNORMAL LOW (ref >=60)
GLUCOSE RANDOM: 146 mg/dL (ref 65–179)
GLUCOSE RANDOM: 113 mg/dL (ref 65–179)
POTASSIUM: 4 mmol/L (ref 3.5–5.0)
POTASSIUM: 4.1 mmol/L (ref 3.5–5.0)
PROTEIN TOTAL: 6.2 g/dL — ABNORMAL LOW (ref 6.5–8.3)
PROTEIN TOTAL: 5.4 g/dL — ABNORMAL LOW (ref 6.5–8.3)
SODIUM: 137 mmol/L (ref 135–145)
SODIUM: 137 mmol/L (ref 135–145)

## 2017-11-13 LAB — URIC ACID
Urate:MCnc:Pt:Ser/Plas:Qn:: 3.7
Urate:MCnc:Pt:Ser/Plas:Qn:: 3.7

## 2017-11-13 LAB — LACTATE DEHYDROGENASE
Lactate dehydrogenase:CCnc:Pt:Ser/Plas:Qn:: 385
Lactate dehydrogenase:CCnc:Pt:Ser/Plas:Qn:: 511

## 2017-11-13 LAB — SMEAR REVIEW

## 2017-11-13 LAB — PROTEIN TOTAL: Protein:MCnc:Pt:Ser/Plas:Qn:: 5.4 — ABNORMAL LOW

## 2017-11-13 LAB — NEUTROPHILS RELATIVE PERCENT: Lab: 45.8

## 2017-11-13 LAB — AST (SGOT): Aspartate aminotransferase:CCnc:Pt:Ser/Plas:Qn:: 21

## 2017-11-13 LAB — WBC ADJUSTED: Lab: 6.9

## 2017-11-13 MED ORDER — SEVELAMER HCL 800 MG TABLET
ORAL_TABLET | Freq: Three times a day (TID) | ORAL | 11 refills | 0.00000 days | Status: CP
Start: 2017-11-13 — End: 2017-11-27

## 2017-11-13 MED ORDER — SEVELAMER HCL 800 MG TABLET: 800 mg | tablet | Freq: Three times a day (TID) | 11 refills | 0 days | Status: AC

## 2017-11-13 NOTE — Unmapped (Addendum)
1. You might be a tiny bit dehydrated - we will give you IV fluids and at home, I would drink 6 bottles of water (or other liquids) a day.  2. Your phosphorus is a tiny bit elevated - I am prescribing you a pill to help bring this down (sevelamer).  3. You will take the pill on the third floor today, and then tomorrow you will be instructed by [facility] when it is okay to take the pill, based on your labs tomorrow morning  4. I will also be checking your labs 6-8 hours after your pill today, in order to make sure it's safe for you to go home. If the labs are off in any major way, we admit you to the hospital.  5. I put in a request for you to see ENT to see what is going on with your hoarse voice -hopefully they can fit you in soon.    Please return tomorrow for a follow up visit and labs.    Please call Katrina Gould if you experience: ??  1. Nausea or vomiting not controlled by nausea medicines  2. Fever of 100.5 F or higher, shaking chills, drenching night sweats.  3. Uncontrolled pain  4. Any rapidly enlarging lymph node or mass  5. Unintentional weight loss  6. Any other concerning symptom     MyChart Messages  For your safety and best care, please DO NOT use MyChart messages to report symptoms. (Symptoms should be reported by calling the nurse triage line). Please use MyChart for non-urgent matters such as general questions, non-urgent prescription refills, or non-urgent scheduling issues.     ?? Please do not use MyChart for URGENT messages, as messages are only checked during regular business hours.     ?? Please note that MyChart messages may be routed a central pool and one of your provider???s team members will get back to you.  - Expect up to 3 business days for response     If you have any other questions, please do not hesitate to contact Katrina Gould.    Nurse Navigator: Frankey Poot, RN ??    For health related questions Monday through Friday 8 AM??? 5 PM : please call the office at [phone] and ask for Frankey Poot, RN, our nurse navigator.   For appointment changes call: Main Clinic [phone].  Toll free number is [phone].    On Nights, Weekends and Holidays:  Call [phone] and ask for the adult hematologist/oncologist on call.      [facility]  [address]  [place], [place] [address]  [email or web address]    Results for orders placed or performed in visit on 11/13/17   Comprehensive Metabolic Panel   Result Value Ref Range    Sodium 137 135 - 145 mmol/L    Potassium 4.0 3.5 - 5.0 mmol/L    Chloride 109 (H) 98 - 107 mmol/L    CO2 22.0 22.0 - 30.0 mmol/L    Anion Gap 6 (L) 9 - 15 mmol/L    BUN 24 (H) 7 - 21 mg/dL    Creatinine 4.25 (H) 0.60 - 1.00 mg/dL    BUN/Creatinine Ratio 23     EGFR CKD-EPI Non-African American, Female 51 (L) >=60 mL/min/1.2m2    EGFR CKD-EPI African American, Female 58 (L) >=60 mL/min/1.61m2    Glucose 146 65 - 179 mg/dL    Calcium 9.0 8.5 - 95.6 mg/dL    Albumin 3.5 3.5 - 5.0 g/dL    Total  Protein 6.2 (L) 6.5 - 8.3 g/dL    Total Bilirubin 0.6 0.0 - 1.2 mg/dL    AST 21 14 - 38 U/L    ALT 21 15 - 48 U/L    Alkaline Phosphatase 97 38 - 126 U/L   Uric acid   Result Value Ref Range    Uric Acid 3.7 3.0 - 6.5 mg/dL   Lactate dehydrogenase   Result Value Ref Range    LDH 511 338 - 610 U/L   Phosphorus Level   Result Value Ref Range    Phosphorus 4.8 (H) 2.9 - 4.7 mg/dL   Type and Screen   Result Value Ref Range    ABO Grouping B NEG     Antibody Screen NEG    CBC w/ Differential   Result Value Ref Range    WBC 6.9 4.5 - 11.0 10*9/L    RBC 3.90 (L) 4.00 - 5.20 10*12/L    HGB 11.6 (L) 12.0 - 16.0 g/dL    HCT 16.1 (L) 09.6 - 46.0 %    MCV 88.9 80.0 - 100.0 fL    MCH 29.9 26.0 - 34.0 pg    MCHC 33.6 31.0 - 37.0 g/dL    RDW 04.5 (H) 40.9 - 15.0 %    MPV 9.2 7.0 - 10.0 fL    Platelet 36 (L) 150 - 440 10*9/L    Variable HGB Concentration Slight (A) Not Present    Neutrophils % 40.0 %    Lymphocytes % 45.9 %    Monocytes % 7.0 %    Eosinophils % 2.2 %    Basophils % 0.6 %    Neutrophil Left Shift 1+ (A) Not Present    Absolute Neutrophils 2.7 2.0 - 7.5 10*9/L    Absolute Lymphocytes 3.2 1.5 - 5.0 10*9/L    Absolute Monocytes 0.5 0.2 - 0.8 10*9/L    Absolute Eosinophils 0.2 0.0 - 0.4 10*9/L    Absolute Basophils 0.0 0.0 - 0.1 10*9/L    Large Unstained Cells 4 0 - 4 %    Anisocytosis Slight (A) Not Present    Hypochromasia Slight (A) Not Present

## 2017-11-13 NOTE — Unmapped (Signed)
Pt is here in clinic this am for first day of Venetoclax. Pt in NAD, accompanied by family.   Port was accessed prior to arrival to clinic; +BR confirmed.   Pt took 20 mg of Venetoclax at 1000.   NS IVF started per orders; completed at 1500.   Blood samples collected and sent to lab at 1613.     Blood results are back.   Dr. Lonni Fix instructs to discharge patient home today.   1654: Port flushed, heparinized, de-accessed per protocol.   AVS handed to patient.   Pt discharged home in NAD, ambulatory, accompanied by son.

## 2017-11-13 NOTE — Unmapped (Signed)
1610:  Labs drawn and sent for analysis.  Care provided by  Eppie Gibson, RN

## 2017-11-13 NOTE — Unmapped (Signed)
Pt presents to infusion for lab check and possible blood products. H/H 11.6/34.7; plt 36. No blood product transfusion needed today.     Received call from Dr. Lonni Fix, requesting 5 hours IVF (see orders in eMAR). Per Dr. Lonni Fix, pt is also to take 20mg  Venetoclax via home med, and labs in Therapy Plan A to be collected 6-8 hours following Venetoclax. Pt is not to be discharged until labs have resulted.

## 2017-11-13 NOTE — Unmapped (Addendum)
If you feel like this is an emergency please call 911.  For appointments or questions Monday through Friday 8AM-5PM please call [phone] or Toll Free [phone]. For Medical questions or concerns ask for the Nurse Triage Line.  On Nights, Weekends, and Holidays call [phone] and ask for the Oncologist on Call.  Reasons to call the Nurse Triage Line:  Fever of 100.5 or greater  Nausea and/or vomiting not relived with nausea medicine  Diarrhea or constipation  Severe pain not relieved with usual pain regimen  Shortness of breath  Uncontrolled bleeding  Mental status changes    Lab on 11/13/2017   Component Date Value Ref Range Status   ??? ABO Grouping 11/13/2017 B NEG   Final   ??? Antibody Screen 11/13/2017 NEG   Final   ??? Sodium 11/13/2017 137  135 - 145 mmol/L Final   ??? Potassium 11/13/2017 4.0  3.5 - 5.0 mmol/L Final   ??? Chloride 11/13/2017 109* 98 - 107 mmol/L Final   ??? CO2 11/13/2017 22.0  22.0 - 30.0 mmol/L Final   ??? Anion Gap 11/13/2017 6* 9 - 15 mmol/L Final   ??? BUN 11/13/2017 24* 7 - 21 mg/dL Final   ??? Creatinine 11/13/2017 1.05* 0.60 - 1.00 mg/dL Final   ??? BUN/Creatinine Ratio 11/13/2017 23   Final   ??? EGFR CKD-EPI Non-African American,* 11/13/2017 51* >=60 mL/min/1.61m2 Final   ??? EGFR CKD-EPI African American, Fem* 11/13/2017 58* >=60 mL/min/1.5m2 Final   ??? Glucose 11/13/2017 146  65 - 179 mg/dL Final   ??? Calcium 57/84/6962 9.0  8.5 - 10.2 mg/dL Final   ??? Albumin 95/28/4132 3.5  3.5 - 5.0 g/dL Final   ??? Total Protein 11/13/2017 6.2* 6.5 - 8.3 g/dL Final   ??? Total Bilirubin 11/13/2017 0.6  0.0 - 1.2 mg/dL Final   ??? AST 44/05/270 21  14 - 38 U/L Final   ??? ALT 11/13/2017 21  15 - 48 U/L Final   ??? Alkaline Phosphatase 11/13/2017 97  38 - 126 U/L Final   ??? Uric Acid 11/13/2017 3.7  3.0 - 6.5 mg/dL Final   ??? LDH 53/66/4403 511  338 - 610 U/L Final   ??? Phosphorus 11/13/2017 4.8* 2.9 - 4.7 mg/dL Final   ??? WBC 47/42/5956 6.9  4.5 - 11.0 10*9/L Final   ??? RBC 11/13/2017 3.90* 4.00 - 5.20 10*12/L Final   ??? HGB 11/13/2017 11.6* 12.0 - 16.0 g/dL Final   ??? HCT 38/75/6433 34.7* 36.0 - 46.0 % Final   ??? MCV 11/13/2017 88.9  80.0 - 100.0 fL Final   ??? MCH 11/13/2017 29.9  26.0 - 34.0 pg Final   ??? MCHC 11/13/2017 33.6  31.0 - 37.0 g/dL Final   ??? RDW 29/51/8841 16.3* 12.0 - 15.0 % Final   ??? MPV 11/13/2017 9.2  7.0 - 10.0 fL Final   ??? Platelet 11/13/2017 36* 150 - 440 10*9/L Final   ??? Variable HGB Concentration 11/13/2017 Slight* Not Present Final   ??? Neutrophils % 11/13/2017 40.0  % Final   ??? Lymphocytes % 11/13/2017 45.9  % Final   ??? Monocytes % 11/13/2017 7.0  % Final   ??? Eosinophils % 11/13/2017 2.2  % Final   ??? Basophils % 11/13/2017 0.6  % Final   ??? Neutrophil Left Shift 11/13/2017 1+* Not Present Final   ??? Absolute Neutrophils 11/13/2017 2.7  2.0 - 7.5 10*9/L Final   ??? Absolute Lymphocytes 11/13/2017 3.2  1.5 - 5.0 10*9/L Final   ???  Absolute Monocytes 11/13/2017 0.5  0.2 - 0.8 10*9/L Final   ??? Absolute Eosinophils 11/13/2017 0.2  0.0 - 0.4 10*9/L Final   ??? Absolute Basophils 11/13/2017 0.0  0.0 - 0.1 10*9/L Final   ??? Large Unstained Cells 11/13/2017 4  0 - 4 % Final   ??? Anisocytosis 11/13/2017 Slight* Not Present Final   ??? Hypochromasia 11/13/2017 Slight* Not Present Final   ??? Smear Review Comments 11/13/2017 See Comment* Undefined Final    Slide reviewed  Smudge Cells present.     Hospital Outpatient Visit on 11/13/2017   Component Date Value Ref Range Status   ??? Uric Acid 11/13/2017 3.7  3.0 - 6.5 mg/dL Final   ??? Phosphorus 11/13/2017 4.1  2.9 - 4.7 mg/dL Final   ??? LDH 16/01/9603 385  338 - 610 U/L Final   ??? Sodium 11/13/2017 137  135 - 145 mmol/L Final   ??? Potassium 11/13/2017 4.1  3.5 - 5.0 mmol/L Final   ??? Chloride 11/13/2017 110* 98 - 107 mmol/L Final   ??? CO2 11/13/2017 25.0  22.0 - 30.0 mmol/L Final   ??? Anion Gap 11/13/2017 2* 9 - 15 mmol/L Final   ??? BUN 11/13/2017 21  7 - 21 mg/dL Final   ??? Creatinine 11/13/2017 0.96  0.60 - 1.00 mg/dL Final   ??? BUN/Creatinine Ratio 11/13/2017 22   Final   ??? EGFR CKD-EPI Non-African American,* 11/13/2017 56* >=60 mL/min/1.76m2 Final   ??? EGFR CKD-EPI African American, Fem* 11/13/2017 65  >=60 mL/min/1.31m2 Final   ??? Glucose 11/13/2017 113  65 - 179 mg/dL Final   ??? Calcium 54/12/8117 8.4* 8.5 - 10.2 mg/dL Final   ??? Albumin 14/78/2956 2.9* 3.5 - 5.0 g/dL Final   ??? Total Protein 11/13/2017 5.4* 6.5 - 8.3 g/dL Final   ??? Total Bilirubin 11/13/2017 0.4  0.0 - 1.2 mg/dL Final   ??? AST 21/30/8657 18  14 - 38 U/L Final   ??? ALT 11/13/2017 21  15 - 48 U/L Final   ??? Alkaline Phosphatase 11/13/2017 79  38 - 126 U/L Final   ??? WBC 11/13/2017 4.8  4.5 - 11.0 10*9/L Final   ??? RBC 11/13/2017 3.64* 4.00 - 5.20 10*12/L Final   ??? HGB 11/13/2017 11.0* 12.0 - 16.0 g/dL Final   ??? HCT 84/69/6295 32.4* 36.0 - 46.0 % Final   ??? MCV 11/13/2017 88.8  80.0 - 100.0 fL Final   ??? MCH 11/13/2017 30.2  26.0 - 34.0 pg Final   ??? MCHC 11/13/2017 34.0  31.0 - 37.0 g/dL Final   ??? RDW 28/41/3244 16.5* 12.0 - 15.0 % Final   ??? MPV 11/13/2017 9.1  7.0 - 10.0 fL Final   ??? Platelet 11/13/2017 46* 150 - 440 10*9/L Final   ??? Variable HGB Concentration 11/13/2017 Slight* Not Present Final   ??? Neutrophils % 11/13/2017 45.8  % Final   ??? Lymphocytes % 11/13/2017 43.1  % Final   ??? Monocytes % 11/13/2017 4.9  % Final   ??? Eosinophils % 11/13/2017 2.8  % Final   ??? Basophils % 11/13/2017 0.4  % Final   ??? Neutrophil Left Shift 11/13/2017 1+* Not Present Final   ??? Absolute Neutrophils 11/13/2017 2.2  2.0 - 7.5 10*9/L Final   ??? Absolute Lymphocytes 11/13/2017 2.1  1.5 - 5.0 10*9/L Final   ??? Absolute Monocytes 11/13/2017 0.2  0.2 - 0.8 10*9/L Final   ??? Absolute Eosinophils 11/13/2017 0.1  0.0 - 0.4 10*9/L Final   ??? Absolute  Basophils 11/13/2017 0.0  0.0 - 0.1 10*9/L Final   ??? Large Unstained Cells 11/13/2017 3  0 - 4 % Final   ??? Anisocytosis 11/13/2017 Slight* Not Present Final   ??? Hypochromasia 11/13/2017 Slight* Not Present Final

## 2017-11-13 NOTE — Unmapped (Signed)
[facility] Follow Up       Patient Name: Katrina Gould  Patient Age: 77 y.o.  Encounter Date: 11/13/2017      Chief complaint/Reason for visit: CLL/SLL      Assessment:  Katrina Gould is a 77 y.o. female who presents for follow of her CLL/SLL, Rai stage 4. Disease markers: 46,XX,der(4)t(4;4)(p14;q21). IGHV unmutated (0%, 3-33*01). B45m 5.05. No TP53 mutation. High-risk by CLL-IPI (6 points).    Katrina Gould is doing fair though with a number of minor issues today. Mild AKI likely due to inadequate volume intake in setting of heat and insensible losses.    Plans and Recommendations:  1. CLL, Rai 4   - ven/obi C1D22 today; venetoclax 20 mg starts today  - RTC tomorrow for 24 hr lab f/u  - pt aware not to take ven till labs return and gets okay from [place] tomorrow  - plts now consistently above 20 so can stop twice weekly checks (will be coming weekly next 5 weeks)   - cont allopurinol and add sevelamer 800 mg TID given phos 4.8    2. AKI  - very mild, and creat ok to proceed with ven 20 mg today as pt low risk for TLS  - 1 L Normal saline and monitor frequently as per ven ramp up    3. Health Maintenance  - received Influenza vaccination this past season  - continue annual dermatology follow up  - still needs 2nd shingrix vaccine    4. Hoarseness  - did not improve with abx  - ENT consult for further eval    5. Misc  - pt wishes to see a podiatrist and I said this is fine  - pt with questions on parking cost - asked for social work to speak with pt  - filled out insurance form at visit    6. Mid back pain  - exam reassuring and no focal s/s  - conservative mgmt    Due to this patient's diagnosis, she is at significant risk for subsequent morbidity and/or mortality.    Spent 40 min with pt, over 50% in counseling      Interval History:  Patient is doing fair. Today complains of ongoing hoarseness despite antibiotic course. Notes mid back pain - sleeping on floor some nights. Still with intermittent dizziness - chronic issue though pt feels worse the past week. Says it feels like she is drunk in that she staggers.    Otherwise, she denies new constitutional symptoms such as anorexia, weight loss, drenching night sweats or unexplained fevers.  Furthermore, she denies symptoms of marrow failure:  recurrent or unexplained intercurrent infections.  There have been no new or unexplained pains or self-identified masses, swelling or enlarged lymph nodes.    Past Medical, Surgical and Family History were reviewed and pertinent updates were made in the Electronic Medical Record      REVIEW OF SYSTEMS: 12 point review of systems negative unless indicated in interval history.      Oncology History:    Oncology History    CLL    First seen in oncology clinic locally 03/31/16    Had a left axillary biopsy 03/15/16 that confirmed CLL/SLL: Flow showed monoclonal B-cell population with CD5, CD23, kappa light chain. IHC showed positive CD20 (diffuse), negative for cyclin D1.    No constitutional symptoms except some sweats    Labs: Hb 12.0, Hct 34.8, plt 147. WBC was 10.6 with ANC 2.5. ALC was  7.5K.    CLL FISH studies: normal panel. No cells with 11q, tri 12, 17p or 13q, and the 11;14 translocation also not detected.    CT c/a/p 04/04/16 showed mild LAD in chest, abd, and pelvis and spleen was at ULN (13 x 5.6 x 10.9 cm). Largest node 1.6 cm.    Of note is that she had a prior CT on 04/06/15 that showed similarly small enlarged node, largest being 1.9 cm.    CLL-IPI: As per Lancet Oncol Vol 15 October 2014  Includes (1) TP53 status (no abnormalities vs. Del 17p and/or TP53 mutation - 4 points) - no 17p (did not send TP53 mutation testing), (2) IGHV mutation status (mut vs unmut - 2 points) (pending) (3) serum B2-microglobulin (</=3.5 vs >3.5 - 2 points) (gets 2 points here) (4) clinical stage (Rai 0 vs Rai I-IV - 1 point) (gets 1 point here) and (5) age (</=65 vs >65 - 1 point)  (gets 1 point here)     Low risk (0-1 points)= 93.2% OS at 5 years  Int risk (2-3 points)= 79.3%  High risk (4-6 points)= 63.3%  - this patient has 4 points already, pending IGHV testing and prior to treatment would also need to send TP53 mutation testing  Very high risk (7-10 points)= 23.3%               CLL (chronic lymphocytic leukemia) (CMS-HCC)    05/16/2016 Initial Diagnosis     CLL (chronic lymphocytic leukemia) (RAF-HCC)           Chronic lymphocytic leukemia, Rai stage IV (CMS-HCC)    09/05/2017 - 10/11/2017 Chemotherapy     Chemotherapy Treatment    Treatment Goal Control   Line of Treatment [No plan line of treatment]   Plan Name OP RITUXIMAB    Start Date 09/14/2017   End Date 10/05/2017   Provider Pernell Dupre, MD   Chemotherapy riTUXimab (RITUXAN) 712.5 mg in sodium chloride (NS) 0.9 % 500 mL IVPB, 375 mg/m2 = 712.5 mg, Intravenous, Once, 1 of 1 cycle  Administration: 712.5 mg (09/14/2017)  riTUXimab (RITUXAN) 712.5 mg in sodium chloride (NS) 0.9 % 250 mL rapid infusion, 375 mg/m2 = 712.5 mg, Intravenous, Once, 1 of 1 cycle  Administration: 712.5 mg (09/21/2017), 712.5 mg (09/28/2017), 712.5 mg (10/05/2017)            10/16/2017 Initial Diagnosis     Chronic lymphocytic leukemia, Rai stage IV (CMS-HCC)         10/23/2017 -  Chemotherapy     Chemotherapy Treatment    Treatment Goal Control   Line of Treatment [No plan line of treatment]   Plan Name OP OBINUTUZUMAB AND VENETOCLAX   Start Date 10/23/2017   End Date 03/12/2018 (Planned)   Provider Pernell Dupre, MD   Chemotherapy dexamethasone (DECADRON) tablet 20 mg, 20 mg, Oral, Once, 1 of 6 cycles  Administration: 20 mg (10/23/2017), 20 mg (10/24/2017), 20 mg (10/30/2017), 20 mg (11/06/2017)  obinutuzumab 100 mg in sodium chloride (NS) 0.9 % 50 mL IVPB, 100 mg, Intravenous, Once, 1 of 6 cycles  Administration: 100 mg (10/23/2017), 900 mg (10/24/2017), 1,000 mg (10/30/2017), 1,000 mg (11/06/2017)               Social history:  Here with son  Lives alone  Husband died in 11/19/2008      Allergies   Allergen Reactions   ??? Sertraline Other (See Comments)     Nervousness, panic, shortness of breath   ???  Topiramate Palpitations     Panic attacks; unable to sleep   ??? Baclofen      made me go crazy         Current Outpatient Medications   Medication Sig Dispense Refill   ??? allopurinol (ZYLOPRIM) 300 MG tablet Take 1 tablet (300 mg total) by mouth daily. 30 tablet 2   ??? levoFLOXacin (LEVAQUIN) 750 MG tablet Take 1 tablet (750 mg total) by mouth daily. for 10 days 10 tablet 0   ??? LORazepam (ATIVAN) 0.5 MG tablet Take 0.5 mg by mouth two (2) times a day as needed.      ??? mirtazapine (REMERON) 7.5 MG tablet Take 7.5 mg by mouth nightly.     ??? multivitamin (TAB-A-VITE/THERAGRAN) per tablet Take 1 tablet by mouth daily.      ??? pantoprazole (PROTONIX) 40 MG tablet Take by mouth daily at 0600.      ??? pravastatin (PRAVACHOL) 80 MG tablet TAKE 1 TABLET BY MOUTH EVERY NIGHT AT BEDTIME     ??? venetoclax (VENCLEXTA STARTING PACK) 10 mg-50 mg- 100 mg tablet Take two 10mg  tabs qday x7; one 50mg  tab qday x7; one 100mg  tab qday x7; two 100mg  tabs qday x7. 1 Package 0   ??? venetoclax (VENCLEXTA) 100 mg tablet Take 4 tablets (400 mg total) by mouth daily. Take with a meal and water. Do not chew, crush, or break tablets. 120 tablet 6     No current facility-administered medications for this visit.          Physical exam:  Vitals:    11/13/17 0756   BP: 131/61   Pulse: 84   Resp: 18   Temp: 36.4 ??C (97.5 ??F)   SpO2: 98%       General: in NAD, pleasant, comfortable appearing  HEENT:  EOMI. No oropharyngeal ulceration or erythema.   Lymph node exam:  No lymphadenopathy in the occipital, auricular, anterior/posterior cervical, submental, supraclavicular, inguinal regions.   CV:  RRR, no  Axillary murmurs, rubs, or gallops. Peripheral pulses 2+ b/l.   Resp:  CTAB, no wheezes or crackles. Comfortable on RA. No increased work of breathing.   Gastrointestinal: Soft, non-distended, non-tender to palpation. No hepatosplenomegaly. Normoactive bowel sounds. Skin:  No rashes, petechiae or purpura.  No areas of skin breakdown.  Musculoskeletal:  FROM of extremities. No joint effusions or tenderness.   Psychiatric:  Alert and oriented to person, place, time and situation. Appropriate affect and mood.     Neurologic:  No focal deficits. CN II-XII intact. Normal gait.   Extremities:  No clubbing or cyanosis. No peripheral edema.     ECOG Performance Status: 1    Orders/Results:  Labs and pathology have been reviewed and pertinent results are as follows:    Results for orders placed or performed in visit on 11/13/17   Comprehensive Metabolic Panel   Result Value Ref Range    Sodium 137 135 - 145 mmol/L    Potassium 4.0 3.5 - 5.0 mmol/L    Chloride 109 (H) 98 - 107 mmol/L    CO2 22.0 22.0 - 30.0 mmol/L    Anion Gap 6 (L) 9 - 15 mmol/L    BUN 24 (H) 7 - 21 mg/dL    Creatinine 1.61 (H) 0.60 - 1.00 mg/dL    BUN/Creatinine Ratio 23     EGFR CKD-EPI Non-African American, Female 51 (L) >=60 mL/min/1.78m2    EGFR CKD-EPI African American, Female 58 (L) >=60 mL/min/1.41m2    Glucose  146 65 - 179 mg/dL    Calcium 9.0 8.5 - 10.2 mg/dL    Albumin 3.5 3.5 - 5.0 g/dL    Total Protein 6.2 (L) 6.5 - 8.3 g/dL    Total Bilirubin 0.6 0.0 - 1.2 mg/dL    AST 21 14 - 38 U/L    ALT 21 15 - 48 U/L    Alkaline Phosphatase 97 38 - 126 U/L   Uric acid   Result Value Ref Range    Uric Acid 3.7 3.0 - 6.5 mg/dL   Lactate dehydrogenase   Result Value Ref Range    LDH 511 338 - 610 U/L   Phosphorus Level   Result Value Ref Range    Phosphorus 4.8 (H) 2.9 - 4.7 mg/dL   CBC w/ Differential   Result Value Ref Range    WBC 6.9 4.5 - 11.0 10*9/L    RBC 3.90 (L) 4.00 - 5.20 10*12/L    HGB 11.6 (L) 12.0 - 16.0 g/dL    HCT 72.5 (L) 36.6 - 46.0 %    MCV 88.9 80.0 - 100.0 fL    MCH 29.9 26.0 - 34.0 pg    MCHC 33.6 31.0 - 37.0 g/dL    RDW 44.0 (H) 34.7 - 15.0 %    MPV 9.2 7.0 - 10.0 fL    Platelet 36 (L) 150 - 440 10*9/L    Variable HGB Concentration Slight (A) Not Present    Neutrophils % 40.0 %    Lymphocytes % 45.9 %    Monocytes % 7.0 %    Eosinophils % 2.2 %    Basophils % 0.6 %    Neutrophil Left Shift 1+ (A) Not Present    Absolute Neutrophils 2.7 2.0 - 7.5 10*9/L    Absolute Lymphocytes 3.2 1.5 - 5.0 10*9/L    Absolute Monocytes 0.5 0.2 - 0.8 10*9/L    Absolute Eosinophils 0.2 0.0 - 0.4 10*9/L    Absolute Basophils 0.0 0.0 - 0.1 10*9/L    Large Unstained Cells 4 0 - 4 %    Anisocytosis Slight (A) Not Present    Hypochromasia Slight (A) Not Present

## 2017-11-14 ENCOUNTER — Other Ambulatory Visit: Admit: 2017-11-14 | Discharge: 2017-11-15 | Payer: MEDICARE

## 2017-11-14 ENCOUNTER — Ambulatory Visit: Admit: 2017-11-14 | Discharge: 2017-11-15 | Payer: MEDICARE

## 2017-11-14 ENCOUNTER — Ambulatory Visit: Admit: 2017-11-14 | Discharge: 2017-11-15 | Payer: MEDICARE | Attending: Adult Health | Primary: Adult Health

## 2017-11-14 DIAGNOSIS — D591 Other autoimmune hemolytic anemias: Secondary | ICD-10-CM

## 2017-11-14 DIAGNOSIS — C9110 Chronic lymphocytic leukemia of B-cell type not having achieved remission: Principal | ICD-10-CM

## 2017-11-14 DIAGNOSIS — Z452 Encounter for adjustment and management of vascular access device: Principal | ICD-10-CM

## 2017-11-14 LAB — COMPREHENSIVE METABOLIC PANEL
ALBUMIN: 3.5 g/dL (ref 3.5–5.0)
ALKALINE PHOSPHATASE: 86 U/L (ref 38–126)
ALT (SGPT): 18 U/L (ref 15–48)
AST (SGOT): 21 U/L (ref 14–38)
BILIRUBIN TOTAL: 0.5 mg/dL (ref 0.0–1.2)
BLOOD UREA NITROGEN: 21 mg/dL (ref 7–21)
BUN / CREAT RATIO: 23
CALCIUM: 8.9 mg/dL (ref 8.5–10.2)
CHLORIDE: 107 mmol/L (ref 98–107)
CO2: 22 mmol/L (ref 22.0–30.0)
CREATININE: 0.93 mg/dL (ref 0.60–1.00)
EGFR CKD-EPI AA FEMALE: 68 mL/min/{1.73_m2} (ref >=60)
EGFR CKD-EPI NON-AA FEMALE: 59 mL/min/{1.73_m2} — ABNORMAL LOW (ref >=60)
GLUCOSE RANDOM: 111 mg/dL (ref 65–179)
POTASSIUM: 3.8 mmol/L (ref 3.5–5.0)
PROTEIN TOTAL: 6 g/dL — ABNORMAL LOW (ref 6.5–8.3)
SODIUM: 137 mmol/L (ref 135–145)

## 2017-11-14 LAB — CBC W/ AUTO DIFF
BASOPHILS ABSOLUTE COUNT: 0 10*9/L (ref 0.0–0.1)
BASOPHILS RELATIVE PERCENT: 0.6 %
EOSINOPHILS ABSOLUTE COUNT: 0.2 10*9/L (ref 0.0–0.4)
EOSINOPHILS RELATIVE PERCENT: 3.4 %
HEMATOCRIT: 36 % (ref 36.0–46.0)
HEMOGLOBIN: 12.1 g/dL (ref 12.0–16.0)
LYMPHOCYTES ABSOLUTE COUNT: 2.1 10*9/L (ref 1.5–5.0)
LYMPHOCYTES RELATIVE PERCENT: 43.3 %
MEAN CORPUSCULAR HEMOGLOBIN CONC: 33.8 g/dL (ref 31.0–37.0)
MEAN CORPUSCULAR HEMOGLOBIN: 30 pg (ref 26.0–34.0)
MEAN CORPUSCULAR VOLUME: 88.9 fL (ref 80.0–100.0)
MONOCYTES ABSOLUTE COUNT: 0.4 10*9/L (ref 0.2–0.8)
MONOCYTES RELATIVE PERCENT: 7.6 %
NEUTROPHILS ABSOLUTE COUNT: 2.1 10*9/L (ref 2.0–7.5)
NEUTROPHILS RELATIVE PERCENT: 42 %
PLATELET COUNT: 34 10*9/L — ABNORMAL LOW (ref 150–440)
RED BLOOD CELL COUNT: 4.04 10*12/L (ref 4.00–5.20)
RED CELL DISTRIBUTION WIDTH: 16.5 % — ABNORMAL HIGH (ref 12.0–15.0)

## 2017-11-14 LAB — URIC ACID: Urate:MCnc:Pt:Ser/Plas:Qn:: 4.5

## 2017-11-14 LAB — EGFR CKD-EPI AA FEMALE: Lab: 68

## 2017-11-14 LAB — RED BLOOD CELL COUNT: Lab: 4.04

## 2017-11-14 LAB — LACTATE DEHYDROGENASE: Lactate dehydrogenase:CCnc:Pt:Ser/Plas:Qn:: 499

## 2017-11-14 LAB — PHOSPHORUS: Phosphate:MCnc:Pt:Ser/Plas:Qn:: 3.9

## 2017-11-14 NOTE — Unmapped (Signed)
Port accessed  and labs sent without complication.  Port flushed with saline and Heparin.   Patient ambulatory from lab accompanied by her grand daughter.

## 2017-11-14 NOTE — Unmapped (Signed)
[facility] Follow Up       Patient Name: Katrina Gould  Patient Age: 77 y.o.  Encounter Date: 11/14/2017      Chief complaint/Reason for visit: CLL/SLL      Assessment:  Katrina Gould is a 77 y.o. female who presents for follow of her CLL/SLL, Rai stage 4. Disease markers: 46,XX,der(4)t(4;4)(p14;q21). IGHV unmutated (0%, 3-33*01). B66m 5.05. No TP53 mutation. High-risk by CLL-IPI (6 points).    Katrina Gould is doing well.  Got fluids yesterday 2/2 inadequate oral intake and mild AKI.  Labs have improved today.  Did not pick up sevelamer due to cost; phos now down to 3.9.  Phos likely elevated at visit yesterday due to the AKI.  Per Dr. Lonni Fix, does not need to pick up sevelamer.  Will have her continue allopurinol.    Plans and Recommendations:  1. CLL, Rai 4   - ven/obi C1D22 yesterday; started venetoclax 20 mg yesterday  - here today for lab f/u  - okay to take next dose of ven  - RTC next week for C2D1 obi, lab check and 50mg  ramp up  - cont allopurinol     2. AKI  - resolving after receiving fluids yesterday  - encouraged increased PO fluids    3. Health Maintenance  - received Influenza vaccination this past season  - continue annual dermatology follow up  - still needs 2nd shingrix vaccine    4. Hoarseness  - ENT consult for further eval in August    5. Mid back pain  - exam reassuring and no focal s/s  - conservative mgmt    Due to this patient's diagnosis, she is at significant risk for subsequent morbidity and/or mortality.    Spent 40 min with pt, over 50% in counseling    Katrina Gould, AGPCNP-C, MSN, OCN  Nurse Practitioner  Hematologic Malignancies  [facility]  [phone] (phone)  [phone] (fax)  [email or web address]          Interval History:  Doing fair today.  Got fluids yesterday.  No changes since yesterday.      Otherwise, she denies new constitutional symptoms such as anorexia, weight loss, drenching night sweats or unexplained fevers.  Furthermore, she denies symptoms of marrow failure:  recurrent or unexplained intercurrent infections.  There have been no new or unexplained pains or self-identified masses, swelling or enlarged lymph nodes.    Past Medical, Surgical and Family History were reviewed and pertinent updates were made in the Electronic Medical Record      REVIEW OF SYSTEMS: 12 point review of systems negative unless indicated in interval history.      Oncology History:    Oncology History    CLL    First seen in oncology clinic locally 03/31/16    Had a left axillary biopsy 03/15/16 that confirmed CLL/SLL: Flow showed monoclonal B-cell population with CD5, CD23, kappa light chain. IHC showed positive CD20 (diffuse), negative for cyclin D1.    No constitutional symptoms except some sweats    Labs: Hb 12.0, Hct 34.8, plt 147. WBC was 10.6 with ANC 2.5. ALC was 7.5K.    CLL FISH studies: normal panel. No cells with 11q, tri 12, 17p or 13q, and the 11;14 translocation also not detected.    CT c/a/p 04/04/16 showed mild LAD in chest, abd, and pelvis and spleen was at ULN (13 x 5.6 x 10.9 cm). Largest node 1.6 cm.    Of note is that she had  a prior CT on 04/06/15 that showed similarly small enlarged node, largest being 1.9 cm.    CLL-IPI: As per Lancet Oncol Vol 15 October 2014  Includes (1) TP53 status (no abnormalities vs. Del 17p and/or TP53 mutation - 4 points) - no 17p (did not send TP53 mutation testing), (2) IGHV mutation status (mut vs unmut - 2 points) (pending) (3) serum B2-microglobulin (</=3.5 vs >3.5 - 2 points) (gets 2 points here) (4) clinical stage (Rai 0 vs Rai I-IV - 1 point) (gets 1 point here) and (5) age (</=65 vs >65 - 1 point)  (gets 1 point here)     Low risk (0-1 points)= 93.2% OS at 5 years  Int risk (2-3 points)= 79.3%  High risk (4-6 points)= 63.3%  - this patient has 4 points already, pending IGHV testing and prior to treatment would also need to send TP53 mutation testing  Very high risk (7-10 points)= 23.3%               CLL (chronic lymphocytic leukemia) (CMS-HCC)    05/16/2016 Initial Diagnosis     CLL (chronic lymphocytic leukemia) (RAF-HCC)           Chronic lymphocytic leukemia, Rai stage IV (CMS-HCC)    09/05/2017 - 10/11/2017 Chemotherapy     Chemotherapy Treatment    Treatment Goal Control   Line of Treatment [No plan line of treatment]   Plan Name OP RITUXIMAB    Start Date 09/14/2017   End Date 10/05/2017   Provider Pernell Dupre, MD   Chemotherapy riTUXimab (RITUXAN) 712.5 mg in sodium chloride (NS) 0.9 % 500 mL IVPB, 375 mg/m2 = 712.5 mg, Intravenous, Once, 1 of 1 cycle  Administration: 712.5 mg (09/14/2017)  riTUXimab (RITUXAN) 712.5 mg in sodium chloride (NS) 0.9 % 250 mL rapid infusion, 375 mg/m2 = 712.5 mg, Intravenous, Once, 1 of 1 cycle  Administration: 712.5 mg (09/21/2017), 712.5 mg (09/28/2017), 712.5 mg (10/05/2017)            10/16/2017 Initial Diagnosis     Chronic lymphocytic leukemia, Rai stage IV (CMS-HCC)         10/23/2017 -  Chemotherapy     Chemotherapy Treatment    Treatment Goal Control   Line of Treatment [No plan line of treatment]   Plan Name OP OBINUTUZUMAB AND VENETOCLAX   Start Date 10/23/2017   End Date 03/12/2018 (Planned)   Provider Pernell Dupre, MD   Chemotherapy dexamethasone (DECADRON) tablet 20 mg, 20 mg, Oral, Once, 1 of 6 cycles  Administration: 20 mg (10/23/2017), 20 mg (10/24/2017), 20 mg (10/30/2017), 20 mg (11/06/2017)  obinutuzumab 100 mg in sodium chloride (NS) 0.9 % 50 mL IVPB, 100 mg, Intravenous, Once, 1 of 6 cycles  Administration: 100 mg (10/23/2017), 900 mg (10/24/2017), 1,000 mg (10/30/2017), 1,000 mg (11/06/2017)               Social history:  Here with son  Lives alone  Husband died in 11/17/08      Allergies   Allergen Reactions   ??? Sertraline Other (See Comments)     Nervousness, panic, shortness of breath   ??? Topiramate Palpitations     Panic attacks; unable to sleep   ??? Baclofen      made me go crazy         Current Outpatient Medications   Medication Sig Dispense Refill ??? allopurinol (ZYLOPRIM) 300 MG tablet Take 1 tablet (300 mg total) by mouth daily. 30 tablet 2   ???  levoFLOXacin (LEVAQUIN) 750 MG tablet Take 1 tablet (750 mg total) by mouth daily. for 10 days 10 tablet 0   ??? LORazepam (ATIVAN) 0.5 MG tablet Take 0.5 mg by mouth two (2) times a day as needed.      ??? mirtazapine (REMERON) 7.5 MG tablet Take 7.5 mg by mouth nightly.     ??? multivitamin (TAB-A-VITE/THERAGRAN) per tablet Take 1 tablet by mouth daily.      ??? pantoprazole (PROTONIX) 40 MG tablet Take by mouth daily at 0600.      ??? pravastatin (PRAVACHOL) 80 MG tablet TAKE 1 TABLET BY MOUTH EVERY NIGHT AT BEDTIME     ??? sevelamer (RENAGEL) 800 MG tablet Take 1 tablet (800 mg total) by mouth Three (3) times a day with a meal. 90 tablet 11   ??? venetoclax (VENCLEXTA STARTING PACK) 10 mg-50 mg- 100 mg tablet Take two 10mg  tabs qday x7; one 50mg  tab qday x7; one 100mg  tab qday x7; two 100mg  tabs qday x7. 1 Package 0   ??? venetoclax (VENCLEXTA) 100 mg tablet Take 4 tablets (400 mg total) by mouth daily. Take with a meal and water. Do not chew, crush, or break tablets. 120 tablet 6     No current facility-administered medications for this visit.          Physical exam:  Vitals:    11/14/17 0951   BP: 121/62   Pulse: 90   Resp: 18   Temp: 36.5 ??C (97.7 ??F)   SpO2: 94%       General: in NAD, pleasant, comfortable appearing  HEENT:  EOMI. No oropharyngeal ulceration or erythema.   Lymph node exam:  No lymphadenopathy in the occipital, auricular, anterior/posterior cervical, submental, supraclavicular, inguinal regions.   CV:  RRR, no  Axillary murmurs, rubs, or gallops. Peripheral pulses 2+ b/l.   Resp:  CTAB, no wheezes or crackles. Comfortable on RA. No increased work of breathing.   Gastrointestinal: Soft, non-distended, non-tender to palpation. No hepatosplenomegaly. Normoactive bowel sounds.   Skin:  No rashes, petechiae or purpura.  No areas of skin breakdown.  Musculoskeletal:  FROM of extremities. No joint effusions or tenderness.   Psychiatric:  Alert and oriented to person, place, time and situation. Appropriate affect and mood.     Neurologic:  No focal deficits. CN II-XII intact. Normal gait.   Extremities:  No clubbing or cyanosis. No peripheral edema.     ECOG Performance Status: 1    Orders/Results:  Labs and pathology have been reviewed and pertinent results are as follows:    Results for orders placed or performed in visit on 11/14/17   CBC w/ Differential   Result Value Ref Range    WBC 4.9 4.5 - 11.0 10*9/L    RBC 4.04 4.00 - 5.20 10*12/L    HGB 12.1 12.0 - 16.0 g/dL    HCT 29.5 62.1 - 30.8 %    MCV 88.9 80.0 - 100.0 fL    MCH 30.0 26.0 - 34.0 pg    MCHC 33.8 31.0 - 37.0 g/dL    RDW 65.7 (H) 84.6 - 15.0 %    MPV 9.0 7.0 - 10.0 fL    Platelet 34 (L) 150 - 440 10*9/L    Variable HGB Concentration Slight (A) Not Present    Neutrophils % 42.0 %    Lymphocytes % 43.3 %    Monocytes % 7.6 %    Eosinophils % 3.4 %    Basophils % 0.6 %  Absolute Neutrophils 2.1 2.0 - 7.5 10*9/L    Absolute Lymphocytes 2.1 1.5 - 5.0 10*9/L    Absolute Monocytes 0.4 0.2 - 0.8 10*9/L    Absolute Eosinophils 0.2 0.0 - 0.4 10*9/L    Absolute Basophils 0.0 0.0 - 0.1 10*9/L    Large Unstained Cells 3 0 - 4 %    Anisocytosis Slight (A) Not Present    Hypochromasia Slight (A) Not Present

## 2017-11-20 ENCOUNTER — Ambulatory Visit: Admit: 2017-11-20 | Discharge: 2017-11-20 | Payer: MEDICARE

## 2017-11-20 ENCOUNTER — Other Ambulatory Visit: Admit: 2017-11-20 | Discharge: 2017-11-20 | Payer: MEDICARE

## 2017-11-20 ENCOUNTER — Ambulatory Visit: Admit: 2017-11-20 | Discharge: 2017-11-20 | Payer: MEDICARE | Attending: Adult Health | Primary: Adult Health

## 2017-11-20 DIAGNOSIS — R51 Headache: Secondary | ICD-10-CM

## 2017-11-20 DIAGNOSIS — D591 Other autoimmune hemolytic anemias: Secondary | ICD-10-CM

## 2017-11-20 DIAGNOSIS — C9110 Chronic lymphocytic leukemia of B-cell type not having achieved remission: Principal | ICD-10-CM

## 2017-11-20 LAB — CBC W/ AUTO DIFF
BASOPHILS ABSOLUTE COUNT: 0 10*9/L (ref 0.0–0.1)
BASOPHILS ABSOLUTE COUNT: 0 10*9/L (ref 0.0–0.1)
BASOPHILS RELATIVE PERCENT: 0.8 %
BASOPHILS RELATIVE PERCENT: 0.6 %
EOSINOPHILS ABSOLUTE COUNT: 0 10*9/L (ref 0.0–0.4)
EOSINOPHILS RELATIVE PERCENT: 0.2 %
HEMATOCRIT: 36.1 % (ref 36.0–46.0)
HEMATOCRIT: 37.5 % (ref 36.0–46.0)
HEMOGLOBIN: 12.3 g/dL (ref 12.0–16.0)
HEMOGLOBIN: 12.5 g/dL (ref 12.0–16.0)
LARGE UNSTAINED CELLS: 3 % (ref 0–4)
LARGE UNSTAINED CELLS: 3 % (ref 0–4)
LYMPHOCYTES ABSOLUTE COUNT: 2.7 10*9/L (ref 1.5–5.0)
LYMPHOCYTES ABSOLUTE COUNT: 1.7 10*9/L (ref 1.5–5.0)
LYMPHOCYTES RELATIVE PERCENT: 52 %
LYMPHOCYTES RELATIVE PERCENT: 37.8 %
MEAN CORPUSCULAR HEMOGLOBIN CONC: 34.2 g/dL (ref 31.0–37.0)
MEAN CORPUSCULAR HEMOGLOBIN CONC: 33.3 g/dL (ref 31.0–37.0)
MEAN CORPUSCULAR HEMOGLOBIN: 30.2 pg (ref 26.0–34.0)
MEAN CORPUSCULAR VOLUME: 88.4 fL (ref 80.0–100.0)
MEAN CORPUSCULAR VOLUME: 90.6 fL (ref 80.0–100.0)
MEAN PLATELET VOLUME: 9.3 fL (ref 7.0–10.0)
MEAN PLATELET VOLUME: 8.5 fL (ref 7.0–10.0)
MONOCYTES ABSOLUTE COUNT: 0.2 10*9/L (ref 0.2–0.8)
MONOCYTES ABSOLUTE COUNT: 0.1 10*9/L — ABNORMAL LOW (ref 0.2–0.8)
MONOCYTES RELATIVE PERCENT: 4.3 %
MONOCYTES RELATIVE PERCENT: 2 %
NEUTROPHILS ABSOLUTE COUNT: 1.9 10*9/L — ABNORMAL LOW (ref 2.0–7.5)
NEUTROPHILS ABSOLUTE COUNT: 2.6 10*9/L (ref 2.0–7.5)
NEUTROPHILS RELATIVE PERCENT: 36.9 %
NEUTROPHILS RELATIVE PERCENT: 56.8 %
PLATELET COUNT: 55 10*9/L — ABNORMAL LOW (ref 150–440)
RED BLOOD CELL COUNT: 4.08 10*12/L (ref 4.00–5.20)
RED BLOOD CELL COUNT: 4.13 10*12/L (ref 4.00–5.20)
RED CELL DISTRIBUTION WIDTH: 17.1 % — ABNORMAL HIGH (ref 12.0–15.0)
WBC ADJUSTED: 5.2 10*9/L (ref 4.5–11.0)
WBC ADJUSTED: 4.6 10*9/L (ref 4.5–11.0)

## 2017-11-20 LAB — COMPREHENSIVE METABOLIC PANEL
ALKALINE PHOSPHATASE: 91 U/L (ref 38–126)
ALKALINE PHOSPHATASE: 91 U/L (ref 38–126)
ALT (SGPT): 23 U/L (ref 15–48)
ALT (SGPT): 17 U/L (ref 15–48)
ANION GAP: 7 mmol/L — ABNORMAL LOW (ref 9–15)
ANION GAP: 10 mmol/L (ref 9–15)
AST (SGOT): 23 U/L (ref 14–38)
AST (SGOT): 31 U/L (ref 14–38)
BILIRUBIN TOTAL: 0.6 mg/dL (ref 0.0–1.2)
BILIRUBIN TOTAL: 0.6 mg/dL (ref 0.0–1.2)
BLOOD UREA NITROGEN: 15 mg/dL (ref 7–21)
BLOOD UREA NITROGEN: 15 mg/dL (ref 7–21)
BUN / CREAT RATIO: 17
BUN / CREAT RATIO: 21
CALCIUM: 8.8 mg/dL (ref 8.5–10.2)
CALCIUM: 8.9 mg/dL (ref 8.5–10.2)
CHLORIDE: 107 mmol/L (ref 98–107)
CHLORIDE: 108 mmol/L — ABNORMAL HIGH (ref 98–107)
CO2: 23 mmol/L (ref 22.0–30.0)
CO2: 20 mmol/L — ABNORMAL LOW (ref 22.0–30.0)
CREATININE: 0.88 mg/dL (ref 0.60–1.00)
CREATININE: 0.73 mg/dL (ref 0.60–1.00)
EGFR CKD-EPI AA FEMALE: 72 mL/min/{1.73_m2} (ref >=60)
EGFR CKD-EPI AA FEMALE: >90 mL/min/{1.73_m2} (ref >=60)
EGFR CKD-EPI NON-AA FEMALE: 63 mL/min/{1.73_m2} (ref >=60)
EGFR CKD-EPI NON-AA FEMALE: 79 mL/min/{1.73_m2} (ref >=60)
GLUCOSE RANDOM: 118 mg/dL (ref 65–179)
GLUCOSE RANDOM: 227 mg/dL — ABNORMAL HIGH (ref 65–179)
POTASSIUM: 3.6 mmol/L (ref 3.5–5.0)
POTASSIUM: 4.1 mmol/L (ref 3.5–5.0)
PROTEIN TOTAL: 6 g/dL — ABNORMAL LOW (ref 6.5–8.3)
PROTEIN TOTAL: 6.5 g/dL (ref 6.5–8.3)
SODIUM: 137 mmol/L (ref 135–145)
SODIUM: 138 mmol/L (ref 135–145)

## 2017-11-20 LAB — PHOSPHORUS
Phosphate:MCnc:Pt:Ser/Plas:Qn:: 3.2
Phosphate:MCnc:Pt:Ser/Plas:Qn:: 4.7

## 2017-11-20 LAB — GLUCOSE RANDOM: Glucose:MCnc:Pt:Ser/Plas:Qn:: 118

## 2017-11-20 LAB — LACTATE DEHYDROGENASE
Lactate dehydrogenase:CCnc:Pt:Ser/Plas:Qn:: 505
Lactate dehydrogenase:CCnc:Pt:Ser/Plas:Qn:: 607

## 2017-11-20 LAB — HEMATOCRIT: Lab: 37.5

## 2017-11-20 LAB — SODIUM: Sodium:SCnc:Pt:Ser/Plas:Qn:: 138

## 2017-11-20 LAB — MEAN CORPUSCULAR VOLUME: Lab: 88.4

## 2017-11-20 LAB — URIC ACID
Urate:MCnc:Pt:Ser/Plas:Qn:: 4
Urate:MCnc:Pt:Ser/Plas:Qn:: 4.1

## 2017-11-20 NOTE — Unmapped (Signed)
Patient labs drawn and sent for analysis. Care per E Ved

## 2017-11-20 NOTE — Unmapped (Addendum)
If you feel like you're having an emergency please call 911.  For appointments or questions Monday through Friday 8AM-5PM please call [phone] or Toll Free [phone]. For Medical questions or concerns ask for the Nurse Triage Line.  On Nights, Weekends, and Holidays call [phone] and ask for the Oncologist on Call.  Reasons to call the Nurse Triage Line:  Fever of 100.5 or greater  Nausea and/or vomiting not relived with nausea medicine  Diarrhea or constipation  Severe pain not relieved with usual pain regimen  Shortness of breath  Uncontrolled bleeding  Mental status changes    Lab on 11/20/2017   Component Date Value Ref Range Status   ??? Uric Acid 11/20/2017 4.1  3.0 - 6.5 mg/dL Final   ??? Phosphorus 11/20/2017 4.7  2.9 - 4.7 mg/dL Final   ??? LDH 75/64/3329 505  338 - 610 U/L Final   ??? Sodium 11/20/2017 137  135 - 145 mmol/L Final   ??? Potassium 11/20/2017 3.6  3.5 - 5.0 mmol/L Final   ??? Chloride 11/20/2017 107  98 - 107 mmol/L Final   ??? CO2 11/20/2017 23.0  22.0 - 30.0 mmol/L Final   ??? Anion Gap 11/20/2017 7* 9 - 15 mmol/L Final   ??? BUN 11/20/2017 15  7 - 21 mg/dL Final   ??? Creatinine 11/20/2017 0.88  0.60 - 1.00 mg/dL Final   ??? BUN/Creatinine Ratio 11/20/2017 17   Final   ??? EGFR CKD-EPI Non-African American,* 11/20/2017 63  >=60 mL/min/1.38m2 Final   ??? EGFR CKD-EPI African American, Fem* 11/20/2017 72  >=60 mL/min/1.70m2 Final   ??? Glucose 11/20/2017 118  65 - 179 mg/dL Final   ??? Calcium 51/88/4166 8.8  8.5 - 10.2 mg/dL Final   ??? Albumin 10/29/1599 3.5  3.5 - 5.0 g/dL Final   ??? Total Protein 11/20/2017 6.0* 6.5 - 8.3 g/dL Final   ??? Total Bilirubin 11/20/2017 0.6  0.0 - 1.2 mg/dL Final   ??? AST 09/32/3557 23  14 - 38 U/L Final   ??? ALT 11/20/2017 23  15 - 48 U/L Final   ??? Alkaline Phosphatase 11/20/2017 91  38 - 126 U/L Final   ??? WBC 11/20/2017 5.2  4.5 - 11.0 10*9/L Final   ??? RBC 11/20/2017 4.08  4.00 - 5.20 10*12/L Final   ??? HGB 11/20/2017 12.3  12.0 - 16.0 g/dL Final   ??? HCT 32/20/2542 36.1  36.0 - 46.0 % Final   ??? MCV 11/20/2017 88.4  80.0 - 100.0 fL Final   ??? MCH 11/20/2017 30.3  26.0 - 34.0 pg Final   ??? MCHC 11/20/2017 34.2  31.0 - 37.0 g/dL Final   ??? RDW 70/62/3762 17.1* 12.0 - 15.0 % Final   ??? MPV 11/20/2017 9.3  7.0 - 10.0 fL Final   ??? Platelet 11/20/2017 55* 150 - 440 10*9/L Final   ??? Variable HGB Concentration 11/20/2017 Slight* Not Present Final   ??? Neutrophils % 11/20/2017 36.9  % Final   ??? Lymphocytes % 11/20/2017 52.0  % Final   ??? Monocytes % 11/20/2017 4.3  % Final   ??? Eosinophils % 11/20/2017 2.8  % Final   ??? Basophils % 11/20/2017 0.8  % Final   ??? Absolute Neutrophils 11/20/2017 1.9* 2.0 - 7.5 10*9/L Final   ??? Absolute Lymphocytes 11/20/2017 2.7  1.5 - 5.0 10*9/L Final   ??? Absolute Monocytes 11/20/2017 0.2  0.2 - 0.8 10*9/L Final   ??? Absolute Eosinophils 11/20/2017 0.1  0.0 - 0.4 10*9/L Final   ???  Absolute Basophils 11/20/2017 0.0  0.0 - 0.1 10*9/L Final   ??? Large Unstained Cells 11/20/2017 3  0 - 4 % Final   ??? Anisocytosis 11/20/2017 Slight* Not Present Final   Hospital Outpatient Visit on 11/20/2017   Component Date Value Ref Range Status   ??? Uric Acid 11/20/2017 4.0  3.0 - 6.5 mg/dL Final   ??? Phosphorus 11/20/2017 3.2  2.9 - 4.7 mg/dL Final   ??? LDH 16/01/9603 607  338 - 610 U/L Final   ??? Sodium 11/20/2017 138  135 - 145 mmol/L Final   ??? Potassium 11/20/2017 4.1  3.5 - 5.0 mmol/L Final   ??? Chloride 11/20/2017 108* 98 - 107 mmol/L Final   ??? CO2 11/20/2017 20.0* 22.0 - 30.0 mmol/L Final   ??? Anion Gap 11/20/2017 10  9 - 15 mmol/L Final   ??? BUN 11/20/2017 15  7 - 21 mg/dL Final   ??? Creatinine 11/20/2017 0.73  0.60 - 1.00 mg/dL Final   ??? BUN/Creatinine Ratio 11/20/2017 21   Final   ??? EGFR CKD-EPI Non-African American,* 11/20/2017 79  >=60 mL/min/1.72m2 Final   ??? EGFR CKD-EPI African American, Fem* 11/20/2017 >90  >=60 mL/min/1.34m2 Final   ??? Glucose 11/20/2017 227* 65 - 179 mg/dL Final   ??? Calcium 54/12/8117 8.9  8.5 - 10.2 mg/dL Final   ??? Albumin 14/78/2956 3.9  3.5 - 5.0 g/dL Final   ??? Total Protein 11/20/2017 6.5  6.5 - 8.3 g/dL Final   ??? Total Bilirubin 11/20/2017 0.6  0.0 - 1.2 mg/dL Final   ??? AST 21/30/8657 31  14 - 38 U/L Final   ??? ALT 11/20/2017 17  15 - 48 U/L Final   ??? Alkaline Phosphatase 11/20/2017 91  38 - 126 U/L Final   ??? WBC 11/20/2017 4.6  4.5 - 11.0 10*9/L Final   ??? RBC 11/20/2017 4.13  4.00 - 5.20 10*12/L Final   ??? HGB 11/20/2017 12.5  12.0 - 16.0 g/dL Final   ??? HCT 84/69/6295 37.5  36.0 - 46.0 % Final   ??? MCV 11/20/2017 90.6  80.0 - 100.0 fL Final   ??? MCH 11/20/2017 30.2  26.0 - 34.0 pg Final   ??? MCHC 11/20/2017 33.3  31.0 - 37.0 g/dL Final   ??? RDW 28/41/3244 16.8* 12.0 - 15.0 % Final   ??? MPV 11/20/2017 8.5  7.0 - 10.0 fL Final   ??? Platelet 11/20/2017 69* 150 - 440 10*9/L Final   ??? Neutrophils % 11/20/2017 56.8  % Final   ??? Lymphocytes % 11/20/2017 37.8  % Final   ??? Monocytes % 11/20/2017 2.0  % Final   ??? Eosinophils % 11/20/2017 0.2  % Final   ??? Basophils % 11/20/2017 0.6  % Final   ??? Neutrophil Left Shift 11/20/2017 3+* Not Present Final   ??? Absolute Neutrophils 11/20/2017 2.6  2.0 - 7.5 10*9/L Final   ??? Absolute Lymphocytes 11/20/2017 1.7  1.5 - 5.0 10*9/L Final   ??? Absolute Monocytes 11/20/2017 0.1* 0.2 - 0.8 10*9/L Final   ??? Absolute Eosinophils 11/20/2017 0.0  0.0 - 0.4 10*9/L Final   ??? Absolute Basophils 11/20/2017 0.0  0.0 - 0.1 10*9/L Final   ??? Large Unstained Cells 11/20/2017 3  0 - 4 % Final   ??? Anisocytosis 11/20/2017 Slight* Not Present Final   ??? Hypochromasia 11/20/2017 Slight* Not Present Final

## 2017-11-20 NOTE — Unmapped (Signed)
[id number] - NP Nichols paged because platelets were 55.  Parameters state platelets >75.

## 2017-11-20 NOTE — Unmapped (Signed)
[facility] Follow Up       Patient Name: Katrina Gould  Patient Age: 77 y.o.  Encounter Date: 11/20/2017      Chief complaint/Reason for visit: CLL/SLL      Assessment:  Katrina Gould is a 77 y.o. female who presents for follow of her CLL/SLL, Rai stage 4. Disease markers: 46,XX,der(4)t(4;4)(p14;q21). IGHV unmutated (0%, 3-33*01). B35m 5.05. No TP53 mutation. High-risk by CLL-IPI (6 points).    Katrina Gould is doing well.  She describes a headache that is mostly posterior and in her cervical spine, then radiates down her arm.  Initially thought to be new, but on review, patient says that this has been happening for years.  She sees a [facility] for this.  Offered to do an MRI of head and c-spine, she initially declined but then decided she'd like to do it.  This was ordered and scheduled.    From CLL standpoint, doing well.  Receiving C2D1 of Obi without issues.    Plans and Recommendations:  1. CLL, Rai 4   - ven/obi C2D1 today; venetoclax 50 mg starts today  - RTC tomorrow for 24 hr lab f/u  - cont allopurinol only - did not pick up sevelamer due to price, phos came down with hydration and no increase today  - plts 69 today!    2. Health Maintenance  - received Influenza vaccination this past season  - continue annual dermatology follow up  - still needs 2nd shingrix vaccine    4. Hoarseness  - seeing ENT in August    5. Headache  - mostly back of head   - radiates down right arm  - cont chiro  - MRI of head and c-spine      Due to this patient's diagnosis, she is at significant risk for subsequent morbidity and/or mortality.    Spent 40 min with pt, over 50% in counseling    Markus Jarvis, AGPCNP-C, MSN, OCN  Nurse Practitioner  Hematologic Malignancies  [facility]  [phone] (phone)  [phone] (fax)  [email or web address]          Interval History:  Doing well.  Reports that she has some tension headache and pin in her neck and arm.  This started on Saturday. Tried ice and heat.  Saw chiropractor and will return again on Monday. No other concerns or complaints.    Otherwise, she denies new constitutional symptoms such as anorexia, weight loss, drenching night sweats or unexplained fevers.  Furthermore, she denies symptoms of marrow failure:  recurrent or unexplained intercurrent infections.  There have been no new or unexplained pains or self-identified masses, swelling or enlarged lymph nodes.    Past Medical, Surgical and Family History were reviewed and pertinent updates were made in the Electronic Medical Record      REVIEW OF SYSTEMS: 12 point review of systems negative unless indicated in interval history.      Oncology History:    Oncology History    CLL    First seen in oncology clinic locally 03/31/16    Had a left axillary biopsy 03/15/16 that confirmed CLL/SLL: Flow showed monoclonal B-cell population with CD5, CD23, kappa light chain. IHC showed positive CD20 (diffuse), negative for cyclin D1.    No constitutional symptoms except some sweats    Labs: Hb 12.0, Hct 34.8, plt 147. WBC was 10.6 with ANC 2.5. ALC was 7.5K.    CLL FISH studies: normal panel. No cells with 11q, tri 12, 17p  or 13q, and the 11;14 translocation also not detected.    CT c/a/p 04/04/16 showed mild LAD in chest, abd, and pelvis and spleen was at ULN (13 x 5.6 x 10.9 cm). Largest node 1.6 cm.    Of note is that she had a prior CT on 04/06/15 that showed similarly small enlarged node, largest being 1.9 cm.    CLL-IPI: As per Lancet Oncol Vol 15 October 2014  Includes (1) TP53 status (no abnormalities vs. Del 17p and/or TP53 mutation - 4 points) - no 17p (did not send TP53 mutation testing), (2) IGHV mutation status (mut vs unmut - 2 points) (pending) (3) serum B2-microglobulin (</=3.5 vs >3.5 - 2 points) (gets 2 points here) (4) clinical stage (Rai 0 vs Rai I-IV - 1 point) (gets 1 point here) and (5) age (</=65 vs >65 - 1 point)  (gets 1 point here)     Low risk (0-1 points)= 93.2% OS at 5 years  Int risk (2-3 points)= 79.3%  High risk (4-6 points)= 63.3%  - this patient has 4 points already, pending IGHV testing and prior to treatment would also need to send TP53 mutation testing  Very high risk (7-10 points)= 23.3%               CLL (chronic lymphocytic leukemia) (CMS-HCC)    05/16/2016 Initial Diagnosis     CLL (chronic lymphocytic leukemia) (RAF-HCC)        Chronic lymphocytic leukemia, Rai stage IV (CMS-HCC)    09/05/2017 - 10/11/2017 Chemotherapy     Chemotherapy Treatment    Treatment Goal Control   Line of Treatment [No plan line of treatment]   Plan Name OP RITUXIMAB    Start Date 09/14/2017   End Date 10/05/2017   Provider Pernell Dupre, MD   Chemotherapy riTUXimab (RITUXAN) 712.5 mg in sodium chloride (NS) 0.9 % 500 mL IVPB, 375 mg/m2 = 712.5 mg, Intravenous, Once, 1 of 1 cycle  Administration: 712.5 mg (09/14/2017)  riTUXimab (RITUXAN) 712.5 mg in sodium chloride (NS) 0.9 % 250 mL rapid infusion, 375 mg/m2 = 712.5 mg, Intravenous, Once, 1 of 1 cycle  Administration: 712.5 mg (09/21/2017), 712.5 mg (09/28/2017), 712.5 mg (10/05/2017)         10/16/2017 Initial Diagnosis     Chronic lymphocytic leukemia, Rai stage IV (CMS-HCC)      10/23/2017 -  Chemotherapy     Chemotherapy Treatment    Treatment Goal Control   Line of Treatment [No plan line of treatment]   Plan Name OP OBINUTUZUMAB AND VENETOCLAX   Start Date 10/23/2017   End Date 03/12/2018 (Planned)   Provider Pernell Dupre, MD   Chemotherapy dexamethasone (DECADRON) tablet 20 mg, 20 mg, Oral, Once, 2 of 6 cycles  Administration: 20 mg (10/23/2017), 20 mg (10/24/2017), 20 mg (10/30/2017), 20 mg (11/06/2017), 20 mg (11/20/2017)  obinutuzumab 100 mg in sodium chloride (NS) 0.9 % 50 mL IVPB, 100 mg, Intravenous, Once, 2 of 6 cycles  Administration: 100 mg (10/23/2017), 900 mg (10/24/2017), 1,000 mg (10/30/2017), 1,000 mg (11/06/2017)            Social history:  Here with son  Lives alone  Husband died in 2008/11/11      Allergies   Allergen Reactions   ??? Sertraline Other (See Comments)     Nervousness, panic, shortness of breath   ??? Topiramate Palpitations     Panic attacks; unable to sleep   ??? Baclofen      made me  go crazy         Current Outpatient Medications   Medication Sig Dispense Refill   ??? allopurinol (ZYLOPRIM) 300 MG tablet Take 1 tablet (300 mg total) by mouth daily. 30 tablet 2   ??? LORazepam (ATIVAN) 0.5 MG tablet Take 0.5 mg by mouth two (2) times a day as needed.      ??? mirtazapine (REMERON) 7.5 MG tablet Take 7.5 mg by mouth nightly.     ??? multivitamin (TAB-A-VITE/THERAGRAN) per tablet Take 1 tablet by mouth daily.      ??? pantoprazole (PROTONIX) 40 MG tablet Take by mouth daily at 0600.      ??? pravastatin (PRAVACHOL) 80 MG tablet TAKE 1 TABLET BY MOUTH EVERY NIGHT AT BEDTIME     ??? sevelamer (RENAGEL) 800 MG tablet Take 1 tablet (800 mg total) by mouth Three (3) times a day with a meal. 90 tablet 11   ??? venetoclax (VENCLEXTA STARTING PACK) 10 mg-50 mg- 100 mg tablet Take two 10mg  tabs qday x7; one 50mg  tab qday x7; one 100mg  tab qday x7; two 100mg  tabs qday x7. 1 Package 0   ??? venetoclax (VENCLEXTA) 100 mg tablet Take 4 tablets (400 mg total) by mouth daily. Take with a meal and water. Do not chew, crush, or break tablets. 120 tablet 6     No current facility-administered medications for this visit.      Facility-Administered Medications Ordered in Other Visits   Medication Dose Route Frequency Provider Last Rate Last Dose   ??? CHEMO CLARIFICATION ORDER   Other Once Virgil Benedict, AGNP       ??? OKAY TO SEND MEDICATION/CHEMOTHERAPY TO UNIT   Other Once Pernell Dupre, MD       ??? sodium chloride (NS) 0.9 % infusion  100 mL/hr Intravenous Continuous Pernell Dupre, MD 100 mL/hr at 11/20/17 1131 100 mL/hr at 11/20/17 1131         Physical exam:  There were no vitals filed for this visit.    General: in NAD, pleasant, comfortable appearing  HEENT:  EOMI. No oropharyngeal ulceration or erythema.   Lymph node exam:  No lymphadenopathy in the occipital, auricular, anterior/posterior cervical, submental, supraclavicular, inguinal regions.   CV:  RRR, no  Axillary murmurs, rubs, or gallops. Peripheral pulses 2+ b/l.   Resp:  CTAB, no wheezes or crackles. Comfortable on RA. No increased work of breathing.   Gastrointestinal: Soft, non-distended, non-tender to palpation. No hepatosplenomegaly. Normoactive bowel sounds.   Skin:  No rashes, petechiae or purpura.  No areas of skin breakdown.  Musculoskeletal:  FROM of extremities. No joint effusions or tenderness.   Psychiatric:  Alert and oriented to person, place, time and situation. Appropriate affect and mood.     Neurologic:  No focal deficits. CN II-XII intact. Normal gait.   Extremities:  No clubbing or cyanosis. No peripheral edema.     ECOG Performance Status: 1    Orders/Results:  Labs and pathology have been reviewed and pertinent results are as follows:    Results for orders placed or performed in visit on 11/20/17   Uric acid   Result Value Ref Range    Uric Acid 4.1 3.0 - 6.5 mg/dL   Phosphorus Level   Result Value Ref Range    Phosphorus 4.7 2.9 - 4.7 mg/dL   Lactate dehydrogenase   Result Value Ref Range    LDH 505 338 - 610 U/L   Comprehensive Metabolic Panel   Result  Value Ref Range    Sodium 137 135 - 145 mmol/L    Potassium 3.6 3.5 - 5.0 mmol/L    Chloride 107 98 - 107 mmol/L    CO2 23.0 22.0 - 30.0 mmol/L    Anion Gap 7 (L) 9 - 15 mmol/L    BUN 15 7 - 21 mg/dL    Creatinine 1.61 0.96 - 1.00 mg/dL    BUN/Creatinine Ratio 17     EGFR CKD-EPI Non-African American, Female 63 >=60 mL/min/1.5m2    EGFR CKD-EPI African American, Female 72 >=60 mL/min/1.54m2    Glucose 118 65 - 179 mg/dL    Calcium 8.8 8.5 - 04.5 mg/dL    Albumin 3.5 3.5 - 5.0 g/dL    Total Protein 6.0 (L) 6.5 - 8.3 g/dL    Total Bilirubin 0.6 0.0 - 1.2 mg/dL    AST 23 14 - 38 U/L    ALT 23 15 - 48 U/L    Alkaline Phosphatase 91 38 - 126 U/L   CBC w/ Differential   Result Value Ref Range    WBC 5.2 4.5 - 11.0 10*9/L    RBC 4.08 4.00 - 5.20 10*12/L    HGB 12.3 12.0 - 16.0 g/dL    HCT 40.9 81.1 - 91.4 %    MCV 88.4 80.0 - 100.0 fL    MCH 30.3 26.0 - 34.0 pg    MCHC 34.2 31.0 - 37.0 g/dL    RDW 78.2 (H) 95.6 - 15.0 %    MPV 9.3 7.0 - 10.0 fL    Platelet 55 (L) 150 - 440 10*9/L    Variable HGB Concentration Slight (A) Not Present    Neutrophils % 36.9 %    Lymphocytes % 52.0 %    Monocytes % 4.3 %    Eosinophils % 2.8 %    Basophils % 0.8 %    Absolute Neutrophils 1.9 (L) 2.0 - 7.5 10*9/L    Absolute Lymphocytes 2.7 1.5 - 5.0 10*9/L    Absolute Monocytes 0.2 0.2 - 0.8 10*9/L    Absolute Eosinophils 0.1 0.0 - 0.4 10*9/L    Absolute Basophils 0.0 0.0 - 0.1 10*9/L    Large Unstained Cells 3 0 - 4 %    Anisocytosis Slight (A) Not Present

## 2017-11-20 NOTE — Unmapped (Signed)
Pt presents in NAD, no complaints, pain free, no negative changes since last visit. Port accessed prior to arrival to infusion center.+blood return.    1315 Awaiting infusion to arrive. NAD. No complaints    1630 Report given to Autumn Patty, RN. Pt in NAD, pt aware of handoff and plan of care, no questions, no complaints voiced by patient at time of change of care givers.

## 2017-11-21 ENCOUNTER — Other Ambulatory Visit: Admit: 2017-11-21 | Discharge: 2017-11-22 | Payer: MEDICARE

## 2017-11-21 ENCOUNTER — Ambulatory Visit
Admit: 2017-11-21 | Discharge: 2017-11-22 | Payer: MEDICARE | Attending: Student in an Organized Health Care Education/Training Program | Primary: Student in an Organized Health Care Education/Training Program

## 2017-11-21 ENCOUNTER — Ambulatory Visit: Admit: 2017-11-21 | Discharge: 2017-11-22 | Payer: MEDICARE

## 2017-11-21 ENCOUNTER — Ambulatory Visit: Admit: 2017-11-21 | Discharge: 2017-11-22 | Payer: MEDICARE | Attending: Adult Health | Primary: Adult Health

## 2017-11-21 DIAGNOSIS — D591 Other autoimmune hemolytic anemias: Secondary | ICD-10-CM

## 2017-11-21 DIAGNOSIS — C9110 Chronic lymphocytic leukemia of B-cell type not having achieved remission: Principal | ICD-10-CM

## 2017-11-21 LAB — CBC W/ AUTO DIFF
BASOPHILS ABSOLUTE COUNT: 0 10*9/L (ref 0.0–0.1)
EOSINOPHILS ABSOLUTE COUNT: 0 10*9/L (ref 0.0–0.4)
EOSINOPHILS RELATIVE PERCENT: 0.1 %
HEMATOCRIT: 37.1 % (ref 36.0–46.0)
HEMOGLOBIN: 12.5 g/dL (ref 12.0–16.0)
LARGE UNSTAINED CELLS: 1 % (ref 0–4)
LYMPHOCYTES ABSOLUTE COUNT: 1.7 10*9/L (ref 1.5–5.0)
LYMPHOCYTES RELATIVE PERCENT: 23.4 %
MEAN CORPUSCULAR HEMOGLOBIN CONC: 33.7 g/dL (ref 31.0–37.0)
MEAN CORPUSCULAR HEMOGLOBIN: 30.1 pg (ref 26.0–34.0)
MEAN CORPUSCULAR VOLUME: 89.3 fL (ref 80.0–100.0)
MEAN PLATELET VOLUME: 8.8 fL (ref 7.0–10.0)
MONOCYTES ABSOLUTE COUNT: 0.2 10*9/L (ref 0.2–0.8)
MONOCYTES RELATIVE PERCENT: 2.9 %
NEUTROPHILS ABSOLUTE COUNT: 5.3 10*9/L (ref 2.0–7.5)
NEUTROPHILS RELATIVE PERCENT: 72 %
PLATELET COUNT: 78 10*9/L — ABNORMAL LOW (ref 150–440)
RED CELL DISTRIBUTION WIDTH: 16.8 % — ABNORMAL HIGH (ref 12.0–15.0)
WBC ADJUSTED: 7.3 10*9/L (ref 4.5–11.0)

## 2017-11-21 LAB — ANION GAP: Anion gap 3:SCnc:Pt:Ser/Plas:Qn:: 13

## 2017-11-21 LAB — SMEAR REVIEW

## 2017-11-21 LAB — COMPREHENSIVE METABOLIC PANEL
ALKALINE PHOSPHATASE: 94 U/L (ref 38–126)
ALT (SGPT): 18 U/L (ref 15–48)
AST (SGOT): 26 U/L (ref 14–38)
BLOOD UREA NITROGEN: 17 mg/dL (ref 7–21)
BUN / CREAT RATIO: 23
CALCIUM: 9 mg/dL (ref 8.5–10.2)
CHLORIDE: 104 mmol/L (ref 98–107)
CO2: 18 mmol/L — ABNORMAL LOW (ref 22.0–30.0)
CREATININE: 0.75 mg/dL (ref 0.60–1.00)
EGFR CKD-EPI AA FEMALE: 88 mL/min/{1.73_m2} (ref >=60)
EGFR CKD-EPI NON-AA FEMALE: 76 mL/min/{1.73_m2} (ref >=60)
GLUCOSE RANDOM: 194 mg/dL — ABNORMAL HIGH (ref 65–179)
POTASSIUM: 4.4 mmol/L (ref 3.5–5.0)
PROTEIN TOTAL: 6.3 g/dL — ABNORMAL LOW (ref 6.5–8.3)
SODIUM: 135 mmol/L (ref 135–145)

## 2017-11-21 LAB — PHOSPHORUS
PHOSPHORUS: 3.7 mg/dL (ref 2.9–4.7)
Phosphate:MCnc:Pt:Ser/Plas:Qn:: 3.7

## 2017-11-21 LAB — LACTATE DEHYDROGENASE: Lactate dehydrogenase:CCnc:Pt:Ser/Plas:Qn:: 637 — ABNORMAL HIGH

## 2017-11-21 LAB — NEUTROPHILS RELATIVE PERCENT: Lab: 72

## 2017-11-21 LAB — URIC ACID: Urate:MCnc:Pt:Ser/Plas:Qn:: 4.1

## 2017-11-21 NOTE — Unmapped (Signed)
0915:  Labs drawn and sent for analysis.  Care provided by  Ann Held, RN

## 2017-11-21 NOTE — Unmapped (Signed)
If you feel like this is an emergency please call 911.  For appointments or questions Monday through Friday 8AM-5PM please call [phone] or Toll Free [phone]. For Medical questions or concerns ask for the Nurse Triage Line.  On Nights, Weekends, and Holidays call [phone] and ask for the Oncologist on Call.  Reasons to call the Nurse Triage Line:  Fever of 100.5 or greater  Nausea and/or vomiting not relived with nausea medicine  Diarrhea or constipation  Severe pain not relieved with usual pain regimen  Shortness of breath  Uncontrolled bleeding  Mental status changes    Lab on 11/21/2017   Component Date Value Ref Range Status   ??? Uric Acid 11/21/2017 4.1  3.0 - 6.5 mg/dL Final   ??? Phosphorus 11/21/2017 3.7  2.9 - 4.7 mg/dL Final   ??? LDH 57/84/6962 637* 338 - 610 U/L Final   ??? Sodium 11/21/2017 135  135 - 145 mmol/L Final   ??? Potassium 11/21/2017 4.4  3.5 - 5.0 mmol/L Final   ??? Chloride 11/21/2017 104  98 - 107 mmol/L Final   ??? CO2 11/21/2017 18.0* 22.0 - 30.0 mmol/L Final   ??? Anion Gap 11/21/2017 13  9 - 15 mmol/L Final   ??? BUN 11/21/2017 17  7 - 21 mg/dL Final   ??? Creatinine 11/21/2017 0.75  0.60 - 1.00 mg/dL Final   ??? BUN/Creatinine Ratio 11/21/2017 23   Final   ??? EGFR CKD-EPI Non-African American,* 11/21/2017 76  >=60 mL/min/1.20m2 Final   ??? EGFR CKD-EPI African American, Fem* 11/21/2017 88  >=60 mL/min/1.56m2 Final   ??? Glucose 11/21/2017 194* 65 - 179 mg/dL Final   ??? Calcium 95/28/4132 9.0  8.5 - 10.2 mg/dL Final   ??? Albumin 44/05/270 3.8  3.5 - 5.0 g/dL Final   ??? Total Protein 11/21/2017 6.3* 6.5 - 8.3 g/dL Final   ??? Total Bilirubin 11/21/2017 0.7  0.0 - 1.2 mg/dL Final   ??? AST 53/66/4403 26  14 - 38 U/L Final   ??? ALT 11/21/2017 18  15 - 48 U/L Final   ??? Alkaline Phosphatase 11/21/2017 94  38 - 126 U/L Final   ??? WBC 11/21/2017 7.3  4.5 - 11.0 10*9/L Final   ??? RBC 11/21/2017 4.15  4.00 - 5.20 10*12/L Final   ??? HGB 11/21/2017 12.5  12.0 - 16.0 g/dL Final   ??? HCT 47/42/5956 37.1  36.0 - 46.0 % Final   ??? MCV 11/21/2017 89.3  80.0 - 100.0 fL Final   ??? MCH 11/21/2017 30.1  26.0 - 34.0 pg Final   ??? MCHC 11/21/2017 33.7  31.0 - 37.0 g/dL Final   ??? RDW 38/75/6433 16.8* 12.0 - 15.0 % Final   ??? MPV 11/21/2017 8.8  7.0 - 10.0 fL Final   ??? Platelet 11/21/2017 78* 150 - 440 10*9/L Final   ??? Variable HGB Concentration 11/21/2017 Slight* Not Present Final   ??? Neutrophils % 11/21/2017 72.0  % Final   ??? Lymphocytes % 11/21/2017 23.4  % Final   ??? Monocytes % 11/21/2017 2.9  % Final   ??? Eosinophils % 11/21/2017 0.1  % Final   ??? Basophils % 11/21/2017 0.3  % Final   ??? Neutrophil Left Shift 11/21/2017 2+* Not Present Final   ??? Absolute Neutrophils 11/21/2017 5.3  2.0 - 7.5 10*9/L Final   ??? Absolute Lymphocytes 11/21/2017 1.7  1.5 - 5.0 10*9/L Final   ??? Absolute Monocytes 11/21/2017 0.2  0.2 - 0.8 10*9/L Final   ???  Absolute Eosinophils 11/21/2017 0.0  0.0 - 0.4 10*9/L Final   ??? Absolute Basophils 11/21/2017 0.0  0.0 - 0.1 10*9/L Final   ??? Large Unstained Cells 11/21/2017 1  0 - 4 % Final   ??? Anisocytosis 11/21/2017 Slight* Not Present Final   ??? Hypochromasia 11/21/2017 Slight* Not Present Final

## 2017-11-21 NOTE — Unmapped (Signed)
Pt finished obin. Labs drawn at 1700. Results were within parameters in treatment plan. Port flushed with heparin and deaccessed. Pt left ambulatory and stable

## 2017-11-21 NOTE — Unmapped (Signed)
Patient appointment for lab check.  Labs WNL. Ok to discharge home.

## 2017-11-23 ENCOUNTER — Other Ambulatory Visit: Payer: Self-pay | Admitting: Physician Assistant

## 2017-11-23 ENCOUNTER — Ambulatory Visit
Admission: RE | Admit: 2017-11-23 | Discharge: 2017-11-23 | Disposition: A | Discharge status: Home / Self Care | Payer: Medicare HMO | Source: Ambulatory Visit | Attending: Physician Assistant | Admitting: Physician Assistant

## 2017-11-23 DIAGNOSIS — I6782 Cerebral ischemia: Secondary | ICD-10-CM | POA: Diagnosis not present

## 2017-11-23 DIAGNOSIS — G44209 Tension-type headache, unspecified, not intractable: Secondary | ICD-10-CM

## 2017-11-23 DIAGNOSIS — R9082 White matter disease, unspecified: Secondary | ICD-10-CM | POA: Insufficient documentation

## 2017-11-23 DIAGNOSIS — G319 Degenerative disease of nervous system, unspecified: Secondary | ICD-10-CM | POA: Insufficient documentation

## 2017-11-23 NOTE — Unmapped (Signed)
Hi Dr. Lonni Fix,    Dr. Ignacia Bayley has called requesting to speak with you directly regarding the following:    Dr. Mel Almond needs to consult regarding mutual patient Leanord Hawking.    Dr. Mel Almond is available at [phone].    A page has also been sent.    Thank you,  Kelli Hope  [facility]  [phone]

## 2017-11-27 ENCOUNTER — Other Ambulatory Visit: Admit: 2017-11-27 | Discharge: 2017-11-28 | Payer: MEDICARE

## 2017-11-27 ENCOUNTER — Ambulatory Visit: Admit: 2017-11-27 | Discharge: 2017-11-28 | Payer: MEDICARE | Attending: Adult Health | Primary: Adult Health

## 2017-11-27 ENCOUNTER — Ambulatory Visit: Admit: 2017-11-27 | Discharge: 2017-11-28 | Payer: MEDICARE

## 2017-11-27 DIAGNOSIS — R51 Headache: Secondary | ICD-10-CM

## 2017-11-27 DIAGNOSIS — C9110 Chronic lymphocytic leukemia of B-cell type not having achieved remission: Principal | ICD-10-CM

## 2017-11-27 LAB — CBC W/ AUTO DIFF
BASOPHILS ABSOLUTE COUNT: 0 10*9/L (ref 0.0–0.1)
BASOPHILS RELATIVE PERCENT: 0.5 %
EOSINOPHILS ABSOLUTE COUNT: 0 10*9/L (ref 0.0–0.4)
EOSINOPHILS RELATIVE PERCENT: 0.7 %
HEMOGLOBIN: 12.9 g/dL (ref 12.0–16.0)
LARGE UNSTAINED CELLS: 2 % (ref 0–4)
LYMPHOCYTES ABSOLUTE COUNT: 1.8 10*9/L (ref 1.5–5.0)
LYMPHOCYTES RELATIVE PERCENT: 51.4 %
MEAN CORPUSCULAR HEMOGLOBIN CONC: 33.8 g/dL (ref 31.0–37.0)
MEAN CORPUSCULAR HEMOGLOBIN: 29.8 pg (ref 26.0–34.0)
MEAN CORPUSCULAR VOLUME: 88.1 fL (ref 80.0–100.0)
MEAN PLATELET VOLUME: 8.6 fL (ref 7.0–10.0)
MONOCYTES ABSOLUTE COUNT: 0.4 10*9/L (ref 0.2–0.8)
MONOCYTES RELATIVE PERCENT: 10.5 %
NEUTROPHILS ABSOLUTE COUNT: 1.2 10*9/L — ABNORMAL LOW (ref 2.0–7.5)
NEUTROPHILS RELATIVE PERCENT: 34.5 %
PLATELET COUNT: 96 10*9/L — ABNORMAL LOW (ref 150–440)
WBC ADJUSTED: 3.5 10*9/L — ABNORMAL LOW (ref 4.5–11.0)

## 2017-11-27 LAB — URIC ACID: Urate:MCnc:Pt:Ser/Plas:Qn:: 3.9

## 2017-11-27 LAB — COMPREHENSIVE METABOLIC PANEL
ALBUMIN: 3.7 g/dL (ref 3.5–5.0)
ALKALINE PHOSPHATASE: 76 U/L (ref 38–126)
ALT (SGPT): 17 U/L (ref 15–48)
ANION GAP: 7 mmol/L — ABNORMAL LOW (ref 9–15)
AST (SGOT): 17 U/L (ref 14–38)
BILIRUBIN TOTAL: 0.8 mg/dL (ref 0.0–1.2)
BLOOD UREA NITROGEN: 24 mg/dL — ABNORMAL HIGH (ref 7–21)
BUN / CREAT RATIO: 27
CALCIUM: 8.6 mg/dL (ref 8.5–10.2)
CHLORIDE: 105 mmol/L (ref 98–107)
CO2: 24 mmol/L (ref 22.0–30.0)
CREATININE: 0.9 mg/dL (ref 0.60–1.00)
EGFR CKD-EPI AA FEMALE: 70 mL/min/{1.73_m2} (ref >=60)
GLUCOSE RANDOM: 118 mg/dL (ref 65–179)
POTASSIUM: 3.9 mmol/L (ref 3.5–5.0)
PROTEIN TOTAL: 6.2 g/dL — ABNORMAL LOW (ref 6.5–8.3)
SODIUM: 136 mmol/L (ref 135–145)

## 2017-11-27 LAB — POTASSIUM: Potassium:SCnc:Pt:Ser/Plas:Qn:: 3.9

## 2017-11-27 LAB — LACTATE DEHYDROGENASE
LACTATE DEHYDROGENASE: 300 U/L — ABNORMAL LOW (ref 338–610)
Lactate dehydrogenase:CCnc:Pt:Ser/Plas:Qn:: 300 — ABNORMAL LOW

## 2017-11-27 LAB — PHOSPHORUS: Phosphate:MCnc:Pt:Ser/Plas:Qn:: 4.1

## 2017-11-27 LAB — NEUTROPHILS RELATIVE PERCENT: Lab: 34.5

## 2017-11-27 MED ORDER — OXYCODONE 5 MG TABLET
ORAL_TABLET | Freq: Three times a day (TID) | ORAL | 0 refills | 0.00000 days | Status: CP | PRN
Start: 2017-11-27 — End: 2018-11-27

## 2017-11-27 NOTE — Unmapped (Signed)
Pharmacist Note: Venetoclax Ramp up    [name] reports for labs prior to initiating week 3 of venetoclax at 100 mg/ day. Her TLS labs are all WNL, she is OK to start venetoclax 100 mg today.     Patient complains of a headache that feels like a spasm at the back of her head. This has been going on for over a week. She has had a steroid taper (prednisone or methylprednisolone), a CT scan (nothing concerning) and 3 chiropractor visits. She denies any dizziness or confusion.    Katrina Jarvis, NP made aware and patient has scheduled appintment with her this afternoon.    Approximate time spent: 10 minutes      Katrina Gould, PharmD, CPP

## 2017-11-27 NOTE — Unmapped (Signed)
If you feel like this is an emergency please call 911.  For appointments or questions Monday through Friday 8AM-5PM please call [phone] or Toll Free [phone]. For Medical questions or concerns ask for the Nurse Triage Line.  On Nights, Weekends, and Holidays call [phone] and ask for the Oncologist on Call.  Reasons to call the Nurse Triage Line:  Fever of 100.5 or greater  Nausea and/or vomiting not relived with nausea medicine  Diarrhea or constipation  Severe pain not relieved with usual pain regimen  Shortness of breath  Uncontrolled bleeding  Mental status changes    Lab on 11/27/2017   Component Date Value Ref Range Status   ??? Sodium 11/27/2017 136  135 - 145 mmol/L Final   ??? Potassium 11/27/2017 3.9  3.5 - 5.0 mmol/L Final   ??? Chloride 11/27/2017 105  98 - 107 mmol/L Final   ??? CO2 11/27/2017 24.0  22.0 - 30.0 mmol/L Final   ??? Anion Gap 11/27/2017 7* 9 - 15 mmol/L Final   ??? BUN 11/27/2017 24* 7 - 21 mg/dL Final   ??? Creatinine 11/27/2017 0.90  0.60 - 1.00 mg/dL Final   ??? BUN/Creatinine Ratio 11/27/2017 27   Final   ??? EGFR CKD-EPI Non-African American,* 11/27/2017 61  >=60 mL/min/1.48m2 Final   ??? EGFR CKD-EPI African American, Fem* 11/27/2017 70  >=60 mL/min/1.37m2 Final   ??? Glucose 11/27/2017 118  65 - 179 mg/dL Final   ??? Calcium 51/88/4166 8.6  8.5 - 10.2 mg/dL Final   ??? Albumin 10/29/1599 3.7  3.5 - 5.0 g/dL Final   ??? Total Protein 11/27/2017 6.2* 6.5 - 8.3 g/dL Final   ??? Total Bilirubin 11/27/2017 0.8  0.0 - 1.2 mg/dL Final   ??? AST 09/32/3557 17  14 - 38 U/L Final   ??? ALT 11/27/2017 17  15 - 48 U/L Final   ??? Alkaline Phosphatase 11/27/2017 76  38 - 126 U/L Final   ??? Uric Acid 11/27/2017 3.9  3.0 - 6.5 mg/dL Final   ??? LDH 32/20/2542 300* 338 - 610 U/L Final   ??? Phosphorus 11/27/2017 4.1  2.9 - 4.7 mg/dL Final   ??? WBC 70/62/3762 3.5* 4.5 - 11.0 10*9/L Final   ??? RBC 11/27/2017 4.33  4.00 - 5.20 10*12/L Final   ??? HGB 11/27/2017 12.9  12.0 - 16.0 g/dL Final   ??? HCT 83/15/1761 38.1  36.0 - 46.0 % Final   ??? MCV 11/27/2017 88.1  80.0 - 100.0 fL Final   ??? MCH 11/27/2017 29.8  26.0 - 34.0 pg Final   ??? MCHC 11/27/2017 33.8  31.0 - 37.0 g/dL Final   ??? RDW 60/73/7106 16.5* 12.0 - 15.0 % Final   ??? MPV 11/27/2017 8.6  7.0 - 10.0 fL Final   ??? Platelet 11/27/2017 96* 150 - 440 10*9/L Final   ??? Neutrophils % 11/27/2017 34.5  % Final   ??? Lymphocytes % 11/27/2017 51.4  % Final   ??? Monocytes % 11/27/2017 10.5  % Final   ??? Eosinophils % 11/27/2017 0.7  % Final   ??? Basophils % 11/27/2017 0.5  % Final   ??? Absolute Neutrophils 11/27/2017 1.2* 2.0 - 7.5 10*9/L Final   ??? Absolute Lymphocytes 11/27/2017 1.8  1.5 - 5.0 10*9/L Final   ??? Absolute Monocytes 11/27/2017 0.4  0.2 - 0.8 10*9/L Final   ??? Absolute Eosinophils 11/27/2017 0.0  0.0 - 0.4 10*9/L Final   ??? Absolute Basophils 11/27/2017 0.0  0.0 - 0.1 10*9/L Final   ???  Large Unstained Cells 11/27/2017 2  0 - 4 % Final   ??? Anisocytosis 11/27/2017 Slight* Not Present Final

## 2017-11-27 NOTE — Unmapped (Signed)
Labs WNL, no indication for transfusion. Pt is awake and alert, NAD, VSS. Port de-accessed and pt discharged to next appt with Suezanne Cheshire, NP.

## 2017-11-27 NOTE — Unmapped (Signed)
0845:  Labs drawn and sent for analysis.  Care provided by  Rica Records, RN

## 2017-11-27 NOTE — Unmapped (Signed)
[facility] Follow Up       Patient Name: Katrina Gould  Patient Age: 77 y.o.  Encounter Date: 11/27/2017      Chief complaint/Reason for visit: CLL/SLL      Assessment:  Katrina Gould is a 77 y.o. female who presents for follow of her CLL/SLL, Rai stage 4. Disease markers: 46,XX,der(4)t(4;4)(p14;q21). IGHV unmutated (0%, 3-33*01). B91m 5.05. No TP53 mutation. High-risk by CLL-IPI (6 points).    Katrina Gould is doing fair.  Continues to have extreme headache pain.  She saw her PCP on Friday who did a head CT with no acute abnormalities.  She does have a neurologist locally, and I suggested she call the office when she leaves clinic today so that she can get in with him ASAP.      From CLL standpoint, doing well.  Ramping up to 100mg  ven today.  Her platelets continue to increase.      Plans and Recommendations:  1. CLL, Rai 4   - okay to ramp up to 100mg  today  - continue allopurinol  - platelets are 96k today    2. Health Maintenance  - received Influenza vaccination this past season  - continue annual dermatology follow up  - still needs 2nd shingrix vaccine    4. Hoarseness  - seeing ENT in August    5. Headache  - return to local neurology ASAP for evaluation and management      Due to this patient's diagnosis, she is at significant risk for subsequent morbidity and/or mortality.    Spent 40 min with pt, over 50% in counseling    Markus Jarvis, AGPCNP-C, MSN, OCN  Nurse Practitioner  Hematologic Malignancies  [facility]  [phone] (phone)  [phone] (fax)  [email or web address]          Interval History:  Doing fair.  Continues to have severe posterior headache.  Had CT of the head on Friday, and was started on prednisone by PCP.  She does not feel like the prednisone helped at all.  She has taken oxycodone with little relief (though she admits those were old), and benadryl has helped more.      Otherwise, she denies new constitutional symptoms such as anorexia, weight loss, drenching night sweats or unexplained fevers.  Furthermore, she denies symptoms of marrow failure:  recurrent or unexplained intercurrent infections.  There have been no new or unexplained pains or self-identified masses, swelling or enlarged lymph nodes.    Past Medical, Surgical and Family History were reviewed and pertinent updates were made in the Electronic Medical Record      REVIEW OF SYSTEMS: 12 point review of systems negative unless indicated in interval history.      Oncology History:    Oncology History    CLL    First seen in oncology clinic locally 03/31/16    Had a left axillary biopsy 03/15/16 that confirmed CLL/SLL: Flow showed monoclonal B-cell population with CD5, CD23, kappa light chain. IHC showed positive CD20 (diffuse), negative for cyclin D1.    No constitutional symptoms except some sweats    Labs: Hb 12.0, Hct 34.8, plt 147. WBC was 10.6 with ANC 2.5. ALC was 7.5K.    CLL FISH studies: normal panel. No cells with 11q, tri 12, 17p or 13q, and the 11;14 translocation also not detected.    CT c/a/p 04/04/16 showed mild LAD in chest, abd, and pelvis and spleen was at ULN (13 x 5.6 x 10.9 cm). Largest  node 1.6 cm.    Of note is that she had a prior CT on 04/06/15 that showed similarly small enlarged node, largest being 1.9 cm.    CLL-IPI: As per Lancet Oncol Vol 15 October 2014  Includes (1) TP53 status (no abnormalities vs. Del 17p and/or TP53 mutation - 4 points) - no 17p (did not send TP53 mutation testing), (2) IGHV mutation status (mut vs unmut - 2 points) (pending) (3) serum B2-microglobulin (</=3.5 vs >3.5 - 2 points) (gets 2 points here) (4) clinical stage (Rai 0 vs Rai I-IV - 1 point) (gets 1 point here) and (5) age (</=65 vs >65 - 1 point)  (gets 1 point here)     Low risk (0-1 points)= 93.2% OS at 5 years  Int risk (2-3 points)= 79.3%  High risk (4-6 points)= 63.3%  - this patient has 4 points already, pending IGHV testing and prior to treatment would also need to send TP53 mutation testing  Very high risk (7-10 points)= 23.3%               CLL (chronic lymphocytic leukemia) (CMS-HCC)    05/16/2016 Initial Diagnosis     CLL (chronic lymphocytic leukemia) (RAF-HCC)        Chronic lymphocytic leukemia, Rai stage IV (CMS-HCC)    09/05/2017 - 10/11/2017 Chemotherapy     Chemotherapy Treatment    Treatment Goal Control   Line of Treatment [No plan line of treatment]   Plan Name OP RITUXIMAB    Start Date 09/14/2017   End Date 10/05/2017   Provider Pernell Dupre, MD   Chemotherapy riTUXimab (RITUXAN) 712.5 mg in sodium chloride (NS) 0.9 % 500 mL IVPB, 375 mg/m2 = 712.5 mg, Intravenous, Once, 1 of 1 cycle  Administration: 712.5 mg (09/14/2017)  riTUXimab (RITUXAN) 712.5 mg in sodium chloride (NS) 0.9 % 250 mL rapid infusion, 375 mg/m2 = 712.5 mg, Intravenous, Once, 1 of 1 cycle  Administration: 712.5 mg (09/21/2017), 712.5 mg (09/28/2017), 712.5 mg (10/05/2017)         10/16/2017 Initial Diagnosis     Chronic lymphocytic leukemia, Rai stage IV (CMS-HCC)      10/23/2017 -  Chemotherapy     Chemotherapy Treatment    Treatment Goal Control   Line of Treatment [No plan line of treatment]   Plan Name OP OBINUTUZUMAB AND VENETOCLAX   Start Date 10/23/2017   End Date 03/12/2018 (Planned)   Provider Pernell Dupre, MD   Chemotherapy dexamethasone (DECADRON) tablet 20 mg, 20 mg, Oral, Once, 2 of 6 cycles  Administration: 20 mg (10/23/2017), 20 mg (10/24/2017), 20 mg (10/30/2017), 20 mg (11/06/2017), 20 mg (11/20/2017)  obinutuzumab 100 mg in sodium chloride (NS) 0.9 % 50 mL IVPB, 100 mg, Intravenous, Once, 2 of 6 cycles  Administration: 100 mg (10/23/2017), 900 mg (10/24/2017), 1,000 mg (10/30/2017), 1,000 mg (11/06/2017), 1,000 mg (11/20/2017)            Social history:  Here with son  Lives alone  Husband died in 24-Oct-2008      Allergies   Allergen Reactions   ??? Sertraline Other (See Comments)     Nervousness, panic, shortness of breath   ??? Topiramate Palpitations     Panic attacks; unable to sleep   ??? Baclofen made me go crazy         Current Outpatient Medications   Medication Sig Dispense Refill   ??? allopurinol (ZYLOPRIM) 300 MG tablet Take 1 tablet (300 mg total) by mouth daily. 30  tablet 2   ??? fesoterodine (TOVIAZ) 4 mg 24 hr tablet Take by mouth.     ??? LORazepam (ATIVAN) 0.5 MG tablet Take 0.5 mg by mouth two (2) times a day as needed.      ??? mirtazapine (REMERON) 7.5 MG tablet Take 7.5 mg by mouth nightly.     ??? multivitamin (TAB-A-VITE/THERAGRAN) per tablet Take 1 tablet by mouth daily.      ??? pantoprazole (PROTONIX) 40 MG tablet Take by mouth daily at 0600.      ??? pravastatin (PRAVACHOL) 80 MG tablet TAKE 1 TABLET BY MOUTH EVERY NIGHT AT BEDTIME     ??? sertraline (ZOLOFT) 50 MG tablet Take 50 mg by mouth daily.  1   ??? topiramate (TOPAMAX) 50 MG tablet Take 50 mg by mouth daily.  3   ??? venetoclax (VENCLEXTA STARTING PACK) 10 mg-50 mg- 100 mg tablet Take two 10mg  tabs qday x7; one 50mg  tab qday x7; one 100mg  tab qday x7; two 100mg  tabs qday x7. 1 Package 0   ??? venetoclax (VENCLEXTA) 100 mg tablet Take 4 tablets (400 mg total) by mouth daily. Take with a meal and water. Do not chew, crush, or break tablets. 120 tablet 6   ??? fluticasone propion-salmeterol (ADVAIR) 100-50 mcg/dose diskus Inhale.     ??? oxyCODONE (ROXICODONE) 5 MG immediate release tablet Take 1 tablet (5 mg total) by mouth every eight (8) hours as needed for pain. 20 tablet 0   ??? predniSONE (DELTASONE) 10 MG tablet 6 TABS X 1 DAY, 5 TABS X 1 DAY, 4 TABS X 1 DAY, ETC...  0     No current facility-administered medications for this visit.          Physical exam:  There were no vitals filed for this visit.    General: in NAD, pleasant, comfortable appearing  HEENT:  EOMI. No oropharyngeal ulceration or erythema.   Lymph node exam:  No lymphadenopathy in the occipital, auricular, anterior/posterior cervical, submental, supraclavicular, inguinal regions.   CV:  RRR, no  Axillary murmurs, rubs, or gallops. Peripheral pulses 2+ b/l.   Resp:  CTAB, no wheezes or crackles. Comfortable on RA. No increased work of breathing.   Gastrointestinal: Soft, non-distended, non-tender to palpation. No hepatosplenomegaly. Normoactive bowel sounds.   Skin:  No rashes, petechiae or purpura.  No areas of skin breakdown.  Musculoskeletal:  FROM of extremities. No joint effusions or tenderness.   Psychiatric:  Alert and oriented to person, place, time and situation. Appropriate affect and mood.     Neurologic:  No focal deficits. CN II-XII intact. Normal gait.   Extremities:  No clubbing or cyanosis. No peripheral edema.     ECOG Performance Status: 1    Orders/Results:  Labs and pathology have been reviewed and pertinent results are as follows:    Results for orders placed or performed in visit on 11/27/17   Comprehensive Metabolic Panel   Result Value Ref Range    Sodium 136 135 - 145 mmol/L    Potassium 3.9 3.5 - 5.0 mmol/L    Chloride 105 98 - 107 mmol/L    CO2 24.0 22.0 - 30.0 mmol/L    Anion Gap 7 (L) 9 - 15 mmol/L    BUN 24 (H) 7 - 21 mg/dL    Creatinine 1.61 0.96 - 1.00 mg/dL    BUN/Creatinine Ratio 27     EGFR CKD-EPI Non-African American, Female 61 >=60 mL/min/1.55m2    EGFR CKD-EPI African American, Female 34 >=  60 mL/min/1.65m2    Glucose 118 65 - 179 mg/dL    Calcium 8.6 8.5 - 98.1 mg/dL    Albumin 3.7 3.5 - 5.0 g/dL    Total Protein 6.2 (L) 6.5 - 8.3 g/dL    Total Bilirubin 0.8 0.0 - 1.2 mg/dL    AST 17 14 - 38 U/L    ALT 17 15 - 48 U/L    Alkaline Phosphatase 76 38 - 126 U/L   Uric acid   Result Value Ref Range    Uric Acid 3.9 3.0 - 6.5 mg/dL   Lactate dehydrogenase   Result Value Ref Range    LDH 300 (L) 338 - 610 U/L   Phosphorus Level   Result Value Ref Range    Phosphorus 4.1 2.9 - 4.7 mg/dL   CBC w/ Differential   Result Value Ref Range    WBC 3.5 (L) 4.5 - 11.0 10*9/L    RBC 4.33 4.00 - 5.20 10*12/L    HGB 12.9 12.0 - 16.0 g/dL    HCT 19.1 47.8 - 29.5 %    MCV 88.1 80.0 - 100.0 fL    MCH 29.8 26.0 - 34.0 pg    MCHC 33.8 31.0 - 37.0 g/dL    RDW 62.1 (H) 30.8 - 15.0 % MPV 8.6 7.0 - 10.0 fL    Platelet 96 (L) 150 - 440 10*9/L    Neutrophils % 34.5 %    Lymphocytes % 51.4 %    Monocytes % 10.5 %    Eosinophils % 0.7 %    Basophils % 0.5 %    Absolute Neutrophils 1.2 (L) 2.0 - 7.5 10*9/L    Absolute Lymphocytes 1.8 1.5 - 5.0 10*9/L    Absolute Monocytes 0.4 0.2 - 0.8 10*9/L    Absolute Eosinophils 0.0 0.0 - 0.4 10*9/L    Absolute Basophils 0.0 0.0 - 0.1 10*9/L    Large Unstained Cells 2 0 - 4 %    Anisocytosis Slight (A) Not Present

## 2017-12-03 ENCOUNTER — Other Ambulatory Visit: Payer: Self-pay | Admitting: Physician Assistant

## 2017-12-03 ENCOUNTER — Ambulatory Visit
Admission: RE | Admit: 2017-12-03 | Discharge: 2017-12-03 | Disposition: A | Discharge status: Home / Self Care | Payer: Medicare HMO | Source: Ambulatory Visit | Attending: Physician Assistant | Admitting: Physician Assistant

## 2017-12-03 DIAGNOSIS — K5730 Diverticulosis of large intestine without perforation or abscess without bleeding: Secondary | ICD-10-CM | POA: Diagnosis not present

## 2017-12-03 DIAGNOSIS — R109 Unspecified abdominal pain: Secondary | ICD-10-CM

## 2017-12-03 DIAGNOSIS — K449 Diaphragmatic hernia without obstruction or gangrene: Secondary | ICD-10-CM | POA: Diagnosis not present

## 2017-12-03 DIAGNOSIS — I700 Atherosclerosis of aorta: Secondary | ICD-10-CM | POA: Insufficient documentation

## 2017-12-03 NOTE — Unmapped (Signed)
Hi Dr. Lonni Fix,    Dr. Mahalia Longest with the [facility] clinic has called requesting to speak with you directly regarding the following:    Needs to consult regarding patient Katrina Gould.    Dr. Mel Almond is available at [phone].    A page has also been sent.    Thank you,  Kelli Hope  [facility]  [phone]

## 2017-12-04 ENCOUNTER — Ambulatory Visit: Admit: 2017-12-04 | Discharge: 2017-12-04 | Payer: MEDICARE

## 2017-12-04 ENCOUNTER — Other Ambulatory Visit: Admit: 2017-12-04 | Discharge: 2017-12-04 | Payer: MEDICARE

## 2017-12-04 DIAGNOSIS — C9110 Chronic lymphocytic leukemia of B-cell type not having achieved remission: Principal | ICD-10-CM

## 2017-12-04 LAB — COMPREHENSIVE METABOLIC PANEL
ALBUMIN: 3.3 g/dL — ABNORMAL LOW (ref 3.5–5.0)
ALKALINE PHOSPHATASE: 76 U/L (ref 38–126)
ALT (SGPT): 24 U/L (ref 15–48)
ANION GAP: 5 mmol/L — ABNORMAL LOW (ref 9–15)
AST (SGOT): 20 U/L (ref 14–38)
BILIRUBIN TOTAL: 1 mg/dL (ref 0.0–1.2)
BLOOD UREA NITROGEN: 20 mg/dL (ref 7–21)
BUN / CREAT RATIO: 22
CALCIUM: 8.3 mg/dL — ABNORMAL LOW (ref 8.5–10.2)
CHLORIDE: 106 mmol/L (ref 98–107)
CO2: 22 mmol/L (ref 22.0–30.0)
CREATININE: 0.89 mg/dL (ref 0.60–1.00)
EGFR CKD-EPI AA FEMALE: 71 mL/min/{1.73_m2} (ref >=60)
EGFR CKD-EPI NON-AA FEMALE: 62 mL/min/{1.73_m2} (ref >=60)
POTASSIUM: 4.4 mmol/L (ref 3.5–5.0)
PROTEIN TOTAL: 5.6 g/dL — ABNORMAL LOW (ref 6.5–8.3)
SODIUM: 133 mmol/L — ABNORMAL LOW (ref 135–145)

## 2017-12-04 LAB — CBC W/ AUTO DIFF
BASOPHILS RELATIVE PERCENT: 0.7 %
EOSINOPHILS ABSOLUTE COUNT: 0 10*9/L (ref 0.0–0.4)
EOSINOPHILS RELATIVE PERCENT: 0.6 %
HEMATOCRIT: 34 % — ABNORMAL LOW (ref 36.0–46.0)
HEMOGLOBIN: 11.6 g/dL — ABNORMAL LOW (ref 12.0–16.0)
LARGE UNSTAINED CELLS: 2 % (ref 0–4)
LYMPHOCYTES ABSOLUTE COUNT: 1.2 10*9/L — ABNORMAL LOW (ref 1.5–5.0)
MEAN CORPUSCULAR HEMOGLOBIN CONC: 34 g/dL (ref 31.0–37.0)
MEAN CORPUSCULAR HEMOGLOBIN: 29.7 pg (ref 26.0–34.0)
MEAN CORPUSCULAR VOLUME: 87.3 fL (ref 80.0–100.0)
MEAN PLATELET VOLUME: 9.1 fL (ref 7.0–10.0)
MONOCYTES ABSOLUTE COUNT: 0.3 10*9/L (ref 0.2–0.8)
MONOCYTES RELATIVE PERCENT: 10.1 %
NEUTROPHILS ABSOLUTE COUNT: 1.3 10*9/L — ABNORMAL LOW (ref 2.0–7.5)
NEUTROPHILS RELATIVE PERCENT: 44.5 %
PLATELET COUNT: 44 10*9/L — ABNORMAL LOW (ref 150–440)
RED BLOOD CELL COUNT: 3.89 10*12/L — ABNORMAL LOW (ref 4.00–5.20)
WBC ADJUSTED: 2.9 10*9/L — ABNORMAL LOW (ref 4.5–11.0)

## 2017-12-04 LAB — HEMOGLOBIN: Lab: 11.6 — ABNORMAL LOW

## 2017-12-04 LAB — PROTEIN TOTAL: Protein:MCnc:Pt:Ser/Plas:Qn:: 5.6 — ABNORMAL LOW

## 2017-12-04 LAB — LACTATE DEHYDROGENASE: Lactate dehydrogenase:CCnc:Pt:Ser/Plas:Qn:: 357

## 2017-12-04 LAB — PHOSPHORUS: Phosphate:MCnc:Pt:Ser/Plas:Qn:: 3.6

## 2017-12-04 LAB — URIC ACID: Urate:MCnc:Pt:Ser/Plas:Qn:: 4.5

## 2017-12-04 MED ORDER — LIDOCAINE 5 % TOPICAL PATCH
MEDICATED_PATCH | TRANSDERMAL | 0 refills | 0 days | Status: SS
Start: 2017-12-04 — End: 2018-01-17

## 2017-12-04 NOTE — Unmapped (Signed)
1105:  Labs drawn and sent for analysis.  Care provided by  Ann Held, RN

## 2017-12-04 NOTE — Unmapped (Signed)
[facility] Triage Note     Patient: Katrina Gould     Reason for call: Clarification of order     Time call returned: 14:59     Phone Assessment: Lt message for Judeth Cornfield to return call.     Triage Recommendations: N/A     Patient Response: N/A     Outstanding tasks: Awaiting a return call.

## 2017-12-04 NOTE — Unmapped (Signed)
Katrina Gould with [facility] contacted the Communication Center regarding the following:    - stated that Dr. Lonni Fix spoke with Dr. Mel Almond on yesterday about canceling a procedure; however, it is still showing up.  Judeth Cornfield would like to speak with someone about the procedure that needs to be canceled.      Please contact Stephanie at [phone].    Thanks in advance,    Tylene Fantasia  [facility] Communication Center   [phone]

## 2017-12-04 NOTE — Unmapped (Addendum)
Increase venetoclax to 200 mg per day.    For your back pain:    You can continue the oxycodone.    Let's add lidocaine patches. Also heat.    If the pain continues to worsen or not improve, you should go to the ER.    Lab on 12/04/2017   Component Date Value Ref Range Status   ??? ABO Grouping 12/04/2017 B NEG   Final   ??? Antibody Screen 12/04/2017 NEG   Final   ??? Sodium 12/04/2017 133* 135 - 145 mmol/L Final   ??? Potassium 12/04/2017 4.4  3.5 - 5.0 mmol/L Final   ??? Chloride 12/04/2017 106  98 - 107 mmol/L Final   ??? CO2 12/04/2017 22.0  22.0 - 30.0 mmol/L Final   ??? Anion Gap 12/04/2017 5* 9 - 15 mmol/L Final   ??? BUN 12/04/2017 20  7 - 21 mg/dL Final   ??? Creatinine 12/04/2017 0.89  0.60 - 1.00 mg/dL Final   ??? BUN/Creatinine Ratio 12/04/2017 22   Final   ??? EGFR CKD-EPI Non-African American,* 12/04/2017 62  >=60 mL/min/1.77m2 Final   ??? EGFR CKD-EPI African American, Fem* 12/04/2017 71  >=60 mL/min/1.31m2 Final   ??? Glucose 12/04/2017 112  65 - 179 mg/dL Final   ??? Calcium 16/01/9603 8.3* 8.5 - 10.2 mg/dL Final   ??? Albumin 54/12/8117 3.3* 3.5 - 5.0 g/dL Final   ??? Total Protein 12/04/2017 5.6* 6.5 - 8.3 g/dL Final   ??? Total Bilirubin 12/04/2017 1.0  0.0 - 1.2 mg/dL Final   ??? AST 14/78/2956 20  14 - 38 U/L Final   ??? ALT 12/04/2017 24  15 - 48 U/L Final   ??? Alkaline Phosphatase 12/04/2017 76  38 - 126 U/L Final   ??? Uric Acid 12/04/2017 4.5  3.0 - 6.5 mg/dL Final   ??? LDH 21/30/8657 357  338 - 610 U/L Final   ??? Phosphorus 12/04/2017 3.6  2.9 - 4.7 mg/dL Final   ??? WBC 84/69/6295 2.9* 4.5 - 11.0 10*9/L Final   ??? RBC 12/04/2017 3.89* 4.00 - 5.20 10*12/L Final   ??? HGB 12/04/2017 11.6* 12.0 - 16.0 g/dL Final   ??? HCT 28/41/3244 34.0* 36.0 - 46.0 % Final   ??? MCV 12/04/2017 87.3  80.0 - 100.0 fL Final   ??? MCH 12/04/2017 29.7  26.0 - 34.0 pg Final   ??? MCHC 12/04/2017 34.0  31.0 - 37.0 g/dL Final   ??? RDW 05/03/7251 17.3* 12.0 - 15.0 % Final   ??? MPV 12/04/2017 9.1  7.0 - 10.0 fL Final   ??? Platelet 12/04/2017 44* 150 - 440 10*9/L Final Questionable result confirmed. Specimen integrity/identity confirmed.    ??? Variable HGB Concentration 12/04/2017 Slight* Not Present Final   ??? Neutrophils % 12/04/2017 44.5  % Final   ??? Lymphocytes % 12/04/2017 41.6  % Final   ??? Monocytes % 12/04/2017 10.1  % Final   ??? Eosinophils % 12/04/2017 0.6  % Final   ??? Basophils % 12/04/2017 0.7  % Final   ??? Absolute Neutrophils 12/04/2017 1.3* 2.0 - 7.5 10*9/L Final   ??? Absolute Lymphocytes 12/04/2017 1.2* 1.5 - 5.0 10*9/L Final   ??? Absolute Monocytes 12/04/2017 0.3  0.2 - 0.8 10*9/L Final   ??? Absolute Eosinophils 12/04/2017 0.0  0.0 - 0.4 10*9/L Final   ??? Absolute Basophils 12/04/2017 0.0  0.0 - 0.1 10*9/L Final   ??? Large Unstained Cells 12/04/2017 2  0 - 4 % Final   ??? Microcytosis  12/04/2017 Slight* Not Present Final   ??? Anisocytosis 12/04/2017 Slight* Not Present Final

## 2017-12-05 NOTE — Unmapped (Signed)
[facility] Triage Note     Patient: Katrina Gould     Reason for call:  Removal of port-needle    Time call returned: 14:10     Phone Assessment: Notified patient that PCP office policy does not allow for them to remove her port-a-cath needle. Patient given option to come to clinic for removal, go to local ED or I could instruct her over the phone how to remove port needle (no flushing needed).     Triage Recommendations: Patient placed phone on speaker, washed hands, removed dressing, grabbed both of the yellow tabs of the port needle and pulled out needle (discussed proper disposal).  Patient tolerated well, no bleeding or pain noted     Patient Response: Patient expressed appreciation.     Outstanding tasks: N/A

## 2017-12-05 NOTE — Unmapped (Signed)
[facility] Triage Note     Patient: Katrina Gould     Reason for call: Port-needle     Time call returned: 8:58     Phone Assessment: Lt message for patient to return call.     Triage Recommendations: N/A     Patient Response: N/A     Outstanding tasks: Awaiting a return call from patient.

## 2017-12-05 NOTE — Unmapped (Addendum)
[facility] Triage Note     Patient: Katrina Gould     Reason for call: PCP regarding port-a-cath needle removal    Time call returned: 8:38     Phone Assessment: Spoke with Marcelino Duster from [facility] (patients PCP), notified her that patient contacted the resident on call last evening and stated that her port-a cath needle accidentally was lt accessed after being seen in the clinic today. Patient was instructed to go to her PCP or the ED for removal.    Spoke with Marcelino Duster RN notified that port-a-cath was flush with saline and heparin yesterday and that the port needle could be removed no flushing required.  Recommended to contact triage nurse line and request charge nurse if assistance is needed regarding the port-a -cath removal.     Triage Recommendations: N/A     Patient Response: N/A     Outstanding tasks: Spoke with M.Matson NP & updated with information.

## 2017-12-05 NOTE — Unmapped (Signed)
Ewing Schlein contacted the [employer] regarding the following:    - Wants to speak with her nursing team about getting the needle removed from her port.     Please contact [name] at [phone].    Thanks in advance,    Jodi Mourning  [facility] Communication Center   [phone]

## 2017-12-05 NOTE — Unmapped (Signed)
[facility] Triage Note     Patient: Katrina Gould     Reason for call: Contact PCP regarding port needle removal    Time call returned: 13:45     Phone Assessment: Spoke with Marcelino Duster RN, she inquired with her management and they policy of the practice does not allow them to remove port accessed needles.     Triage Recommendations: Will contact patient     Patient Response: N/A     Outstanding tasks: N/A    Patient Pharmacy has been verified and primary pharmacy has been marked as preferred

## 2017-12-05 NOTE — Unmapped (Signed)
[facility] Triage Note     Patient: Katrina Gould     Reason for call:  Port-a-Cath accessed    Time call returned: 12:28     Phone Assessment: Spoke with patient, she states she contacted her PCP and that they do not remove port-a - cath needles.     Triage Recommendations: Will contact Marcelino Duster RN @ PCP office Flora Lipps, (lt message for [facility] @ 12:33 for a return call and to have phone room send call to the charge nurse phone).    Patient Response: Verbalized understanding     Outstanding tasks: N/A   Patient Pharmacy has been verified and primary pharmacy has been marked as preferred

## 2017-12-05 NOTE — Unmapped (Signed)
Katrina Gould is calling in to report that her port was accidentally left accessed after clinic today. Discussed either going to the ED or checking with her PCP to see if it can be de-accessed tomorrow. She will call her PCP tomorrow to see if they can do it.     Teresa Pelton, MD  Fellow, PGY-6, Medicine Hematology & Pediatric Hematology/Oncology  12/04/17 7:13 PM

## 2017-12-06 NOTE — Unmapped (Signed)
[facility] Triage Note     Patient: Katrina Gould     Reason for call: Evaluate back pain    Time call returned: 8:17     Phone Assessment: Lt message on patient voicemail for a return call.     Triage Recommendations: N/A     Patient Response: N/A     Outstanding tasks: Awaiting a return call from patient.

## 2017-12-07 NOTE — Unmapped (Signed)
[facility] Follow Up       Patient Name: Katrina Gould  Patient Age: 77 y.o.  Encounter Date: 12/04/2017      Chief complaint/Reason for visit: CLL/SLL    Assessment:  Katrina Gould is a 77 y.o. female who presents for follow of her CLL/SLL, Rai stage 4. Disease markers: 46,XX,der(4)t(4;4)(p14;q21). IGHV unmutated (0%, 3-33*01). B36m 5.05. No TP53 mutation. High-risk by CLL-IPI (6 points).    From a CLL perspective, Katrina Gould is doing well, tolerating the venetoclax well with no evidence of tumor lysis. Based on today's labs, history and physical exam, she can increase her daily dose of venetoclax to 200 mg daily.    Regarding her severe right flank pain, all of the imaging done yesterday at her PCP's office, which I reviewed, was unrevealing. On my exam today, there is point tenderness in the area where she c/o pain. I will try supportive care measures outlined below and instructed her to go to the ER if her pain worsens/does not start improving.    Plans and Recommendations:  1. CLL, Rai 4   - Increase venetoclax to 200 mg daily  - RTC in one week  - continue allopurinol    2. Health Maintenance  - received Influenza vaccination this past season  - continue annual dermatology follow up  - still needs 2nd shingrix vaccine    3. Right flank pain: given point tenderness and negative imaging (I reviewed outside imaging results), likely MSK in nature  - Continue PRN oxycodone.  - Prescription written for lidocaine patches  - may try heat  - Instructed patient to go to the ER if her pain worsens/does not improve    4. Hoarseness  - seeing ENT in August    5. Headache  - improved; now on topamax      Due to this patient's diagnosis, she is at significant risk for subsequent morbidity and/or mortality.    Dr. Lonni Fix was available.    Mariana Kaufman, AGPCNP-BC  Nurse Practitioner  Hematology/Oncology  [facility]  12/04/2017      Interval History:  Since last seen, Katrina Gould developed severe right flank pain. She went to her PCP who did CT abd/pelvis and chest xray, both of which were unrevealing. She needed oxycodone to go to sleep last night. The whole area feels sore to the touch. The pain started one day last week and got progressively worse over the weekend. Today, the pain is 8/10, grabbing in a nature and comes and goes, regardless of movement or sitting still.    Denies intercurrent infection. Denies fevers. Denies unexplained bleeding.     Denies headache. Denies CP. Denies SOB/cough. Denies n/v/d/c. Denies LE edema. Denies rash.    Otherwise, she denies new constitutional symptoms such as anorexia, weight loss, drenching night sweats or unexplained fevers.  Furthermore, she denies symptoms of marrow failure:  recurrent or unexplained intercurrent infections.  There have been no new or unexplained pains or self-identified masses, swelling or enlarged lymph nodes.    Past Medical, Surgical and Family History were reviewed and pertinent updates were made in the Electronic Medical Record  ??  Review of Systems:  Other than as reported above in the interim history, the other systems reviewed were unremarkable.  ??  ECOG Performance Status: 1     Oncology History:    Oncology History    CLL    First seen in oncology clinic locally 03/31/16    Had a  left axillary biopsy 03/15/16 that confirmed CLL/SLL: Flow showed monoclonal B-cell population with CD5, CD23, kappa light chain. IHC showed positive CD20 (diffuse), negative for cyclin D1.    No constitutional symptoms except some sweats    Labs: Hb 12.0, Hct 34.8, plt 147. WBC was 10.6 with ANC 2.5. ALC was 7.5K.    CLL FISH studies: normal panel. No cells with 11q, tri 12, 17p or 13q, and the 11;14 translocation also not detected.    CT c/a/p 04/04/16 showed mild LAD in chest, abd, and pelvis and spleen was at ULN (13 x 5.6 x 10.9 cm). Largest node 1.6 cm.    Of note is that she had a prior CT on 04/06/15 that showed similarly small enlarged node, largest being 1.9 cm.    CLL-IPI: As per Lancet Oncol Vol 15 October 2014  Includes (1) TP53 status (no abnormalities vs. Del 17p and/or TP53 mutation - 4 points) - no 17p (did not send TP53 mutation testing), (2) IGHV mutation status (mut vs unmut - 2 points) (pending) (3) serum B2-microglobulin (</=3.5 vs >3.5 - 2 points) (gets 2 points here) (4) clinical stage (Rai 0 vs Rai I-IV - 1 point) (gets 1 point here) and (5) age (</=65 vs >65 - 1 point)  (gets 1 point here)     Low risk (0-1 points)= 93.2% OS at 5 years  Int risk (2-3 points)= 79.3%  High risk (4-6 points)= 63.3%  - this patient has 4 points already, pending IGHV testing and prior to treatment would also need to send TP53 mutation testing  Very high risk (7-10 points)= 23.3%               CLL (chronic lymphocytic leukemia) (CMS-HCC)    05/16/2016 Initial Diagnosis     CLL (chronic lymphocytic leukemia) (RAF-HCC)        Chronic lymphocytic leukemia, Rai stage IV (CMS-HCC)    09/05/2017 - 10/11/2017 Chemotherapy     Chemotherapy Treatment    Treatment Goal Control   Line of Treatment [No plan line of treatment]   Plan Name OP RITUXIMAB    Start Date 09/14/2017   End Date 10/05/2017   Provider Pernell Dupre, MD   Chemotherapy riTUXimab (RITUXAN) 712.5 mg in sodium chloride (NS) 0.9 % 500 mL IVPB, 375 mg/m2 = 712.5 mg, Intravenous, Once, 1 of 1 cycle  Administration: 712.5 mg (09/14/2017)  riTUXimab (RITUXAN) 712.5 mg in sodium chloride (NS) 0.9 % 250 mL rapid infusion, 375 mg/m2 = 712.5 mg, Intravenous, Once, 1 of 1 cycle  Administration: 712.5 mg (09/21/2017), 712.5 mg (09/28/2017), 712.5 mg (10/05/2017)         10/16/2017 Initial Diagnosis     Chronic lymphocytic leukemia, Rai stage IV (CMS-HCC)      10/23/2017 -  Chemotherapy     Chemotherapy Treatment    Treatment Goal Control   Line of Treatment [No plan line of treatment]   Plan Name OP OBINUTUZUMAB AND VENETOCLAX   Start Date 10/23/2017   End Date 03/12/2018 (Planned)   Provider Pernell Dupre, MD Chemotherapy dexamethasone (DECADRON) tablet 20 mg, 20 mg, Oral, Once, 2 of 6 cycles  Administration: 20 mg (10/23/2017), 20 mg (10/24/2017), 20 mg (10/30/2017), 20 mg (11/06/2017), 20 mg (11/20/2017)  obinutuzumab 100 mg in sodium chloride (NS) 0.9 % 50 mL IVPB, 100 mg, Intravenous, Once, 2 of 6 cycles  Administration: 100 mg (10/23/2017), 900 mg (10/24/2017), 1,000 mg (10/30/2017), 1,000 mg (11/06/2017), 1,000 mg (11/20/2017)  Social history:  Here with son  Lives alone  Husband died in 2008-10-30    Allergies   Allergen Reactions   ??? Sertraline Other (See Comments)     Nervousness, panic, shortness of breath   ??? Topiramate Palpitations     Panic attacks; unable to sleep   ??? Baclofen      made me go crazy         Current Outpatient Medications   Medication Sig Dispense Refill   ??? allopurinol (ZYLOPRIM) 300 MG tablet Take 1 tablet (300 mg total) by mouth daily. 30 tablet 2   ??? LORazepam (ATIVAN) 0.5 MG tablet Take 0.5 mg by mouth two (2) times a day as needed.      ??? mirtazapine (REMERON) 7.5 MG tablet Take 7.5 mg by mouth nightly.     ??? multivitamin (TAB-A-VITE/THERAGRAN) per tablet Take 1 tablet by mouth daily.      ??? oxyCODONE (ROXICODONE) 5 MG immediate release tablet Take 1 tablet (5 mg total) by mouth every eight (8) hours as needed for pain. 20 tablet 0   ??? pantoprazole (PROTONIX) 40 MG tablet Take by mouth daily at 0600.      ??? pravastatin (PRAVACHOL) 80 MG tablet TAKE 1 TABLET BY MOUTH EVERY NIGHT AT BEDTIME     ??? sertraline (ZOLOFT) 50 MG tablet Take 50 mg by mouth daily.  1   ??? topiramate (TOPAMAX) 50 MG tablet Take 50 mg by mouth daily.  3   ??? venetoclax (VENCLEXTA) 100 mg tablet TAKE 4 TABLETS BY MOUTH ONCE DAILY WITH A MEAL AND WATER - DO NOT CHEW CRUSH OR BREAK TABLETS 120 each 6   ??? fesoterodine (TOVIAZ) 4 mg 24 hr tablet Take by mouth.     ??? fluticasone propion-salmeterol (ADVAIR) 100-50 mcg/dose diskus Inhale.     ??? lidocaine (LIDODERM) 5 % patch Place 1 patch on the skin daily. Apply to affected area for 12 hours only each day (then remove patch) 5 patch 0   ??? predniSONE (DELTASONE) 10 MG tablet 6 TABS X 1 DAY, 5 TABS X 1 DAY, 4 TABS X 1 DAY, ETC...  0   ??? venetoclax (VENCLEXTA STARTING PACK) 10 mg-50 mg- 100 mg tablet Take two 10mg  tabs qday x7; one 50mg  tab qday x7; one 100mg  tab qday x7; two 100mg  tabs qday x7. 1 Package 0     No current facility-administered medications for this visit.      Physical exam:  Vitals:    12/04/17 1219   BP: 153/67   Pulse: 80   Resp: 18   Temp: 36.4 ??C (97.6 ??F)   SpO2: 98%     Constitutional: Resting, in no apparent distress  Eyes: PERRL. No scleral icterus or conjunctival injection.  Ear/nose/mouth/throat: Oral mucosa without ulceration, erythema or exudate.   Hematology/lymphatic/immunologic:  No lymphadenopathy in the anterior/posterior cervical, supraclavicular basins.  Cardiovascular:  RRR.  S1, S2.  No murmurs, gallops or rubs. Appear well-perfused.  No clubbing, edema or cyanosis.  Respiratory:  Breathing is unlabored, and patient is speaking full sentences with ease.  No stridor.  CTAB. No rales, ronchi or crackles.    GI:  No distention or pain on palpation.  Bowel sounds are present and normal in quality.  No palpable hepatomegaly or splenomegaly.  No palpable masses.  GU: not examined  Musculoskeletal: +point tenderness on palpation of right posterior thorax. No pain on palpation of the spinous processes of the cervical, thoracic or lumbar vertebral bodies. No  grossly-evident joint effusions or deformities.  Range of motion about the shoulder, elbow, hips and knees is grossly normal.    Skin:  No rashes, petechiae or purpura.  No areas of skin breakdown. Warm to touch, dry, smooth and even.  Neurologic:  Gait is normal.  Cerebellar tasks are completed with ease and are symmetric.  Psychiatric:  Alert and oriented to person, place, time and situation.  Range of affect is appropriate.      ECOG Performance Status: 1    Results:  Labs and pathology have been reviewed and pertinent results are as follows:    Results for orders placed or performed in visit on 12/04/17   Comprehensive Metabolic Panel   Result Value Ref Range    Sodium 133 (L) 135 - 145 mmol/L    Potassium 4.4 3.5 - 5.0 mmol/L    Chloride 106 98 - 107 mmol/L    CO2 22.0 22.0 - 30.0 mmol/L    Anion Gap 5 (L) 9 - 15 mmol/L    BUN 20 7 - 21 mg/dL    Creatinine 1.61 0.96 - 1.00 mg/dL    BUN/Creatinine Ratio 22     EGFR CKD-EPI Non-African American, Female 62 >=60 mL/min/1.5m2    EGFR CKD-EPI African American, Female 66 >=60 mL/min/1.45m2    Glucose 112 65 - 179 mg/dL    Calcium 8.3 (L) 8.5 - 10.2 mg/dL    Albumin 3.3 (L) 3.5 - 5.0 g/dL    Total Protein 5.6 (L) 6.5 - 8.3 g/dL    Total Bilirubin 1.0 0.0 - 1.2 mg/dL    AST 20 14 - 38 U/L    ALT 24 15 - 48 U/L    Alkaline Phosphatase 76 38 - 126 U/L   Uric acid   Result Value Ref Range    Uric Acid 4.5 3.0 - 6.5 mg/dL   Lactate dehydrogenase   Result Value Ref Range    LDH 357 338 - 610 U/L   Phosphorus Level   Result Value Ref Range    Phosphorus 3.6 2.9 - 4.7 mg/dL   Type and Screen   Result Value Ref Range    ABO Grouping B NEG     Antibody Screen NEG    CBC w/ Differential   Result Value Ref Range    WBC 2.9 (L) 4.5 - 11.0 10*9/L    RBC 3.89 (L) 4.00 - 5.20 10*12/L    HGB 11.6 (L) 12.0 - 16.0 g/dL    HCT 04.5 (L) 40.9 - 46.0 %    MCV 87.3 80.0 - 100.0 fL    MCH 29.7 26.0 - 34.0 pg    MCHC 34.0 31.0 - 37.0 g/dL    RDW 81.1 (H) 91.4 - 15.0 %    MPV 9.1 7.0 - 10.0 fL    Platelet 44 (L) 150 - 440 10*9/L    Variable HGB Concentration Slight (A) Not Present    Neutrophils % 44.5 %    Lymphocytes % 41.6 %    Monocytes % 10.1 %    Eosinophils % 0.6 %    Basophils % 0.7 %    Absolute Neutrophils 1.3 (L) 2.0 - 7.5 10*9/L    Absolute Lymphocytes 1.2 (L) 1.5 - 5.0 10*9/L    Absolute Monocytes 0.3 0.2 - 0.8 10*9/L    Absolute Eosinophils 0.0 0.0 - 0.4 10*9/L    Absolute Basophils 0.0 0.0 - 0.1 10*9/L    Large Unstained Cells 2 0 - 4 %  Microcytosis Slight (A) Not Present Anisocytosis Slight (A) Not Present

## 2017-12-08 NOTE — Unmapped (Signed)
Hem/Onc Phone Triage Note    Patient called to report that she only has 2 days worth of venetoclax pills left. She wanted to know when her next delivery will be. I advised her that there appears to have been an attempt to reach her by the specialty pharmacy on 12/06/17. I am not able to trouble shoot this with the specialty pharmacy over the weekend. I will notify her oncologists to ensure she has her next doses delivered in time to minimize interruption of her dosing. I asked her to call the triage line again if she does not hear back by mid day on Monday.     Fellow Taking Call:  Bertram Millard [facility]  December 08, 2017 11:39 AM

## 2017-12-10 ENCOUNTER — Ambulatory Visit: Admit: 2017-12-10 | Discharge: 2017-12-11 | Payer: MEDICARE

## 2017-12-10 ENCOUNTER — Ambulatory Visit: Admit: 2017-12-10 | Discharge: 2017-12-11 | Payer: MEDICARE | Attending: Adult Health | Primary: Adult Health

## 2017-12-10 ENCOUNTER — Other Ambulatory Visit: Admit: 2017-12-10 | Discharge: 2017-12-11 | Payer: MEDICARE

## 2017-12-10 DIAGNOSIS — C9110 Chronic lymphocytic leukemia of B-cell type not having achieved remission: Secondary | ICD-10-CM

## 2017-12-10 DIAGNOSIS — W19XXXA Unspecified fall, initial encounter: Principal | ICD-10-CM

## 2017-12-10 LAB — COMPREHENSIVE METABOLIC PANEL
ALBUMIN: 3.6 g/dL (ref 3.5–5.0)
ALKALINE PHOSPHATASE: 85 U/L (ref 38–126)
ALT (SGPT): 16 U/L (ref 15–48)
ANION GAP: 7 mmol/L — ABNORMAL LOW (ref 9–15)
AST (SGOT): 18 U/L (ref 14–38)
BILIRUBIN TOTAL: 0.9 mg/dL (ref 0.0–1.2)
BLOOD UREA NITROGEN: 19 mg/dL (ref 7–21)
BUN / CREAT RATIO: 19
CALCIUM: 8.9 mg/dL (ref 8.5–10.2)
CHLORIDE: 107 mmol/L (ref 98–107)
CO2: 21 mmol/L — ABNORMAL LOW (ref 22.0–30.0)
CREATININE: 1 mg/dL (ref 0.60–1.00)
EGFR CKD-EPI NON-AA FEMALE: 54 mL/min/{1.73_m2} — ABNORMAL LOW (ref >=60)
GLUCOSE RANDOM: 119 mg/dL (ref 65–179)
POTASSIUM: 3.4 mmol/L — ABNORMAL LOW (ref 3.5–5.0)
SODIUM: 135 mmol/L (ref 135–145)

## 2017-12-10 LAB — CBC W/ AUTO DIFF
BASOPHILS ABSOLUTE COUNT: 0 10*9/L (ref 0.0–0.1)
BASOPHILS RELATIVE PERCENT: 0.2 %
EOSINOPHILS ABSOLUTE COUNT: 0 10*9/L (ref 0.0–0.4)
EOSINOPHILS RELATIVE PERCENT: 0.5 %
HEMATOCRIT: 36.3 % (ref 36.0–46.0)
HEMOGLOBIN: 12.4 g/dL (ref 12.0–16.0)
LARGE UNSTAINED CELLS: 2 % (ref 0–4)
LYMPHOCYTES ABSOLUTE COUNT: 1.1 10*9/L — ABNORMAL LOW (ref 1.5–5.0)
LYMPHOCYTES RELATIVE PERCENT: 27.3 %
MEAN CORPUSCULAR VOLUME: 86.7 fL (ref 80.0–100.0)
MEAN PLATELET VOLUME: 8.6 fL (ref 7.0–10.0)
MONOCYTES ABSOLUTE COUNT: 0.2 10*9/L (ref 0.2–0.8)
MONOCYTES RELATIVE PERCENT: 5.9 %
NEUTROPHILS ABSOLUTE COUNT: 2.5 10*9/L (ref 2.0–7.5)
NEUTROPHILS RELATIVE PERCENT: 63.9 %
PLATELET COUNT: 78 10*9/L — ABNORMAL LOW (ref 150–440)
RED BLOOD CELL COUNT: 4.19 10*12/L (ref 4.00–5.20)
WBC ADJUSTED: 3.8 10*9/L — ABNORMAL LOW (ref 4.5–11.0)

## 2017-12-10 LAB — PHOSPHORUS: Phosphate:MCnc:Pt:Ser/Plas:Qn:: 4

## 2017-12-10 LAB — LACTATE DEHYDROGENASE: Lactate dehydrogenase:CCnc:Pt:Ser/Plas:Qn:: 404

## 2017-12-10 LAB — CO2: Carbon dioxide:SCnc:Pt:Ser/Plas:Qn:: 21 — ABNORMAL LOW

## 2017-12-10 LAB — URIC ACID
URIC ACID: 4.4 mg/dL (ref 3.0–6.5)
Urate:MCnc:Pt:Ser/Plas:Qn:: 4.4

## 2017-12-10 LAB — LARGE UNSTAINED CELLS: Lab: 2

## 2017-12-10 MED FILL — VENCLEXTA 100 MG TABLET: 30 days supply | Qty: 120 | Fill #0 | Status: AC

## 2017-12-10 NOTE — Unmapped (Signed)
Labs drawn and sent.  Care provided by Noel Christmas, RN.

## 2017-12-10 NOTE — Unmapped (Addendum)
- go to x[facility] today after you check out[facility]    - Increase your Venetoclax to 400mg  (4 tablets) tomorrow when you receive your medication in the mail.    - Start taking 1/2 a tablet of oxycodone and 2 tylenol.  This should help your pain without taking the full dose of oxycodone.  If you feel okay with 1/2 tablet, you can increase to a whole tablet.      - Return next week for your infusion.  We'll cancel appointments for tomorrow.    If you have any questions, please do not hesitate to contact us.    Please contact us for any of these symptoms:    **Fevers over 100.4 are an EMERGENCY. Please go to the nearest ER**    1. New bone pain  2. Unintentional weight loss  3. Unexplained fatigue  4. Swelling of your legs  5. Yellowing of the skin or eyes    When reviewing your results, please remember that the results of many of the tests we order can vary somewhat and that variation often means nothing.  Sometimes when we get results back after your clinic visit, if it looks like there???s some variation of that type, we may decide to recheck things sooner than we discussed in clinic.  If you get a call that we want to recheck things sooner, do not panic. It does not mean that things are going wrong.    For appointments & questions Monday through Friday 8 AM??? 5 PM   please call [phone] or Toll free [phone].    On Nights, Weekends and Holidays  Call [phone] and ask for the adult hematologist/oncologist on call.    Markus Jarvis, AGPCNP-C, MSN, OCN  Nurse Practitioner  Hematologic Malignancies  [facility]    Nurse Navigator: Frankey Poot, RN  Questions and appointments M-F 8am - 5pm: [phone] or [phone]    [facility]  [address]  [place], [place] [address]  [email or web address]    Results for orders placed or performed in visit on 12/10/17   Comprehensive Metabolic Panel   Result Value Ref Range    Sodium 135 135 - 145 mmol/L    Potassium 3.4 (L) 3.5 - 5.0 mmol/L Chloride 107 98 - 107 mmol/L    CO2 21.0 (L) 22.0 - 30.0 mmol/L    Anion Gap 7 (L) 9 - 15 mmol/L    BUN 19 7 - 21 mg/dL    Creatinine 1.09 3.23 - 1.00 mg/dL    BUN/Creatinine Ratio 19     EGFR CKD-EPI Non-African American, Female 54 (L) >=60 mL/min/1.58m2    EGFR CKD-EPI African American, Female 84 >=60 mL/min/1.23m2    Glucose 119 65 - 179 mg/dL    Calcium 8.9 8.5 - 55.7 mg/dL    Albumin 3.6 3.5 - 5.0 g/dL    Total Protein 6.2 (L) 6.5 - 8.3 g/dL    Total Bilirubin 0.9 0.0 - 1.2 mg/dL    AST 18 14 - 38 U/L    ALT 16 15 - 48 U/L    Alkaline Phosphatase 85 38 - 126 U/L   Uric acid   Result Value Ref Range    Uric Acid 4.4 3.0 - 6.5 mg/dL   Lactate dehydrogenase   Result Value Ref Range    LDH 404 338 - 610 U/L   Phosphorus Level   Result Value Ref Range    Phosphorus 4.0 2.9 - 4.7 mg/dL  Type and Screen   Result Value Ref Range    ABO Grouping B NEG    CBC w/ Differential   Result Value Ref Range    WBC 3.8 (L) 4.5 - 11.0 10*9/L    RBC 4.19 4.00 - 5.20 10*12/L    HGB 12.4 12.0 - 16.0 g/dL    HCT 16.1 09.6 - 04.5 %    MCV 86.7 80.0 - 100.0 fL    MCH 29.7 26.0 - 34.0 pg    MCHC 34.2 31.0 - 37.0 g/dL    RDW 40.9 (H) 81.1 - 15.0 %    MPV 8.6 7.0 - 10.0 fL    Platelet 78 (L) 150 - 440 10*9/L    Variable HGB Concentration Slight (A) Not Present    Neutrophils % 63.9 %    Lymphocytes % 27.3 %    Monocytes % 5.9 %    Eosinophils % 0.5 %    Basophils % 0.2 %    Absolute Neutrophils 2.5 2.0 - 7.5 10*9/L    Absolute Lymphocytes 1.1 (L) 1.5 - 5.0 10*9/L    Absolute Monocytes 0.2 0.2 - 0.8 10*9/L    Absolute Eosinophils 0.0 0.0 - 0.4 10*9/L    Absolute Basophils 0.0 0.0 - 0.1 10*9/L    Large Unstained Cells 2 0 - 4 %    Microcytosis Slight (A) Not Present    Anisocytosis Slight (A) Not Present

## 2017-12-10 NOTE — Unmapped (Signed)
[facility] Refill and Clinical Coordination Note  Medication(s): Venclexta 100mg     [name], DOB: 03-20-1941  Phone: [phone] (home) , Alternate phone contact: N/A  Shipping address: [address]  [place] [address]  Phone or address changes today?: No  All above HIPAA information verified.  Insurance changes? No    Completed refill and clinical call assessment today to schedule patient's medication shipment from the [facility] [phone]).      MEDICATION RECONCILIATION    Confirmed the medication and dosage are correct and have not changed: Patient is no begining maintenance dosing of 400mg  po daily.    Were there any changes to your medication(s) in the past month:  No, there are no changes reported at this time.    ADHERENCE    Is this medicine transplant or covered by Medicare Part B? No.      Did you miss any doses in the past 4 weeks? No missed doses reported.  Adherence counseling provided? Not needed     SIDE EFFECT MANAGEMENT    Are you tolerating your medication?:  [name] reports tolerating the medication.  Side effect management discussed: None      Therapy is appropriate and should be continued.    Evidence of clinical benefit: See Epic note from 12/10/2017      FINANCIAL/SHIPPING    Delivery Scheduled: Yes, Expected medication delivery date: 12/11/2017 via next day courier.     Additional medications refilled: No additional medications/refills needed at this time.    The patient will receive a drug information handout for each medication shipped and additional FDA Medication Guides as required.      [name] did not have any additional questions at this time.    Delivery address confirmed in Epic.     We will follow up with patient monthly for standard refill processing and delivery.      Thank you,  Kymiah Araiza  Anders Grant   [facility] Specialty Pharmacist

## 2017-12-10 NOTE — Unmapped (Signed)
[facility] Follow Up       Patient Name: Katrina Gould  Patient Age: 77 y.o.  Encounter Date: 12/10/2017      Chief complaint/Reason for visit: CLL/SLL    Assessment:  Katrina Gould is a 77 y.o. female who presents for follow of her CLL/SLL, Rai stage 4. Disease markers: 46,XX,der(4)t(4;4)(p14;q21). IGHV unmutated (0%, 3-33*01). B49m 5.05. No TP53 mutation. High-risk by CLL-IPI (6 points).    Katrina Gould continues to do well on venetoclax.  She is tolerating it well, and is okay to do final ramp up to 400mg  daily.  She has not yet received her script for 400mg  and only has 2 tablets left.  I have messaged the covering pharmacist to contact [facility] ASAP and have this mailed today for delivery tomorrow.  She will start 400mg  tomorrow.  Labs are appropriate.    Continues to have right flank pain, still likely MSK in nature though currently has new pain from her recent fall.  Will do imaging of the chest and ribs to eval for fracture s/p fall.  She is scared to take oxycodone due to addictive nature, though did review with her the rationale for taking it while in pain and those who don't take it and aren't in pain.  Discussed maybe trying half a tablet plus 2 tylenol.    Plans and Recommendations:  1. CLL, Rai 4   - Increase venetoclax to 400 mg daily  - RTC in one week for Cycle 3 obinutuzumab  - continue allopurinol    2. Health Maintenance  - received Influenza vaccination this past season  - continue annual dermatology follow up  - still needs 2nd shingrix vaccine    3. Recent fall  - CXR and rib xray to evaluate for rib fractures  - point tenderness at right lower rib  - 1/2 oxycodone tab plus 2 tylenol    4. Hoarseness  - seeing ENT in August    5. Headache  - improved; now on topamax      Due to this patient's diagnosis, she is at significant risk for subsequent morbidity and/or mortality.    Dr. Lonni Fix was available.    Markus Jarvis, AGPCNP-C, MSN, OCN  Nurse Practitioner  Hematologic Malignancies  [facility]  [phone] (phone)  [phone] (fax)  [email or web address]        Interval History:  She fell last Wednesday while gardening and landed on her backside.  She fell on a bush and then on a planter.  She has a bruise on her back, but this pain refers to the front.  She was able to get up on her own.  Has a sharp pain in her abdomen and under right right rib.  Used lidocaine patch, but couldn't notice a difference.   Denies intercurrent infection. Denies fevers. Denies unexplained bleeding.  Reports poor appetite.  Intermittent loose stools.  Has not gotten in touch with [facility] for Venetoclax refill.    Denies headache. Denies CP. Denies SOB/cough. Denies n/v/d/c. Denies LE edema. Denies rash.    Otherwise, she denies new constitutional symptoms such as anorexia, weight loss, drenching night sweats or unexplained fevers.  Furthermore, she denies symptoms of marrow failure:  recurrent or unexplained intercurrent infections.  There have been no new or unexplained pains or self-identified masses, swelling or enlarged lymph nodes.    Past Medical, Surgical and Family History were reviewed and pertinent updates were made in the Electronic Medical Record  ??  Review of Systems:  Other than as reported above in the interim history, the other systems reviewed were unremarkable.  ??  ECOG Performance Status: 1     Oncology History:    Oncology History    CLL    First seen in oncology clinic locally 03/31/16    Had a left axillary biopsy 03/15/16 that confirmed CLL/SLL: Flow showed monoclonal B-cell population with CD5, CD23, kappa light chain. IHC showed positive CD20 (diffuse), negative for cyclin D1.    No constitutional symptoms except some sweats    Labs: Hb 12.0, Hct 34.8, plt 147. WBC was 10.6 with ANC 2.5. ALC was 7.5K.    CLL FISH studies: normal panel. No cells with 11q, tri 12, 17p or 13q, and the 11;14 translocation also not detected.    CT c/a/p 04/04/16 showed mild LAD in chest, abd, and pelvis and spleen was at ULN (13 x 5.6 x 10.9 cm). Largest node 1.6 cm.    Of note is that she had a prior CT on 04/06/15 that showed similarly small enlarged node, largest being 1.9 cm.    CLL-IPI: As per Lancet Oncol Vol 15 October 2014  Includes (1) TP53 status (no abnormalities vs. Del 17p and/or TP53 mutation - 4 points) - no 17p (did not send TP53 mutation testing), (2) IGHV mutation status (mut vs unmut - 2 points) (pending) (3) serum B2-microglobulin (</=3.5 vs >3.5 - 2 points) (gets 2 points here) (4) clinical stage (Rai 0 vs Rai I-IV - 1 point) (gets 1 point here) and (5) age (</=65 vs >65 - 1 point)  (gets 1 point here)     Low risk (0-1 points)= 93.2% OS at 5 years  Int risk (2-3 points)= 79.3%  High risk (4-6 points)= 63.3%  - this patient has 4 points already, pending IGHV testing and prior to treatment would also need to send TP53 mutation testing  Very high risk (7-10 points)= 23.3%               CLL (chronic lymphocytic leukemia) (CMS-HCC)    05/16/2016 Initial Diagnosis     CLL (chronic lymphocytic leukemia) (RAF-HCC)        Chronic lymphocytic leukemia, Rai stage IV (CMS-HCC)    09/05/2017 - 10/11/2017 Chemotherapy     Chemotherapy Treatment    Treatment Goal Control   Line of Treatment [No plan line of treatment]   Plan Name OP RITUXIMAB    Start Date 09/14/2017   End Date 10/05/2017   Provider Pernell Dupre, MD   Chemotherapy riTUXimab (RITUXAN) 712.5 mg in sodium chloride (NS) 0.9 % 500 mL IVPB, 375 mg/m2 = 712.5 mg, Intravenous, Once, 1 of 1 cycle  Administration: 712.5 mg (09/14/2017)  riTUXimab (RITUXAN) 712.5 mg in sodium chloride (NS) 0.9 % 250 mL rapid infusion, 375 mg/m2 = 712.5 mg, Intravenous, Once, 1 of 1 cycle  Administration: 712.5 mg (09/21/2017), 712.5 mg (09/28/2017), 712.5 mg (10/05/2017)         10/16/2017 Initial Diagnosis     Chronic lymphocytic leukemia, Rai stage IV (CMS-HCC)      10/23/2017 -  Chemotherapy     Chemotherapy Treatment    Treatment Goal Control   Line of Treatment [No plan line of treatment]   Plan Name OP OBINUTUZUMAB AND VENETOCLAX   Start Date 10/23/2017   End Date 03/12/2018 (Planned)   Provider Pernell Dupre, MD   Chemotherapy dexamethasone (DECADRON) tablet 20 mg, 20 mg, Oral, Once, 2 of 6 cycles  Administration:  20 mg (10/23/2017), 20 mg (10/24/2017), 20 mg (10/30/2017), 20 mg (11/06/2017), 20 mg (11/20/2017)  obinutuzumab 100 mg in sodium chloride (NS) 0.9 % 50 mL IVPB, 100 mg, Intravenous, Once, 2 of 6 cycles  Administration: 100 mg (10/23/2017), 900 mg (10/24/2017), 1,000 mg (10/30/2017), 1,000 mg (11/06/2017), 1,000 mg (11/20/2017)          Social history:  Here with son  Lives alone  Husband died in 21-Oct-2008    Allergies   Allergen Reactions   ??? Sertraline Other (See Comments)     Nervousness, panic, shortness of breath   ??? Topiramate Palpitations     Panic attacks; unable to sleep   ??? Baclofen      made me go crazy         Current Outpatient Medications   Medication Sig Dispense Refill   ??? allopurinol (ZYLOPRIM) 300 MG tablet Take 1 tablet (300 mg total) by mouth daily. 30 tablet 2   ??? fesoterodine (TOVIAZ) 4 mg 24 hr tablet Take by mouth.     ??? fluticasone propion-salmeterol (ADVAIR) 100-50 mcg/dose diskus Inhale.     ??? lidocaine (LIDODERM) 5 % patch Place 1 patch on the skin daily. Apply to affected area for 12 hours only each day (then remove patch) 5 patch 0   ??? LORazepam (ATIVAN) 0.5 MG tablet Take 0.5 mg by mouth two (2) times a day as needed.      ??? mirtazapine (REMERON) 7.5 MG tablet Take 7.5 mg by mouth nightly.     ??? multivitamin (TAB-A-VITE/THERAGRAN) per tablet Take 1 tablet by mouth daily.      ??? oxyCODONE (ROXICODONE) 5 MG immediate release tablet Take 1 tablet (5 mg total) by mouth every eight (8) hours as needed for pain. 20 tablet 0   ??? pantoprazole (PROTONIX) 40 MG tablet Take by mouth daily at 0600.      ??? pravastatin (PRAVACHOL) 80 MG tablet TAKE 1 TABLET BY MOUTH EVERY NIGHT AT BEDTIME     ??? predniSONE (DELTASONE) 10 MG tablet 6 TABS X 1 DAY, 5 TABS X 1 DAY, 4 TABS X 1 DAY, ETC...  0   ??? sertraline (ZOLOFT) 50 MG tablet Take 50 mg by mouth daily.  1   ??? topiramate (TOPAMAX) 50 MG tablet Take 50 mg by mouth daily.  3   ??? venetoclax (VENCLEXTA STARTING PACK) 10 mg-50 mg- 100 mg tablet Take two 10mg  tabs qday x7; one 50mg  tab qday x7; one 100mg  tab qday x7; two 100mg  tabs qday x7. 1 Package 0   ??? venetoclax (VENCLEXTA) 100 mg tablet TAKE 4 TABLETS BY MOUTH ONCE DAILY WITH A MEAL AND WATER - DO NOT CHEW CRUSH OR BREAK TABLETS 120 each 6     No current facility-administered medications for this visit.      Facility-Administered Medications Ordered in Other Visits   Medication Dose Route Frequency Provider Last Rate Last Dose   ??? heparin, porcine (PF) 100 unit/mL injection 500 Units  500 Units Intravenous Q30 Min PRN Pernell Dupre, MD   500 Units at 12/10/17 0915     Physical exam:  There were no vitals filed for this visit.  Constitutional: Resting, in no apparent distress  Eyes: PERRL. No scleral icterus or conjunctival injection.  Ear/nose/mouth/throat: Oral mucosa without ulceration, erythema or exudate.   Hematology/lymphatic/immunologic:  No lymphadenopathy in the anterior/posterior cervical, supraclavicular basins.  Cardiovascular:  RRR.  S1, S2.  No murmurs, gallops or rubs. Appear well-perfused.  No clubbing,  edema or cyanosis.  Respiratory:  Breathing is unlabored, and patient is speaking full sentences with ease.  No stridor.  CTAB. No rales, ronchi or crackles.    GI:  No distention or pain on palpation.  Bowel sounds are present and normal in quality.  No palpable hepatomegaly or splenomegaly.  No palpable masses.  GU: not examined  Musculoskeletal: +point tenderness on palpation of right posterior thorax and right lower rib. No pain on palpation of the spinous processes of the cervical, thoracic or lumbar vertebral bodies. No grossly-evident joint effusions or deformities.  Range of motion about the shoulder, elbow, hips and knees is grossly normal.    Skin:  No rashes, petechiae or purpura.  No areas of skin breakdown. Warm to touch, dry, smooth and even.  Neurologic:  Gait is normal.  Cerebellar tasks are completed with ease and are symmetric.  Psychiatric:  Alert and oriented to person, place, time and situation.  Range of affect is appropriate.      ECOG Performance Status: 1    Results:  Labs and pathology have been reviewed and pertinent results are as follows:    Results for orders placed or performed in visit on 12/04/17   Comprehensive Metabolic Panel   Result Value Ref Range    Sodium 133 (L) 135 - 145 mmol/L    Potassium 4.4 3.5 - 5.0 mmol/L    Chloride 106 98 - 107 mmol/L    CO2 22.0 22.0 - 30.0 mmol/L    Anion Gap 5 (L) 9 - 15 mmol/L    BUN 20 7 - 21 mg/dL    Creatinine 0.86 5.78 - 1.00 mg/dL    BUN/Creatinine Ratio 22     EGFR CKD-EPI Non-African American, Female 62 >=60 mL/min/1.16m2    EGFR CKD-EPI African American, Female 77 >=60 mL/min/1.45m2    Glucose 112 65 - 179 mg/dL    Calcium 8.3 (L) 8.5 - 10.2 mg/dL    Albumin 3.3 (L) 3.5 - 5.0 g/dL    Total Protein 5.6 (L) 6.5 - 8.3 g/dL    Total Bilirubin 1.0 0.0 - 1.2 mg/dL    AST 20 14 - 38 U/L    ALT 24 15 - 48 U/L    Alkaline Phosphatase 76 38 - 126 U/L   Uric acid   Result Value Ref Range    Uric Acid 4.5 3.0 - 6.5 mg/dL   Lactate dehydrogenase   Result Value Ref Range    LDH 357 338 - 610 U/L   Phosphorus Level   Result Value Ref Range    Phosphorus 3.6 2.9 - 4.7 mg/dL   Type and Screen   Result Value Ref Range    ABO Grouping B NEG     Antibody Screen NEG    CBC w/ Differential   Result Value Ref Range    WBC 2.9 (L) 4.5 - 11.0 10*9/L    RBC 3.89 (L) 4.00 - 5.20 10*12/L    HGB 11.6 (L) 12.0 - 16.0 g/dL    HCT 46.9 (L) 62.9 - 46.0 %    MCV 87.3 80.0 - 100.0 fL    MCH 29.7 26.0 - 34.0 pg    MCHC 34.0 31.0 - 37.0 g/dL    RDW 52.8 (H) 41.3 - 15.0 %    MPV 9.1 7.0 - 10.0 fL    Platelet 44 (L) 150 - 440 10*9/L    Variable HGB Concentration Slight (A) Not Present    Neutrophils % 44.5 %  Lymphocytes % 41.6 %    Monocytes % 10.1 %    Eosinophils % 0.6 %    Basophils % 0.7 %    Absolute Neutrophils 1.3 (L) 2.0 - 7.5 10*9/L    Absolute Lymphocytes 1.2 (L) 1.5 - 5.0 10*9/L    Absolute Monocytes 0.3 0.2 - 0.8 10*9/L    Absolute Eosinophils 0.0 0.0 - 0.4 10*9/L    Absolute Basophils 0.0 0.0 - 0.1 10*9/L    Large Unstained Cells 2 0 - 4 %    Microcytosis Slight (A) Not Present    Anisocytosis Slight (A) Not Present

## 2017-12-18 ENCOUNTER — Ambulatory Visit: Admit: 2017-12-18 | Discharge: 2017-12-19 | Payer: MEDICARE

## 2017-12-18 ENCOUNTER — Other Ambulatory Visit: Admit: 2017-12-18 | Discharge: 2017-12-19 | Payer: MEDICARE

## 2017-12-18 ENCOUNTER — Ambulatory Visit: Admit: 2017-12-18 | Discharge: 2017-12-19 | Payer: MEDICARE | Attending: Adult Health | Primary: Adult Health

## 2017-12-18 DIAGNOSIS — D591 Other autoimmune hemolytic anemias: Secondary | ICD-10-CM

## 2017-12-18 DIAGNOSIS — R1011 Right upper quadrant pain: Secondary | ICD-10-CM

## 2017-12-18 DIAGNOSIS — C9110 Chronic lymphocytic leukemia of B-cell type not having achieved remission: Principal | ICD-10-CM

## 2017-12-18 LAB — CBC W/ AUTO DIFF
BASOPHILS ABSOLUTE COUNT: 0 10*9/L (ref 0.0–0.1)
BASOPHILS RELATIVE PERCENT: 0.4 %
EOSINOPHILS ABSOLUTE COUNT: 0 10*9/L (ref 0.0–0.4)
EOSINOPHILS RELATIVE PERCENT: 0.1 %
HEMATOCRIT: 34.4 % — ABNORMAL LOW (ref 36.0–46.0)
HEMOGLOBIN: 11.9 g/dL — ABNORMAL LOW (ref 12.0–16.0)
LARGE UNSTAINED CELLS: 2 % (ref 0–4)
LYMPHOCYTES RELATIVE PERCENT: 34.8 %
MEAN CORPUSCULAR HEMOGLOBIN CONC: 34.5 g/dL (ref 31.0–37.0)
MEAN CORPUSCULAR HEMOGLOBIN: 30.1 pg (ref 26.0–34.0)
MEAN CORPUSCULAR VOLUME: 87.4 fL (ref 80.0–100.0)
MEAN PLATELET VOLUME: 8.5 fL (ref 7.0–10.0)
MONOCYTES ABSOLUTE COUNT: 0.2 10*9/L (ref 0.2–0.8)
MONOCYTES RELATIVE PERCENT: 5.7 %
NEUTROPHILS ABSOLUTE COUNT: 2 10*9/L (ref 2.0–7.5)
NEUTROPHILS RELATIVE PERCENT: 57 %
PLATELET COUNT: 69 10*9/L — ABNORMAL LOW (ref 150–440)
RED BLOOD CELL COUNT: 3.94 10*12/L — ABNORMAL LOW (ref 4.00–5.20)
RED CELL DISTRIBUTION WIDTH: 17.6 % — ABNORMAL HIGH (ref 12.0–15.0)
WBC ADJUSTED: 3.4 10*9/L — ABNORMAL LOW (ref 4.5–11.0)

## 2017-12-18 LAB — COMPREHENSIVE METABOLIC PANEL
ALBUMIN: 3.6 g/dL (ref 3.5–5.0)
ALKALINE PHOSPHATASE: 95 U/L (ref 38–126)
ALT (SGPT): 21 U/L (ref 15–48)
AST (SGOT): 17 U/L (ref 14–38)
BILIRUBIN TOTAL: 0.6 mg/dL (ref 0.0–1.2)
BLOOD UREA NITROGEN: 11 mg/dL (ref 7–21)
BUN / CREAT RATIO: 13
CALCIUM: 9.2 mg/dL (ref 8.5–10.2)
CHLORIDE: 108 mmol/L — ABNORMAL HIGH (ref 98–107)
CO2: 22 mmol/L (ref 22.0–30.0)
CREATININE: 0.83 mg/dL (ref 0.60–1.00)
EGFR CKD-EPI AA FEMALE: 78 mL/min/{1.73_m2} (ref >=60)
EGFR CKD-EPI NON-AA FEMALE: 67 mL/min/{1.73_m2} (ref >=60)
GLUCOSE RANDOM: 92 mg/dL (ref 65–179)
POTASSIUM: 3.8 mmol/L (ref 3.5–5.0)
PROTEIN TOTAL: 6 g/dL — ABNORMAL LOW (ref 6.5–8.3)
SODIUM: 137 mmol/L (ref 135–145)

## 2017-12-18 LAB — ANION GAP: Anion gap 3:SCnc:Pt:Ser/Plas:Qn:: 7 — ABNORMAL LOW

## 2017-12-18 LAB — MONOCYTES ABSOLUTE COUNT: Lab: 0.2

## 2017-12-18 NOTE — Unmapped (Signed)
Patient labs drawn and sent for analysis. Care per H Birkhead

## 2017-12-18 NOTE — Unmapped (Signed)
[facility] Follow Up       Patient Name: Katrina Gould  Patient Age: 77 y.o.  Encounter Date: 12/18/2017      Chief complaint/Reason for visit: CLL/SLL    Assessment:  Katrina Gould is a 77 y.o. female who presents for follow of her CLL/SLL, Rai stage 4. Disease markers: 46,XX,der(4)t(4;4)(p14;q21). IGHV unmutated (0%, 3-33*01). B53m 5.05. No TP53 mutation. High-risk by CLL-IPI (6 points).    [name] is here for C3D1 obinituzumab.  She has tolerated ramp up to 400mg  of venetoclax well, without evidence of TLS.      Biggest complaint today is right sided upper abdominal pain, right below the rib.  Her son feels that it's gallbladder related.  It's been present since her fall 2 weeks ago.  On exam, Murphy's sign is negative.  Labs are normal.  CT done day of fall was negative for cholecystitis or cholelithiasis.  Likely related to her fall and the broken rib.  Unfortunately, not much for [place] to do.    Plans and Recommendations:  1. CLL, Rai 4   - Continue venetoclax 400 mg daily  - RTC in one month for Cycle 4 obinutuzumab    2. Health Maintenance  - received Influenza vaccination this past season  - continue annual dermatology follow up  - still needs 2nd shingrix vaccine    3. Rib/abdominal pain  - likely 2/2 fall and broken rib  - Murphy's sign negative and LFTs negative for gallbladder issues  - recent CT (after fall) was negative for cholecystitis    4. Hoarseness  - seeing ENT in August    5. Headache  - improved; now on topamax      Due to this patient's diagnosis, she is at significant risk for subsequent morbidity and/or mortality.      Markus Jarvis, AGPCNP-C, MSN, OCN  Nurse Practitioner  Hematologic Malignancies  [facility]  [phone] (phone)  [phone] (fax)  [email or web address]        Interval History:  Doing fair today.  Continues to have pain from when she fell a few weeks ago.  Still has some pain under her right sternum.  Reports poor appetite. Intermittent loose stools.     Denies headache. Denies CP. Denies SOB/cough. Denies n/v/d/c. Denies LE edema. Denies rash.    Otherwise, she denies new constitutional symptoms such as anorexia, weight loss, drenching night sweats or unexplained fevers.  Furthermore, she denies symptoms of marrow failure:  recurrent or unexplained intercurrent infections.  There have been no new or unexplained pains or self-identified masses, swelling or enlarged lymph nodes.    Past Medical, Surgical and Family History were reviewed and pertinent updates were made in the Electronic Medical Record  ??  Review of Systems:  Other than as reported above in the interim history, the other systems reviewed were unremarkable.  ??  ECOG Performance Status: 1     Oncology History:    Oncology History    CLL    First seen in oncology clinic locally 03/31/16    Had a left axillary biopsy 03/15/16 that confirmed CLL/SLL: Flow showed monoclonal B-cell population with CD5, CD23, kappa light chain. IHC showed positive CD20 (diffuse), negative for cyclin D1.    No constitutional symptoms except some sweats    Labs: Hb 12.0, Hct 34.8, plt 147. WBC was 10.6 with ANC 2.5. ALC was 7.5K.    CLL FISH studies: normal panel. No cells with 11q, tri 12, 17p or  13q, and the 11;14 translocation also not detected.    CT c/a/p 04/04/16 showed mild LAD in chest, abd, and pelvis and spleen was at ULN (13 x 5.6 x 10.9 cm). Largest node 1.6 cm.    Of note is that she had a prior CT on 04/06/15 that showed similarly small enlarged node, largest being 1.9 cm.    CLL-IPI: As per Lancet Oncol Vol 15 October 2014  Includes (1) TP53 status (no abnormalities vs. Del 17p and/or TP53 mutation - 4 points) - no 17p (did not send TP53 mutation testing), (2) IGHV mutation status (mut vs unmut - 2 points) (pending) (3) serum B2-microglobulin (</=3.5 vs >3.5 - 2 points) (gets 2 points here) (4) clinical stage (Rai 0 vs Rai I-IV - 1 point) (gets 1 point here) and (5) age (</=65 vs >65 - 1 point)  (gets 1 point here)     Low risk (0-1 points)= 93.2% OS at 5 years  Int risk (2-3 points)= 79.3%  High risk (4-6 points)= 63.3%  - this patient has 4 points already, pending IGHV testing and prior to treatment would also need to send TP53 mutation testing  Very high risk (7-10 points)= 23.3%               CLL (chronic lymphocytic leukemia) (CMS-HCC)    05/16/2016 Initial Diagnosis     CLL (chronic lymphocytic leukemia) (RAF-HCC)        Chronic lymphocytic leukemia, Rai stage IV (CMS-HCC)    09/05/2017 - 10/11/2017 Chemotherapy     Chemotherapy Treatment    Treatment Goal Control   Line of Treatment [No plan line of treatment]   Plan Name OP RITUXIMAB    Start Date 09/14/2017   End Date 10/05/2017   Provider Pernell Dupre, MD   Chemotherapy riTUXimab (RITUXAN) 712.5 mg in sodium chloride (NS) 0.9 % 500 mL IVPB, 375 mg/m2 = 712.5 mg, Intravenous, Once, 1 of 1 cycle  Administration: 712.5 mg (09/14/2017)  riTUXimab (RITUXAN) 712.5 mg in sodium chloride (NS) 0.9 % 250 mL rapid infusion, 375 mg/m2 = 712.5 mg, Intravenous, Once, 1 of 1 cycle  Administration: 712.5 mg (09/21/2017), 712.5 mg (09/28/2017), 712.5 mg (10/05/2017)         10/16/2017 Initial Diagnosis     Chronic lymphocytic leukemia, Rai stage IV (CMS-HCC)      10/23/2017 -  Chemotherapy     Chemotherapy Treatment    Treatment Goal Control   Line of Treatment [No plan line of treatment]   Plan Name OP OBINUTUZUMAB AND VENETOCLAX   Start Date 10/23/2017   End Date 03/12/2018 (Planned)   Provider Pernell Dupre, MD   Chemotherapy dexamethasone (DECADRON) tablet 20 mg, 20 mg, Oral, Once, 2 of 6 cycles  Administration: 20 mg (10/23/2017), 20 mg (10/24/2017), 20 mg (10/30/2017), 20 mg (11/06/2017), 20 mg (11/20/2017)  obinutuzumab 100 mg in sodium chloride (NS) 0.9 % 50 mL IVPB, 100 mg, Intravenous, Once, 2 of 6 cycles  Administration: 100 mg (10/23/2017), 900 mg (10/24/2017), 1,000 mg (10/30/2017), 1,000 mg (11/06/2017), 1,000 mg (11/20/2017)          Social history:  Here with son  Lives alone  Husband died in 2008-10-22    Allergies   Allergen Reactions   ??? Sertraline Other (See Comments)     Nervousness, panic, shortness of breath   ??? Topiramate Palpitations     Panic attacks; unable to sleep   ??? Baclofen      made me go crazy  Current Outpatient Medications   Medication Sig Dispense Refill   ??? allopurinol (ZYLOPRIM) 300 MG tablet Take 1 tablet (300 mg total) by mouth daily. 30 tablet 2   ??? fesoterodine (TOVIAZ) 4 mg 24 hr tablet Take by mouth.     ??? fluticasone propion-salmeterol (ADVAIR) 100-50 mcg/dose diskus Inhale.     ??? lidocaine (LIDODERM) 5 % patch Place 1 patch on the skin daily. Apply to affected area for 12 hours only each day (then remove patch) 5 patch 0   ??? LORazepam (ATIVAN) 0.5 MG tablet Take 0.5 mg by mouth two (2) times a day as needed.      ??? mirtazapine (REMERON) 7.5 MG tablet Take 7.5 mg by mouth nightly.     ??? multivitamin (TAB-A-VITE/THERAGRAN) per tablet Take 1 tablet by mouth daily.      ??? oxyCODONE (ROXICODONE) 5 MG immediate release tablet Take 1 tablet (5 mg total) by mouth every eight (8) hours as needed for pain. 20 tablet 0   ??? pantoprazole (PROTONIX) 40 MG tablet Take by mouth daily at 0600.      ??? pravastatin (PRAVACHOL) 80 MG tablet TAKE 1 TABLET BY MOUTH EVERY NIGHT AT BEDTIME     ??? predniSONE (DELTASONE) 10 MG tablet 6 TABS X 1 DAY, 5 TABS X 1 DAY, 4 TABS X 1 DAY, ETC...  0   ??? sertraline (ZOLOFT) 50 MG tablet Take 50 mg by mouth daily.  1   ??? topiramate (TOPAMAX) 50 MG tablet Take 50 mg by mouth daily.  3   ??? venetoclax (VENCLEXTA STARTING PACK) 10 mg-50 mg- 100 mg tablet TAKE BY MOUTH AS DIRECTED ON PACKAGE LABELING 42 each 0   ??? venetoclax (VENCLEXTA) 100 mg tablet TAKE 4 TABLETS BY MOUTH ONCE DAILY WITH A MEAL AND WATER - DO NOT CHEW CRUSH OR BREAK TABLETS 120 each 6     No current facility-administered medications for this visit.      Physical exam:  Vitals:    12/18/17 1302   BP: 159/69   Pulse: 74   Resp: 18   Temp: 36.4 ??C (97.5 ??F)   SpO2: 99%     Constitutional: Resting, in no apparent distress  Eyes: PERRL. No scleral icterus or conjunctival injection.  Ear/nose/mouth/throat: Oral mucosa without ulceration, erythema or exudate.   Hematology/lymphatic/immunologic:  No lymphadenopathy in the anterior/posterior cervical, supraclavicular basins.  Cardiovascular:  RRR.  S1, S2.  No murmurs, gallops or rubs. Appear well-perfused.  No clubbing, edema or cyanosis.  Respiratory:  Breathing is unlabored, and patient is speaking full sentences with ease.  No stridor.  CTAB. No rales, ronchi or crackles.    GI:  No distention or pain on palpation.  Bowel sounds are present and normal in quality.  No palpable hepatomegaly or splenomegaly.  No palpable masses.  No pain on palpation of RUQ. Murphy's sign negative.  GU: not examined  Musculoskeletal: +point tenderness on palpation of right posterior thorax and right lower rib. No pain on palpation of the spinous processes of the cervical, thoracic or lumbar vertebral bodies. No grossly-evident joint effusions or deformities.  Range of motion about the shoulder, elbow, hips and knees is grossly normal.    Skin:  No rashes, petechiae or purpura.  No areas of skin breakdown. Warm to touch, dry, smooth and even.  Neurologic:  Gait is normal.  Cerebellar tasks are completed with ease and are symmetric.  Psychiatric:  Alert and oriented to person, place, time and situation.  Range  of affect is appropriate.      ECOG Performance Status: 1    Results:  Labs and pathology have been reviewed and pertinent results are as follows:    Results for orders placed or performed in visit on 12/18/17   Comprehensive Metabolic Panel   Result Value Ref Range    Sodium 137 135 - 145 mmol/L    Potassium 3.8 3.5 - 5.0 mmol/L    Chloride 108 (H) 98 - 107 mmol/L    CO2 22.0 22.0 - 30.0 mmol/L    BUN 11 7 - 21 mg/dL    Creatinine 1.61 0.96 - 1.00 mg/dL    BUN/Creatinine Ratio 13     EGFR CKD-EPI Non-African American, Female 67 >=60 mL/min/1.77m2    EGFR CKD-EPI African American, Female 75 >=60 mL/min/1.41m2    Glucose 92 65 - 179 mg/dL    Calcium 9.2 8.5 - 04.5 mg/dL    Albumin 3.6 3.5 - 5.0 g/dL    Total Protein 6.0 (L) 6.5 - 8.3 g/dL    Total Bilirubin 0.6 0.0 - 1.2 mg/dL    AST 17 14 - 38 U/L    ALT 21 15 - 48 U/L    Alkaline Phosphatase 95 38 - 126 U/L    Anion Gap 7 (L) 9 - 15 mmol/L   CBC w/ Differential   Result Value Ref Range    WBC 3.4 (L) 4.5 - 11.0 10*9/L    RBC 3.94 (L) 4.00 - 5.20 10*12/L    HGB 11.9 (L) 12.0 - 16.0 g/dL    HCT 40.9 (L) 81.1 - 46.0 %    MCV 87.4 80.0 - 100.0 fL    MCH 30.1 26.0 - 34.0 pg    MCHC 34.5 31.0 - 37.0 g/dL    RDW 91.4 (H) 78.2 - 15.0 %    MPV 8.5 7.0 - 10.0 fL    Platelet 69 (L) 150 - 440 10*9/L    Variable HGB Concentration Slight (A) Not Present    Neutrophils % 57.0 %    Lymphocytes % 34.8 %    Monocytes % 5.7 %    Eosinophils % 0.1 %    Basophils % 0.4 %    Absolute Neutrophils 2.0 2.0 - 7.5 10*9/L    Absolute Lymphocytes 1.2 (L) 1.5 - 5.0 10*9/L    Absolute Monocytes 0.2 0.2 - 0.8 10*9/L    Absolute Eosinophils 0.0 0.0 - 0.4 10*9/L    Absolute Basophils 0.0 0.0 - 0.1 10*9/L    Large Unstained Cells 2 0 - 4 %    Microcytosis Slight (A) Not Present    Anisocytosis Slight (A) Not Present

## 2017-12-18 NOTE — Unmapped (Signed)
1530 Pt arrived to unit for infusion.  Herbie Baltimore RN reviewed orders and initiated care. 1623 chemo infusion started; RN to monitor. 1830 Pt transferred to [facility] at shift change; pt aware of change in staff and voices no concerns.

## 2017-12-19 NOTE — Unmapped (Signed)
If you feel like this is an emergency please call 911.  For appointments or questions Monday through Friday 8AM-5PM please call [phone] or Toll Free [phone]. For Medical questions or concerns ask for the Nurse Triage Line.  On Nights, Weekends, and Holidays call [phone] and ask for the Oncologist on Call.  Reasons to call the Nurse Triage Line:  Fever of 100.5 or greater  Nausea and/or vomiting not relived with nausea medicine  Diarrhea or constipation  Severe pain not relieved with usual pain regimen  Shortness of breath  Uncontrolled bleeding  Mental status changes

## 2017-12-19 NOTE — Unmapped (Signed)
1830-Report received from Yevette Edwards, primary RN patient to finish obinutuzumab infusion. Patient stable and has no needs at this time.     1940-Received treatment without complications via port. At completion of treatment port flushed, heparinized and de- accessed. Patient stable at discharge. Accompanied by family. AVS given.

## 2018-01-01 ENCOUNTER — Ambulatory Visit: Admit: 2018-01-01 | Discharge: 2018-01-02 | Payer: MEDICARE | Attending: Otolaryngology | Primary: Otolaryngology

## 2018-01-01 ENCOUNTER — Ambulatory Visit
Admit: 2018-01-01 | Discharge: 2018-01-30 | Payer: MEDICARE | Attending: Speech-Language Pathologist | Primary: Speech-Language Pathologist

## 2018-01-01 DIAGNOSIS — R490 Dysphonia: Principal | ICD-10-CM

## 2018-01-01 DIAGNOSIS — C9110 Chronic lymphocytic leukemia of B-cell type not having achieved remission: Secondary | ICD-10-CM

## 2018-01-01 DIAGNOSIS — J310 Chronic rhinitis: Secondary | ICD-10-CM

## 2018-01-01 MED ORDER — RANITIDINE 150 MG CAPSULE
ORAL_CAPSULE | Freq: Two times a day (BID) | ORAL | 3 refills | 0.00000 days | Status: CP
Start: 2018-01-01 — End: 2018-04-30

## 2018-01-01 NOTE — Unmapped (Addendum)
Main clinic: [phone]  Office fax #: [phone]    For appointments call [phone] option 2    Nursing questions contact Dr. Atha Starks nurse, Thermon Leyland at [phone].  For surgical scheduling, call Maryann Alar [phone]    Emergency after hours, please call [phone] and ask to speak with the ENT physician on call.                 REFLUX INFORMATION SHEET      What is GERD?   Gastroesophageal reflux (GERD) occurs when acid from the stomach backs up into the esophagus (food pipe).  The lower esophageal sphincter (LES) is a ring of muscle at the bottom of esophagus that contracts to prevent stomach contents from refluxing back into the esophagus.  In patients with GERD, the LES may not be functioning properly and the stomach acid backs up into the esophagus and can damage the lining.    What is LPR?   Laryngopharyngeal reflux or reflux laryngitis occurs when the contents of the stomach reflux beyond the esophagus and into the back of the throat (larynx and pharynx) and possibly the back of the nasal airway.     **GERD and LPR can occur together; patients can also have GERD without LPR or LPR without GERD. 50% of patients with LPR have no heartburn.     Comparing GERD and LPR:      Gastroesophageal reflux/GERD Laryngopharyngeal reflux/LPR   Symptoms  Heartburn, regurgitation, nausea, trouble swallowing, chest pain, dry cough, bad breath Hoarseness, vocal fatigue, throat clearing, cough, swallowing difficulties, nasal drainage, feel a ???lump in the throat???, ???phlegm???, bitter taste, and sometimes trouble breathing   Findings that may be found on diagnostic testing Esophagitis, lower esophageal dysfunction Laryngeal inflammation, upper esophageal dysfunction   Complications that can occur Change in esophageal lining called Barrett???s esophagus that has a small chance of turning into cancer Vocal disorders including vocal nodules and contact granulomas, sinus and ear infections   Pattern of reflux Occurs when lying flat and often after meals Can occur when upright/day   Treatment Often treated by gastroenterologist; treatment can include: PPI (proton pump inhibitor), H2 blocker, diet and lifestyle changes Often treated by an ENT,  treatment can include: PPI (proton pump inhibitor), H2 blocker, diet and lifestyle changes            Division of Voice & Swallowing Disorders  Department of Otolaryngology-Head & Neck Surgery  [facility]  [email or web address]  ~  Clinic Phone:  [phone]  Clinic Fax:  [phone]    Coping with Reflux = Diet and Lifestyle Changes    1. Avoid foods that commonly aggravate symptoms:  spicy, tomato-based, fatty, chocolate, citrus fruits, fruit juices, coffee, tea, alcohol, soft drinks with carbonation.           2. Watch your weight???being overweight increased abdominal pressure and increases reflux.       3. Eat small meals, do NOT over eat.      4. Wait 2-4 hours after eating before exercise OR before lying down.       5. Elevate head of bed with blocks.      6. Stop smoking.   7. Take reflux medication, if directed to do so by your physician.   ? PPI/proton pump inhibitors (reduces acid production by the stomach); should be taken 30-60 min before meals.  Do not take within 2 hours of Synthroid/levothyroxine.    - Examples:  Prilosec/omeprazole, Prevacid/lansoprazole, Nexium/Esomeprazole, Dexilant/dexlansoprazole.  -  Risks/side effects include but not limited to: headache, nausea, abdominal pain, possible changes in mineral/vitamin absorption, or very rare cases of acute kidney problems.  ? H2 blockers (decreases stomach secretion of acid), often taken at bedtime.   - Examples: Zantac/ranitidine, Pepcid/famotidine, Tagamet/cimetidine.  - Risks/side effects include but not limited to: dizziness, nausea, rarely confusion, abnormal heart rhythm

## 2018-01-01 NOTE — Unmapped (Signed)
Otolaryngology New Voice Consult Visit    Reason for visit:  [name] is seen in consultation at the request of Coombs, Lurline Idol* for the evaluation of dysphonia.    History of Present Illness:  Ms.Katrina Gould is a 77 y.o.  female patient with a past medical history positive for leukemia, non-Hodgkin lymphoma, and a 9 month history of dysphonia.     The patient reports a 53-month onset of declining voice quality which begun immediately following being diagnosed with pneumonia in December 2018.  At the time she reports experiencing low-grade fever, thick phlegm production, and productive cough. Since that time she has noticed a persistent decline in her voice quality which occurs mainly upon rising in the a.m.. When she wakes she states her voice is croaky.  As the day progressed that she states her voice quality improved significantly until returning to baseline midmorning.    [name] has a history of leukemia, and non-Hodgkin's lymphoma for which she is currently being treated with chemo therapy medications both daily, and monthly infusions.  She endorses postnasal drainage, and worsening sinus headaches, (right supraorbital) which occur almost daily. In the past she utilized Astelin nasal spray, Flonase and antihistamines.  She reports a long-standing history of sinus problems, however not ever to this extent.    [name] reports a history of hiatal hernia, and is currently taking pantoprazole 40 mg tablets once daily.  The patient denies any instances of dysphagia or shortness of breath.    Symptoms began gradually      Voice complaints include: rough  Swallowing symptoms include None  Reflux symptoms include None  Airway symptoms include: None    Past Medical History:  Past Medical History:   Diagnosis Date   ??? Leukemia (CMS-HCC)    ??? Non-Hodgkin lymphoma (CMS-HCC)        Past Surgical History:  Past Surgical History:   Procedure Laterality Date   ??? IR INSERT PORT AGE GREATER THAN 5 YRS  10/31/2017 IR INSERT PORT AGE GREATER THAN 5 YRS 10/31/2017 Katrina Levy, MD IMG VIR H&V [facility]       Medications:    Current Outpatient Medications:   ???  allopurinol (ZYLOPRIM) 300 MG tablet, Take 1 tablet (300 mg total) by mouth daily., Disp: 30 tablet, Rfl: 2  ???  fesoterodine (TOVIAZ) 4 mg 24 hr tablet, Take by mouth., Disp: , Rfl:   ???  fluticasone propion-salmeterol (ADVAIR) 100-50 mcg/dose diskus, Inhale., Disp: , Rfl:   ???  lidocaine (LIDODERM) 5 % patch, Place 1 patch on the skin daily. Apply to affected area for 12 hours only each day (then remove patch), Disp: 5 patch, Rfl: 0  ???  LORazepam (ATIVAN) 0.5 MG tablet, Take 0.5 mg by mouth two (2) times a day as needed. , Disp: , Rfl:   ???  mirtazapine (REMERON) 7.5 MG tablet, Take 7.5 mg by mouth nightly., Disp: , Rfl:   ???  multivitamin (TAB-A-VITE/THERAGRAN) per tablet, Take 1 tablet by mouth daily. , Disp: , Rfl:   ???  oxyCODONE (ROXICODONE) 5 MG immediate release tablet, Take 1 tablet (5 mg total) by mouth every eight (8) hours as needed for pain., Disp: 20 tablet, Rfl: 0  ???  pantoprazole (PROTONIX) 40 MG tablet, Take by mouth daily at 0600. , Disp: , Rfl:   ???  pravastatin (PRAVACHOL) 80 MG tablet, TAKE 1 TABLET BY MOUTH EVERY NIGHT AT BEDTIME, Disp: , Rfl:   ???  predniSONE (DELTASONE) 10 MG  tablet, 6 TABS X 1 DAY, 5 TABS X 1 DAY, 4 TABS X 1 DAY, ETC..., Disp: , Rfl: 0  ???  sertraline (ZOLOFT) 50 MG tablet, Take 50 mg by mouth daily., Disp: , Rfl: 1  ???  topiramate (TOPAMAX) 50 MG tablet, Take 50 mg by mouth daily., Disp: , Rfl: 3  ???  venetoclax (VENCLEXTA STARTING PACK) 10 mg-50 mg- 100 mg tablet, TAKE BY MOUTH AS DIRECTED ON PACKAGE LABELING, Disp: 42 each, Rfl: 0  ???  venetoclax (VENCLEXTA) 100 mg tablet, TAKE 4 TABLETS BY MOUTH ONCE DAILY WITH A MEAL AND WATER - DO NOT CHEW CRUSH OR BREAK TABLETS, Disp: 120 each, Rfl: 6     Allergies:  Allergies   Allergen Reactions   ??? Sertraline Other (See Comments)     Nervousness, panic, shortness of breath   ??? Topiramate Palpitations Panic attacks; unable to sleep   ??? Baclofen      made me go crazy       Family History:  No family history on file.    Social History:  Social History     Tobacco Use   ??? Smoking status: Never Smoker   ??? Smokeless tobacco: Never Used   Substance Use Topics   ??? Alcohol use: No   ??? Drug use: No       Review of Systems:      Ears, nose, mouth, throat, and face: positive for voice change  The review of the patient's10 system review of systems had no pertinent positives    Physical Exam:   Constitutional:  Vitals reviewed on nursing chart, patient has normal appearance. Well nourished, well-developed, no acute distress  Voice: rough   Respiration:  Breathing comfortably, no stridor.  CV: No clubbing/cyanosis/edema in hands.   Eyes:  extraocular motion intact, sclera normal.   Neuro:  Alert and oriented times 3, Cranial nerves 2-12 intact and symmetric bilaterally.   Head and Face:  Skin with no masses or lesions, sinuses nontender to palpation, facial nerve fully intact.   Salivary Glands:  Parotid and submandibular glands normal bilaterally.   Ears:  Normal tympanic membranes to otoscopy.  Nose:  External nose midline, anterior rhinoscopy is normal with limited visualization just to the anterior interior turbinate.   Oral Cavity/Oropharynx/Lips:  Normal mucous membranes, normal floor of mouth/tongue/oropharynx, no masses or lesions are noted.    Pharyngeal Walls:  No masses noted.  Neck/Lymph:  No lymphadenopathy, no thyroid masses.        Procedure Note    Endoscopy Type:  Flexible Fiberoptic Videostroboscopy    Flexible endoscope serial number [id number] used today by Katrina Marseilles, MD    Indications/TimeOut:  To better evaluate the patient???s symptoms, fiberoptic videostroboscopy is indicated.   A time out identifying the patient, the procedure, the location of the procedure and any concerns was performed prior to beginning the procedure.    Procedure Details:    The patient was placed in the sitting position. After topical anesthesia and decongestion with oxymetazoline and lidocaine, the flexible laryngoscope was passed.  The microphone was used to trigger the xenon stroboscopic light source.  Nasal endoscopy: No evidence of purulence or polyposis no other significant sinonasal abnormality    -  Hypopharynx - There were no lesions in the pyriformis, epiglottis, or base of tongue.  -  Larynx -  there was mild interarytenoid edema, no erythema.  -  Vocal Folds:     Supraglottis: Normal     Infraglottis/Subglottis:  Normal     Mobility: Normal bilaterally     Amplitude: Symmetric bilaterally     Mucosal Wave: globally diminished     Closure: Complete     Lesions/ Findings: Supraglottic hyperfunction    Condition:  Stable.  Patient tolerated procedure well.    Complications: None        VRQOL: 90  GFI:0      Voice Evaluation:   A Speech Language Pathology evaluation was ordered and deemed necessary and standard of care.      Assessment:   Based upon head and neck examination and SLP evaluation the patient has several salient findings including:    1.  Sinus congestion and rhinorrhea  2.  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  The patient continues to complain of persistent thick rhinorrhea with right frontal already/supraorbital headache.  We discussed the role for formal sinus/allergy work-up by 1 of our primary rhinologist    Plan:  1.  Neilmed nasal rinses    2 supplement LPR management (ranitidine nightly) lifestyle and dietary LPR interventions    3.  We discussed that if the patient continues to have significant sinonasal symptoms, that I would recommend evaluation (CT) and clinical work-up by one of our rhinology faculty    4.  Follow-up in 3 to 4 months for interval evaluation, or sooner if frontal headaches and sinonasal complaints persist.      The patient in agreement the plan as articulated above.          This note was created with [employer] and may have errors that were not dictated and not seen in editing.     Katrina Gould  Katrina Gould

## 2018-01-02 NOTE — Unmapped (Signed)
Flexible endoscope serial number [id number] used today by Lawrence Marseilles, MD

## 2018-01-02 NOTE — Unmapped (Signed)
[facility] SPEECH [place] [address]  OUTPATIENT SPEECH PATHOLOGY  01/01/2018      Patient Name: Katrina Gould  Date of Birth:1940-09-15  Session Number: 1  Diagnosis:   Encounter Diagnosis   Name Primary?   ??? Dysphonia Yes        Date of Evaluation: 01/01/18  Date of Symptom Onset: 06/29/17  Referred by: Lawrence Marseilles, MD  Reason for Referral: Evaluation Speech, Language, Voice, Cognition        Chief Complaint: Dysphonia      ASSESSMENT:  Moderate dysphonia    Characterized by: low pitch, roughness, hoarseness, breathiness, phonation breaks, pharyngeal focus, glottal fry  Therapy Techniques utilized during trial/diagnostic therapy:: N/A ??? Voice therapy not warranted  Comments: Patient not interested in pursuing therapy at this time      Prognosis:  Good    Negative Prognosis Rationale: Time post onset       Positive Prognosis Rationale: Response to trial treatment      Goals:  Patient and Family Goals: To improve voice function         SUBJECTIVE:    [name] is a 77 y.o. ??female patient with a past medical history positive for leukemia, non-Hodgkin lymphoma, and a 9 month history of dysphonia. ??Voice changes began after being diagnosed with pneumonia in December 2018. ??Her voice did return to baseline. ??However, she does state that her voice is often now croaky in the mornings. ??Patient is concerned for cancer as she carries a diagnosis of non-Hodgkin's lymphoma and leukemia for which she is currently being treated with chemo therapy medications both daily, and monthly infusions.    She endorses postnasal drainage, and worsening sinus headaches, (right supraorbital) which occur almost daily. In the past she utilized Astelin nasal spray, Flonase and antihistamines. ??She reports a long-standing history of sinus problems, however not ever to this extent.  ??  [name] reports a history of hiatal hernia, and is currently taking pantoprazole 40 mg tablets once daily. ??The patient denies any instances of dysphagia or shortness of breath.    Communication Preference: Verbal, Written, Visual         Barriers to Learning: No Barriers   Hearing Exceptions: No hearing aid                                                 Prior treatment for referral reason: No                  Pain?: Yes   Comments: 5/10 (Headache)  Precautions: None         Prior Function: Independent, Retired(Florist)                                        Past Medical History:   Diagnosis Date   ??? Leukemia (CMS-HCC)    ??? Non-Hodgkin lymphoma (CMS-HCC)     No family history on file.  Past Surgical History:   Procedure Laterality Date   ??? IR INSERT PORT AGE GREATER THAN 5 YRS  10/31/2017    IR INSERT PORT AGE GREATER THAN 5 YRS 10/31/2017 Reece Levy, MD IMG VIR H&V [facility]      Allergies   Allergen Reactions   ??? Sertraline  Other (See Comments)     Nervousness, panic, shortness of breath   ??? Topiramate Palpitations     Panic attacks; unable to sleep   ??? Baclofen      made me go crazy     Social History     Tobacco Use   ??? Smoking status: Never Smoker   ??? Smokeless tobacco: Never Used   Substance Use Topics   ??? Alcohol use: No      Current Outpatient Medications   Medication Sig Dispense Refill   ??? allopurinol (ZYLOPRIM) 300 MG tablet Take 1 tablet (300 mg total) by mouth daily. 30 tablet 2   ??? fesoterodine (TOVIAZ) 4 mg 24 hr tablet Take by mouth.     ??? fluticasone propion-salmeterol (ADVAIR) 100-50 mcg/dose diskus Inhale.     ??? lidocaine (LIDODERM) 5 % patch Place 1 patch on the skin daily. Apply to affected area for 12 hours only each day (then remove patch) 5 patch 0   ??? LORazepam (ATIVAN) 0.5 MG tablet Take 0.5 mg by mouth two (2) times a day as needed.      ??? mirtazapine (REMERON) 7.5 MG tablet Take 7.5 mg by mouth nightly.     ??? multivitamin (TAB-A-VITE/THERAGRAN) per tablet Take 1 tablet by mouth daily.      ??? oxyCODONE (ROXICODONE) 5 MG immediate release tablet Take 1 tablet (5 mg total) by mouth every eight (8) hours as needed for pain. 20 tablet 0 ??? pantoprazole (PROTONIX) 40 MG tablet Take by mouth daily at 0600.      ??? pravastatin (PRAVACHOL) 80 MG tablet TAKE 1 TABLET BY MOUTH EVERY NIGHT AT BEDTIME     ??? predniSONE (DELTASONE) 10 MG tablet 6 TABS X 1 DAY, 5 TABS X 1 DAY, 4 TABS X 1 DAY, ETC...  0   ??? ranitidine (ZANTAC) 150 MG capsule Take 1 capsule (150 mg total) by mouth Two (2) times a day. 180 capsule 3   ??? sertraline (ZOLOFT) 50 MG tablet Take 50 mg by mouth daily.  1   ??? topiramate (TOPAMAX) 50 MG tablet Take 50 mg by mouth daily.  3   ??? venetoclax (VENCLEXTA STARTING PACK) 10 mg-50 mg- 100 mg tablet TAKE BY MOUTH AS DIRECTED ON PACKAGE LABELING 42 each 0   ??? venetoclax (VENCLEXTA) 100 mg tablet TAKE 4 TABLETS BY MOUTH ONCE DAILY WITH A MEAL AND WATER - DO NOT CHEW CRUSH OR BREAK TABLETS 120 each 6     No current facility-administered medications for this visit.          OBJECTIVE  Voice - General   Hydration: 6 bottles of water daily  Caffeine Use: None  Onset: Sudden  Daily Voice Use: Minimal demand   Acoustic Measures  Connected Sample: CAPE-V Sentences  Connected Sample 1 (Hz): 183.73 Hz   Total Fundamental Frequency  Total Fundamental Frequency Range (Hz): 165 Hz  to (Hz): 750 Hz   Sustained Phonation  Mean Fund Freq (Hz): 205.5 Hz  Relative Average Perturbation (RAP) %: 0.84 %  Noise-to-Harmonic Ratio (NHR): 0.12  Degree of Subharmonics (DSH) %: 0 %  Degree of Voiceless (DUV) %: 0 %  Soft Phonation Index (SPI) : 7.22  Pt has decrease in following acoustic measures:: Mean Fo   Aerodynamic Measures  Maximum phonation time on /a/ in sec:: 6 sec  Mean Flow Rate (mL/sec): 430 mL/sec  at (Hz): 213.38 Hz  and (dB): 83.12 dB  Objective and/or perceptual measures suggest:: Weak voice, Hyperfunctional  pattern  Pulmonary Breath Support: SOB  Primary Breath Support: Diaphragmatic, Thoracic  Speaks on functional residual capacity?: No       Perceptual Measures  Grade:: 1  Breathy:: 0  Strained:: 1  Rough:: 2  Asthenic:: 1  Total:: 5 /15   Tremor / Rate of Speech  Tremor:: Absent  Rate of Speech:: WFL   Subjective Measures  Glottal Function Index (GFI):: 0  Voice-related Quality of Life (V-RQOL):: 90    Session Duration : 20    Today's Charges (noted here with $$):     SLP Evaluations  $$ Laryngeal Function Study - Voice Ctr only [mins]: 15       I attest that I have reviewed the above information.  Signed: Jarold Song, SLP  01/01/2018 12:43 PM

## 2018-01-08 NOTE — Unmapped (Signed)
[facility] Triage Note     Patient: Katrina Gould     Reason for call:  requesting Venclexta refill    Time call returned: 1334     Phone Assessment: returned call to pt. Pt requesting Venclexta refill.      Triage Recommendations: [facility]. Confirmed that refill was available and will be delivered to pt tomorrow. Pt made aware     Patient Response: appreciative of call      Outstanding tasks: None     Patient Pharmacy has been verified and primary pharmacy has been marked as preferred

## 2018-01-08 NOTE — Unmapped (Signed)
Hi,    Patient Katrina Gould called requesting a medication refill for the following:    ? Medication: Venclexta  ? Dosage: 100 mg  ? Days left of medication: 2  ? Pharmacy: [facility]     The expected turnaround time is 3-4 business days     Thank you,  Vernie Ammons  [facility]  [phone]

## 2018-01-08 NOTE — Unmapped (Signed)
[facility] Refill Coordination Note  Specialty Medication(s): Venclexta  Additional Medications shipped: none    Katrina Gould, DOB: 1940/10/02  Phone: [phone] (home) , Alternate phone contact: N/A  Phone or address changes today?: No  All above HIPAA information was verified with patient.  Shipping Address: [address]  [place] [address]   Insurance changes? No    Completed refill call assessment today to schedule patient's medication shipment from the [facility] [phone]).      Confirmed the medication and dosage are correct and have not changed: Yes, regimen is correct and unchanged.    Confirmed patient started or stopped the following medications in the past month:  No, there are no changes reported at this time.    Are you tolerating your medication?:  Katrina Gould reports tolerating the medication.    ADHERENCE        Did you miss any doses in the past 4 weeks? No missed doses reported.    FINANCIAL/SHIPPING    Delivery Scheduled: Yes, Expected medication delivery date: 01/10/18 wfd      The patient will receive a drug information handout for each medication shipped and additional FDA Medication Guides as required.      Katrina Gould did not have any additional questions at this time.    Delivery address validated in Epic.    We will follow up with patient monthly for standard refill processing and delivery.      Thank you,  Rollen Sox   [facility] Specialty Pharmacist

## 2018-01-09 MED FILL — VENCLEXTA 100 MG TABLET: 30 days supply | Qty: 120 | Fill #1 | Status: AC

## 2018-01-09 MED FILL — VENCLEXTA 100 MG TABLET: 30 days supply | Qty: 120 | Fill #1

## 2018-01-15 ENCOUNTER — Ambulatory Visit: Admit: 2018-01-15 | Discharge: 2018-01-15 | Payer: MEDICARE

## 2018-01-15 ENCOUNTER — Ambulatory Visit: Admit: 2018-01-15 | Discharge: 2018-01-15 | Payer: MEDICARE | Attending: Adult Health | Primary: Adult Health

## 2018-01-15 ENCOUNTER — Other Ambulatory Visit: Admit: 2018-01-15 | Discharge: 2018-01-15 | Payer: MEDICARE

## 2018-01-15 DIAGNOSIS — C9110 Chronic lymphocytic leukemia of B-cell type not having achieved remission: Principal | ICD-10-CM

## 2018-01-15 DIAGNOSIS — T451X5A Adverse effect of antineoplastic and immunosuppressive drugs, initial encounter: Secondary | ICD-10-CM

## 2018-01-15 DIAGNOSIS — R1033 Periumbilical pain: Secondary | ICD-10-CM

## 2018-01-15 DIAGNOSIS — D701 Agranulocytosis secondary to cancer chemotherapy: Secondary | ICD-10-CM

## 2018-01-15 DIAGNOSIS — Z0000 Encounter for general adult medical examination without abnormal findings: Secondary | ICD-10-CM

## 2018-01-15 DIAGNOSIS — D591 Other autoimmune hemolytic anemias: Secondary | ICD-10-CM

## 2018-01-15 DIAGNOSIS — R197 Diarrhea, unspecified: Secondary | ICD-10-CM

## 2018-01-15 DIAGNOSIS — M533 Sacrococcygeal disorders, not elsewhere classified: Secondary | ICD-10-CM

## 2018-01-15 LAB — CBC W/ AUTO DIFF
BASOPHILS RELATIVE PERCENT: 0.4 %
EOSINOPHILS RELATIVE PERCENT: 0.3 %
HEMATOCRIT: 34.4 % — ABNORMAL LOW (ref 36.0–46.0)
HEMOGLOBIN: 11.8 g/dL — ABNORMAL LOW (ref 12.0–16.0)
LARGE UNSTAINED CELLS: 2 % (ref 0–4)
LYMPHOCYTES ABSOLUTE COUNT: 0.9 10*9/L — ABNORMAL LOW (ref 1.5–5.0)
LYMPHOCYTES RELATIVE PERCENT: 47.5 %
MEAN CORPUSCULAR HEMOGLOBIN CONC: 34.2 g/dL (ref 31.0–37.0)
MEAN CORPUSCULAR HEMOGLOBIN: 31.4 pg (ref 26.0–34.0)
MONOCYTES ABSOLUTE COUNT: 0.2 10*9/L (ref 0.2–0.8)
MONOCYTES RELATIVE PERCENT: 9.3 %
NEUTROPHILS ABSOLUTE COUNT: 0.8 10*9/L — ABNORMAL LOW (ref 2.0–7.5)
NEUTROPHILS RELATIVE PERCENT: 40.5 %
PLATELET COUNT: 28 10*9/L — ABNORMAL LOW (ref 150–440)
RED BLOOD CELL COUNT: 3.74 10*12/L — ABNORMAL LOW (ref 4.00–5.20)
RED CELL DISTRIBUTION WIDTH: 19.1 % — ABNORMAL HIGH (ref 12.0–15.0)
WBC ADJUSTED: 1.9 10*9/L — ABNORMAL LOW (ref 4.5–11.0)

## 2018-01-15 LAB — COMPREHENSIVE METABOLIC PANEL
ALBUMIN: 3.5 g/dL (ref 3.5–5.0)
ALKALINE PHOSPHATASE: 73 U/L (ref 38–126)
ANION GAP: 7 mmol/L — ABNORMAL LOW (ref 9–15)
AST (SGOT): 23 U/L (ref 14–38)
BILIRUBIN TOTAL: 0.7 mg/dL (ref 0.0–1.2)
BLOOD UREA NITROGEN: 11 mg/dL (ref 7–21)
BUN / CREAT RATIO: 13
CHLORIDE: 109 mmol/L — ABNORMAL HIGH (ref 98–107)
CO2: 24 mmol/L (ref 22.0–30.0)
CREATININE: 0.85 mg/dL (ref 0.60–1.00)
EGFR CKD-EPI AA FEMALE: 75 mL/min/{1.73_m2} (ref >=60)
EGFR CKD-EPI NON-AA FEMALE: 65 mL/min/{1.73_m2} (ref >=60)
GLUCOSE RANDOM: 90 mg/dL (ref 65–179)
POTASSIUM: 4 mmol/L (ref 3.5–5.0)
PROTEIN TOTAL: 5.9 g/dL — ABNORMAL LOW (ref 6.5–8.3)
SODIUM: 140 mmol/L (ref 135–145)

## 2018-01-15 LAB — CHLORIDE: Chloride:SCnc:Pt:Ser/Plas:Qn:: 109 — ABNORMAL HIGH

## 2018-01-15 LAB — MEAN CORPUSCULAR HEMOGLOBIN: Lab: 31.4

## 2018-01-15 LAB — PHOSPHORUS: Phosphate:MCnc:Pt:Ser/Plas:Qn:: 3.5

## 2018-01-15 LAB — URIC ACID
URIC ACID: 3.9 mg/dL (ref 3.0–6.5)
Urate:MCnc:Pt:Ser/Plas:Qn:: 3.9

## 2018-01-15 NOTE — Unmapped (Signed)
[facility] Follow Up       Patient Name: Katrina Gould  Patient Age: 77 y.o.  Encounter Date: 01/15/2018      Chief complaint/Reason for visit: CLL/SLL    Assessment:  Katrina Gould is a 77 y.o. female who presents for follow of her CLL/SLL, Rai stage 4. Disease markers: 46,XX,der(4)t(4;4)(p14;q21). IGHV unmutated (0%, 3-33*01). B48m 5.05. No TP53 mutation. High-risk by CLL-IPI (6 points).    Katrina Gould is here for C4D1 obinituzumab.  She has tolerated ramp up to 400mg  of venetoclax well, without evidence of TLS.  No new lymphadenopathy. ANC (0.8) and platelets (28) decreased today, likely related to chemotherapy. We will hold her chemo this week due to neutropenia and thrombocytopenia and will recheck labs weekly.  Granix 480 mcg today.    Biggest complaint today is her lower back/coccyx pain, which has not improved since her fall.   We discussed imaging the area with the patient, but there is likely not much that can be done, and this would likely not change our management.    Continues to complain of upper, mid to right sided abdominal pain, unrelated to bowel patterns, gas or food.  LFTs stable, and most recent CT negative for cholelithiasis.  Will repeat soon.    Diarrhea, nausea, and taste changes likely related to chemo. Patient was offered PRN anti-emetic but declined.    Advised patient to avoid dental work (crown replacement and tooth extraction) until ANC >1.0 and plts >50 k.     Plans and Recommendations:  1. CLL, Rai 4   - HOLD obinituzumab and Venetoclax this week  - RTC in one week for lab recheck. If ANC and plts continue to trend down, will likely dose reduce chemo.  - granix 480 mcg subcutaneously today and possibly next week    3. Rib/abdominal pain  - likely related to fall and rib fracture, given normal LFTs  - repeat CT abdomen today to rule out other causes    4. Chemotherapy induced n/v/d  - continue Imodium as needed for loose stools  - offered PRN anti-emetic, but patient declined and does not want to take any additional medications. She knows this is available if symptoms worsen.    5. Hoarseness  - pt following up with ENT.   - resolved    6. Dentition  - avoid dental work and tooth extraction until ANC >1.0 and plts >50k.   - continue with good oral hygiene    6. Health Maintenance  - would like to receive flu vaccine when available - will administer next week if able  - continue annual dermatology follow up  - still needs 2nd shingrix vaccine      Due to this patient's diagnosis, she is at significant risk for subsequent morbidity and/or mortality.      Markus Jarvis, AGPCNP-C, MSN, OCN  Nurse Practitioner  Hematologic Malignancies  [facility]  [phone] (phone)  [phone] (fax)  [email or web address]        Interval History:  Doing fair today.  Continues to have pain from when she fell a few weeks ago.  Pain persists to right rib cage and coccyx/lower back area. 6-7/10 constantly. She has also had ongoing intermittent loose stools, well controled with Imodium, which she has used only once with good effect. Appetite continues to be poor, but patient continues to try to eat every meal. She reports that taste changes and nausea have prevented her from eating as much as she  used to. Patient also endorses ongoing upper,mid to right abdominal pain unrelated to other GI symptoms.  This has not changed since her last visit.    She has an appointment scheduled this week with her dentist for a crown replacement and tooth extraction and is wondering if it is safe to do so or if she should hold off.     Denies headache, chest pain, SOB, cough, or rash.    Otherwise, she denies new constitutional symptoms such as anorexia, weight loss, drenching night sweats or unexplained fevers.  Furthermore, she denies symptoms of marrow failure:  recurrent or unexplained intercurrent infections.  There have been no new or unexplained pains or self-identified masses, swelling or enlarged lymph nodes.    Past Medical, Surgical and Family History were reviewed and pertinent updates were made in the Electronic Medical Record  ??  Review of Systems:  Other than as reported above in the interim history, the other systems reviewed were unremarkable.  ??  ECOG Performance Status: 1     Oncology History:    Oncology History    CLL    First seen in oncology clinic locally 03/31/16    Had a left axillary biopsy 03/15/16 that confirmed CLL/SLL: Flow showed monoclonal B-cell population with CD5, CD23, kappa light chain. IHC showed positive CD20 (diffuse), negative for cyclin D1.    No constitutional symptoms except some sweats    Labs: Hb 12.0, Hct 34.8, plt 147. WBC was 10.6 with ANC 2.5. ALC was 7.5K.    CLL FISH studies: normal panel. No cells with 11q, tri 12, 17p or 13q, and the 11;14 translocation also not detected.    CT c/a/p 04/04/16 showed mild LAD in chest, abd, and pelvis and spleen was at ULN (13 x 5.6 x 10.9 cm). Largest node 1.6 cm.    Of note is that she had a prior CT on 04/06/15 that showed similarly small enlarged node, largest being 1.9 cm.    CLL-IPI: As per Lancet Oncol Vol 15 October 2014  Includes (1) TP53 status (no abnormalities vs. Del 17p and/or TP53 mutation - 4 points) - no 17p (did not send TP53 mutation testing), (2) IGHV mutation status (mut vs unmut - 2 points) (pending) (3) serum B2-microglobulin (</=3.5 vs >3.5 - 2 points) (gets 2 points here) (4) clinical stage (Rai 0 vs Rai I-IV - 1 point) (gets 1 point here) and (5) age (</=65 vs >65 - 1 point)  (gets 1 point here)     Low risk (0-1 points)= 93.2% OS at 5 years  Int risk (2-3 points)= 79.3%  High risk (4-6 points)= 63.3%  - this patient has 4 points already, pending IGHV testing and prior to treatment would also need to send TP53 mutation testing  Very high risk (7-10 points)= 23.3%               CLL (chronic lymphocytic leukemia) (CMS-HCC)    05/16/2016 Initial Diagnosis     CLL (chronic lymphocytic leukemia) (RAF-HCC) Chronic lymphocytic leukemia, Rai stage IV (CMS-HCC)    09/05/2017 - 10/11/2017 Chemotherapy     Chemotherapy Treatment    Treatment Goal Control   Line of Treatment [No plan line of treatment]   Plan Name OP RITUXIMAB    Start Date 09/14/2017   End Date 10/05/2017   Provider Pernell Dupre, MD   Chemotherapy riTUXimab (RITUXAN) 712.5 mg in sodium chloride (NS) 0.9 % 500 mL IVPB, 375 mg/m2 = 712.5 mg, Intravenous, Once, 1 of  1 cycle  Administration: 712.5 mg (09/14/2017)  riTUXimab (RITUXAN) 712.5 mg in sodium chloride (NS) 0.9 % 250 mL rapid infusion, 375 mg/m2 = 712.5 mg, Intravenous, Once, 1 of 1 cycle  Administration: 712.5 mg (09/21/2017), 712.5 mg (09/28/2017), 712.5 mg (10/05/2017)         10/16/2017 Initial Diagnosis     Chronic lymphocytic leukemia, Rai stage IV (CMS-HCC)      10/23/2017 -  Chemotherapy     Chemotherapy Treatment    Treatment Goal Control   Line of Treatment [No plan line of treatment]   Plan Name OP OBINUTUZUMAB AND VENETOCLAX   Start Date 10/23/2017   End Date 03/12/2018 (Planned)   Provider Pernell Dupre, MD   Chemotherapy dexamethasone (DECADRON) tablet 20 mg, 20 mg, Oral, Once, 3 of 6 cycles  Administration: 20 mg (10/23/2017), 20 mg (10/24/2017), 20 mg (10/30/2017), 20 mg (11/06/2017), 20 mg (11/20/2017), 20 mg (12/18/2017)  obinutuzumab 100 mg in sodium chloride (NS) 0.9 % 50 mL IVPB, 100 mg, Intravenous, Once, 3 of 6 cycles  Administration: 100 mg (10/23/2017), 900 mg (10/24/2017), 1,000 mg (10/30/2017), 1,000 mg (11/06/2017), 1,000 mg (11/20/2017), 1,000 mg (12/18/2017)          Social history:  Here with son  Lives alone  Husband died in November 08, 2008    Allergies   Allergen Reactions   ??? Sertraline Other (See Comments)     Nervousness, panic, shortness of breath   ??? Topiramate Palpitations     Panic attacks; unable to sleep   ??? Baclofen      made me go crazy         Current Outpatient Medications   Medication Sig Dispense Refill   ??? allopurinol (ZYLOPRIM) 300 MG tablet Take 1 tablet (300 mg total) by mouth daily. 30 tablet 2   ??? fesoterodine (TOVIAZ) 4 mg 24 hr tablet Take by mouth.     ??? fluticasone propion-salmeterol (ADVAIR) 100-50 mcg/dose diskus Inhale.     ??? LORazepam (ATIVAN) 0.5 MG tablet Take 0.5 mg by mouth two (2) times a day as needed.      ??? mirtazapine (REMERON) 7.5 MG tablet Take 7.5 mg by mouth nightly.     ??? multivitamin (TAB-A-VITE/THERAGRAN) per tablet Take 1 tablet by mouth daily.      ??? oxyCODONE (ROXICODONE) 5 MG immediate release tablet Take 1 tablet (5 mg total) by mouth every eight (8) hours as needed for pain. 20 tablet 0   ??? pantoprazole (PROTONIX) 40 MG tablet Take by mouth daily at 0600.      ??? pravastatin (PRAVACHOL) 80 MG tablet TAKE 1 TABLET BY MOUTH EVERY NIGHT AT BEDTIME     ??? predniSONE (DELTASONE) 10 MG tablet 6 TABS X 1 DAY, 5 TABS X 1 DAY, 4 TABS X 1 DAY, ETC...  0   ??? ranitidine (ZANTAC) 150 MG capsule Take 1 capsule (150 mg total) by mouth Two (2) times a day. 180 capsule 3   ??? sertraline (ZOLOFT) 50 MG tablet Take 50 mg by mouth daily.  1   ??? topiramate (TOPAMAX) 50 MG tablet Take 50 mg by mouth daily.  3   ??? venetoclax (VENCLEXTA STARTING PACK) 10 mg-50 mg- 100 mg tablet TAKE BY MOUTH AS DIRECTED ON PACKAGE LABELING 42 each 0   ??? venetoclax (VENCLEXTA) 100 mg tablet TAKE 4 TABLETS BY MOUTH ONCE DAILY WITH A MEAL AND WATER - DO NOT CHEW CRUSH OR BREAK TABLETS 120 each 6     No  current facility-administered medications for this visit.      Physical exam:  There were no vitals filed for this visit.  Constitutional: Resting, in no apparent distress  Eyes: PERRL. No scleral icterus or conjunctival injection.  Ear/nose/mouth/throat: Oral mucosa without ulceration, erythema or exudate.   Hematology/lymphatic/immunologic:  No lymphadenopathy in the anterior/posterior cervical, supraclavicular basins.  Cardiovascular:  RRR.  S1, S2.  No murmurs, gallops or rubs. Appear well-perfused.  No clubbing or cyanosis. Trace edema to bilateral ankles and feet. Respiratory:  Breathing is unlabored, and patient is speaking full sentences with ease.  No stridor.  CTAB. No rales, ronchi or crackles.    GI:  No distention or pain on palpation.  Bowel sounds are present and normal in quality.  No palpable hepatomegaly or splenomegaly.  No palpable masses.  No pain on palpation of RUQ. Murphy's sign negative.  GU: not examined  Musculoskeletal: +point tenderness on palpation of right posterior thorax, right lower rib and coccyx. No pain on palpation of the spinous processes of the cervical, thoracic or lumbar vertebral bodies. No grossly-evident joint effusions or deformities.  Range of motion about the shoulder, elbow, hips and knees is grossly normal.    Skin:  No rashes, petechiae or purpura.  No areas of skin breakdown. Warm to touch, dry, smooth and even.  Neurologic:  Gait is normal.  Cerebellar tasks are completed with ease and are symmetric.  Psychiatric:  Alert and oriented to person, place, time and situation.  Range of affect is appropriate.      ECOG Performance Status: 1    Results:  Labs and pathology have been reviewed and pertinent results are as follows:    Results for orders placed or performed in visit on 01/15/18   CBC w/ Differential   Result Value Ref Range    WBC 1.9 (L) 4.5 - 11.0 10*9/L    RBC 3.74 (L) 4.00 - 5.20 10*12/L    HGB 11.8 (L) 12.0 - 16.0 g/dL    HCT 29.5 (L) 62.1 - 46.0 %    MCV 91.7 80.0 - 100.0 fL    MCH 31.4 26.0 - 34.0 pg    MCHC 34.2 31.0 - 37.0 g/dL    RDW 30.8 (H) 65.7 - 15.0 %    MPV 8.8 7.0 - 10.0 fL    Platelet 28 (L) 150 - 440 10*9/L    Neutrophils % 40.5 %    Lymphocytes % 47.5 %    Monocytes % 9.3 %    Eosinophils % 0.3 %    Basophils % 0.4 %    Neutrophil Left Shift 1+ (A) Not Present    Absolute Neutrophils 0.8 (L) 2.0 - 7.5 10*9/L    Absolute Lymphocytes 0.9 (L) 1.5 - 5.0 10*9/L    Absolute Monocytes 0.2 0.2 - 0.8 10*9/L    Absolute Eosinophils 0.0 0.0 - 0.4 10*9/L    Absolute Basophils 0.0 0.0 - 0.1 10*9/L    Large Unstained Cells 2 0 - 4 %    Macrocytosis Slight (A) Not Present    Anisocytosis Moderate (A) Not Present

## 2018-01-15 NOTE — Unmapped (Addendum)
Please do not take your venetoclax until we see you next Tuesday.  We will wait to give you your infusion drug until next week.    Your platelets and neutrophil count (bacteria fighting cells) are low, likely due to your chemotherapy.  For that reason, we will not give you infusion or have you take your pills.    Today and next week, we will give you a shot called Granix, which will help your white blood cells (specifically neutrophils) grow again.    We'll get a CT of your belly to see if there's anything of concern causing you pain.    Please take imodium as needed with a loose stool.    Please wait to have your crown and tooth removed.    Please return in 1 week for a follow up visit and labs.    If you have any questions, please do not hesitate to contact us.    Please contact us for any of these symptoms:    **Fevers over 100.4 are an EMERGENCY. Please go to the nearest ER**    1. New bone pain  2. Unintentional weight loss  3. Unexplained fatigue  4. Swelling of your legs  5. Yellowing of the skin or eyes    When reviewing your results, please remember that the results of many of the tests we order can vary somewhat and that variation often means nothing.  Sometimes when we get results back after your clinic visit, if it looks like there???s some variation of that type, we may decide to recheck things sooner than we discussed in clinic.  If you get a call that we want to recheck things sooner, do not panic. It does not mean that things are going wrong.    For appointments & questions Monday through Friday 8 AM??? 5 PM   please call [phone] or Toll free [phone].    On Nights, Weekends and Holidays  Call [phone] and ask for the adult hematologist/oncologist on call.    Markus Jarvis, AGPCNP-C, MSN, OCN  Nurse Practitioner  Hematologic Malignancies  [facility]    Nurse Navigator: Frankey Poot, RN  Questions and appointments M-F 8am - 5pm: [phone] or [phone]    [facility]  [address]  [place], [place] [address]  [email or web address]    Results for orders placed or performed in visit on 01/15/18   Comprehensive Metabolic Panel   Result Value Ref Range    Sodium 140 135 - 145 mmol/L    Potassium 4.0 3.5 - 5.0 mmol/L    Chloride 109 (H) 98 - 107 mmol/L    CO2 24.0 22.0 - 30.0 mmol/L    BUN 11 7 - 21 mg/dL    Creatinine 3.66 4.40 - 1.00 mg/dL    BUN/Creatinine Ratio 13     EGFR CKD-EPI Non-African American, Female 65 >=60 mL/min/1.93m2    EGFR CKD-EPI African American, Female 75 >=60 mL/min/1.2m2    Glucose 90 65 - 179 mg/dL    Calcium 9.0 8.5 - 34.7 mg/dL    Albumin 3.5 3.5 - 5.0 g/dL    Total Protein 5.9 (L) 6.5 - 8.3 g/dL    Total Bilirubin 0.7 0.0 - 1.2 mg/dL    AST 23 14 - 38 U/L    ALT 18 15 - 48 U/L    Alkaline Phosphatase 73 38 - 126 U/L    Anion Gap 7 (L) 9 - 15 mmol/L   Uric acid   Result Value  Ref Range    Uric Acid 3.9 3.0 - 6.5 mg/dL   Phosphorus Level   Result Value Ref Range    Phosphorus 3.5 2.9 - 4.7 mg/dL   CBC w/ Differential   Result Value Ref Range    WBC 1.9 (L) 4.5 - 11.0 10*9/L    RBC 3.74 (L) 4.00 - 5.20 10*12/L    HGB 11.8 (L) 12.0 - 16.0 g/dL    HCT 29.5 (L) 62.1 - 46.0 %    MCV 91.7 80.0 - 100.0 fL    MCH 31.4 26.0 - 34.0 pg    MCHC 34.2 31.0 - 37.0 g/dL    RDW 30.8 (H) 65.7 - 15.0 %    MPV 8.8 7.0 - 10.0 fL    Platelet 28 (L) 150 - 440 10*9/L    Neutrophils % 40.5 %    Lymphocytes % 47.5 %    Monocytes % 9.3 %    Eosinophils % 0.3 %    Basophils % 0.4 %    Neutrophil Left Shift 1+ (A) Not Present    Absolute Neutrophils 0.8 (L) 2.0 - 7.5 10*9/L    Absolute Lymphocytes 0.9 (L) 1.5 - 5.0 10*9/L    Absolute Monocytes 0.2 0.2 - 0.8 10*9/L    Absolute Eosinophils 0.0 0.0 - 0.4 10*9/L    Absolute Basophils 0.0 0.0 - 0.1 10*9/L    Large Unstained Cells 2 0 - 4 %    Macrocytosis Slight (A) Not Present    Anisocytosis Moderate (A) Not Present

## 2018-01-15 NOTE — Unmapped (Signed)
1215:  Labs drawn and sent for analysis.  Care provided by  Adela Ports, RN

## 2018-01-15 NOTE — Unmapped (Signed)
Port flushed with heparin and deaccessed. Granix given into left arm. Pt left ambulatory and stable

## 2018-01-16 ENCOUNTER — Ambulatory Visit: Admit: 2018-01-16 | Discharge: 2018-01-19 | Payer: MEDICARE

## 2018-01-16 DIAGNOSIS — S0101XA Laceration without foreign body of scalp, initial encounter: Principal | ICD-10-CM

## 2018-01-16 LAB — CBC W/ AUTO DIFF
BASOPHILS ABSOLUTE COUNT: 0 10*9/L (ref 0.0–0.1)
BASOPHILS ABSOLUTE COUNT: 0 10*9/L (ref 0.0–0.1)
BASOPHILS RELATIVE PERCENT: 0.4 %
EOSINOPHILS ABSOLUTE COUNT: 0 10*9/L (ref 0.0–0.4)
EOSINOPHILS ABSOLUTE COUNT: 0 10*9/L (ref 0.0–0.4)
EOSINOPHILS RELATIVE PERCENT: 0.1 %
EOSINOPHILS RELATIVE PERCENT: 0 %
HEMATOCRIT: 37.5 % (ref 36.0–46.0)
HEMATOCRIT: 30.2 % — ABNORMAL LOW (ref 36.0–46.0)
HEMOGLOBIN: 12.2 g/dL (ref 12.0–16.0)
HEMOGLOBIN: 10.4 g/dL — ABNORMAL LOW (ref 12.0–16.0)
LARGE UNSTAINED CELLS: 2 % (ref 0–4)
LARGE UNSTAINED CELLS: 3 % (ref 0–4)
LYMPHOCYTES ABSOLUTE COUNT: 0.9 10*9/L — ABNORMAL LOW (ref 1.5–5.0)
LYMPHOCYTES ABSOLUTE COUNT: 0.3 10*9/L — ABNORMAL LOW (ref 1.5–5.0)
LYMPHOCYTES RELATIVE PERCENT: 18.2 %
LYMPHOCYTES RELATIVE PERCENT: 8.9 %
MEAN CORPUSCULAR HEMOGLOBIN CONC: 32.6 g/dL (ref 31.0–37.0)
MEAN CORPUSCULAR HEMOGLOBIN CONC: 34.6 g/dL (ref 31.0–37.0)
MEAN CORPUSCULAR HEMOGLOBIN: 30.6 pg (ref 26.0–34.0)
MEAN CORPUSCULAR HEMOGLOBIN: 32.3 pg (ref 26.0–34.0)
MEAN CORPUSCULAR VOLUME: 93.8 fL (ref 80.0–100.0)
MEAN CORPUSCULAR VOLUME: 93.5 fL (ref 80.0–100.0)
MEAN PLATELET VOLUME: 10.4 fL — ABNORMAL HIGH (ref 7.0–10.0)
MEAN PLATELET VOLUME: 9.9 fL (ref 7.0–10.0)
MONOCYTES ABSOLUTE COUNT: 0.4 10*9/L (ref 0.2–0.8)
MONOCYTES RELATIVE PERCENT: 7.6 %
NEUTROPHILS ABSOLUTE COUNT: 3.6 10*9/L (ref 2.0–7.5)
NEUTROPHILS ABSOLUTE COUNT: 2.7 10*9/L (ref 2.0–7.5)
NEUTROPHILS RELATIVE PERCENT: 71.3 %
NEUTROPHILS RELATIVE PERCENT: 80.6 %
PLATELET COUNT: 39 10*9/L — ABNORMAL LOW (ref 150–440)
PLATELET COUNT: 18 10*9/L — ABNORMAL LOW (ref 150–440)
RED BLOOD CELL COUNT: 3.99 10*12/L — ABNORMAL LOW (ref 4.00–5.20)
RED BLOOD CELL COUNT: 3.23 10*12/L — ABNORMAL LOW (ref 4.00–5.20)
RED CELL DISTRIBUTION WIDTH: 18.6 % — ABNORMAL HIGH (ref 12.0–15.0)
RED CELL DISTRIBUTION WIDTH: 18.8 % — ABNORMAL HIGH (ref 12.0–15.0)
WBC ADJUSTED: 5 10*9/L (ref 4.5–11.0)
WBC ADJUSTED: 3.4 10*9/L — ABNORMAL LOW (ref 4.5–11.0)

## 2018-01-16 LAB — URINALYSIS WITH CULTURE REFLEX
BACTERIA: NONE SEEN /HPF
BILIRUBIN UA: NEGATIVE
GLUCOSE UA: NEGATIVE
KETONES UA: NEGATIVE
LEUKOCYTE ESTERASE UA: NEGATIVE
NITRITE UA: NEGATIVE
PH UA: 7.5 (ref 5.0–9.0)
SPECIFIC GRAVITY UA: 1.013 (ref 1.003–1.030)
SQUAMOUS EPITHELIAL: 1 /HPF (ref 0–5)
UROBILINOGEN UA: 0.2
WBC UA: <1 /HPF (ref 0–5)

## 2018-01-16 LAB — TOXICOLOGY SCREEN, URINE
AMPHETAMINE SCREEN URINE: <500
BARBITURATE SCREEN URINE: <200
BENZODIAZEPINE SCREEN, URINE: <200
METHADONE SCREEN, URINE: <300

## 2018-01-16 LAB — BASIC METABOLIC PANEL
BUN / CREAT RATIO: 14
CALCIUM: 9.3 mg/dL (ref 8.5–10.2)
CHLORIDE: 110 mmol/L — ABNORMAL HIGH (ref 98–107)
CO2: 20 mmol/L — ABNORMAL LOW (ref 22.0–30.0)
CREATININE: 0.92 mg/dL (ref 0.60–1.00)
EGFR CKD-EPI AA FEMALE: 68 mL/min/{1.73_m2} (ref >=60)
EGFR CKD-EPI NON-AA FEMALE: 59 mL/min/{1.73_m2} — ABNORMAL LOW (ref >=60)
GLUCOSE RANDOM: 123 mg/dL (ref 65–179)
POTASSIUM: 4.3 mmol/L (ref 3.5–5.0)
SODIUM: 143 mmol/L (ref 135–145)

## 2018-01-16 LAB — LACTATE BLOOD VENOUS
Lactate:SCnc:Pt:BldV:Qn:: 1.4
Lactate:SCnc:Pt:BldV:Qn:: 5.4 — ABNORMAL HIGH

## 2018-01-16 LAB — APTT: APTT: 22.5 s — ABNORMAL LOW (ref 25.9–39.5)

## 2018-01-16 LAB — POTASSIUM: Potassium:SCnc:Pt:Ser/Plas:Qn:: 4.3

## 2018-01-16 LAB — BLOOD GAS, VENOUS
BASE EXCESS VENOUS: -4 — ABNORMAL LOW (ref -2.0–2.0)
HCO3 VENOUS: 22 mmol/L (ref 22–27)
PCO2 VENOUS: 46 mmHg (ref 40–60)
PH VENOUS: 7.29 — ABNORMAL LOW (ref 7.32–7.43)
PO2 VENOUS: <20 mmHg — ABNORMAL LOW (ref 30–55)

## 2018-01-16 LAB — SPECIMEN SOURCE

## 2018-01-16 LAB — SMEAR REVIEW

## 2018-01-16 LAB — HEPARIN CORRELATION: Lab: <0.2

## 2018-01-16 LAB — SODIUM: Sodium:SCnc:Pt:Ser/Plas:Qn:: 143

## 2018-01-16 LAB — INR: Lab: 1.01

## 2018-01-16 LAB — PROTIME-INR
INR: 1.01
PROTIME: 11.6 s (ref 10.2–13.1)

## 2018-01-16 LAB — TROPONIN I
Troponin I.cardiac:MCnc:Pt:Ser/Plas:Qn:: <0.034
Troponin I.cardiac:MCnc:Pt:Ser/Plas:Qn:: <0.034

## 2018-01-16 LAB — CREATININE: Creatinine:MCnc:Pt:Ser/Plas:Qn:: 0.92

## 2018-01-16 LAB — CLARITY

## 2018-01-16 LAB — CO2: Carbon dioxide:SCnc:Pt:Ser/Plas:Qn:: 20 — ABNORMAL LOW

## 2018-01-16 LAB — ETHANOL: Ethanol:MCnc:Pt:Ser/Plas:Qn:GC: <10

## 2018-01-16 LAB — GLUCOSE RANDOM: Glucose:MCnc:Pt:Ser/Plas:Qn:: 123

## 2018-01-16 LAB — MEAN PLATELET VOLUME: Lab: 10.4 — ABNORMAL HIGH

## 2018-01-16 LAB — RED BLOOD CELL COUNT: Lab: 3.23 — ABNORMAL LOW

## 2018-01-16 LAB — BENZODIAZEPINE SCREEN, URINE: Lab: <200

## 2018-01-16 NOTE — Unmapped (Signed)
Urine deferred at this time due to pt condition will get Imaging and upon return get urine specimen

## 2018-01-16 NOTE — Unmapped (Signed)
Xray performed:  Portable Chest and Portable Pelvis

## 2018-01-16 NOTE — Unmapped (Signed)
[facility] Emergency Department Physician Note    ED CLINICAL IMPRESSION:     Final diagnoses:   Laceration of scalp, initial encounter (Primary)     ASSESSMENT:     IMPRESSION & PLAN: This is a 77 y.o. female with a history of leukemia with a platelet count of 28 who presents as a YELLOW TRAUMA after rolling off of bed and striking the her head on a nightstand. At presentation vitals are stable. The patient was evaluated contemporaneously with the trauma team. On arrival, airway was noted to be intact. Lungs sounds were found to be clear. Femoral pulses were equal bilaterally. The patient was noted to have a GCS of 15 on arrival. The patient was exposed for the secondary survey which revealed a 4cm x 2cm gaping laceration to her left scalp, no other evidence of facial trauma and some TTP over the right posterior hip with overlying ecchymosis. Imaging studies were performed and reviewed which demonstrated no acute fractures. The patients laceration was repaired in the Ed and she was admitted to trauma surgery.   ____________________________________________    I have reviewed the triage vital signs and the nursing notes.  I have discussed the case with the ED Attending, Dr. Tresa Garter, MD.     MEDICAL HISTORY:      TIME SEEN:  01/16/2018     CHIEF COMPLAINT: Fall    HISTORY OF PRESENT ILLNESS:   Katrina Gould is a 77 y.o. female who presents to the [facility] ED today as a YELLOW TRAUMA after she fell out of bed and struck her head on a night stand. Per EMS report, the patient sustained a left temporal laceration. She has a platelet count of 28 with hx of cancer. She complains of some chest pain and has hx of a LBBB. She denies any neck pain or loss of consciousness. Patient is alert and oriented on arrival, VSS.     History and Review of Systems Limited By: Nothing    REVIEW OF SYSTEMS:   General/Constitutional: Negative for fever.  HEENT:  Negative for eye drainage.  Cardiovascular: Positive for chest pain.  Respiratory: Negative for shortness of breath.  Gastrointestinal: Negative for abdominal pain. Negative for vomiting.  Genitourinary: Negative for dysuria.  Musculoskeletal: Negative for back pain.  Integumentary: See HPI  Neurologic: Negative for headache.  Psychiatric: Negative for hallucinations.  Hematologic: Negative for easy bruising.  Review of systems is otherwise negative.    PAST MEDICAL HISTORY:  Past Medical History:   Diagnosis Date   ??? Leukemia (CMS-HCC)    ??? Non-Hodgkin lymphoma (CMS-HCC)      SURGICAL HISTORY:  has a past surgical history that includes IR Insert Port Age Greater Than 5 Years (10/31/2017).    OUTPATIENT MEDICATIONS:  Prior to Admission medications    Medication Sig Start Date End Date Taking? Authorizing Provider   allopurinol (ZYLOPRIM) 300 MG tablet Take 1 tablet (300 mg total) by mouth daily. 10/16/17 10/16/18  Pernell Dupre, MD   fesoterodine (TOVIAZ) 4 mg 24 hr tablet Take by mouth. 07/24/17   Historical Provider, MD   fluticasone propion-salmeterol (ADVAIR) 100-50 mcg/dose diskus Inhale.    Historical Provider, MD   lidocaine (LIDODERM) 5 % patch Place 1 patch on the skin daily. Apply to affected area for 12 hours only each day (then remove patch) 12/04/17 01/15/18  Thyra Breed, NP   LORazepam (ATIVAN) 0.5 MG tablet Take 0.5 mg by mouth two (2) times a day  as needed.  03/06/16   Historical Provider, MD   mirtazapine (REMERON) 7.5 MG tablet Take 7.5 mg by mouth nightly.    Historical Provider, MD   multivitamin (TAB-A-VITE/THERAGRAN) per tablet Take 1 tablet by mouth daily.     Historical Provider, MD   oxyCODONE (ROXICODONE) 5 MG immediate release tablet Take 1 tablet (5 mg total) by mouth every eight (8) hours as needed for pain. 11/27/17 11/27/18  Adine Madura Sawchak, AGNP   pantoprazole (PROTONIX) 40 MG tablet Take by mouth daily at 0600.  09/23/15   Historical Provider, MD   pravastatin (PRAVACHOL) 80 MG tablet TAKE 1 TABLET BY MOUTH EVERY NIGHT AT BEDTIME 02/10/16   Historical Provider, MD   predniSONE (DELTASONE) 10 MG tablet 6 TABS X 1 DAY, 5 TABS X 1 DAY, 4 TABS X 1 DAY, ETC... 11/23/17   Historical Provider, MD   ranitidine (ZANTAC) 150 MG capsule Take 1 capsule (150 mg total) by mouth Two (2) times a day. 01/01/18 04/01/18  Hardie Pulley, MD   sertraline (ZOLOFT) 50 MG tablet Take 50 mg by mouth daily. 11/23/17   Historical Provider, MD   topiramate (TOPAMAX) 50 MG tablet Take 50 mg by mouth daily. 11/23/17   Historical Provider, MD   venetoclax (VENCLEXTA STARTING PACK) 10 mg-50 mg- 100 mg tablet TAKE BY MOUTH AS DIRECTED ON PACKAGE LABELING 10/16/17 10/16/18  Pernell Dupre, MD   venetoclax (VENCLEXTA) 100 mg tablet TAKE 4 TABLETS BY MOUTH ONCE DAILY WITH A MEAL AND WATER - DO NOT CHEW CRUSH OR BREAK TABLETS 10/23/17 10/23/18  Pernell Dupre, MD     ALLERGIES: is allergic to sertraline; topiramate; and baclofen.    FAMILY HISTORY: family history is not on file.    SOCIAL HISTORY:  Tobacco use:  reports that she has never smoked. She has never used smokeless tobacco.   Alcohol use:  reports that she does not drink alcohol.  Drug use:  reports that she does not use drugs.    PHYSICAL EXAM:     ED Vital Signs:  Vitals:    01/16/18 1415 01/16/18 1417 01/16/18 1427   BP: 156/88  75/62   Pulse: 93  73   Resp: 17     Temp:  36.6 ??C (97.8 ??F)    TempSrc:  Skin    SpO2: 100%       Constitutional: Alert and oriented and responds appropriately to questions.   Head: Normocephalic. 4 x 2 cm gaping laceration to left scalp. No other evidence of facial trauma.   Eyes: Conjunctivae clear, pupils are equal, round and reactive to light bilaterally  ENT: Normal nose with no swelling or hematoma; clear nares, no visible dental trauma, no malocclusion, midface is stable, no hemotympanum  Neck: C-collar present, No midline c-spine tenderness and Supple  Cardiovascular: RRR; no murmurs, palpable and symmetic radial and femoral pulses   Chest Wall: No crepitus or hematoma, equal chest rise  Pulmonary: Normal chest excursion without splinting or tachypnea; breath sounds clear and equal bilaterally; no wheezes, no rhonchi, no rales,   Abdominal/GI: No external trauma, non-distended; soft, non-tender, no rebound, no guarding. Good rectal tone.   Back:  The patient was log rolled, no deformities, step offs or midline tenderness noted in the C, T and L-spine. No obvious trauma.  Pelvis: Pelvis is stable. Tender to palpation over the right posterior hip with some overlying ecchymosis.   Extremities: Normal ROM in all joints; non-tender to palpation, no obvious deformity,  no edema. No lower extremity lacerations.  Integumentary: Normal color for age and race; warm  Neurologic: GCS: 15 (4 - Opens eyes on own 5 - Alert and oriented 6 - Follows simple motor commands), Equally moves all extremities and follows commands, sensation intact to light touch in all extremeties  Psychiatric: Mood and manner are appropriate.     LABORATORY DATA:     Results for orders placed or performed during the hospital encounter of 01/16/18   Basic Metabolic Panel   Result Value Ref Range    Sodium 143 135 - 145 mmol/L    Potassium 4.3 3.5 - 5.0 mmol/L    Chloride 110 (H) 98 - 107 mmol/L    CO2 20.0 (L) 22.0 - 30.0 mmol/L    Anion Gap 13 9 - 15 mmol/L    BUN 13 7 - 21 mg/dL    Creatinine 1.61 0.96 - 1.00 mg/dL    BUN/Creatinine Ratio 14     EGFR CKD-EPI Non-African American, Female 59 (L) >=60 mL/min/1.58m2    EGFR CKD-EPI African American, Female 44 >=60 mL/min/1.77m2    Glucose 123 65 - 179 mg/dL    Calcium 9.3 8.5 - 04.5 mg/dL   Blood Gas, Venous (Nurse Draw)   Result Value Ref Range    Specimen Source Venous     FIO2 Venous Not Specified     pH, Venous 7.29 (L) 7.32 - 7.43    pCO2, Ven 46 40 - 60 mm Hg    pO2, Ven <20 (L) 30 - 55 mm Hg    HCO3, Ven 22 22 - 27 mmol/L    Base Excess, Ven -4.0 (L) -2.0 - 2.0    O2 Saturation, Venous 15.0 (L) 40.0 - 85.0 %   Sodium   Result Value Ref Range Sodium 143 135 - 145 mmol/L   Potassium Level   Result Value Ref Range    Potassium 4.3 3.5 - 5.0 mmol/L   Glucose, Random   Result Value Ref Range    Glucose 123 65 - 179 mg/dL   Lactic Acid, Venous, Whole Blood   Result Value Ref Range    Lactate, Venous 5.4 (H) 0.5 - 1.8 mmol/L   CO2 Level   Result Value Ref Range    CO2 20.0 (L) 22.0 - 30.0 mmol/L   Ethanol   Result Value Ref Range    Alcohol, Ethyl <10.0 Undefined mg/dL   Protime-INR   Result Value Ref Range    PT 11.6 10.2 - 13.1 sec    INR 1.01    aPTT   Result Value Ref Range    APTT 22.5 (L) 25.9 - 39.5 sec    Heparin Correlation <0.2    Urinalysis with Culture Reflex   Result Value Ref Range    Color, UA Yellow     Clarity, UA Clear     Specific Gravity, UA 1.013 1.003 - 1.030    pH, UA 7.5 5.0 - 9.0    Leukocyte Esterase, UA Negative Negative    Nitrite, UA Negative Negative    Protein, UA Negative Negative    Glucose, UA Negative Negative    Ketones, UA Negative Negative    Urobilinogen, UA 0.2 mg/dL 0.2 mg/dL, 1.0 mg/dL    Bilirubin, UA Negative Negative    Blood, UA Negative Negative    RBC, UA 3 <=4 /HPF    WBC, UA <1 0 - 5 /HPF    Squam Epithel, UA 1 0 - 5 /HPF  Bacteria, UA None Seen None Seen /HPF    Mucus, UA Rare (A) None Seen /HPF   Toxicology Screen, Urine   Result Value Ref Range    Amphetamine Screen, Ur <500 ng/mL Not Applicable    Barbiturate Screen, Ur <200 ng/mL Not Applicable    Benzodiazepine Screen, Urine <200 ng/mL Not Applicable    Cannabinoid Scrn, Ur <20 ng/mL Not Applicable    Methadone Screen, Urine <300 ng/mL Not Applicable    Cocaine(Metab.)Screen, Urine <150 ng/mL Not Applicable    Opiate Scrn, Ur <300 ng/mL Not Applicable   Troponin I   Result Value Ref Range    Troponin I <0.034 <0.034 ng/mL   Lactic Acid, Venous, Whole Blood   Result Value Ref Range    Lactate, Venous 1.4 0.5 - 1.8 mmol/L   Troponin I   Result Value Ref Range    Troponin I <0.034 <0.034 ng/mL   ECG 12 Lead   Result Value Ref Range    EKG Systolic BP  mmHg EKG Diastolic BP  mmHg    EKG Ventricular Rate 85 BPM    EKG Atrial Rate 85 BPM    EKG P-R Interval 196 ms    EKG QRS Duration 138 ms    EKG Q-T Interval 422 ms    EKG QTC Calculation 502 ms    EKG Calculated P Axis 53 degrees    EKG Calculated R Axis -23 degrees    EKG Calculated T Axis 132 degrees    QTC Fredericia 474 ms   Type and Screen   Result Value Ref Range    ABO Grouping B NEG     Antibody Screen NEG    Prepare Platelet Pheresis   Result Value Ref Range    Unit Blood Type A Pos     ISBT Number 6200     Unit # [id number]     Status Ready     Product ID Platelets     PRODUCT CODE [id number]    CBC w/ Differential   Result Value Ref Range    WBC 5.0 4.5 - 11.0 10*9/L    RBC 3.99 (L) 4.00 - 5.20 10*12/L    HGB 12.2 12.0 - 16.0 g/dL    HCT 56.3 87.5 - 64.3 %    MCV 93.8 80.0 - 100.0 fL    MCH 30.6 26.0 - 34.0 pg    MCHC 32.6 31.0 - 37.0 g/dL    RDW 32.9 (H) 51.8 - 15.0 %    MPV 10.4 (H) 7.0 - 10.0 fL    Platelet 39 (L) 150 - 440 10*9/L    Variable HGB Concentration Slight (A) Not Present    Neutrophils % 71.3 %    Lymphocytes % 18.2 %    Monocytes % 8.0 %    Eosinophils % 0.1 %    Basophils % 0.2 %    Neutrophil Left Shift 3+ (A) Not Present    Absolute Neutrophils 3.6 2.0 - 7.5 10*9/L    Absolute Lymphocytes 0.9 (L) 1.5 - 5.0 10*9/L    Absolute Monocytes 0.4 0.2 - 0.8 10*9/L    Absolute Eosinophils 0.0 0.0 - 0.4 10*9/L    Absolute Basophils 0.0 0.0 - 0.1 10*9/L    Large Unstained Cells 2 0 - 4 %    Macrocytosis Slight (A) Not Present    Anisocytosis Moderate (A) Not Present    Hypochromasia Slight (A) Not Present   Morphology Review   Result Value Ref Range  Smear Review Comments See Comment (A) Undefined   CBC w/ Differential   Result Value Ref Range    Results Verified by Slide Scan Slide Reviewed     WBC 3.4 (L) 4.5 - 11.0 10*9/L    RBC 3.23 (L) 4.00 - 5.20 10*12/L    HGB 10.4 (L) 12.0 - 16.0 g/dL    HCT 96.2 (L) 95.2 - 46.0 %    MCV 93.5 80.0 - 100.0 fL    MCH 32.3 26.0 - 34.0 pg    MCHC 34.6 31.0 - 37.0 g/dL    RDW 84.1 (H) 32.4 - 15.0 %    MPV 9.9 7.0 - 10.0 fL    Platelet 18 (L) 150 - 440 10*9/L    Variable HGB Concentration Slight (A) Not Present    Neutrophils % 80.6 %    Lymphocytes % 8.9 %    Monocytes % 7.6 %    Eosinophils % 0.0 %    Basophils % 0.4 %    Neutrophil Left Shift 2+ (A) Not Present    Absolute Neutrophils 2.7 2.0 - 7.5 10*9/L    Absolute Lymphocytes 0.3 (L) 1.5 - 5.0 10*9/L    Absolute Monocytes 0.3 0.2 - 0.8 10*9/L    Absolute Eosinophils 0.0 0.0 - 0.4 10*9/L    Absolute Basophils 0.0 0.0 - 0.1 10*9/L    Large Unstained Cells 3 0 - 4 %    Macrocytosis Slight (A) Not Present    Anisocytosis Moderate (A) Not Present    Hypochromasia Slight (A) Not Present     Xr Chest Portable    Result Date: 01/16/2018  EXAM: XR CHEST PORTABLE DATE: 01/17/1999 10:41 PM ACCESSION: [id number] UN DICTATED: 01/16/2018 2:22 PM INTERPRETATION LOCATION: [facility] CLINICAL INDICATION: 77 years old Female with Trauma  COMPARISON: 08/23/2017 and 10/22/2017 TECHNIQUE: Portable Chest Radiograph. FINDINGS: Right chest port catheter terminates over the cavoatrial junction. Decreased lung volumes with crowded lung markings. No focal consolidation. No pleural effusion or pneumothorax Apparent widening of superior mediastinum, likely attributable to projection and patient positioning. Moderate to large hiatal hernia. Chronic left seventh rib deformity. No acute osseous abnormality.     No acute airspace disease or osseous abnormality. Apparent widening of superior mediastinum, likely attributable to projection and patient positioning. Recommend repeat radiograph with proper positioning or chest CT if there is high index of clinical suspicion for thoracic trauma PA    Ct Head Wo Contrast    Result Date: 01/16/2018  EXAM: Computed tomography, head or brain without contrast material. DATE: 01/16/2018 2:39 PM ACCESSION: [id number] UN DICTATED: 01/16/2018 3:03 PM INTERPRETATION LOCATION: [facility] CLINICAL INDICATION: 77 years old Female with Trauma  COMPARISON: None TECHNIQUE: Axial CT images of the head  from skull base to vertex without contrast. FINDINGS: There are scattered and confluent hypodense foci within the periventricular and deep white matter. These are nonspecific but commonly associated with small vessel ischemic changes. Mild cerebral atrophy. There is no midline shift or mass lesion.  There is no evidence of intracranial hemorrhage or acute infarct. No fractures are evident.  The sinuses are pneumatized.     - No acute intracranial abnormality.    Ct Cervical Spine Screening    Result Date: 01/16/2018  EXAM: CT CERVICAL SPINE SCREENING DATE: 01/16/2018 2:39 PM ACCESSION: [id number] UN DICTATED: 01/16/2018 2:47 PM [facility]PRETATION LOCATION: [facility] CLINICAL INDICATION: 77 years old Female with Trauma  COMPARISON: Correlation with same-day head CT and outside cervical spine MRI dated 08/23/2017 TECHNIQUE: A spiral CT scan of  the cervical spine was obtained without administration of IV contrast. Contiguous axial, coronal and sagittal bone algorithm images were reconstructed at 2-mm increments.] FINDINGS: Bones are osteopenic. Old fracture deformity of C7 spinous process. No acute fracture. No listhesis. There is diffuse cervical and upper thoracic disc space narrowing most pronounced and moderate to severe at C5-C6, C6-C7, and C7-T1. There are discogenic endplate changes of sclerosis and osteophytosis. There is moderate multilevel facet joint narrowing with sclerosis and osseous overgrowth. Skull base is intact. No sizable paraspinal hematoma. There are atherosclerotic calcifications of the carotid arteries and visualized aortic arch. Partially visualized right sided Mediport catheter tubing.     No acute fracture or traumatic malalignment of the cervical spine. Multilevel cervical spine degenerative disc disease, moderate to severe at C5-C6, C6-C7, and C7-T1, and moderate multilevel facet arthropathy.    Xr Trauma Hip Right Result Date: 01/16/2018  EXAM: XR TRAUMA HIP RIGHT DATE: 01/16/2018 3:26 PM ACCESSION: [id number] UN DICTATED: 01/16/2018 3:32 PM [facility]PRETATION LOCATION: [facility] CLINICAL INDICATION: 77 years old Female with bruising and ttp over posterior right hip s/p fall. eval for fracture.  COMPARISON: Pelvis radiographs 01/16/2018; CT abdomen pelvis with contrast 10/29/2017 TECHNIQUE: AP views of the pelvis and right femur and cross table lateral views. FINDINGS: No acute fracture. Mild femoral acetabular joint space narrowing with osseous overgrowth of the acetabular roof. Knee is approximated. Single AP view of the left hip reveals no acute osseous abnormality, with mild femoral acetabular joint space narrowing and osseous overgrowth of the acetabular roof. Sacroiliac joints and pubic symphysis are approximated. Nonobstructive bowel gas pattern.     Mild right hip osteoarthrosis. No acute osseous abnormality.    Xr Pelvis Ap Portable    Result Date: 01/16/2018  EXAM: XR PELVIS 1 OR 2 VIEWS DATE: 01/16/2018 2:24 PM ACCESSION: [id number] UN DICTATED: 01/16/2018 2:26 PM INTERPRETATION LOCATION: [facility] CLINICAL INDICATION: 77 years old Female with Trauma  COMPARISON: None. TECHNIQUE: AP view of the pelvis. FINDINGS: No acute fracture or dislocation. Hip joint spaces are preserved with small marginal osteophytes bilaterally. Mild degenerative change of the SI joints.     No acute osseous findings.        Pertinent labs & imaging results that were available during my care of the patient were reviewed by me and considered in my medical decision making. Labs and radiology studies included in my note may not constitute all ordered/reviewied labs during this encounter (see chart for details).    Documentation assistance was provided by Mallie Snooks, Scribe on January 16, 2018 at 2:20 PM for Lindwood Coke, MD.    Documentation assistance was provided by the scribe in my presence.  The documentation recorded by the scribe has been reviewed by me and accurately reflects the services I personally performed.          Loma Sousa, MD  Resident  01/16/18 [phone]

## 2018-01-16 NOTE — Unmapped (Signed)
Patient transported to CT Scan  Transported by Nurse and Physician  How tranported Stretcher  Cardiac Monitor yes

## 2018-01-16 NOTE — Unmapped (Signed)
Surgery History and Physical    Assessment/Plan:  Katrina Gould is a 77 y.o. 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    Injuries  - Left temporal laceration/ flap with arterial bleed. Hemostasis achieved in trauma bay with 4-0 vicryl. Patient with vagal response and became unresponsive during repair, strong femoral pulse with rate of approximately 60 bpm throughout episode. Patient awoke to painful stimuli after 30-45 sec.      Assessment/ Plan:   -CT Head with no acute intracranial bleed, no acute fracture of C-spine.  -Etiology of fall unknown. Given patient is medically ill, favor further observation.  -On reassessment, patient is complaining of right-sided chest pain.  EKG shows left bundle branch block, no significant change from previous when compared to EKG on 08/24/2017.  Troponin within normal limits.  Follow up CT chest without contrast.  - Admit to observation under [facility]-4  - Will continue home meds.  - I spoke with malignant Hematology fellow on call, will transfuse platelets with goal > 50,000. Normal Hgb transfusion parameters. Will hold Venetoclax. Hematology agrees to see in the morning.  - Will consent for platelet transfusion  - NPO, IVF  - labs  - laceration repaired at bedside    History of Present Illness:  Katrina Gould is a 77 y.o. 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 Patient hit head on nightstand yesterday evening and has not been able to control bleeding since. She was due to have chemotherapy yesterday but it was postponed due to platelet count of 28.      Allergies  Sertraline; Topiramate; and Baclofen    Medications    No current facility-administered medications for this encounter.      Current Outpatient Medications   Medication Sig Dispense Refill   ??? allopurinol (ZYLOPRIM) 300 MG tablet Take 1 tablet (300 mg total) by mouth daily. 30 tablet 2   ??? fesoterodine (TOVIAZ) 4 mg 24 hr tablet Take by mouth.     ??? fluticasone propion-salmeterol (ADVAIR) 100-50 mcg/dose diskus Inhale.     ??? LORazepam (ATIVAN) 0.5 MG tablet Take 0.5 mg by mouth two (2) times a day as needed.      ??? mirtazapine (REMERON) 7.5 MG tablet Take 7.5 mg by mouth nightly.     ??? multivitamin (TAB-A-VITE/THERAGRAN) per tablet Take 1 tablet by mouth daily.      ??? oxyCODONE (ROXICODONE) 5 MG immediate release tablet Take 1 tablet (5 mg total) by mouth every eight (8) hours as needed for pain. 20 tablet 0   ??? pantoprazole (PROTONIX) 40 MG tablet Take by mouth daily at 0600.      ??? pravastatin (PRAVACHOL) 80 MG tablet TAKE 1 TABLET BY MOUTH EVERY NIGHT AT BEDTIME     ??? predniSONE (DELTASONE) 10 MG tablet 6 TABS X 1 DAY, 5 TABS X 1 DAY, 4 TABS X 1 DAY, ETC...  0   ??? ranitidine (ZANTAC) 150 MG capsule Take 1 capsule (150 mg total) by mouth Two (2) times a day. 180 capsule 3   ??? sertraline (ZOLOFT) 50 MG tablet Take 50 mg by mouth daily.  1   ??? topiramate (TOPAMAX) 50 MG tablet Take 50 mg by mouth daily.  3   ??? venetoclax (VENCLEXTA STARTING PACK) 10 mg-50 mg- 100 mg tablet TAKE BY MOUTH AS DIRECTED ON PACKAGE LABELING 42 each 0   ???  venetoclax (VENCLEXTA) 100 mg tablet TAKE 4 TABLETS BY MOUTH ONCE DAILY WITH A MEAL AND WATER - DO NOT CHEW CRUSH OR BREAK TABLETS 120 each 6       Past Medical History  Past Medical History:   Diagnosis Date   ??? Leukemia (CMS-HCC)    ??? Non-Hodgkin lymphoma (CMS-HCC)        Past Surgical History  Past Surgical History:   Procedure Laterality Date   ??? IR INSERT PORT AGE GREATER THAN 5 YRS  10/31/2017    IR INSERT PORT AGE GREATER THAN 5 YRS 10/31/2017 Reece Levy, MD IMG VIR [facility]       Family History  The patient's family history is not on file..    Social History:  Social History     Socioeconomic History   ??? Marital status: Widowed     Spouse name: Not on file   ??? Number of children: Not on file   ??? Years of education: Not on file   ??? Highest education level: Not on file   Occupational History   ??? Not on file   Social Needs   ??? Financial resource strain: Not on file   ??? Food insecurity:     Worry: Not on file     Inability: Not on file   ??? Transportation needs:     Medical: Not on file     Non-medical: Not on file   Tobacco Use   ??? Smoking status: Never Smoker   ??? Smokeless tobacco: Never Used   Substance and Sexual Activity   ??? Alcohol use: No   ??? Drug use: No   ??? Sexual activity: Not on file   Lifestyle   ??? Physical activity:     Days per week: Not on file     Minutes per session: Not on file   ??? Stress: Not on file   Relationships   ??? Social connections:     Talks on phone: Not on file     Gets together: Not on file     Attends religious service: Not on file     Active member of club or organization: Not on file     Attends meetings of clubs or organizations: Not on file     Relationship status: Not on file   Other Topics Concern   ??? Not on file   Social History Narrative   ??? Not on file       Review of Systems  A 12 system review of systems was negative except as noted in HPI    Objective:     Vital Signs  There were no vitals filed for this visit.    Physical Exam  General Appearance:  No acute distress   Head:  Normocephalic, 4x2cm jagged laceration to left temporal region with active arterial bleed.   Eyes:  Conjuctiva and lids appear normal. Pupils equal and round,   sclera anicteric.   Ears:  Overall appearance normal Hearing is grossly normal.   Nose: Nares grossly normal, no drainage.   Throat: Lips, mucosa, and tongue normal; teeth and gums normal.   Neck: C-collar in place   Pulmonary:    Normal respiratory effort.  Lungs were clear to auscultation  bilaterally.   Cardiovascular:  Regular rate and rhythm, no murmur noted.  No thrill noted. 2+ Femoral Pulses   Chest Wall: No tenderness, no deformities   Back: No step-offs or deformities, nontender to palpation   Abdomen:  Soft, non-tender, non-distended without masses.    Rectal Normal rectal tone, no blood per rectum   Musculoskeletal: Extremities WWP with no edema, FROM. TTP and ecchymosis over posterior right hip.   Skin: Skin color, texture, turgor normal, no rashes or lesions.   Neurologic: Oriented to person, place, and time. 5/5 motor strengthSensation grossly intact. GCS 15   Psychiatric: Judgement and insight appropriate.         Test Results  All lab results last 24 hours:  No results found for this or any previous visit (from the past 24 hour(s)).    Imaging:   No results found.

## 2018-01-16 NOTE — Unmapped (Signed)
Patient rounds completed. The following patient needs were addressed:  Pain, Toileting, Personal Belongings, Plan of Care, Call Bell in Reach and Bed Position Low .

## 2018-01-16 NOTE — Unmapped (Signed)
MD at bedside. Finishing repairing head laceration.

## 2018-01-16 NOTE — Unmapped (Signed)
Paged as yellow trauma. Pt w/ hx leukemia, pancytopenic, fell out of bed and hit head on corner of bed. No LOC but EMS reports continued bleeding to head wound despite combat gauze. See trauma narrator.

## 2018-01-16 NOTE — Unmapped (Signed)
MD at bedside continuing to repair laceration.

## 2018-01-16 NOTE — Unmapped (Signed)
Surgery suturing laceration to left temporal head

## 2018-01-16 NOTE — Unmapped (Signed)
Pt responding bradycardic  On monitor to 40. HR palpable per ED MD Merril 60

## 2018-01-16 NOTE — Unmapped (Signed)
Syncopal Episode and pt unresponsive lasting approximately 30sec

## 2018-01-16 NOTE — Unmapped (Signed)
Patient transported to X-ray  Transported by Radiology  How tranported Stretcher  Cardiac Monitor yes

## 2018-01-16 NOTE — Unmapped (Signed)
Care transferred and report given to Amy, RN

## 2018-01-16 NOTE — Unmapped (Signed)
Rt hip tenderness and bruising

## 2018-01-17 DIAGNOSIS — S0101XA Laceration without foreign body of scalp, initial encounter: Principal | ICD-10-CM

## 2018-01-17 LAB — CBC
HEMATOCRIT: 27.9 % — ABNORMAL LOW (ref 36.0–46.0)
HEMATOCRIT: 27.1 % — ABNORMAL LOW (ref 36.0–46.0)
HEMATOCRIT: 28 % — ABNORMAL LOW (ref 36.0–46.0)
HEMOGLOBIN: 9.2 g/dL — ABNORMAL LOW (ref 12.0–16.0)
HEMOGLOBIN: 9.3 g/dL — ABNORMAL LOW (ref 12.0–16.0)
MEAN CORPUSCULAR HEMOGLOBIN CONC: 34.3 g/dL (ref 31.0–37.0)
MEAN CORPUSCULAR HEMOGLOBIN CONC: 33.9 g/dL (ref 31.0–37.0)
MEAN CORPUSCULAR HEMOGLOBIN: 31.9 pg (ref 26.0–34.0)
MEAN CORPUSCULAR HEMOGLOBIN: 32.3 pg (ref 26.0–34.0)
MEAN CORPUSCULAR VOLUME: 93 fL (ref 80.0–100.0)
MEAN CORPUSCULAR VOLUME: 93.7 fL (ref 80.0–100.0)
MEAN PLATELET VOLUME: 9.6 fL (ref 7.0–10.0)
MEAN PLATELET VOLUME: 10.3 fL — ABNORMAL HIGH (ref 7.0–10.0)
PLATELET COUNT: 23 10*9/L — ABNORMAL LOW (ref 150–440)
PLATELET COUNT: 23 10*9/L — ABNORMAL LOW (ref 150–440)
PLATELET COUNT: 27 10*9/L — ABNORMAL LOW (ref 150–440)
RED BLOOD CELL COUNT: 2.89 10*12/L — ABNORMAL LOW (ref 4.00–5.20)
RED CELL DISTRIBUTION WIDTH: 18.8 % — ABNORMAL HIGH (ref 12.0–15.0)
RED CELL DISTRIBUTION WIDTH: 18.9 % — ABNORMAL HIGH (ref 12.0–15.0)
RED CELL DISTRIBUTION WIDTH: 18.8 % — ABNORMAL HIGH (ref 12.0–15.0)
WBC ADJUSTED: 2.6 10*9/L — ABNORMAL LOW (ref 4.5–11.0)
WBC ADJUSTED: 2.5 10*9/L — ABNORMAL LOW (ref 4.5–11.0)
WBC ADJUSTED: 2.5 10*9/L — ABNORMAL LOW (ref 4.5–11.0)

## 2018-01-17 LAB — CBC W/ AUTO DIFF
BASOPHILS ABSOLUTE COUNT: 0 10*9/L (ref 0.0–0.1)
BASOPHILS ABSOLUTE COUNT: 0 10*9/L (ref 0.0–0.1)
BASOPHILS RELATIVE PERCENT: 0.3 %
BASOPHILS RELATIVE PERCENT: 0.4 %
EOSINOPHILS ABSOLUTE COUNT: 0 10*9/L (ref 0.0–0.4)
EOSINOPHILS ABSOLUTE COUNT: 0 10*9/L (ref 0.0–0.4)
EOSINOPHILS RELATIVE PERCENT: 0.1 %
EOSINOPHILS RELATIVE PERCENT: 0.2 %
HEMATOCRIT: 28.8 % — ABNORMAL LOW (ref 36.0–46.0)
HEMATOCRIT: 30.2 % — ABNORMAL LOW (ref 36.0–46.0)
HEMOGLOBIN: 10.2 g/dL — ABNORMAL LOW (ref 12.0–16.0)
HEMOGLOBIN: 9.7 g/dL — ABNORMAL LOW (ref 12.0–16.0)
LARGE UNSTAINED CELLS: 1 % (ref 0–4)
LYMPHOCYTES ABSOLUTE COUNT: 0.8 10*9/L — ABNORMAL LOW (ref 1.5–5.0)
LYMPHOCYTES ABSOLUTE COUNT: 1 10*9/L — ABNORMAL LOW (ref 1.5–5.0)
LYMPHOCYTES RELATIVE PERCENT: 22.7 %
LYMPHOCYTES RELATIVE PERCENT: 28.6 %
MEAN CORPUSCULAR HEMOGLOBIN CONC: 33.8 g/dL (ref 31.0–37.0)
MEAN CORPUSCULAR HEMOGLOBIN: 31.2 pg (ref 26.0–34.0)
MEAN CORPUSCULAR HEMOGLOBIN: 31.7 pg (ref 26.0–34.0)
MEAN CORPUSCULAR VOLUME: 92 fL (ref 80.0–100.0)
MEAN PLATELET VOLUME: 10.6 fL — ABNORMAL HIGH (ref 7.0–10.0)
MEAN PLATELET VOLUME: 9 fL (ref 7.0–10.0)
MONOCYTES ABSOLUTE COUNT: 0.1 10*9/L — ABNORMAL LOW (ref 0.2–0.8)
MONOCYTES ABSOLUTE COUNT: 0.3 10*9/L (ref 0.2–0.8)
MONOCYTES RELATIVE PERCENT: 3.1 %
MONOCYTES RELATIVE PERCENT: 7.6 %
NEUTROPHILS ABSOLUTE COUNT: 1.9 10*9/L — ABNORMAL LOW (ref 2.0–7.5)
NEUTROPHILS ABSOLUTE COUNT: 2.9 10*9/L (ref 2.0–7.5)
NEUTROPHILS RELATIVE PERCENT: 67 %
PLATELET COUNT: 25 10*9/L — ABNORMAL LOW (ref 150–440)
PLATELET COUNT: 48 10*9/L — ABNORMAL LOW (ref 150–440)
RED BLOOD CELL COUNT: 3.07 10*12/L — ABNORMAL LOW (ref 4.00–5.20)
RED BLOOD CELL COUNT: 3.28 10*12/L — ABNORMAL LOW (ref 4.00–5.20)
RED CELL DISTRIBUTION WIDTH: 18.6 % — ABNORMAL HIGH (ref 12.0–15.0)
WBC ADJUSTED: 2.9 10*9/L — ABNORMAL LOW (ref 4.5–11.0)
WBC ADJUSTED: 4.4 10*9/L — ABNORMAL LOW (ref 4.5–11.0)

## 2018-01-17 LAB — COMPREHENSIVE METABOLIC PANEL
ALBUMIN: 3.2 g/dL — ABNORMAL LOW (ref 3.5–5.0)
ALKALINE PHOSPHATASE: 83 U/L (ref 38–126)
ANION GAP: 8 mmol/L — ABNORMAL LOW (ref 9–15)
BILIRUBIN TOTAL: 1.1 mg/dL (ref 0.0–1.2)
BLOOD UREA NITROGEN: 13 mg/dL (ref 7–21)
BUN / CREAT RATIO: 15
CALCIUM: 8.6 mg/dL (ref 8.5–10.2)
CHLORIDE: 111 mmol/L — ABNORMAL HIGH (ref 98–107)
CO2: 20 mmol/L — ABNORMAL LOW (ref 22.0–30.0)
CREATININE: 0.87 mg/dL (ref 0.60–1.00)
EGFR CKD-EPI AA FEMALE: 73 mL/min/{1.73_m2} (ref >=60)
EGFR CKD-EPI NON-AA FEMALE: 64 mL/min/{1.73_m2} (ref >=60)
GLUCOSE RANDOM: 124 mg/dL (ref 65–179)
POTASSIUM: 3.3 mmol/L — ABNORMAL LOW (ref 3.5–5.0)
PROTEIN TOTAL: 5.4 g/dL — ABNORMAL LOW (ref 6.5–8.3)
SODIUM: 139 mmol/L (ref 135–145)

## 2018-01-17 LAB — MEAN PLATELET VOLUME
Lab: 10.3 — ABNORMAL HIGH
Lab: 10.8 — ABNORMAL HIGH

## 2018-01-17 LAB — HEMATOCRIT: Lab: 27.9 — ABNORMAL LOW

## 2018-01-17 LAB — PROTIME-INR: INR: 1.05

## 2018-01-17 LAB — EOSINOPHILS ABSOLUTE COUNT: Lab: 0

## 2018-01-17 LAB — MAGNESIUM: Magnesium:MCnc:Pt:Ser/Plas:Qn:: 1.8

## 2018-01-17 LAB — PHOSPHORUS: Phosphate:MCnc:Pt:Ser/Plas:Qn:: 3.1

## 2018-01-17 LAB — THYROID STIMULATING HORMONE: Thyrotropin:ACnc:Pt:Ser/Plas:Qn:: 2.13

## 2018-01-17 LAB — APTT: HEPARIN CORRELATION: <0.2

## 2018-01-17 LAB — NEUTROPHIL LEFT SHIFT

## 2018-01-17 LAB — HEPARIN CORRELATION: Lab: <0.2

## 2018-01-17 LAB — INR: Lab: 1.05

## 2018-01-17 LAB — AST (SGOT): Aspartate aminotransferase:CCnc:Pt:Ser/Plas:Qn:: 23

## 2018-01-17 NOTE — Unmapped (Signed)
Patient rounds completed. The following patient needs were addressed:  Personal Belongings, Plan of Care, Call Bell in Reach and Bed Position Low. RN signed consent for platelets and called report to floor .

## 2018-01-17 NOTE — Unmapped (Signed)
Patient rounds completed. The following patient needs were addressed:  Personal Belongings, Plan of Care, Call Bell in Reach and Bed Position Low . Pt resting comfortably- bleeding is completely controlled at head lac site. Pt denies any pain - pt requesting food - RN will order tray. Family at bedside

## 2018-01-17 NOTE — Unmapped (Signed)
OCCUPATIONAL THERAPY  Evaluation (01/17/18 1215)    Patient Name:  Katrina Gould       Medical Record Number: [id number]   Date of Birth: [birth year]  Sex: Female          OT Treatment Diagnosis:  decreased activity tolerance, global weakness     Assessment  Katrina Gould is a 77 y.o. 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 Fall likely secondary to vagal response. Syncope workup in progress.     Pt presents to acute OT with decreased activity tolerance and global weakness impacting participation in ADLs. Orthostatics taken during eval. Pt not orthostatic but c/o dizziness upon standing and was able to quickly recover. Pt was very indep with BADLs/IADLs PTA. Her son/daughter-in-law live about 20 minutes away. Based on the daily activity AM-PAC raw score of 20/24, the pt is considered to be 38.32% impaired with self care. Pt would benefit from 3x post-acute OT at d/c. After review of pt's occupational profile and history, assessment of occupational performance, clinical decision making, and development of POC, pt presents as a moderate??complexity case.       Activity Tolerance During Today's Session  Patient tolerated treatment well    Plan  Planned Frequency of Treatment:  1-2x per day for: 2-3x week       Planned Interventions:  Adaptive equipment;ADL retraining;Balance activities;Bed mobility;Compensatory tech. training;Conservation;Education - Patient;Education - Family / caregiver;Endurance activities;Functional mobility;Functional cognition;Home exercise program;Postular / Proximal stability;Range of motion;Positioning;Safety education;Therapeutic exercise;Transfer training;UE Strength / coordination exercise    Post-Discharge Occupational Therapy Recommendations:  OT Post Acute Discharge Recommendations: 3x weekly    ;    OT DME Recommendations: Defer to post acute    GOALS:   Patient and Family Goals: return home Long Term Goal #1: Pt will score 24/24 on AMPAC in 1 month       Short Term:  Pt will demonstrate 10+ minutes of standing ADL with supervision + LRAD   Time Frame : 2 weeks  Pt will complete full body dressing with mod I   Time Frame : 2 weeks  Pt will complete toilet t/f + toileting with mod I    Time Frame : 2 weeks  Pt will complete hall level mobility to simulate functional mobility between areas of ADL with supervision + LRAD    Time Frame : 2 weeks    Prognosis:  Good  Positive Indicators:  PLOF, motivated   Barriers to Discharge: Decreased caregiver support;Decreased safety awareness;Endurance deficits    Subjective  Current Status pt rec'd/left in bed with call bell and needs in reach   Prior Functional Status Pt reports indep with BADLs and participates in higher level IADLs such as driving, gardening, managing her pool, cooking, and grocery shopping. Pt did not use a mobility device PTA but has a RW available if needed. Pt repots 1 other fall where she tripped over a crate in her garden     Medical Tests / Procedures: see below      Patient / Caregiver reports: This chest thing is scaring me to death    Past Medical History:   Diagnosis Date   ??? Leukemia (CMS-HCC)    ??? Non-Hodgkin lymphoma (CMS-HCC)     Social History     Tobacco Use   ??? Smoking status: Never Smoker   ??? Smokeless tobacco: Never Used   Substance Use Topics   ??? Alcohol use: No  Past Surgical History:   Procedure Laterality Date   ??? IR INSERT PORT AGE GREATER THAN 5 YRS  10/31/2017    IR INSERT PORT AGE GREATER THAN 5 YRS 10/31/2017 Reece Levy, MD IMG VIR H&V [facility]    History reviewed. No pertinent family history.     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 RN notified and aware     Equipment / Environment  Vascular access (PIV, TLC, Port-a-cath, PICC)    Living Situation   Living environment: House   Lives With: Alone   Home Living: One level home;Stairs to enter without rails;Walk-in shower;Built-in shower seat;Handicapped height toilet   Equipment available at home: [employer];Wheelchair-manual;Hospital bed      Cognition   Orientation Level:  Oriented x 4   Arousal/Alertness:  Appropriate responses to stimuli   Attention Span:  Appears intact   Memory:  Appears intact   Following Commands:  Follows all commands and directions without difficulty   Safety Judgment:  Decreased awareness of need for safety   Awareness of Errors:  Assistance required to identify errors made   Problem Solving:  Assistance required to identify errors made   Comments: Upon fall, pt did not immediately contact 911 despite squirting blood coming from head and waking up in a pool of blood. Pt reports applying pressure to the area and calling her daughter-in-law before realizing she should contact 911. Pt attempting to change clothes while waiting for EMS to arrive. Discussed safety with problem solving with pt. Educated on pt's decreased blood/platelet counts 2/2 cancer tx, the severity of squirting blood, importance of contacting 911 before family. Pt verbalized understanding     Hand Function     WFL    Skin Inspection  bruising around L eye, L stitches intact     ROM / Strength/Coordination  UE ROM/ Strength/ Coordination: WFL per observation   LE ROM/ Strength/ Coordination: WFL per observation     Sensation:  responds to light touch     Balance:  static/dynamic sitting SBA; static standing SBA; dynamic standing CGA     Mobility/Gait/Transfers: sup > sit SBA + bedrails, Sit <> stand SBA, functional mobility in room with CGA, toilet t/f SBA + grab bars     ADL:  Bathing: anticipate SBA seated   Grooming: hand hygiene with SBA standing at sink  Dressing: donned/doffed B socks seated EOB using figure 4 technique   Eating: set-up   Toileting: toilet t/f completed with SBA + grab bars. hygiene + clothing management completed with mod I        Vitals/ Orthostatics:  At Rest: HR 87, O2 98%, BP 115/58   With Activity: HR 93 after toilet t/f   Orthostatics: supine 115/58, sitting EOB 125/67, standing 134/63. c/o some dizziness upon standing but resolved in <1 mintue     Interventions Performed During Today's Session: AMPAC: 20/24. Educated pt on role of OT and POC, energy conservation, pursed lip breathing, allowing increased time with  position changes, safety with ADLs/mobility, safety awareness/judgement after fall or during syncope event, low blood/platelet counts causing increased fatigue, and fall prevention. Bed mobility, sit <> stand, LB dressing, toileting, toilet t/f, hand hygiene, standing/seated activity tolerance completed this date         Eval Duration (OT): 34 Min.    Medical Staff Made Aware: RN aware     I attest that I have reviewed the above information.  Signed: Haydee Monica, OTR/L  Filed 01/17/2018

## 2018-01-17 NOTE — Unmapped (Signed)
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 S/p repair, heme consulted for CLL and thrombocytopenia.    CLL  Diagnosed 05/2016 with axillary lymphadenopathy, received rituximab 5-09/2017; has been on obinutuzumab + venetoclax since 10/23/2017. Rai stage 4, IGHV unmutated, no TP53, high risk by CLL-IPI (6 points). Beta 2 microglobulin of 5.05. Cytogenetics: 46,XX,del(4)t(4;4)(p14;q21). Had been tolerating her venetoclax/obinutuzumab therapy quite well until these recent issues with myelosuppression.    Recommendations:  - Would continue to hold venetoclax given her significant myelosuppression. Per outpatient oncology plan, will get recheck next week (9/24) to consider dose reduction.  - Received granix on 9/17, thus no additional G-CSF necessary.  - For her bleeding, it seems to have stopped based on clinical exam, vital signs, stable hgb. Continue with transfusions to maintain at goal plt >50 while in house, and Hgb >7.  - No evidence for additional coagulopathy with normal INR, PT, PTT, thus no need for FFP or cryoprecipitate.  - Agree with plans for syncope workup given her recurrent falls to include telemetry, TTE.    This patient has been staffed with Dr. Vertell Limber. These recommendations were discussed with the primary team.     Please contact the malignant hematology team at [phone] with any further questions.    Donzetta Sprung, MD, MPH   Hematology/Oncology Fellow, PGY4  January 17, 2018 8:46 AM    HISTORY OF PRESENT ILLNESS     Katrina Gould reports that last night had been a restless one, up and down frequently due to issues with sleeplessness. She fell while getting either into bed or out of bed (she cannot recall which) and unfortunately hit her head on the edge of her bedside table. She does not remember actually hitting her head, and is unsure if she had already lost consciousness, but she is certain that she was unconscious shortly thereafter. She denies preceding chest pain, palpitations, dizziness. She denies post-ictal phase, tongue biting. She did lose control of her bladder, but that is a frequent problem for her. She awoke in a pool of blood and dragged herself to the bathroom to get a washcloth and hold pressure. She quickly called her daughter and 911 who transported her rapidly from her home in [place] down to [place]. She was quick thinking enough to grab her discharge paperwork from her recent oncology visit indicating her thrombocytopenia and current chemotherapy regimen. In the ER, she was noted to have spurting blood from her left forehead, thought to potentially be arterial, although it appears she did not require vascular surgery intervention, and hemostasis was obtained with a few vicryl sutures. She initially had a hgb drop of a few points (12.2 on 9/18 afternoon, down to 10.2 in the ER), and platelets were down to 18 from 39. Hematology was called overnight who recommended platelet transfusion with a goal of >50 along with red blood cell product support as needed.    Today, she feels okay, all things considered. She still has some lateral facial pain but it is tolerable. She is focused on getting her hair cleaned and taking a bath. She has some lingering right sided chest pain which has been present since last night. She also  has a bruise on her right buttock in addition to her left face. She reports that she has a history of transient episodes of dizziness - lasting for a few seconds at a time, causing her to stagger. These have been ongoing for months, and are embarrassing. She has never had a fall from those, and no loss of consciousness.    PAST MEDICAL HISTORY      Past Medical History:   Diagnosis Date   ??? Leukemia (CMS-HCC)    ??? Non-Hodgkin lymphoma (CMS-HCC)        PAST SURGICAL HISTORY     Past Surgical History:   Procedure Laterality Date   ??? IR INSERT PORT AGE GREATER THAN 5 YRS  10/31/2017    IR INSERT PORT AGE GREATER THAN 5 YRS 10/31/2017 Reece Levy, MD IMG VIR [facility]       SOCIAL HISTORY     Social History     Tobacco Use   Smoking Status Never Smoker   Smokeless Tobacco Never Used     Social History     Substance and Sexual Activity   Alcohol Use No     Social History     Social History Narrative   ??? Not on file   Lives alone in [place], son and daughter-in-law live nearby  Widowed in 2010   Denies tobacco, alcohol, drugs    FAMILY HISTORY     No family history on file.   Denies family history of scalp laceration.    MEDICATIONS and ALLERGIES     No current facility-administered medications on file prior to encounter.      Current Outpatient Medications on File Prior to Encounter   Medication Sig Dispense Refill   ??? allopurinol (ZYLOPRIM) 300 MG tablet Take 1 tablet (300 mg total) by mouth daily. 30 tablet 2   ??? fesoterodine (TOVIAZ) 4 mg 24 hr tablet Take by mouth Take as directed.      ??? fluticasone propion-salmeterol (ADVAIR) 100-50 mcg/dose diskus Inhale.     ??? LORazepam (ATIVAN) 0.5 MG tablet Take 0.5 mg by mouth two (2) times a day as needed.      ??? mirtazapine (REMERON) 7.5 MG tablet Take 7.5 mg by mouth nightly.     ??? multivitamin (TAB-A-VITE/THERAGRAN) per tablet Take 1 tablet by mouth daily.      ??? oxyCODONE (ROXICODONE) 5 MG immediate release tablet Take 1 tablet (5 mg total) by mouth every eight (8) hours as needed for pain. 20 tablet 0   ??? pantoprazole (PROTONIX) 40 MG tablet Take by mouth daily at 0600.      ??? pravastatin (PRAVACHOL) 80 MG tablet TAKE 1 TABLET BY MOUTH EVERY NIGHT AT BEDTIME     ??? ranitidine (ZANTAC) 150 MG capsule Take 1 capsule (150 mg total) by mouth Two (2) times a day. 180 capsule 3   ??? sertraline (ZOLOFT) 50 MG tablet Take 50 mg by mouth daily.  1   ??? topiramate (TOPAMAX) 50 MG tablet Take 50 mg by mouth daily.  3   ??? venetoclax (VENCLEXTA STARTING PACK) 10 mg-50 mg- 100 mg tablet TAKE BY MOUTH AS DIRECTED ON PACKAGE LABELING 42 each 0   ??? venetoclax (VENCLEXTA) 100 mg tablet TAKE 4 TABLETS BY MOUTH ONCE DAILY WITH A MEAL AND WATER - DO NOT CHEW CRUSH OR BREAK TABLETS 120 each 6   ??? predniSONE (DELTASONE) 10 MG tablet 6 TABS X 1 DAY, 5 TABS X 1 DAY, 4 TABS X 1  DAY, ETC...  0       REVIEW OF SYSTEMS     A complete review of systems was performed and was negative except as noted above in the HPI.    PHYSICAL EXAM:      Vitals:    01/17/18 0452   BP: 100/51   Pulse: 94   Resp: 18   Temp: 37.2 ??C (98.9 ??F)   SpO2: 94%       Gen - well appearing older woman, in no acute distress despite left facial ecchymosis and laceration  Eyes - conjunctivae clear, PERRL  ENT - oral mucosa clear, dentition normal  Lymph - no cervical or supraclavicular lymphadenopathy appreciated  Resp - clear to auscultation bilaterally, no wheezes, crackles or rales  CV - normal rate, regular rhythm, no murmur appreciated, no lower extremity edema noted  GI - abdomen soft, nontender, nondistended, no masses, bowel sounds normal  Skin - left temporal ecchymoses with 2 distinct lacerations approx 3-4cm across, well sutured, c/d/i. Subtle ecchymosis on right buttock, mild tenderness  Neuro - AOx3, EOM intact, no facial droop, speech fluent and coherent, moves all extremities without asymmetry  Psych - affect normal, mood good  MSK - no joint tenderness or swelling      Patient Lines/Drains/Airways Status    Active Peripheral & Central Intravenous Access     Name:   Placement date:   Placement time:   Site:   Days:    Peripheral IV 01/16/18 Right Antecubital   01/16/18    ???    Antecubital   1    Peripheral IV 01/16/18 Left Wrist   01/16/18    1426    Wrist   less than 1    Power Port--a-Cath Single Hub 10/31/17 Right Internal jugular   10/31/17    1147    Internal jugular   77 ECOG PERFORMANCE STATUS: 2 = Ambulatory and capable of all selfcare but unable to carry out any work activities. Up and about more than 50% of waking hours  KARNOFSKY PERFORMANCE STATUS: 64 - Cares for self; unable to carry on normal activity or to do active work.    LABORATORY and RADIOLOGY DATA:      Pertinent Laboratory Data:  Lab Results   Component Value Date    WBC 2.6 (L) 01/17/2018    HGB 9.6 (L) 01/17/2018    HCT 27.9 (L) 01/17/2018    PLT 23 (L) 01/17/2018     Lab Results   Component Value Date    NA 139 01/17/2018    K 3.3 (L) 01/17/2018    CL 111 (H) 01/17/2018    CO2 20.0 (L) 01/17/2018     Lab Results   Component Value Date    ALKPHOS 83 01/17/2018    BILITOT 1.1 01/17/2018    BILIDIR 0.50 (H) 09/28/2017    PROT 5.4 (L) 01/17/2018    ALBUMIN 3.2 (L) 01/17/2018    ALT 15 01/17/2018    AST 23 01/17/2018     Lab Results   Component Value Date    PT 12.1 01/17/2018    PT 11.6 01/16/2018    PT 12.5 08/24/2017    INR 1.05 01/17/2018    INR 1.01 01/16/2018    INR 1.10 08/24/2017       Creatinine   Date Value Ref Range Status   01/17/2018 0.87 0.60 - 1.00 mg/dL Final   16/01/9603 5.40 0.60 - 1.00 mg/dL Final   98/03/9146 8.29 0.60 - 1.00  mg/dL Final     Calcium   Date Value Ref Range Status   01/17/2018 8.6 8.5 - 10.2 mg/dL Final   16/01/9603 9.3 8.5 - 10.2 mg/dL Final   54/12/8117 9.0 8.5 - 10.2 mg/dL Final     Albumin   Date Value Ref Range Status   01/17/2018 3.2 (L) 3.5 - 5.0 g/dL Final   14/78/2956 3.5 3.5 - 5.0 g/dL Final   21/30/8657 3.6 3.5 - 5.0 g/dL Final     LDH   Date Value Ref Range Status   12/10/2017 404 338 - 610 U/L Final   12/04/2017 357 338 - 610 U/L Final   11/27/2017 300 (L) 338 - 610 U/L Final     Beta 2 Microglobulin, Serum   Date Value Ref Range Status   05/23/2016 5.05 (H) 0.87 - 2.34 ug/mL Final     Total IgG   Date Value Ref Range Status   06/05/2017 1,121 600-1,700 mg/dL Final     Total Protein   Date Value Ref Range Status   01/17/2018 5.4 (L) 6.5 - 8.3 g/dL Final   84/69/6295 5.9 (L) 6.5 - 8.3 g/dL Final   28/41/3244 6.0 (L) 6.5 - 8.3 g/dL Final     No results found for: KAPFS, LAMFS, RATKL    Pertinent Imaging:    CT head 9/18  FINDINGS: There are scattered and confluent hypodense foci within the periventricular and deep white matter. These are nonspecific but commonly associated with small vessel ischemic changes. Mild cerebral atrophy. There is no midline shift or mass lesion. ??There is no evidence of intracranial hemorrhage or acute infarct. No fractures are evident. ??The sinuses are pneumatized.       Impression     - No acute intracranial abnormality.     CT C spine 9/18:  FINDINGS:  Bones are osteopenic. Old fracture deformity of C7 spinous process. No acute fracture. No listhesis. There is diffuse cervical and upper thoracic disc space narrowing most pronounced and moderate to severe at C5-C6, C6-C7, and C7-T1. There are discogenic endplate changes of sclerosis and osteophytosis. There is moderate multilevel facet joint narrowing with sclerosis and osseous overgrowth.    Skull base is intact.    No sizable paraspinal hematoma. There are atherosclerotic calcifications of the carotid arteries and visualized aortic arch. Partially visualized right sided Mediport catheter tubing.      Impression     No acute fracture or traumatic malalignment of the cervical spine.    Multilevel cervical spine degenerative disc disease, moderate to severe at C5-C6, C6-C7, and C7-T1, and moderate multilevel facet arthropathy.        CT chest 9/18:  FINDINGS:     AIRWAYS, LUNGS, PLEURA: Clear central tracheobronchial tree. ??No lung consolidation. ??No pleural effusion.    MEDIASTINUM: Large hiatal hernia with near complete herniation of the stomach through the diaphragm. Right chest port with tip in right atrium. Normal heart size. No pericardial effusion. Normal caliber thoracic aorta. ??No mediastinal lymphadenopathy.     IMAGED ABDOMEN: Large hiatal hernia, otherwise unremarkable.    SOFT TISSUES: Unremarkable.    BONES: Acromioclavicular osteoarthrosis bilaterally. Right greater than left glenohumeral osteoarthrosis.        Impression       Large hiatal hernia. Otherwise unremarkable chest CT.    ATTENDING ADDENDUM BY DR. Margit Banda ON 01/17/2018 AT 7:16 AM:    Coronary atherosclerosis.

## 2018-01-17 NOTE — Unmapped (Signed)
Pt's VSS, no signs of bleeding, Platelet count 23. Transfuse for <10. No falls this shift, bed alarms in place, standby assist/supervision while ambulating. Worked with PT/OT today. PVLs done. Placed on tele, no calls. Will be NPO at midnight for [place] of abdomen (must be npo 6 hrs before). Pt reports headache pain throughout shift but refuses pain meds other than tylenol.       Problem: Adult Inpatient Plan of Care  Goal: Plan of Care Review  Outcome: Ongoing - Unchanged  Goal: Patient-Specific Goal (Individualization)  Outcome: Ongoing - Unchanged  Goal: Absence of Hospital-Acquired Illness or Injury  Outcome: Ongoing - Unchanged  Goal: Optimal Comfort and Wellbeing  Outcome: Ongoing - Unchanged  Goal: Readiness for Transition of Care  Outcome: Ongoing - Unchanged  Goal: Rounds/Family Conference  Outcome: Ongoing - Unchanged     Problem: Fall Injury Risk  Goal: Absence of Fall and Fall-Related Injury  Outcome: Ongoing - Unchanged     Problem: Perinatal Fall Injury Risk  Goal: Absence of Fall, Infant Drop and Related Injury  Outcome: Ongoing - Unchanged

## 2018-01-17 NOTE — Unmapped (Signed)
Trauma Tertiary Survey    Assessment/Plan:   77 y.o.??female??with CLL and thrombocytopenia, on venetoclax??,??presenting 01/16/18 as yellow trauma after??fall with left temporal head trauma. Fall likely secondary to vagal response. Patient also with vagal episode in ED (Unresponsive for 30-45sec with normal vital signs) Admit EKG with left bundle branch block.  ??  Known injuries:  - Left temporal laceration/ flap with arterial bleed.??Hemostasis achieved??in trauma bay with 4-0 vicryl.     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 Follow-up with PCP as needed  Atherosclerotic changes of the carotid arteries and aortic arch--> Follow-up with PCP  Coronary Atherosclerosis--> Follow-up with PCP  Mild right hip osteoarthrosis--> Follow-up with PCP    Therapy Recommendations:   PT: 3x weekly outpatient rehab   OT:3 x weekly outpatient rehab   AOD: No needs    Follow Up Needs:  Heme/Onc Follow up pending    History of Present Illness:     Initial History:   Katrina Gould is a 77 y.o. 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 Patient hit head on nightstand yesterday evening and has not been able to control bleeding since. She was due to have chemotherapy yesterday but it was postponed due to platelet count of 28.    On admission left temporal laceration/ flap with arterial bleed. Hemostasis achieved in trauma bay with 4-0 vicryl.    Mechanism of Injury:   The patient's injuries occurred by Ground level fall  Loss of consciousness: no  Initial GCS: 15  The patient was transferred from the scene    Hospital Course:  Patient admitted ZO:XWRUE Care Unit  VTE Prophylaxis:SCD's on since admission  Operative/Interventional procedures:Primary repair of bleeding left scalp laceration  Consulting Services:Heme/Onc-Hold venetoclax for myelosuppression; transfuse platelets for goal >50K if evidence of active bleeding; transfuse for goal >10 K if no evidence of bleeding.     PMHx:  -CLL  -Hiatal Hernia (>10 years)  -Anxiety/Panic Disorder  -Arthritis  -Shingles    PSHx:  -Hysterectomy  -Hernia Repair  -Breast Surgery (Cyst removal)  -Appendectomy  -Right Rotator Cuff Repair    Allergies:  Sertraline  Topiramate  Baclofen    Medications:  Current Facility-Administered Medications   Medication Dose Route Frequency Provider Last Rate Last Dose   ??? acetaminophen (TYLENOL) tablet 650 mg  650 mg Oral Q6H Robbi Garter, MD   650 mg at 01/17/18 1137   ??? docusate sodium (COLACE) capsule 100 mg  100 mg Oral BID Robbi Garter, MD   100 mg at 01/16/18 2126   ??? famotidine (PF) (PEPCID) injection 20 mg  20 mg Intravenous BID Robbi Garter, MD   20 mg at 01/17/18 0958   ??? fluticasone propion-salmeterol (ADVAIR) 100-50 mcg/dose diskus inhaler 1 puff  1 puff Inhalation BID (RT) Robbi Garter, MD       ??? lidocaine-EPINEPHrine (XYLOCAINE W/EPI) 1 %-1:100,000 injection 30 mL  30 mL Infiltration Once Loma Sousa, MD   Stopped at 01/16/18 1513   ??? LORazepam (ATIVAN)  tablet 0.5 mg  0.5 mg Oral BID PRN Robbi Garter, MD       ??? mirtazapine (REMERON) tablet 7.5 mg  7.5 mg Oral Nightly Robbi Garter, MD   7.5 mg at 01/16/18 2126   ??? multivitamins, therapeutic with minerals tablet 1 tablet  1 tablet Oral Daily Robbi Garter, MD   1 tablet at 01/16/18 1838   ??? ondansetron (ZOFRAN-ODT) disintegrating tablet 4 mg  4 mg Oral Q8H PRN Robbi Garter, MD       ??? oxyCODONE (ROXICODONE) immediate release tablet 5 mg  5 mg Oral Q6H PRN Robbi Garter, MD   5 mg at 01/17/18 0136   ??? polyethylene glycol (MIRALAX) packet 17 g  17 g Oral Daily Robbi Garter, MD   17 g at 01/16/18 1837   ??? pravastatin (PRAVACHOL) tablet 80 mg  80 mg Oral Nightly Robbi Garter, MD   80 mg at 01/16/18 2126   ??? sertraline (ZOLOFT) tablet 50 mg  50 mg Oral Daily Robbi Garter, MD   50 mg at 01/16/18 1918   ??? sodium chloride (NS) 0.9 % infusion   Intravenous Continuous Robbi Garter, MD   1,000 mL at 01/16/18 2129   ??? topiramate (TOPAMAX) tablet 50 mg  50 mg Oral Daily Robbi Garter, MD   50 mg at 01/16/18 9147       Social History:  Tobacco use: never a smoker  Alcohol use: denies  Drug use: denies     Objective:     Physical Exam:   Patient Vitals for the past 8 hrs:   BP Temp Temp src Pulse Resp SpO2   01/17/18 1241 115/58 36.9 ??C Oral 78 18 99 %   01/17/18 1136 130/61 ??? ??? 75 ??? ???   01/17/18 0956 106/54 35.9 ??C Oral 75 18 99 %     I/O this shift:  In: 1080 [P.O.:1080]  Out: 775 [Urine:775]  General:                                  No apparent distress, healthy appearing                             Head:                                       Normocephalic, left scalp lacerations x2 (Closed with suture, no signs of active bleeding or infectoin) Left temporal ecchymosis.   Eyes:                                       Left periorbital ecchymosis.   Ears, Nose, Mouth, Throat:   No injuries to nares or ears, no rhinorrhea or otororrhea, mucous membranes are moist, hearing grossly intact  Neck:                                       Trachea is midline, no goiter, Cervical spine non-tender to palpation, no step-offs or deformities  Cardiovascular:  Regular rate and rhythm, soft systolic murmur, femoral and dorsalis pedis pulses 2+ bilaterally, trace lower extremity edema.   Chest:                                      No increased work of breathing, clear to auscultation bilaterally, chest wall is stable. Tender to palpation over xyphoid process. No bruising.   Abdomen:                                Soft, RUQ tenderness, no other tenderness, non-distended, no rebound or guarding  Back:                                        No abrasions or ecchymosis, thoracic and lumbar spines non-tender to palpation, no step-offs or deformities.  Genitourinary:                         Deferred  Musculoskeletal:                     Warm and well perfused, non-tender, no boney deformities, passive and active range of motion without difficulty or instability in all 4 extremities  Neurologic:                               GCS 15, gross motor and sensation are intact  Psychiatric:                               Patient oriented x3, memory intact, behaving appropriately    Radiology Findings:  Xr Chest Portable    Result Date: 01/16/2018  EXAM: XR CHEST PORTABLE DATE: 01/17/1999 10:41 PM ACCESSION: [id number] UN DICTATED: 01/16/2018 2:22 PM [facility]PRETATION LOCATION: [facility]     CLINICAL INDICATION: 77 years old Female with Trauma      COMPARISON: 08/23/2017 and 10/22/2017     TECHNIQUE: Portable Chest Radiograph.     FINDINGS:     Right chest port catheter terminates over the cavoatrial junction.     Decreased lung volumes with crowded lung markings. No focal consolidation.     No pleural effusion or pneumothorax     Apparent widening of superior mediastinum, likely attributable to projection and patient positioning.     Moderate to large hiatal hernia. Chronic left seventh rib deformity. No acute osseous abnormality.                 No acute airspace disease or osseous abnormality.     Apparent widening of superior mediastinum, likely attributable to projection and patient positioning. Recommend repeat radiograph with proper positioning or chest CT if there is high index of clinical suspicion for thoracic trauma PA    Ct Head Wo Contrast    Result Date: 01/16/2018  EXAM: Computed tomography, head or brain without contrast material. DATE: 01/16/2018 2:39 PM ACCESSION: [id number] UN DICTATED: 01/16/2018 3:03 PM INTERPRETATION LOCATION: [facility]     CLINICAL INDICATION: 77 years old Female with Trauma      COMPARISON: None     TECHNIQUE: Axial CT images of the head  from skull base to  vertex without contrast.     FINDINGS: There are scattered and confluent hypodense foci within the periventricular and deep white matter. These are nonspecific but commonly associated with small vessel ischemic changes. Mild cerebral atrophy. There is no midline shift or mass lesion.  There is no evidence of intracranial hemorrhage or acute infarct. No fractures are evident.  The sinuses are pneumatized.         - No acute intracranial abnormality.    Ct Chest Wo Contrast    Result Date: 01/17/2018  EXAM: CT CHEST WO CONTRAST DATE: 01/17/2018 2:48 AM ACCESSION: [id number] UN DICTATED: 01/17/2018 2:48 AM [facility]PRETATION LOCATION: [facility]     CLINICAL INDICATION: 77 years old Female with Chest Pain      COMPARISON: Chest CT 6/24/20191     TECHNIQUE: A spiral CT scan was obtained without IV contrast from the thoracic inlet through the hemidiaphragms. Images were reconstructed in the axial plane.  Coronal and sagittal reformatted images of the chest were also provided for further evaluation of the lung parenchyma.     FINDINGS:     AIRWAYS, LUNGS, PLEURA: Clear central tracheobronchial tree.  No lung consolidation.  No pleural effusion.     MEDIASTINUM: Large hiatal hernia with near complete herniation of the stomach through the diaphragm. Right chest port with tip in right atrium. Normal heart size. No pericardial effusion. Normal caliber thoracic aorta.  No mediastinal lymphadenopathy.     IMAGED ABDOMEN: Large hiatal hernia, otherwise unremarkable.     SOFT TISSUES: Unremarkable.     BONES: Acromioclavicular osteoarthrosis bilaterally. Right greater than left glenohumeral osteoarthrosis.                 Large hiatal hernia. Otherwise unremarkable chest CT.     ATTENDING ADDENDUM BY DR. Margit Banda ON 01/17/2018 AT 7:16 AM:     Coronary atherosclerosis.    Ct Cervical Spine Screening    Result Date: 01/16/2018  EXAM: CT CERVICAL SPINE SCREENING DATE: 01/16/2018 2:39 PM ACCESSION: [id number] UN DICTATED: 01/16/2018 2:47 PM [facility]PRETATION LOCATION: [facility]     CLINICAL INDICATION: 77 years old Female with Trauma      COMPARISON: Correlation with same-day head CT and outside cervical spine MRI dated 08/23/2017     TECHNIQUE: A spiral CT scan of the cervical spine was obtained without administration of IV contrast. Contiguous axial, coronal and sagittal bone algorithm images were reconstructed at 2-mm increments.]     FINDINGS: Bones are osteopenic. Old fracture deformity of C7 spinous process. No acute fracture. No listhesis. There is diffuse cervical and upper thoracic disc space narrowing most pronounced and moderate to severe at C5-C6, C6-C7, and C7-T1. There are discogenic endplate changes of sclerosis and osteophytosis. There is moderate multilevel facet joint narrowing with sclerosis and osseous overgrowth.     Skull base is intact.     No sizable paraspinal hematoma. There are atherosclerotic calcifications of the carotid arteries and visualized aortic arch. Partially visualized right sided Mediport catheter tubing.         No acute fracture or traumatic malalignment of the cervical spine.     Multilevel cervical spine degenerative disc disease, moderate to severe at C5-C6, C6-C7, and C7-T1, and moderate multilevel facet arthropathy.    Xr Trauma Hip Right    Result Date: 01/16/2018  EXAM: XR TRAUMA HIP RIGHT DATE: 01/16/2018 3:26 PM ACCESSION: [id number] UN DICTATED: 01/16/2018 3:32 PM [facility]PRETATION LOCATION: [facility]     CLINICAL INDICATION: 77 years old Female with bruising and ttp over  posterior right hip s/p fall. eval for fracture.      COMPARISON: Pelvis radiographs 01/16/2018; CT abdomen pelvis with contrast 10/29/2017     TECHNIQUE: AP views of the pelvis and right femur and cross table lateral views.     FINDINGS: No acute fracture. Mild femoral acetabular joint space narrowing with osseous overgrowth of the acetabular roof. Knee is approximated. Single AP view of the left hip reveals no acute osseous abnormality, with mild femoral acetabular joint space narrowing and osseous overgrowth of the acetabular roof. Sacroiliac joints and pubic symphysis are approximated. Nonobstructive bowel gas pattern.         Mild right hip osteoarthrosis. No acute osseous abnormality.    Xr Pelvis Ap Portable    Result Date: 01/16/2018  EXAM: XR PELVIS 1 OR 2 VIEWS DATE: 01/16/2018 2:24 PM ACCESSION: [id number] UN DICTATED: 01/16/2018 2:26 PM INTERPRETATION LOCATION: [facility]     CLINICAL INDICATION: 77 years old Female with Trauma      COMPARISON: None.     TECHNIQUE: AP view of the pelvis.     FINDINGS: No acute fracture or dislocation. Hip joint spaces are preserved with small marginal osteophytes bilaterally. Mild degenerative change of the SI joints.         No acute osseous findings.      Pertinent Labs:  All lab results last 24 hours:    Recent Results (from the past 24 hour(s))   Lactic Acid, Venous, Whole Blood    Collection Time: 01/16/18  5:17 PM   Result Value Ref Range    Lactate, Venous 1.4 0.5 - 1.8 mmol/L   CBC w/ Differential    Collection Time: 01/16/18  5:17 PM   Result Value Ref Range    Results Verified by Slide Scan Slide Reviewed     WBC 3.4 (L) 4.5 - 11.0 10*9/L    RBC 3.23 (L) 4.00 - 5.20 10*12/L    HGB 10.4 (L) 12.0 - 16.0 g/dL    HCT 98.1 (L) 19.1 - 46.0 %    MCV 93.5 80.0 - 100.0 fL    MCH 32.3 26.0 - 34.0 pg    MCHC 34.6 31.0 - 37.0 g/dL    RDW 47.8 (H) 29.5 - 15.0 %    MPV 9.9 7.0 - 10.0 fL    Platelet 18 (L) 150 - 440 10*9/L    Variable HGB Concentration Slight (A) Not Present    Neutrophils % 80.6 %    Lymphocytes % 8.9 %    Monocytes % 7.6 %    Eosinophils % 0.0 %    Basophils % 0.4 %    Neutrophil Left Shift 2+ (A) Not Present Absolute Neutrophils 2.7 2.0 - 7.5 10*9/L    Absolute Lymphocytes 0.3 (L) 1.5 - 5.0 10*9/L    Absolute Monocytes 0.3 0.2 - 0.8 10*9/L    Absolute Eosinophils 0.0 0.0 - 0.4 10*9/L    Absolute Basophils 0.0 0.0 - 0.1 10*9/L    Large Unstained Cells 3 0 - 4 %    Macrocytosis Slight (A) Not Present    Anisocytosis Moderate (A) Not Present    Hypochromasia Slight (A) Not Present   Troponin I    Collection Time: 01/16/18  5:17 PM   Result Value Ref Range    Troponin I <0.034 <0.034 ng/mL   ECG 12 Lead    Collection Time: 01/16/18  5:18 PM   Result Value Ref Range    EKG Systolic BP  mmHg  EKG Diastolic BP  mmHg    EKG Ventricular Rate 85 BPM    EKG Atrial Rate 85 BPM    EKG P-R Interval 196 ms    EKG QRS Duration 138 ms    EKG Q-T Interval 422 ms    EKG QTC Calculation 502 ms    EKG Calculated P Axis 53 degrees    EKG Calculated R Axis -23 degrees    EKG Calculated T Axis 132 degrees    QTC Fredericia 474 ms   Urinalysis with Culture Reflex    Collection Time: 01/16/18  6:36 PM   Result Value Ref Range    Color, UA Yellow     Clarity, UA Clear     Specific Gravity, UA 1.013 1.003 - 1.030    pH, UA 7.5 5.0 - 9.0    Leukocyte Esterase, UA Negative Negative    Nitrite, UA Negative Negative    Protein, UA Negative Negative    Glucose, UA Negative Negative    Ketones, UA Negative Negative    Urobilinogen, UA 0.2 mg/dL 0.2 mg/dL, 1.0 mg/dL    Bilirubin, UA Negative Negative    Blood, UA Negative Negative    RBC, UA 3 <=4 /HPF    WBC, UA <1 0 - 5 /HPF    Squam Epithel, UA 1 0 - 5 /HPF    Bacteria, UA None Seen None Seen /HPF    Mucus, UA Rare (A) None Seen /HPF   Toxicology Screen, Urine    Collection Time: 01/16/18  6:36 PM   Result Value Ref Range    Amphetamine Screen, Ur <500 ng/mL Not Applicable    Barbiturate Screen, Ur <200 ng/mL Not Applicable    Benzodiazepine Screen, Urine <200 ng/mL Not Applicable    Cannabinoid Scrn, Ur <20 ng/mL Not Applicable    Methadone Screen, Urine <300 ng/mL Not Applicable Cocaine(Metab.)Screen, Urine <150 ng/mL Not Applicable    Opiate Scrn, Ur <300 ng/mL Not Applicable   Prepare Platelet Pheresis    Collection Time: 01/16/18 10:05 PM   Result Value Ref Range    Unit Blood Type A Pos     ISBT Number 6200     Unit # [id number]     Status Issued     Product ID Platelets     PRODUCT CODE [id number]    CBC w/ Differential    Collection Time: 01/16/18 11:48 PM   Result Value Ref Range    WBC 4.4 (L) 4.5 - 11.0 10*9/L    RBC 3.28 (L) 4.00 - 5.20 10*12/L    HGB 10.2 (L) 12.0 - 16.0 g/dL    HCT 78.2 (L) 95.6 - 46.0 %    MCV 92.0 80.0 - 100.0 fL    MCH 31.2 26.0 - 34.0 pg    MCHC 33.9 31.0 - 37.0 g/dL    RDW 21.3 (H) 08.6 - 15.0 %    MPV 9.0 7.0 - 10.0 fL    Platelet 48 (L) 150 - 440 10*9/L    Variable HGB Concentration Slight (A) Not Present    Neutrophils % 67.0 %    Lymphocytes % 22.7 %    Monocytes % 7.6 %    Eosinophils % 0.1 %    Basophils % 0.4 %    Neutrophil Left Shift 3+ (A) Not Present    Absolute Neutrophils 2.9 2.0 - 7.5 10*9/L    Absolute Lymphocytes 1.0 (L) 1.5 - 5.0 10*9/L    Absolute Monocytes 0.3 0.2 - 0.8 10*9/L  Absolute Eosinophils 0.0 0.0 - 0.4 10*9/L    Absolute Basophils 0.0 0.0 - 0.1 10*9/L    Large Unstained Cells 2 0 - 4 %    Macrocytosis Slight (A) Not Present    Anisocytosis Moderate (A) Not Present   Prepare Platelet Pheresis    Collection Time: 01/17/18  1:16 AM   Result Value Ref Range    Unit Blood Type B Pos     ISBT Number 7300     Unit # [id number]     Status Issued     Product ID Platelets     PRODUCT CODE [id number]    ECG 12 Lead    Collection Time: 01/17/18  1:57 AM   Result Value Ref Range    EKG Systolic BP  mmHg    EKG Diastolic BP  mmHg    EKG Ventricular Rate 82 BPM    EKG Atrial Rate 82 BPM    EKG P-R Interval 204 ms    EKG QRS Duration 138 ms    EKG Q-T Interval 414 ms    EKG QTC Calculation 483 ms    EKG Calculated P Axis 50 degrees    EKG Calculated R Axis -32 degrees    EKG Calculated T Axis 118 degrees    QTC Fredericia 459 ms   CBC w/ Differential    Collection Time: 01/17/18  2:06 AM   Result Value Ref Range    WBC 2.9 (L) 4.5 - 11.0 10*9/L    RBC 3.07 (L) 4.00 - 5.20 10*12/L    HGB 9.7 (L) 12.0 - 16.0 g/dL    HCT 11.9 (L) 14.7 - 46.0 %    MCV 93.9 80.0 - 100.0 fL    MCH 31.7 26.0 - 34.0 pg    MCHC 33.8 31.0 - 37.0 g/dL    RDW 82.9 (H) 56.2 - 15.0 %    MPV 10.6 (H) 7.0 - 10.0 fL    Platelet 25 (L) 150 - 440 10*9/L    Variable HGB Concentration Slight (A) Not Present    Neutrophils % 66.6 %    Lymphocytes % 28.6 %    Monocytes % 3.1 %    Eosinophils % 0.2 %    Basophils % 0.3 %    Neutrophil Left Shift 3+ (A) Not Present    Absolute Neutrophils 1.9 (L) 2.0 - 7.5 10*9/L    Absolute Lymphocytes 0.8 (L) 1.5 - 5.0 10*9/L    Absolute Monocytes 0.1 (L) 0.2 - 0.8 10*9/L    Absolute Eosinophils 0.0 0.0 - 0.4 10*9/L    Absolute Basophils 0.0 0.0 - 0.1 10*9/L    Large Unstained Cells 1 0 - 4 %    Macrocytosis Moderate (A) Not Present    Anisocytosis Moderate (A) Not Present    Hypochromasia Slight (A) Not Present   PT-INR    Collection Time: 01/17/18  3:59 AM   Result Value Ref Range    PT 12.1 10.2 - 13.1 sec    INR 1.05    APTT    Collection Time: 01/17/18  3:59 AM   Result Value Ref Range    APTT 28.6 25.9 - 39.5 sec    Heparin Correlation <0.2    Comprehensive metabolic panel    Collection Time: 01/17/18  3:59 AM   Result Value Ref Range    Sodium 139 135 - 145 mmol/L    Potassium 3.3 (L) 3.5 - 5.0 mmol/L    Chloride 111 (H) 98 - 107  mmol/L    CO2 20.0 (L) 22.0 - 30.0 mmol/L    BUN 13 7 - 21 mg/dL    Creatinine 7.82 9.56 - 1.00 mg/dL    BUN/Creatinine Ratio 15     EGFR CKD-EPI Non-African American, Female 64 >=60 mL/min/1.15m2    EGFR CKD-EPI African American, Female 78 >=60 mL/min/1.29m2    Glucose 124 65 - 179 mg/dL    Calcium 8.6 8.5 - 21.3 mg/dL    Albumin 3.2 (L) 3.5 - 5.0 g/dL    Total Protein 5.4 (L) 6.5 - 8.3 g/dL    Total Bilirubin 1.1 0.0 - 1.2 mg/dL    AST 23 14 - 38 U/L    ALT 15 15 - 48 U/L    Alkaline Phosphatase 83 38 - 126 U/L    Anion Gap 8 (L) 9 - 15 mmol/L   Magnesium Level    Collection Time: 01/17/18  3:59 AM   Result Value Ref Range    Magnesium 1.8 1.6 - 2.2 mg/dL   Phosphorus Level    Collection Time: 01/17/18  3:59 AM   Result Value Ref Range    Phosphorus 3.1 2.9 - 4.7 mg/dL   CBC    Collection Time: 01/17/18  3:59 AM   Result Value Ref Range    WBC 2.6 (L) 4.5 - 11.0 10*9/L    RBC 3.00 (L) 4.00 - 5.20 10*12/L    HGB 9.6 (L) 12.0 - 16.0 g/dL    HCT 08.6 (L) 57.8 - 46.0 %    MCV 93.0 80.0 - 100.0 fL    MCH 31.9 26.0 - 34.0 pg    MCHC 34.3 31.0 - 37.0 g/dL    RDW 46.9 (H) 62.9 - 15.0 %    MPV 9.6 7.0 - 10.0 fL    Platelet 23 (L) 150 - 440 10*9/L   TSH    Collection Time: 01/17/18  3:59 AM   Result Value Ref Range    TSH 2.130 0.600 - 3.300 uIU/mL   CBC    Collection Time: 01/17/18  1:01 PM   Result Value Ref Range    WBC 2.5 (L) 4.5 - 11.0 10*9/L    RBC 2.89 (L) 4.00 - 5.20 10*12/L    HGB 9.2 (L) 12.0 - 16.0 g/dL    HCT 52.8 (L) 41.3 - 46.0 %    MCV 93.7 80.0 - 100.0 fL    MCH 31.8 26.0 - 34.0 pg    MCHC 33.9 31.0 - 37.0 g/dL    RDW 24.4 (H) 01.0 - 15.0 %    MPV 10.8 (H) 7.0 - 10.0 fL    Platelet 23 (L) 150 - 440 10*9/L

## 2018-01-17 NOTE — Unmapped (Signed)
MD done with laceration repair. EKG tech called for EKG.

## 2018-01-17 NOTE — Unmapped (Signed)
Report given to [facility]. Patient care transferred at this time.

## 2018-01-17 NOTE — Unmapped (Addendum)
PHYSICAL THERAPY  Evaluation (01/17/18 1052)     Patient Name:  Katrina Gould       Medical Record Number: [id number]   Date of Birth: 1941/01/15  Sex: Female            Treatment Diagnosis: Headache, pain with mobility, Decreased stability.     ASSESSMENT    [name] is a 77 y.o. 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 Fall likely secondary to vagal response. Syncope workup in progress. Today presented with good balance and stability with all mobility durng PT session. No episodes of syncope during session. Based on the AM-PAC 5 item raw score of 20/20, the patient is considered to be 0% impaired with basic mobility. This indicates no additional PT services required while in hospital, though may benefit from HHPT.     After review of contributing co-morbidities and personal factors, evolving/changing clinical presentation and exam findings, patient demonstrates moderate complexity for evaluation and development of plan of care.      Today's Interventions: AMPAC 20/20; bed mobillty, transfers, gait, standing balance, seated balance. Education for role of PT, PT recommendations, POC. Recommendations for pacing and use for device as needed.     Activity Tolerance: Patient tolerated treatment well    PLAN  Planned Frequency of Treatment:  D/C Services for: D/C Services      Planned Interventions: Education - Patient;Balance activities;Education - Family / caregiver;Endurance activities;Functional cognition;Functional mobility;Gait training;Home exercise program;Modalities;Neuromuscular re-education;Postural re-education;Therapeutic exercise;Therapeutic activity    Post-Discharge Physical Therapy Recommendations:     3x weekly  PT DME Recommendations: None     Goals:   Patient and Family Goals: Return home    Prognosis:  Good     Positive Indicators: Todays performance, PLOF    SUBJECTIVE  Patient reports: Has been getting up to go to the bathroom   Current Functional Status: njuries: L temporal laceration/flap with arterial bleed. Incidental: Mild right hip osteoarthrosis. Mild degenerative change of the SI joints.  Moderate to severe DDD at C5-C6, C6-C7, and C7-T1, and moderate multilevel facet arthropathy. Large hiatal hernia.     Prior functional status: Previously indepdennt with alll mobility, drives, cleans, shops  Equipment available at home: [employer];Wheelchair-manual    Past Medical History:   Diagnosis Date   ??? Leukemia (CMS-HCC)    ??? Non-Hodgkin lymphoma (CMS-HCC)     Social History     Tobacco Use   ??? Smoking status: Never Smoker   ??? Smokeless tobacco: Never Used   Substance Use Topics   ??? Alcohol use: No      Past Surgical History:   Procedure Laterality Date   ??? IR INSERT PORT AGE GREATER THAN 5 YRS  10/31/2017    IR INSERT PORT AGE GREATER THAN 5 YRS 10/31/2017 Reece Levy, MD IMG VIR [facility]    History reviewed. No pertinent family history.     Allergies: Sertraline; Topiramate; and Baclofen                Objective Findings              Precautions: Bleeding precautions                                  Communication Preference: Verbal  Pain Comments: Reports 9/10 headache pain.   Medical Tests / Procedures: Platelets 23;  ANC 1.9; HGB 9.6  Equipment / Environment: Vascular access (PIV, TLC, Port-a-cath, PICC)    At Rest: VSS  With Activity: BP 130/61, HR 75 post walk  Orthostatics: denies dizziness in standign       Living environment: House  Lives With: Alone  Home Living: One level home;Level entry             Cognition: A&Ox4             UE Strength: WLF     LE Strength: WFL                          Balance: Good standing balance without assistive device.  Able to stand at EOB and lean over bed to clean off dried pllod and other thinsgs in her bed.  Able to maintain balance agains perturbations         Bed Mobility: independent  Transfers: sit<>stand independantly   Gait: Ambulated 262ft with RW and supervision.  Good balance and stability.  No instability and minimal need for device, though utilized due to headache and past vasovagal episodes.    Stairs: Deferred           Eval Duration(PT): 20 Min.    Medical Staff Made Aware: Received in and returned to bed, RN aware of position/performance.      I attest that I have reviewed the above information.  Signed: Marta Lamas, PT  Filed 01/17/2018

## 2018-01-17 NOTE — Unmapped (Signed)
Service Date: 01/17/2018  Admit Date: 01/16/2018, Hospital Day: 2  Hospital Service: [facility]uma [facility])  Attending: Suella Broad, MD    Assessment   77 y.o. 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 Fall likely secondary to vagal response. Admit EKG with left bundle branch block. Completing syncope work-up.   ??  Known injuries:  - Left temporal laceration/ flap with arterial bleed. Hemostasis achieved in trauma bay with 4-0 vicryl. Patient with vagal response and became unresponsive during repair, strong femoral pulse with rate of approximately 60 bpm throughout episode. Patient awoke to painful stimuli after 30-45 sec.    Interval Events/subjective   No acute events overnight.  Still experiencing left scalp tenderness and right chest wall tenderness.  Pain overall well controlled.  No episodes of vasovagal activity overnight.    Plan   Neuro: pain well-controlled.  History of anxiety.  -Scheduled Tylenol  -Home Topamax, Remeron, sertraline  -Home Ativan as needed  -PRN oxycodone     CV: HDS.  Concern for vasovagal syncope  -Syncope work-up: TSH, carotid duplex, Echo pending  -9/18 EKG: Left bundle branch block, no ST elevation  -Home pravastatin    Pulm: stable on room air  -Home Advair    F: Medlocked.    E: No active electrolyte abnormalities    N:Nutrition Therapy General (Regular).    Gi:   - Scheduled Colace, MiraLAX  - Zofran PRN    GU: Voiding spontaneously.     Heme/ID: History of CLL with myelosuppression, on venetoclax .  -Heme-onc following, appreciate recommendations  -Holding venetoclax per heme-onc recommendation  -Thrombocytopenia: Transfuse for goal greater than 10K if no signs of active bleeding.  If concern for active bleeding, transfuse for goal greater than 50 K.  -Leukopenia: Continue to monitor CBC    Endo: No active issues    Prophylaxis:  - DVT: Holding at this time secondary to scalp bleed. SCDs. - GI: Pepcid    Disposition: Floor status.   -PT/OT recs: Pending  -Home health needs:      Objective     Vitals:   Temp:  [35.8 ??C-37.2 ??C] 37.2 ??C  Heart Rate:  [44-110] 94  SpO2 Pulse:  [84-89] 84  Resp:  [12-23] 18  BP: (75-163)/(51-111) 100/51  MAP (mmHg):  [69-108] 71  SpO2:  [91 %-100 %] 94 %  BMI (Calculated):  [26.62] 26.62    Physical Exam:  -General:  Appropriate, significant ecchymoses on left face.  Left scalp laceration x2 closed primarily, intact.  -Neurological: Moves all 4 extremities spontaneously.   -Cardiovascular: HDS. Tenderness to palpation along right chest wall.  -Pulmonary: Normal work of breathing.   -Abdomen: Soft, non-tender, non-distended. No rebound or guarding.   -Extremities: Warm, well perfused.     Recent Imaging:  Xr Chest Portable    Result Date: 01/16/2018  EXAM: XR CHEST PORTABLE DATE: 01/17/1999 10:41 PM ACCESSION: [id number] UN DICTATED: 01/16/2018 2:22 PM INTERPRETATION LOCATION: [facility]     CLINICAL INDICATION: 77 years old Female with Trauma      COMPARISON: 08/23/2017 and 10/22/2017     TECHNIQUE: Portable Chest Radiograph.     FINDINGS:     Right chest port catheter terminates over the cavoatrial junction.     Decreased lung volumes with crowded lung markings. No focal consolidation.     No pleural effusion or pneumothorax     Apparent widening of superior mediastinum, likely attributable to  projection and patient positioning.     Moderate to large hiatal hernia. Chronic left seventh rib deformity. No acute osseous abnormality.                 No acute airspace disease or osseous abnormality.     Apparent widening of superior mediastinum, likely attributable to projection and patient positioning. Recommend repeat radiograph with proper positioning or chest CT if there is high index of clinical suspicion for thoracic trauma PA    Ct Head Wo Contrast    Result Date: 01/16/2018  EXAM: Computed tomography, head or brain without contrast material. DATE: 01/16/2018 2:39 PM ACCESSION: [id number] UN DICTATED: 01/16/2018 3:03 PM INTERPRETATION LOCATION: [facility]     CLINICAL INDICATION: 77 years old Female with Trauma      COMPARISON: None     TECHNIQUE: Axial CT images of the head  from skull base to vertex without contrast.     FINDINGS: There are scattered and confluent hypodense foci within the periventricular and deep white matter. These are nonspecific but commonly associated with small vessel ischemic changes. Mild cerebral atrophy. There is no midline shift or mass lesion.  There is no evidence of intracranial hemorrhage or acute infarct. No fractures are evident.  The sinuses are pneumatized.         - No acute intracranial abnormality.    Ct Chest Wo Contrast    Result Date: 01/17/2018  EXAM: CT CHEST WO CONTRAST DATE: 01/17/2018 2:48 AM ACCESSION: [id number] UN DICTATED: 01/17/2018 2:48 AM INTERPRETATION LOCATION: [facility]     CLINICAL INDICATION: 77 years old Female with Chest Pain      COMPARISON: Chest CT 6/24/20191     TECHNIQUE: A spiral CT scan was obtained without IV contrast from the thoracic inlet through the hemidiaphragms. Images were reconstructed in the axial plane.  Coronal and sagittal reformatted images of the chest were also provided for further evaluation of the lung parenchyma.     FINDINGS:     AIRWAYS, LUNGS, PLEURA: Clear central tracheobronchial tree.  No lung consolidation.  No pleural effusion.     MEDIASTINUM: Large hiatal hernia with near complete herniation of the stomach through the diaphragm. Right chest port with tip in right atrium. Normal heart size. No pericardial effusion. Normal caliber thoracic aorta.  No mediastinal lymphadenopathy.     IMAGED ABDOMEN: Large hiatal hernia, otherwise unremarkable.     SOFT TISSUES: Unremarkable.     BONES: Acromioclavicular osteoarthrosis bilaterally. Right greater than left glenohumeral osteoarthrosis.                 Large hiatal hernia. Otherwise unremarkable chest CT.     ATTENDING ADDENDUM BY DR. Margit Banda ON 01/17/2018 AT 7:16 AM:     Coronary atherosclerosis.    Ct Cervical Spine Screening    Result Date: 01/16/2018  EXAM: CT CERVICAL SPINE SCREENING DATE: 01/16/2018 2:39 PM ACCESSION: [id number] UN DICTATED: 01/16/2018 2:47 PM INTERPRETATION LOCATION: [facility]     CLINICAL INDICATION: 77 years old Female with Trauma      COMPARISON: Correlation with same-day head CT and outside cervical spine MRI dated 08/23/2017     TECHNIQUE: A spiral CT scan of the cervical spine was obtained without administration of IV contrast. Contiguous axial, coronal and sagittal bone algorithm images were reconstructed at 2-mm increments.]     FINDINGS: Bones are osteopenic. Old fracture deformity of C7 spinous process. No acute fracture. No listhesis. There is diffuse cervical and upper thoracic disc space narrowing most pronounced  and moderate to severe at C5-C6, C6-C7, and C7-T1. There are discogenic endplate changes of sclerosis and osteophytosis. There is moderate multilevel facet joint narrowing with sclerosis and osseous overgrowth.     Skull base is intact.     No sizable paraspinal hematoma. There are atherosclerotic calcifications of the carotid arteries and visualized aortic arch. Partially visualized right sided Mediport catheter tubing.         No acute fracture or traumatic malalignment of the cervical spine.     Multilevel cervical spine degenerative disc disease, moderate to severe at C5-C6, C6-C7, and C7-T1, and moderate multilevel facet arthropathy.    Xr Trauma Hip Right    Result Date: 01/16/2018  EXAM: XR TRAUMA HIP RIGHT DATE: 01/16/2018 3:26 PM ACCESSION: [id number] UN DICTATED: 01/16/2018 3:32 PM INTERPRETATION LOCATION: [facility]     CLINICAL INDICATION: 77 years old Female with bruising and ttp over posterior right hip s/p fall. eval for fracture.      COMPARISON: Pelvis radiographs 01/16/2018; CT abdomen pelvis with contrast 10/29/2017     TECHNIQUE: AP views of the pelvis and right femur and cross table lateral views.     FINDINGS: No acute fracture. Mild femoral acetabular joint space narrowing with osseous overgrowth of the acetabular roof. Knee is approximated. Single AP view of the left hip reveals no acute osseous abnormality, with mild femoral acetabular joint space narrowing and osseous overgrowth of the acetabular roof. Sacroiliac joints and pubic symphysis are approximated. Nonobstructive bowel gas pattern.         Mild right hip osteoarthrosis. No acute osseous abnormality.    Xr Pelvis Ap Portable    Result Date: 01/16/2018  EXAM: XR PELVIS 1 OR 2 VIEWS DATE: 01/16/2018 2:24 PM ACCESSION: [id number] UN DICTATED: 01/16/2018 2:26 PM INTERPRETATION LOCATION: [facility]     CLINICAL INDICATION: 77 years old Female with Trauma      COMPARISON: None.     TECHNIQUE: AP view of the pelvis.     FINDINGS: No acute fracture or dislocation. Hip joint spaces are preserved with small marginal osteophytes bilaterally. Mild degenerative change of the SI joints.         No acute osseous findings.

## 2018-01-17 NOTE — Unmapped (Signed)
Patient rounds completed. The following patient needs were addressed:  Pain, Toileting, Personal Belongings, Plan of Care, Call Bell in Reach and Bed Position Low .

## 2018-01-17 NOTE — Unmapped (Addendum)
77 yo F Hx of CLL (On venetoclax). Presented 01/16/18 as yellow trauma for vagal episode with subsequent fall where she struck her head on a night stand. Superficial arterial head bleed controlled with local repair in ED on arrival. CT head without intracranial bleed. Hgb down-trended to 10.4 from 12.2 on admission. Platelets down-trended to 18 from 39. Platelets stabilized at 59K following administration of a total of 3 units of platelets.  Hemoglobin and vital signs stabilized without intervention. .      Due to her possible syncopal episode, a syncope workup was obtained which showed:  - EKG: Normal sinus rhythm with left axis deviation and left bundle branch block  - Carotid duplex:59% stenosis R ICA, 30% stenosis L ICA.  Repeat carotid duplex in 1 year with PCP  - Echocardiography: Normal EF > 55%, grade 1 diastolic dysfunction, aortic sclerosis, moderate dilation of the left atrium, mild mitral/tricuspid regurgitation, normal right ventricular systolic function, borderline elevated pulmonary artery systolic pressure.  Follow-up with PCP  - TSH: Within normal limits    Ultimately, no anatomical or pathological process was identified as the underlying cause of her syncopal event.    Hematology/oncology was consulted on admission and recommended the following:  - Hold venetoclax temporarily in light of cytopenias / thrombocytopenia  - Maintain at goal plt >50 while in house, and Hgb >7.      Overall, the patient did well following arrival on the floor.  Her diet was slowly advanced and at the time of discharge she was tolerating a regular diet.  A tertiary trauma survey was performed on Hospital Day 1, which revealed nonspecific right upper quadrant pain.  An abdominal ultrasound was ordered revealing hepatomegaly, with likely hepatic steatosis.  Patient was informed of these findings and instructed to follow-up with her PCP for further management. The patient was able to void spontaneously, have her pain controlled with P.O. pain medication, and return to former ambulatory status.  Patient was evaluated by Physical Therapy and Occupational Therapy; all consultants found patient to be fit for discharge. She will be discharged home on 01/19/2018. She was instructed to follow-up in clinic in 2-3 weeks.

## 2018-01-17 NOTE — Unmapped (Signed)
Patient removed C-collar. MD aware.

## 2018-01-17 NOTE — Unmapped (Signed)
2 units of platelets transfused, platelet count currently 25. Pt c/o chest pain, EKG and chest x ray completed. Left head wound open to air and intact with old drainage. Pt ambulates 1 person assist to bathroom. Tolerating regular diet. Safety measures in place. Will continue to monitor.   Problem: Adult Inpatient Plan of Care  Goal: Plan of Care Review  Outcome: Progressing  Goal: Patient-Specific Goal (Individualization)  Outcome: Progressing  Goal: Absence of Hospital-Acquired Illness or Injury  Outcome: Progressing  Goal: Optimal Comfort and Wellbeing  Outcome: Progressing  Goal: Readiness for Transition of Care  Outcome: Progressing  Goal: Rounds/Family Conference  Outcome: Progressing     Problem: Fall Injury Risk  Goal: Absence of Fall and Fall-Related Injury  Outcome: Progressing     Problem: Perinatal Fall Injury Risk  Goal: Absence of Fall, Infant Drop and Related Injury  Outcome: Progressing

## 2018-01-17 NOTE — Unmapped (Signed)
Care Management  Initial Transition Planning Assessment  Admitted after fall at home with scalp lac. Hx CLL and on chemotherapy.             General  Care Manager assessed the patient by : In person interview with patient, Medical record review, Discussion with Clinical Care team  Orientation Level: Oriented X4  Who provides care at home?: N/A(Self care at baseline. Son and DIL assist as needed. )    Contact/Decision Maker        Extended Emergency Contact Information  Primary Emergency Contact: Donnal Debar [place]  Home Phone: [phone]  Relation: Son  Secondary Emergency Contact: Wendall Mola [place]  Home Phone: [phone]  Relation: Other    Legal Next of Kin / Guardian / POA / Advance Directives       Advance Directive (Medical Treatment)  Does patient have an advance directive covering medical treatment?: Patient does not have advance directive covering medical treatment.  Information provided on advance directive:: No  Patient requests assistance:: No         Patient Information  Lives with: Alone Drives herself. Son and DIL assist as needed     Type of Residence: Private residence             Support Systems: Children    Responsibilities/Dependents at home?: No    Home Care services in place prior to admission?: No                  Equipment Currently Used at Home: none       Currently receiving outpatient dialysis?: No       Financial Information       Need for financial assistance?: No     Type of Residence: Mailing Address:  [address]  [place] [address]  Contacts:    Patient Phone Number: cell [phone]        Medical Provider(s): Lauro Regulus confirmed  Reason for Admission: Admitting Diagnosis:  Laceration of scalp, initial encounter [S01.01XA]  Past Medical History:   has a past medical history of Leukemia (CMS-HCC) and Non-Hodgkin lymphoma (CMS-HCC).  Past Surgical History:   has a past surgical history that includes IR Insert Port Age Greater Than 5 Years (10/31/2017).   Previous admit date: N/A    Primary Insurance- Payor: AETNA MEDICARE ADV / Plan: AETNA MEDICARE ADV / Product Type: *No Product type* /   Secondary Insurance ??? None  Prescription Coverage ??? [employer]  Preferred Pharmacy - [facility] [phone] - [place],  - [address]  [facility]  [facility]    Transportation home: Private vehicle daughter in law  Level of function prior to admission: Independent        Social Determinants of Health  Social Determinants of Health were addressed in provider documentation.  Please refer to patient history.    Discharge Needs Assessment  Concerns to be Addressed: no discharge needs identified  Patient with PT OT recs 3x. Patient declines and does not want HH services. States she is very active at home and does not feel she will benefit or need ongoing therapy.     Clinical Risk Factors: > 65, Principal Diagnosis: Cancer, Stroke, COPD, Heart Failure, AMI, Pneumonia, Joint Replacment    Barriers to taking medications: No    Prior overnight hospital stay or ED visit in last 90 days: No    Readmission Within  the Last 30 Days: no previous admission in last 30 days         Anticipated Changes Related to Illness: none    Equipment Needed After Discharge: none    Discharge Facility/Level of Care Needs:      Readmission  Risk of Unplanned Readmission Score:  %  Readmitted Within the Last 30 Days?   Patient at risk for readmission?: No    Discharge Plan  Screen findings are: Care Manager reviewed the plan of the patient's care with the Multidisciplinary Team. No discharge planning needs identified at this time. Care Manager will continue to manage plan and monitor patient's progress with the team.    Expected Discharge Date: 01/19/18      Patient and/or family were provided with choice of facilities / services that are available and appropriate to meet post hospital care needs?: No       Initial Assessment complete?: Yes

## 2018-01-17 NOTE — Unmapped (Addendum)
Brief hematology / oncology recommendation note:    Katrina Gould is a 77 y.o. 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    Per trauma team, patient fell from bed and struck her head on night stand.  She had an arterial bleed in the ED on arrival, which was controlled with local repair.  CT head without intracranial bleed.  Labs notable for hemoglobin of 12.2 down-trending to 10.4 in setting of active bleeding, platelets 39 down-trending to 18.    Trauma surgery plans to admit to their service for monitoring in light of arterial bleed in patient with thrombocytopenia.  They are reaching out regarding guidance for transfusion thresholds and continuation of home medications.    Patient takes venetoclax for CLL.  Note from clinic yesterday is unsigned, though it references a plan to hold venetoclax for cytopenias until platelets > 50 and ANC > 1.0.    Recommendations:  - Hold venetoclax for now in light of cytopenias / thrombocytopenia  - Agree with transfusion to platelet goal > 50 in light of bleeding.  Reasonable to transfuse for hemoglobin goal > 7 or higher if concern for sympotmatic anemia, with frequency of checks as per trauma team's clinical concern  - Formal consultation to follow in AM    8:30 PM 01/16/2018   Laurian Brim, MD  Hematology and Oncology Fellow     Please page Med E Fellow at [phone] if questions about oncologic care occur.

## 2018-01-18 DIAGNOSIS — S0101XA Laceration without foreign body of scalp, initial encounter: Principal | ICD-10-CM

## 2018-01-18 LAB — BASIC METABOLIC PANEL
ANION GAP: 4 mmol/L — ABNORMAL LOW (ref 9–15)
BLOOD UREA NITROGEN: 12 mg/dL (ref 7–21)
BUN / CREAT RATIO: 15
CALCIUM: 8.5 mg/dL (ref 8.5–10.2)
CHLORIDE: 111 mmol/L — ABNORMAL HIGH (ref 98–107)
CO2: 24 mmol/L (ref 22.0–30.0)
CREATININE: 0.78 mg/dL (ref 0.60–1.00)
EGFR CKD-EPI AA FEMALE: 84 mL/min/{1.73_m2} (ref >=60)
EGFR CKD-EPI NON-AA FEMALE: 73 mL/min/{1.73_m2} (ref >=60)
GLUCOSE RANDOM: 107 mg/dL (ref 65–179)

## 2018-01-18 LAB — EGFR CKD-EPI AA FEMALE: Lab: 84

## 2018-01-18 LAB — CBC
HEMATOCRIT: 25.2 % — ABNORMAL LOW (ref 36.0–46.0)
HEMOGLOBIN: 8.5 g/dL — ABNORMAL LOW (ref 12.0–16.0)
MEAN CORPUSCULAR HEMOGLOBIN: 31.5 pg (ref 26.0–34.0)
MEAN CORPUSCULAR VOLUME: 93.5 fL (ref 80.0–100.0)
MEAN PLATELET VOLUME: 9.3 fL (ref 7.0–10.0)
PLATELET COUNT: 59 10*9/L — ABNORMAL LOW (ref 150–440)
RED BLOOD CELL COUNT: 2.69 10*12/L — ABNORMAL LOW (ref 4.00–5.20)
RED CELL DISTRIBUTION WIDTH: 18.6 % — ABNORMAL HIGH (ref 12.0–15.0)
WBC ADJUSTED: 1.8 10*9/L — ABNORMAL LOW (ref 4.5–11.0)

## 2018-01-18 LAB — PLATELET COUNT: Lab: 59 — ABNORMAL LOW

## 2018-01-18 LAB — MAGNESIUM: Magnesium:MCnc:Pt:Ser/Plas:Qn:: 2

## 2018-01-18 LAB — PHOSPHORUS: Phosphate:MCnc:Pt:Ser/Plas:Qn:: 4

## 2018-01-18 NOTE — Unmapped (Signed)
No acute events overnight. Pt free from falls or injury this shift. Bed alarm engaged for safety. No calls from tele. 2 units of platelets ordered, one administered prior to end of my shift (1700-2300), 2nd unit to be given by following nurse. PRN zofran given one time for nausea, with effectiveness. Pain controlled with scheduled tylenol. SCDs in place, VTE prevention education provided. VSS. Will continue to monitor.

## 2018-01-18 NOTE — Unmapped (Signed)
Received report from night shift RN at bedside. No acute events overnight noted. VSS. Afebrile. No calls from tele so far this shift. Alert, oriented x4. Tolerating regular diet. Pt denies need for prn pain meds. Fall reduction protocol maintained, pt remains free from falls and injury throughout shift. Adequate urine output. No s/s new skin breakdown observed. Pt currently resting in bed without complaints, will continue to monitor.        Problem: Adult Inpatient Plan of Care  Goal: Plan of Care Review  Outcome: Progressing  Goal: Patient-Specific Goal (Individualization)  Outcome: Progressing  Goal: Absence of Hospital-Acquired Illness or Injury  Outcome: Progressing  Goal: Optimal Comfort and Wellbeing  Outcome: Progressing  Goal: Readiness for Transition of Care  Outcome: Progressing  Goal: Rounds/Family Conference  Outcome: Progressing     Problem: Fall Injury Risk  Goal: Absence of Fall and Fall-Related Injury  Outcome: Progressing     Problem: Perinatal Fall Injury Risk  Goal: Absence of Fall, Infant Drop and Related Injury  Outcome: Progressing

## 2018-01-18 NOTE — Unmapped (Signed)
Adult Nutrition Consult     Visit Type: RN Consult via Admission Screen for Gained or lost 10 pounds in the past 3 months     Reason for Visit:  Assessment    ASSESSMENT:  HPI & PMH: Patient is a 77 y.o. female admitted for fall??with left temporal head trauma.??Fall likely secondary to vagal response. Patient also with vagal episode in ED (Unresponsive for 30-45sec with normal vital signs)     PMH significant for leukemia, non-Hodgkin lymphoma.     Nutrition Hx: Unable to obtain history as patient was unavailable during multiple attempted visits, with no family at bed side.     Nutritionally Pertinent Meds: Colace, famotidine, mirtazapine, multivitamin with minerals, Miralax, NS at 100 mL/hr (provides 2400 mL fluid per day).     Labs: BMP unremarkable.     Abd/GI: LBM 09/19 per documentation.      Skin:   Patient Lines/Drains/Airways Status    Active Wounds     Name:   Placement date:   Placement time:   Site:   Days:    Wound 01/16/18 Other (comment) Head Left;Upper   01/16/18    2154    Head   1               Current nutrition therapy order:   Nutrition Orders          Nutrition Therapy General (Regular) starting at 09/18 1728           Anthropometric Data:  -- Height: 170.2 cm (5' 7)   -- Last recorded weight: 77.1 kg (170 lb)  -- Admission weight: No new weights since admission  -- IBW: 61.27 kg  -- Percent IBW: 126%  -- BMI: Body mass index is 26.63 kg/m??.     Last 5 Recorded Weights    01/16/18 2119   Weight: 77.1 kg (170 lb)      -- Weight history PTA: -7.3 kg (8.6%) since 12/04/17 (1 months, 14 days) per documentation.    Wt Readings from Last 10 Encounters:   01/16/18 77.1 kg (170 lb)   01/15/18 81.3 kg (179 lb 3.2 oz)   12/18/17 81.9 kg (180 lb 8.9 oz)   12/18/17 81.9 kg (180 lb 9.6 oz)   12/10/17 81.3 kg (179 lb 3.2 oz)   12/04/17 84.4 kg (186 lb)   11/27/17 82.5 kg (181 lb 12.8 oz)   11/27/17 81.9 kg (180 lb 8.9 oz)   11/21/17 83.7 kg (184 lb 8.4 oz)   11/20/17 82.4 kg (181 lb 10.5 oz)        Daily Estimated Nutrient Needs:   Energy: 1930-2310 kcals [25-30 kcal/kg using admission body weight, 77.1 kg(77.1) (01/18/18 1520)]  Protein: 95-115 gm [1.2-1.5 gm/kg using  , 77.1 kg (01/18/18 1520)]  Carbohydrate:   [< / equal to 45% of kcal]  Fluid:   [per MD team]     Nutrition Focused Physical Exam:  Obtain when history can be given.      DIAGNOSIS:  Malnutrition Assessment using AND/ASPEN Clinical Characteristics:    Unable to complete malnutrition assessment at this time due to lack of physical exam/history.     Overall nutrition impression: Patient with increased nutrient needs 2/2 recent fall and would benefit from addition of a PO nutrition supplement. A standard supplement is appropriate for this patient.     - Increased nutrient needs (protein) related to recent fall as evidenced by patient assessment, estimated nutrition needs.    GOALS:  - Patient  to meet 75% of estimated energy needs through PO intake of meals and/or supplements.    RECOMMENDATIONS AND INTERVENTIONS:  - Add Ensure Plus (Calorie Dense) chocolate flavor to meals.   - Continue to encourage PO intake.   - Consider temporary nutrition support if patient unable to meet estimated needs through PO intake.     RD Follow Up Parameters:  1-2 times per week (and more frequent as indicated)     Thank you for allowing me to participate in the care of this patient. Please feel free to page me with any questions.     Karie Chimera, MS, RD, LDN, CNSC  Pager: [phone]

## 2018-01-18 NOTE — Unmapped (Signed)
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 *      OR Procedures: None     Ancillary Procedures: no procedures    Discharge Day Services:     Subjective     NAEO.  Pain well controlled with current regimen, complaining of some mild hip pain and scalp pain. Some bloody drainage from scalp wound overnight. Patient denies any fevers or chills.  Denies any N/V.  Positive ROBF.  Voiding appropriately.  Denies any chest pain or SOB currently.  Denies any dizziness or weakness.    Objective   Patient Vitals for the past 8 hrs:   BP Temp Temp src Pulse Resp SpO2   01/19/18 0622 118/57 36.6 ??C Oral 68 18 97 %     No intake/output data recorded.  Physical exam:  -General:  Appropriate, significant ecchymoses on left face (improving).  Left scalp laceration x2 closed primarily, intact.  -Neurological: Moves all 4 extremities spontaneously.   -Cardiovascular: HDS. Tenderness to palpation along right chest wall.  -Pulmonary: Normal work of breathing.   -Abdomen: Soft, non-tender, non-distended. No rebound or guarding.   -Extremities: Warm, well perfused.       Hospital Course:  77 yo F Hx of CLL (On venetoclax). Presented 01/16/18 as yellow trauma for vagal episode with subsequent fall where she struck her head on a night stand. Superficial arterial head bleed controlled with local repair in ED on arrival. CT head without intracranial bleed. Hgb down-trended to 10.4 from 12.2 on admission. Platelets down-trended to 18 from 39. Platelets stabilized at 59K following administration of a total of 3 units of platelets.  Hemoglobin and vital signs stabilized without intervention. .      Due to her possible syncopal episode, a syncope workup was obtained which showed:  - EKG: Normal sinus rhythm with left axis deviation and left bundle branch block  - Carotid duplex:59% stenosis R ICA, 30% stenosis L ICA.  Repeat carotid duplex in 1 year with PCP  - Echocardiography: Normal EF > 55%, grade 1 diastolic dysfunction, aortic sclerosis, moderate dilation of the left atrium, mild mitral/tricuspid regurgitation, normal right ventricular systolic function, borderline elevated pulmonary artery systolic pressure.  Follow-up with PCP  - TSH: Within normal limits    Ultimately, no anatomical or pathological process was identified as the underlying cause of her syncopal event.    Hematology/oncology was consulted on admission and recommended the following:  - Hold venetoclax  temporarily in light of cytopenias / thrombocytopenia  - Maintain at goal plt >50 while in house, and Hgb >7.      Overall, the patient did well following arrival on the floor.  Her diet was slowly advanced and at the time of discharge she was tolerating a regular diet.  A tertiary trauma survey was performed on Hospital Day 1, which revealed nonspecific right upper quadrant pain.  An abdominal ultrasound was ordered revealing hepatomegaly, with likely hepatic  steatosis.   Patient was informed of these findings and instructed to follow-up with her PCP for further management. The patient was able to void spontaneously, have her pain controlled with P.O. pain medication, and return to former ambulatory status.  Patient was evaluated by Physical Therapy and Occupational Therapy; all consultants found patient to be fit for discharge. She will be discharged home on 01/19/2018. She was instructed to follow-up in clinic in 2-3 weeks.               Condition at Discharge: Improved  Discharge Medications: Medication List      CONTINUE taking these medications    allopurinol 300 MG tablet  Commonly known as:  ZYLOPRIM  Take 1 tablet (300 mg total) by mouth daily.     fesoterodine 4 mg 24 hr tablet  Commonly known as:  TOVIAZ     fluticasone propion-salmeterol 100-50 mcg/dose diskus  Commonly known as:  ADVAIR     LORazepam 0.5 MG tablet  Commonly known as:  ATIVAN     mirtazapine 7.5 MG tablet  Commonly known as:  REMERON     multivitamin per tablet  Commonly known as:  TAB-A-VITE/THERAGRAN     oxyCODONE 5 MG immediate release tablet  Commonly known as:  ROXICODONE  Take 1 tablet (5 mg total) by mouth every eight (8) hours as needed for   pain.     pantoprazole 40 MG tablet  Commonly known as:  PROTONIX     pravastatin 80 MG tablet  Commonly known as:  PRAVACHOL     predniSONE 10 MG tablet  Commonly known as:  DELTASONE     ranitidine 150 MG capsule  Commonly known as:  ZANTAC  Take 1 capsule (150 mg total) by mouth Two (2) times a day.     sertraline 50 MG tablet  Commonly known as:  ZOLOFT     topiramate 50 MG tablet  Commonly known as:  TOPAMAX     * VENCLEXTA STARTING PACK 10 mg-50 mg- 100 mg tablet  Generic drug:  venetoclax  TAKE BY MOUTH AS DIRECTED ON PACKAGE LABELING     * VENCLEXTA 100 mg tablet  Generic drug:  venetoclax  TAKE 4 TABLETS BY MOUTH ONCE DAILY WITH A MEAL AND WATER - DO NOT CHEW   CRUSH OR BREAK TABLETS         * This list has 2 medication(s) that are the same as other medications   prescribed for you. Read the directions carefully, and ask your doctor or   other care provider to review them with you.              Pending Test Results: None    Discharge Instructions:  Future Appointments:  Appointments which have been scheduled for you    Jan 22, 2018 12:00 PM EDT  (Arrive by 11:30 AM)  NURSE LAB DRAW with ADULT ONC LAB  [facility] ADULT ONCOLOGY LAB DRAW STATION [facility] REGION) [address]  [place] [address]  [phone]      Jan 22, 2018  1:00 PM EDT (Arrive by 12:30 PM)  RETURN ACTIVE [place] with Vernie Murders, [place]  [place] HEMATOLOGY ONCOLOGY 2ND FLR CANCER HOSP [facility]ION) [address]  [place] [address]  [phone]      Jan 22, 2018  1:30 PM EDT  (Arrive by 1:00 PM)  LEVEL 300 with ONCINF CHAIR 47  [place] ONCOLOGY INFUSION [place] [facility]) [address]  [place] [address]  [phone]      May 07, 2018  1:45 PM EST  (Arrive by 1:30 PM)  RETURN  VOICE with Hardie Pulley, MD  [facility] OTOLARYNGOLOGY [address] [place] REGION) [address]  [place] [address]  [phone]

## 2018-01-18 NOTE — Unmapped (Signed)
Service Date: 01/18/2018  Admit Date: 01/16/2018, Hospital Day: 3  Hospital Service: [facility])  Attending: Suella Broad, MD    Assessment   77 y.o. 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 Fall likely secondary to vagal response. Admit EKG with left bundle branch block. Completing syncope work-up.   ??  Known injuries:  - Left temporal laceration/ flap with arterial bleed. Hemostasis achieved in trauma bay with 4-0 vicryl. Patient with vagal response and became unresponsive during repair, strong femoral pulse with rate of approximately 60 bpm throughout episode. Patient awoke to painful stimuli after 30-45 sec.    Interval Events/subjective   NAEO.  Patient OOB and ambulating.  Pain well controlled with current regimen.  Denies any dizziness or lightheadedness.  Denies any chest pain or SOB.  Will obtain echo today with possible discharge.    Plan   Neuro: pain well-controlled.  History of anxiety.  -Scheduled Tylenol  -Home Topamax, Remeron, sertraline  -Home Ativan as needed  -PRN oxycodone     CV: HDS.  Concern for vasovagal syncope  -Syncope work-up:    ???TSH: WNL   ???Carotid duplex: 59% stenosis R ICA, 30% stenosis L ICA.  Repeat carotid duplex in 1 year with PCP   ???Echo pending    -9/18 EKG: Left bundle branch block, no ST elevation  -Home pravastatin    Pulm: stable on room air  -Home Advair    F: Medlocked.    E: No active electrolyte abnormalities    N:Nutrition Therapy General (Regular).    Gi:   - Scheduled Colace, MiraLAX  - Zofran PRN    GU: Voiding spontaneously.     Heme/ID: History of CLL with myelosuppression, on venetoclax .  -Heme-onc following, appreciate recommendations  -Holding venetoclax per heme-onc recommendation  -Thrombocytopenia: Transfused 1 unit overnight F/U CBC  -Leukopenia: Continue to monitor CBC    Endo: No active issues    Prophylaxis:  - DVT: Holding at this time secondary to scalp bleed. SCDs.   - GI: Pepcid    Disposition: Floor status.   -PT/OT recs: Pending  -Home health needs:      Objective     Vitals:   Temp:  [35.8 ??C-36.9 ??C] 36.1 ??C  Heart Rate:  [70-95] 77  Resp:  [18-20] 18  BP: (100-130)/(54-61) 100/55  MAP (mmHg):  [73-88] 73  SpO2:  [94 %-100 %] 98 %    Physical Exam:  -General:  Appropriate, significant ecchymoses on left face.  Left scalp laceration x2 closed primarily, intact.  -Neurological: Moves all 4 extremities spontaneously.   -Cardiovascular: HDS. Tenderness to palpation along right chest wall.  -Pulmonary: Normal work of breathing.   -Abdomen: Soft, non-tender, non-distended. No rebound or guarding.   -Extremities: Warm, well perfused.     Recent Imaging:  Xr Chest Portable    Result Date: 01/16/2018  EXAM: XR CHEST PORTABLE DATE: 01/17/1999 10:41 PM ACCESSION: [id number] UN DICTATED: 01/16/2018 2:22 PM INTERPRETATION LOCATION: [facility]     CLINICAL INDICATION: 77 years old Female with Trauma      COMPARISON: 08/23/2017 and 10/22/2017     TECHNIQUE: Portable Chest Radiograph.     FINDINGS:     Right chest port catheter terminates over the cavoatrial junction.     Decreased lung volumes with crowded lung markings. No focal consolidation.     No pleural effusion or pneumothorax     Apparent widening of  superior mediastinum, likely attributable to projection and patient positioning.     Moderate to large hiatal hernia. Chronic left seventh rib deformity. No acute osseous abnormality.                 No acute airspace disease or osseous abnormality.     Apparent widening of superior mediastinum, likely attributable to projection and patient positioning. Recommend repeat radiograph with proper positioning or chest CT if there is high index of clinical suspicion for thoracic trauma PA    Ct Head Wo Contrast    Result Date: 01/16/2018  EXAM: Computed tomography, head or brain without contrast material. DATE: 01/16/2018 2:39 PM ACCESSION: [id number] UN DICTATED: 01/16/2018 3:03 PM INTERPRETATION LOCATION: [facility]     CLINICAL INDICATION: 77 years old Female with Trauma      COMPARISON: None     TECHNIQUE: Axial CT images of the head  from skull base to vertex without contrast.     FINDINGS: There are scattered and confluent hypodense foci within the periventricular and deep white matter. These are nonspecific but commonly associated with small vessel ischemic changes. Mild cerebral atrophy. There is no midline shift or mass lesion.  There is no evidence of intracranial hemorrhage or acute infarct. No fractures are evident.  The sinuses are pneumatized.         - No acute intracranial abnormality.    Ct Chest Wo Contrast    Result Date: 01/17/2018  EXAM: CT CHEST WO CONTRAST DATE: 01/17/2018 2:48 AM ACCESSION: [id number] UN DICTATED: 01/17/2018 2:48 AM INTERPRETATION LOCATION: [facility]     CLINICAL INDICATION: 77 years old Female with Chest Pain      COMPARISON: Chest CT 6/24/20191     TECHNIQUE: A spiral CT scan was obtained without IV contrast from the thoracic inlet through the hemidiaphragms. Images were reconstructed in the axial plane.  Coronal and sagittal reformatted images of the chest were also provided for further evaluation of the lung parenchyma.     FINDINGS:     AIRWAYS, LUNGS, PLEURA: Clear central tracheobronchial tree.  No lung consolidation.  No pleural effusion.     MEDIASTINUM: Large hiatal hernia with near complete herniation of the stomach through the diaphragm. Right chest port with tip in right atrium. Normal heart size. No pericardial effusion. Normal caliber thoracic aorta.  No mediastinal lymphadenopathy.     IMAGED ABDOMEN: Large hiatal hernia, otherwise unremarkable.     SOFT TISSUES: Unremarkable.     BONES: Acromioclavicular osteoarthrosis bilaterally. Right greater than left glenohumeral osteoarthrosis.                 Large hiatal hernia. Otherwise unremarkable chest CT.     ATTENDING ADDENDUM BY DR. Margit Banda ON 01/17/2018 AT 7:16 AM:     Coronary atherosclerosis.    Pvl Carotid Duplex Bilateral    Result Date: 01/17/2018    Peripheral Vascular Lab     [address]   [place], [place] [address]  PVL CAROTID DUPLEX BILATERAL Patient Demographics Pt. Name: Katrina Gould Location: PVL Inpatient Lab MRN:      [id number]          Sex:      F DOB:      1941-03-01        Age:      7 years  Study Information Authorizing         [phone] Thamas Jaegers    Performed Time       01/17/2018 2:28:52 Provider Name  MOSES-LEBRON                               PM Ordering Physician  Estrellita Ludwig   Patient Location     [facility] Accession Number    [id number] UN         Technologist         Tanja Port RVT Diagnosis:                                Assisting                                           Technologist Ordered Reason For Exam: syncope  Other Indication: Passed out yesterday, hit her head on a nightstand. Other Factors:    Leukemia.  Examination Protocol: The extracranial carotid systems are interrogated from beneath the clavicle to the angle of the mandible. The vertebral arteries are assessed in the mid cervical region and are traced to the origin when possible. The subclavian arteries are assessed proximally. Using ACAS and NASCET methodology, Doppler velocities are used to classify stenosis according to criteria developed and validated at [facility]. Image is used to subdivide class 1.  ICA Class Systolic Velocity (Vp) Diastolic Velocity (Vd)    Category     1           <160 cm/s               <80 cm/s             0-39 %     2           >160 cm/s               <80 cm/s             40-59%     3           >160 cm/s              80-109 cm/s       possibly > 60%     4           >160 cm/s               >110 cm/s            60-99%     5             0 cm/s                 0 cm/s             occluded     6            see text               see text            see text Vessel Right side (velocities in cm/s) Left side (velocities in cm/s)            PSV EDV Class  PSV EDV Class CCA prox   80   6         61   9 CCA mid    69  11         65  12  CCA dist   73  17         62  13 ICA prox   250 78    2    63  23    1 ICA mid    98  31    1    104 31    1 ICA dist   89  29    1    107 30    1 ECA        193 12         93   4 Vertebral  39   5         52  21 Subclavian 155            118  Right: CCA prox   No plaque visualized. CCA mid    No plaque visualized. CCA dist   Velocities are characteristic of a non-hemodynamically significant            stenosis. ICA prox   40-59% stenosis by Doppler criteria. Plaque at proximal appears focal            and hyperechoic. ICA mid    No plaque visualized. ICA dist   No abnormality visualized. ECA        No plaque visualized. Vertebral  Antegrade Subclavian Multiphasic, WNL  Left: CCA prox   No plaque visualized. CCA mid    No plaque visualized. CCA dist   Velocities are characteristic of a non-hemodynamically significant            stenosis. ICA prox   20-30% narrowing by image. Plaque at proximal appears focal and            hyperechoic. ICA mid    No plaque visualized. ICA dist   No abnormality visualized. ECA        No plaque visualized. Vertebral  Antegrade Subclavian Multiphasic, WNL  Final Interpretation Right Carotid: There is evidence in the ICA of a 40-59% stenosis.                Non-hemodynamically significant plaque noted in the CCA. Left Carotid: There is evidence in the ICA of a less than 40% stenosis.               Non-hemodynamically significant plaque noted in the CCA. Vertebrals:  Both vertebral arteries were patent with antegrade flow. Subclavians: Normal flow hemodynamics were seen in bilateral subclavian              arteries. Electronically signed by [id number] Jodell Cipro MD on 01/17/2018 at 3:38:02 PM.   Final     Ct Cervical Spine Screening    Result Date: 01/16/2018  EXAM: CT CERVICAL SPINE SCREENING DATE: 01/16/2018 2:39 PM ACCESSION: [id number] UN DICTATED: 01/16/2018 2:47 PM INTERPRETATION LOCATION: [facility]     CLINICAL INDICATION: 76 years old Female with Trauma      COMPARISON: Correlation with same-day head CT and outside cervical spine MRI dated 08/23/2017     TECHNIQUE: A spiral CT scan of the cervical spine was obtained without administration of IV contrast. Contiguous axial, coronal and sagittal bone algorithm images were reconstructed at 2-mm increments.]     FINDINGS: Bones are osteopenic. Old fracture deformity of C7 spinous process. No acute fracture. No listhesis. There is diffuse cervical and upper thoracic disc space narrowing most pronounced and moderate to severe at C5-C6, C6-C7, and C7-T1. There are discogenic endplate changes of sclerosis and osteophytosis. There is moderate multilevel  facet joint narrowing with sclerosis and osseous overgrowth.     Skull base is intact.     No sizable paraspinal hematoma. There are atherosclerotic calcifications of the carotid arteries and visualized aortic arch. Partially visualized right sided Mediport catheter tubing.         No acute fracture or traumatic malalignment of the cervical spine.     Multilevel cervical spine degenerative disc disease, moderate to severe at C5-C6, C6-C7, and C7-T1, and moderate multilevel facet arthropathy.    Xr Trauma Hip Right    Result Date: 01/16/2018  EXAM: XR TRAUMA HIP RIGHT DATE: 01/16/2018 3:26 PM ACCESSION: [id number] UN DICTATED: 01/16/2018 3:32 PM INTERPRETATION LOCATION: [facility]     CLINICAL INDICATION: 77 years old Female with bruising and ttp over posterior right hip s/p fall. eval for fracture.      COMPARISON: Pelvis radiographs 01/16/2018; CT abdomen pelvis with contrast 10/29/2017     TECHNIQUE: AP views of the pelvis and right femur and cross table lateral views.     FINDINGS: No acute fracture. Mild femoral acetabular joint space narrowing with osseous overgrowth of the acetabular roof. Knee is approximated. Single AP view of the left hip reveals no acute osseous abnormality, with mild femoral acetabular joint space narrowing and osseous overgrowth of the acetabular roof. Sacroiliac joints and pubic symphysis are approximated. Nonobstructive bowel gas pattern.         Mild right hip osteoarthrosis. No acute osseous abnormality.    Xr Pelvis Ap Portable    Result Date: 01/16/2018  EXAM: XR PELVIS 1 OR 2 VIEWS DATE: 01/16/2018 2:24 PM ACCESSION: [id number] UN DICTATED: 01/16/2018 2:26 PM INTERPRETATION LOCATION: [facility]     CLINICAL INDICATION: 77 years old Female with Trauma      COMPARISON: None.     TECHNIQUE: AP view of the pelvis.     FINDINGS: No acute fracture or dislocation. Hip joint spaces are preserved with small marginal osteophytes bilaterally. Mild degenerative change of the SI joints.         No acute osseous findings.

## 2018-01-19 NOTE — Unmapped (Signed)
All safety measures in place. Voiding freely. VSS. Denies pain at this time. No falls or injury during the shift.Patient able to turn self in bed  ambulating in room with standby assist. Remains on telemetery, no calls made. Will continue to monitor.  Problem: Adult Inpatient Plan of Care  Goal: Plan of Care Review  Outcome: Progressing  Goal: Patient-Specific Goal (Individualization)  Outcome: Progressing  Goal: Absence of Hospital-Acquired Illness or Injury  Outcome: Progressing  Goal: Optimal Comfort and Wellbeing  Outcome: Progressing  Goal: Readiness for Transition of Care  Outcome: Progressing  Goal: Rounds/Family Conference  Outcome: Progressing     Problem: Fall Injury Risk  Goal: Absence of Fall and Fall-Related Injury  Outcome: Progressing     Problem: Perinatal Fall Injury Risk  Goal: Absence of Fall, Infant Drop and Related Injury  Outcome: Progressing

## 2018-01-22 ENCOUNTER — Ambulatory Visit: Admit: 2018-01-22 | Discharge: 2018-01-23 | Payer: MEDICARE

## 2018-01-22 ENCOUNTER — Other Ambulatory Visit: Admit: 2018-01-22 | Discharge: 2018-01-23 | Payer: MEDICARE

## 2018-01-22 ENCOUNTER — Ambulatory Visit: Admit: 2018-01-22 | Discharge: 2018-01-23 | Payer: MEDICARE | Attending: Adult Health | Primary: Adult Health

## 2018-01-22 DIAGNOSIS — T451X5A Adverse effect of antineoplastic and immunosuppressive drugs, initial encounter: Secondary | ICD-10-CM

## 2018-01-22 DIAGNOSIS — C9110 Chronic lymphocytic leukemia of B-cell type not having achieved remission: Principal | ICD-10-CM

## 2018-01-22 DIAGNOSIS — R55 Syncope and collapse: Secondary | ICD-10-CM

## 2018-01-22 DIAGNOSIS — Z0000 Encounter for general adult medical examination without abnormal findings: Secondary | ICD-10-CM

## 2018-01-22 DIAGNOSIS — D591 Other autoimmune hemolytic anemias: Secondary | ICD-10-CM

## 2018-01-22 DIAGNOSIS — D701 Agranulocytosis secondary to cancer chemotherapy: Secondary | ICD-10-CM

## 2018-01-22 DIAGNOSIS — M533 Sacrococcygeal disorders, not elsewhere classified: Secondary | ICD-10-CM

## 2018-01-22 DIAGNOSIS — R638 Other symptoms and signs concerning food and fluid intake: Secondary | ICD-10-CM

## 2018-01-22 DIAGNOSIS — R197 Diarrhea, unspecified: Secondary | ICD-10-CM

## 2018-01-22 LAB — COMPREHENSIVE METABOLIC PANEL
ALBUMIN: 3.5 g/dL (ref 3.5–5.0)
ALKALINE PHOSPHATASE: 75 U/L (ref 38–126)
ALT (SGPT): 9 U/L — ABNORMAL LOW (ref 15–48)
ANION GAP: 11 mmol/L (ref 9–15)
AST (SGOT): 23 U/L (ref 14–38)
BILIRUBIN TOTAL: 0.6 mg/dL (ref 0.0–1.2)
BLOOD UREA NITROGEN: 15 mg/dL (ref 7–21)
BUN / CREAT RATIO: 20
CALCIUM: 8.9 mg/dL (ref 8.5–10.2)
CHLORIDE: 109 mmol/L — ABNORMAL HIGH (ref 98–107)
CREATININE: 0.74 mg/dL (ref 0.60–1.00)
EGFR CKD-EPI AA FEMALE: 89 mL/min/{1.73_m2} (ref >=60)
EGFR CKD-EPI NON-AA FEMALE: 77 mL/min/{1.73_m2} (ref >=60)
GLUCOSE RANDOM: 139 mg/dL (ref 65–179)
POTASSIUM: 3.6 mmol/L (ref 3.5–5.0)
PROTEIN TOTAL: 5.9 g/dL — ABNORMAL LOW (ref 6.5–8.3)
SODIUM: 140 mmol/L (ref 135–145)

## 2018-01-22 LAB — CBC W/ AUTO DIFF
BASOPHILS ABSOLUTE COUNT: 0 10*9/L (ref 0.0–0.1)
BASOPHILS RELATIVE PERCENT: 0.3 %
EOSINOPHILS ABSOLUTE COUNT: 0 10*9/L (ref 0.0–0.4)
EOSINOPHILS RELATIVE PERCENT: 0.4 %
HEMATOCRIT: 28.2 % — ABNORMAL LOW (ref 36.0–46.0)
HEMOGLOBIN: 9.6 g/dL — ABNORMAL LOW (ref 12.0–16.0)
LARGE UNSTAINED CELLS: 2 % (ref 0–4)
LYMPHOCYTES ABSOLUTE COUNT: 0.7 10*9/L — ABNORMAL LOW (ref 1.5–5.0)
MEAN CORPUSCULAR HEMOGLOBIN CONC: 33.9 g/dL (ref 31.0–37.0)
MEAN CORPUSCULAR VOLUME: 94.7 fL (ref 80.0–100.0)
MEAN PLATELET VOLUME: 9.3 fL (ref 7.0–10.0)
MONOCYTES RELATIVE PERCENT: 4.9 %
NEUTROPHILS ABSOLUTE COUNT: 1.2 10*9/L — ABNORMAL LOW (ref 2.0–7.5)
NEUTROPHILS RELATIVE PERCENT: 57.5 %
PLATELET COUNT: 69 10*9/L — ABNORMAL LOW (ref 150–440)
RED BLOOD CELL COUNT: 2.98 10*12/L — ABNORMAL LOW (ref 4.00–5.20)
RED CELL DISTRIBUTION WIDTH: 20.1 % — ABNORMAL HIGH (ref 12.0–15.0)
WBC ADJUSTED: 2.1 10*9/L — ABNORMAL LOW (ref 4.5–11.0)

## 2018-01-22 LAB — EGFR CKD-EPI NON-AA FEMALE: Lab: 80

## 2018-01-22 LAB — URIC ACID
Urate:MCnc:Pt:Ser/Plas:Qn:: 3.7
Urate:MCnc:Pt:Ser/Plas:Qn:: 4.5

## 2018-01-22 LAB — SMEAR REVIEW

## 2018-01-22 LAB — BASIC METABOLIC PANEL
ANION GAP: 7 mmol/L — ABNORMAL LOW (ref 9–15)
BLOOD UREA NITROGEN: 14 mg/dL (ref 7–21)
BUN / CREAT RATIO: 19
CALCIUM: 8.7 mg/dL (ref 8.5–10.2)
CHLORIDE: 111 mmol/L — ABNORMAL HIGH (ref 98–107)
CO2: 21 mmol/L — ABNORMAL LOW (ref 22.0–30.0)
CREATININE: 0.72 mg/dL (ref 0.60–1.00)
GLUCOSE RANDOM: 132 mg/dL (ref 65–179)
POTASSIUM: 3.9 mmol/L (ref 3.5–5.0)
SODIUM: 139 mmol/L (ref 135–145)

## 2018-01-22 LAB — PHOSPHORUS
Phosphate:MCnc:Pt:Ser/Plas:Qn:: 3.1
Phosphate:MCnc:Pt:Ser/Plas:Qn:: 3.6

## 2018-01-22 LAB — LACTATE DEHYDROGENASE
Lactate dehydrogenase:CCnc:Pt:Ser/Plas:Qn:: 810 — ABNORMAL HIGH
Lactate dehydrogenase:CCnc:Pt:Ser/Plas:Qn:: 999 — ABNORMAL HIGH

## 2018-01-22 LAB — MEAN CORPUSCULAR HEMOGLOBIN CONC: Lab: 33.9

## 2018-01-22 LAB — GLUCOSE RANDOM: Glucose:MCnc:Pt:Ser/Plas:Qn:: 139

## 2018-01-22 LAB — SLIDE REVIEW

## 2018-01-22 MED ORDER — ALLOPURINOL 300 MG TABLET
ORAL_TABLET | Freq: Every day | ORAL | 2 refills | 0 days | Status: CP
Start: 2018-01-22 — End: 2018-05-07

## 2018-01-22 NOTE — Unmapped (Signed)
Labs found to be within parameters for treatment today. Request for drug sent to pharmacy.

## 2018-01-22 NOTE — Unmapped (Signed)
If you feel like this is an emergency please call 911.  For appointments or questions Monday through Friday 8AM-5PM please call [phone] or Toll Free [phone]. For Medical questions or concerns ask for the Nurse Triage Line.  On Nights, Weekends, and Holidays call [phone] and ask for the Oncologist on Call.  Reasons to call the Nurse Triage Line:  Fever of 100.5 or greater  Nausea and/or vomiting not relived with nausea medicine  Diarrhea or constipation  Severe pain not relieved with usual pain regimen  Shortness of breath  Uncontrolled bleeding  Mental status changes    Office Visit on 01/22/2018   Component Date Value Ref Range Status   ??? LDH 01/22/2018 810* 338 - 610 U/L Final   Lab on 01/22/2018   Component Date Value Ref Range Status   ??? Uric Acid 01/22/2018 4.5  3.0 - 6.5 mg/dL Final   ??? Phosphorus 01/22/2018 3.6  2.9 - 4.7 mg/dL Final   ??? Sodium 02/72/5366 140  135 - 145 mmol/L Final   ??? Potassium 01/22/2018 3.6  3.5 - 5.0 mmol/L Final   ??? Chloride 01/22/2018 109* 98 - 107 mmol/L Final   ??? CO2 01/22/2018 20.0* 22.0 - 30.0 mmol/L Final   ??? BUN 01/22/2018 15  7 - 21 mg/dL Final   ??? Creatinine 01/22/2018 0.74  0.60 - 1.00 mg/dL Final   ??? BUN/Creatinine Ratio 01/22/2018 20   Final   ??? EGFR CKD-EPI Non-African American,* 01/22/2018 77  >=60 mL/min/1.40m2 Final   ??? EGFR CKD-EPI African American, Fem* 01/22/2018 89  >=60 mL/min/1.63m2 Final   ??? Glucose 01/22/2018 139  65 - 179 mg/dL Final   ??? Calcium 44/06/4740 8.9  8.5 - 10.2 mg/dL Final   ??? Albumin 59/56/3875 3.5  3.5 - 5.0 g/dL Final   ??? Total Protein 01/22/2018 5.9* 6.5 - 8.3 g/dL Final   ??? Total Bilirubin 01/22/2018 0.6  0.0 - 1.2 mg/dL Final   ??? AST 64/33/2951 23  14 - 38 U/L Final   ??? ALT 01/22/2018 9* 15 - 48 U/L Final   ??? Alkaline Phosphatase 01/22/2018 75  38 - 126 U/L Final   ??? Anion Gap 01/22/2018 11  9 - 15 mmol/L Final   ??? WBC 01/22/2018 2.1* 4.5 - 11.0 10*9/L Final   ??? RBC 01/22/2018 2.98* 4.00 - 5.20 10*12/L Final   ??? HGB 01/22/2018 9.6* 12.0 - 16.0 g/dL Final   ??? HCT 88/41/6606 28.2* 36.0 - 46.0 % Final   ??? MCV 01/22/2018 94.7  80.0 - 100.0 fL Final   ??? MCH 01/22/2018 32.1  26.0 - 34.0 pg Final   ??? MCHC 01/22/2018 33.9  31.0 - 37.0 g/dL Final   ??? RDW 30/16/0109 20.1* 12.0 - 15.0 % Final   ??? MPV 01/22/2018 9.3  7.0 - 10.0 fL Final   ??? Platelet 01/22/2018 69* 150 - 440 10*9/L Final   ??? Variable HGB Concentration 01/22/2018 Slight* Not Present Final   ??? Neutrophils % 01/22/2018 57.5  % Final   ??? Lymphocytes % 01/22/2018 34.7  % Final   ??? Monocytes % 01/22/2018 4.9  % Final   ??? Eosinophils % 01/22/2018 0.4  % Final   ??? Basophils % 01/22/2018 0.3  % Final   ??? Neutrophil Left Shift 01/22/2018 2+* Not Present Final   ??? Absolute Neutrophils 01/22/2018 1.2* 2.0 - 7.5 10*9/L Final   ??? Absolute Lymphocytes 01/22/2018 0.7* 1.5 - 5.0 10*9/L Final   ??? Absolute Monocytes 01/22/2018 0.1* 0.2 -  0.8 10*9/L Final   ??? Absolute Eosinophils 01/22/2018 0.0  0.0 - 0.4 10*9/L Final   ??? Absolute Basophils 01/22/2018 0.0  0.0 - 0.1 10*9/L Final   ??? Large Unstained Cells 01/22/2018 2  0 - 4 % Final   ??? Macrocytosis 01/22/2018 Moderate* Not Present Final   ??? Anisocytosis 01/22/2018 Moderate* Not Present Final   ??? Hypochromasia 01/22/2018 Slight* Not Present Final   ??? Smear Review Comments 01/22/2018 See Comment* Undefined Final    Slide reviewed     ??? Polychromasia 01/22/2018 Slight* Not Present Final   ??? Toxic Granulation 01/22/2018 Present* Not Present Final   ??? Glucose, POC 01/22/2018 88  65 - 179 mg/dL Final

## 2018-01-22 NOTE — Unmapped (Signed)
[facility] Leukemia [facility] Follow Up       Patient Name: Katrina Gould  Patient Age: 77 y.o.  Encounter Date: 01/22/2018      Chief complaint/Reason for visit: CLL/SLL    Assessment:  Katrina Gould is a 77 y.o. female who presents for follow of her CLL/SLL, Rai stage 4. Disease markers: 46,XX,der(4)t(4;4)(p14;q21). IGHV unmutated (0%, 3-33*01). B62m 5.05. No TP53 mutation. High-risk by CLL-IPI (6 points).    [name] is here for C4D1 obinituzumab, which was held last week due to cytopenias.  No new lymphadenopathy. ANC (1.2) and platelets (69) increased slightly. Okay to resume chemotherapy. We will reduce her Venetoclax to 200 mg daily given low counts. She will receive obinituzumab and Granix 480 mcg today.    Per patient, syncope has been a problem for many years and no cause has been identified. Most recently, cause likely multifactorial due to worsening diarrhea, lightheadedness, poor nutrition, and deconditioning. Diarrhea has been an ongoing problem for the patient and is likely medication/chemo driven. Advised to take Imodium PRN since this has worked well for her in the past.  From a nutrition standpoint, patient has tried ensure and boost and does not like the taste.  Patient can try to make her own protein shakes at home using protein powder and ingredients she enjoys to help bolster her caloric intake.  She was instructed to take time when changing positions as sudden changes can precipitate dizziness.    She continues with widespread pain secondary to her multiple falls but does not like taking many medications. Using Tylenol as needed with some relief.     Continues to complain of upper, mid to right sided abdominal pain, unrelated to bowel patterns, gas or food.  Abdominal ultrasound from 9/19 during hospitalization showed hepatomegaly with mildly echogenic liver parenchyma compatible with hepatic steatosis.  No evidence of cholelithiasis or bile duct dilation.       Plans and Recommendations:  1. CLL, Rai 4   - Restart Venetoclax 200 mg. First dose today in clinic. Will recheck a BMP, uric acid, phos, and LDH prior to her leaving clinic today.  - Obinituzumab today.  - granix 480 mcg subcutaneously today    2.  Syncope  - avoid sudden changes in position, and move slowly when moving from sitting to standing  - maintain adequate hydration    3.  Nutrition  - can try making protein shakes at home - recommend use of protein powder and experimenting with ingredients to increase PO intake    4. Chemotherapy induced n/v/d  - continue Imodium as needed for loose stools    5. Pain related to falls  - continue tylenol as needed for pain relief  - may use ice/heat for additional comfort    6. Health Maintenance  - would like to receive flu vaccine when available  - continue annual dermatology follow up  - still needs 2nd shingrix vaccine      Due to this patient's diagnosis, she is at significant risk for subsequent morbidity and/or mortality.      Markus Jarvis, AGPCNP-C, MSN, OCN  Nurse Practitioner  Hematologic Malignancies  [facility]  [phone] (phone)  [phone] (fax)  [email or web address]        Interval History:  Patient is doing fair today. She is s/p a recent hospitalization from 9/18-9/20 for a fall at home related to a syncopal episode.  Patient sustained a head laceration which required sutures and experienced a subsequent syncopal episode  during her hospitalization. She reports that she continues to feel dizzy when she changes positions, though she has not had any orthostatic hypotension. Today she reports feeling very weak, shaky, and nervous.  She feels that her hospital stay contributed to her weakness and that she laid in bed more than she would have at home.  She reports being sore all over, and that no one area is worse than another.     She continues with right upper quadrant discomfort, slightly improved from past visit.  She also endorses ongoing diarrhea, worse over the last 24-48 hours.  She has used Imodium in the past with good effect, but has not used in over a week. Appetite remains fair to poor. She feels that food still does not taste good and struggles to eat most of the time. Weight stable at 81.7 kg.    Denies headache, chest pain, SOB, cough, or rash.     Otherwise, she denies new constitutional symptoms such as anorexia, weight loss, drenching night sweats or unexplained fevers.  Furthermore, she denies symptoms of marrow failure:  recurrent or unexplained intercurrent infections.  There have been no new or unexplained pains or self-identified masses, swelling or enlarged lymph nodes.    Past Medical, Surgical and Family History were reviewed and pertinent updates were made in the Electronic Medical Record  ??  Review of Systems:  Other than as reported above in the interim history, the other systems reviewed were unremarkable.  ??  ECOG Performance Status: 1     Oncology History:    Oncology History    CLL    First seen in oncology clinic locally 03/31/16    Had a left axillary biopsy 03/15/16 that confirmed CLL/SLL: Flow showed monoclonal B-cell population with CD5, CD23, kappa light chain. IHC showed positive CD20 (diffuse), negative for cyclin D1.    No constitutional symptoms except some sweats    Labs: Hb 12.0, Hct 34.8, plt 147. WBC was 10.6 with ANC 2.5. ALC was 7.5K.    CLL FISH studies: normal panel. No cells with 11q, tri 12, 17p or 13q, and the 11;14 translocation also not detected.    CT c/a/p 04/04/16 showed mild LAD in chest, abd, and pelvis and spleen was at ULN (13 x 5.6 x 10.9 cm). Largest node 1.6 cm.    Of note is that she had a prior CT on 04/06/15 that showed similarly small enlarged node, largest being 1.9 cm.    CLL-IPI: As per Lancet Oncol Vol 15 October 2014  Includes (1) TP53 status (no abnormalities vs. Del 17p and/or TP53 mutation - 4 points) - no 17p (did not send TP53 mutation testing), (2) IGHV mutation status (mut vs unmut - 2 points) (pending) (3) serum B2-microglobulin (</=3.5 vs >3.5 - 2 points) (gets 2 points here) (4) clinical stage (Rai 0 vs Rai I-IV - 1 point) (gets 1 point here) and (5) age (</=65 vs >65 - 1 point)  (gets 1 point here)     Low risk (0-1 points)= 93.2% OS at 5 years  Int risk (2-3 points)= 79.3%  High risk (4-6 points)= 63.3%  - this patient has 4 points already, pending IGHV testing and prior to treatment would also need to send TP53 mutation testing  Very high risk (7-10 points)= 23.3%               CLL (chronic lymphocytic leukemia) (CMS-HCC)    05/16/2016 Initial Diagnosis     CLL (chronic lymphocytic leukemia) (RAF-HCC)  B-cell chronic lymphocytic leukemia (CMS-HCC)    09/05/2017 - 10/11/2017 Chemotherapy     Chemotherapy Treatment    Treatment Goal Control   Line of Treatment [No plan line of treatment]   Plan Name OP RITUXIMAB    Start Date 09/14/2017   End Date 10/05/2017   Provider Pernell Dupre, MD   Chemotherapy riTUXimab (RITUXAN) 712.5 mg in sodium chloride (NS) 0.9 % 500 mL IVPB, 375 mg/m2 = 712.5 mg, Intravenous, Once, 1 of 1 cycle  Administration: 712.5 mg (09/14/2017)  riTUXimab (RITUXAN) 712.5 mg in sodium chloride (NS) 0.9 % 250 mL rapid infusion, 375 mg/m2 = 712.5 mg, Intravenous, Once, 1 of 1 cycle  Administration: 712.5 mg (09/21/2017), 712.5 mg (09/28/2017), 712.5 mg (10/05/2017)         10/16/2017 Initial Diagnosis     Chronic lymphocytic leukemia, Rai stage IV (CMS-HCC)      10/23/2017 -  Chemotherapy     Chemotherapy Treatment    Treatment Goal Control   Line of Treatment [No plan line of treatment]   Plan Name OP OBINUTUZUMAB AND VENETOCLAX   Start Date 10/23/2017   End Date 03/19/2018 (Planned)   Provider Pernell Dupre, MD   Chemotherapy dexamethasone (DECADRON) tablet 20 mg, 20 mg, Oral, Once, 3 of 6 cycles  Administration: 20 mg (10/23/2017), 20 mg (10/24/2017), 20 mg (10/30/2017), 20 mg (11/06/2017), 20 mg (11/20/2017), 20 mg (12/18/2017)  obinutuzumab 100 mg in sodium chloride (NS) 0.9 % 50 mL IVPB, 100 mg, Intravenous, Once, 3 of 6 cycles  Administration: 100 mg (10/23/2017), 900 mg (10/24/2017), 1,000 mg (10/30/2017), 1,000 mg (11/06/2017), 1,000 mg (11/20/2017), 1,000 mg (12/18/2017)          Social history:  Here with son  Lives alone  Husband died in 10/28/08    Allergies   Allergen Reactions   ??? Sertraline Other (See Comments)     Nervousness, panic, shortness of breath   ??? Topiramate Palpitations     Panic attacks; unable to sleep   ??? Baclofen      made me go crazy         Current Outpatient Medications   Medication Sig Dispense Refill   ??? allopurinol (ZYLOPRIM) 300 MG tablet Take 1 tablet (300 mg total) by mouth daily. 30 tablet 2   ??? fesoterodine (TOVIAZ) 4 mg 24 hr tablet Take by mouth Take as directed.      ??? fluticasone propion-salmeterol (ADVAIR) 100-50 mcg/dose diskus Inhale.     ??? LORazepam (ATIVAN) 0.5 MG tablet Take 0.5 mg by mouth two (2) times a day as needed.      ??? mirtazapine (REMERON) 7.5 MG tablet Take 7.5 mg by mouth nightly.     ??? multivitamin (TAB-A-VITE/THERAGRAN) per tablet Take 1 tablet by mouth daily.      ??? oxyCODONE (ROXICODONE) 5 MG immediate release tablet Take 1 tablet (5 mg total) by mouth every eight (8) hours as needed for pain. 20 tablet 0   ??? pantoprazole (PROTONIX) 40 MG tablet Take by mouth daily at 0600.      ??? pravastatin (PRAVACHOL) 80 MG tablet TAKE 1 TABLET BY MOUTH EVERY NIGHT AT BEDTIME     ??? predniSONE (DELTASONE) 10 MG tablet 6 TABS X 1 DAY, 5 TABS X 1 DAY, 4 TABS X 1 DAY, ETC...  0   ??? ranitidine (ZANTAC) 150 MG capsule Take 1 capsule (150 mg total) by mouth Two (2) times a day. 180 capsule 3   ??? sertraline (ZOLOFT)  50 MG tablet Take 50 mg by mouth daily.  1   ??? topiramate (TOPAMAX) 50 MG tablet Take 50 mg by mouth daily.  3   ??? venetoclax (VENCLEXTA STARTING PACK) 10 mg-50 mg- 100 mg tablet TAKE BY MOUTH AS DIRECTED ON PACKAGE LABELING 42 each 0   ??? venetoclax (VENCLEXTA) 100 mg tablet TAKE 4 TABLETS BY MOUTH ONCE DAILY WITH A MEAL AND WATER - DO NOT CHEW CRUSH OR BREAK TABLETS 120 each 6     No current facility-administered medications for this visit.      Physical exam:  Vitals:    01/22/18 1305   BP: 137/68   Pulse: 88   Resp: 18   Temp: 36.8 ??C (98.3 ??F)   SpO2: 100%     Constitutional: Resting, in no apparent distress  Eyes: PERRL. No scleral icterus or conjunctival injection.  Ear/nose/mouth/throat: Oral mucosa without ulceration, erythema or exudate.   Hematology/lymphatic/immunologic:  No lymphadenopathy in the anterior/posterior cervical, supraclavicular basins.  Cardiovascular:  RRR.  S1, S2.  No murmurs, gallops or rubs. Appear well-perfused.  No clubbing or cyanosis. 1+ edema to bilateral ankles and feet.  Respiratory:  Breathing is unlabored, and patient is speaking full sentences with ease.  No stridor.  CTAB. No rales, ronchi or crackles.    GI:  No distention. Bowel sounds are present and normal in quality.  Mild pain on palpation of right upper quadrant. No palpable hepatomegaly or splenomegaly.  No palpable masses.  Murphy's sign negative.  GU: not examined  Musculoskeletal: +point tenderness on palpation of right posterior thorax, right lower rib and coccyx. No pain on palpation of the spinous processes of the cervical, thoracic or lumbar vertebral bodies. No grossly-evident joint effusions or deformities.  Range of motion about the shoulder, elbow, hips and knees is grossly normal.    Skin:  No rashes or areas of skin breakdown. Large area of ecchymosis around left eye, extending above eyebrow and to cheek bone. Healing laceration to left upper scalp, CDI and well approximated. Sutures CDI to second laceration lateral to left eyebrow. Warm to touch, dry, smooth and even.  Neurologic:  Gait is normal.  Cerebellar tasks are completed with ease and are symmetric.  Psychiatric:  Alert and oriented to person, place, time and situation.  Range of affect is appropriate.      ECOG Performance Status: 1    Results:  Labs and pathology have been reviewed and pertinent results are as follows:    Results for orders placed or performed in visit on 01/22/18   Uric acid   Result Value Ref Range    Uric Acid 4.5 3.0 - 6.5 mg/dL   Phosphorus Level   Result Value Ref Range    Phosphorus 3.6 2.9 - 4.7 mg/dL   Comprehensive Metabolic Panel   Result Value Ref Range    Sodium 140 135 - 145 mmol/L    Potassium 3.6 3.5 - 5.0 mmol/L    Chloride 109 (H) 98 - 107 mmol/L    CO2 20.0 (L) 22.0 - 30.0 mmol/L    BUN 15 7 - 21 mg/dL    Creatinine 0.86 5.78 - 1.00 mg/dL    BUN/Creatinine Ratio 20     EGFR CKD-EPI Non-African American, Female 52 >=60 mL/min/1.72m2    EGFR CKD-EPI African American, Female 64 >=60 mL/min/1.70m2    Glucose 139 65 - 179 mg/dL    Calcium 8.9 8.5 - 46.9 mg/dL    Albumin 3.5 3.5 - 5.0 g/dL  Total Protein 5.9 (L) 6.5 - 8.3 g/dL    Total Bilirubin 0.6 0.0 - 1.2 mg/dL    AST 23 14 - 38 U/L    ALT 9 (L) 15 - 48 U/L    Alkaline Phosphatase 75 38 - 126 U/L    Anion Gap 11 9 - 15 mmol/L   CBC w/ Differential   Result Value Ref Range    WBC 2.1 (L) 4.5 - 11.0 10*9/L    RBC 2.98 (L) 4.00 - 5.20 10*12/L    HGB 9.6 (L) 12.0 - 16.0 g/dL    HCT 16.1 (L) 09.6 - 46.0 %    MCV 94.7 80.0 - 100.0 fL    MCH 32.1 26.0 - 34.0 pg    MCHC 33.9 31.0 - 37.0 g/dL    RDW 04.5 (H) 40.9 - 15.0 %    MPV 9.3 7.0 - 10.0 fL    Platelet 69 (L) 150 - 440 10*9/L    Variable HGB Concentration Slight (A) Not Present    Neutrophils % 57.5 %    Lymphocytes % 34.7 %    Monocytes % 4.9 %    Eosinophils % 0.4 %    Basophils % 0.3 %    Neutrophil Left Shift 2+ (A) Not Present    Absolute Neutrophils 1.2 (L) 2.0 - 7.5 10*9/L    Absolute Lymphocytes 0.7 (L) 1.5 - 5.0 10*9/L    Absolute Monocytes 0.1 (L) 0.2 - 0.8 10*9/L    Absolute Eosinophils 0.0 0.0 - 0.4 10*9/L    Absolute Basophils 0.0 0.0 - 0.1 10*9/L    Large Unstained Cells 2 0 - 4 %    Macrocytosis Moderate (A) Not Present    Anisocytosis Moderate (A) Not Present    Hypochromasia Slight (A) Not Present   Morphology Review   Result Value Ref Range    Smear Review Comments See Comment (A) Undefined    Polychromasia Slight (A) Not Present    Toxic Granulation Present (A) Not Present

## 2018-01-22 NOTE — Unmapped (Addendum)
Restart your venetoclax at 200mg  (2 tablets).  We'll continue this dose for the duration unless Dr. Lonni Fix decides otherwise.    You'll get Obinituzumab and granix today in infusion.    Please take imodium when you have diarrhea.  Take 2 tablets with the first loose stool and then 1 with every loose stool after that.    Get a powdered protein shake and look up recipes online.  We need to stop your diarrhea and get you some nutrition.  This may help you feel better!    Restart your allopurinol tomorrow morning (we'll give you a dose in infusion today).    We'll remove stitches today.    Please return in 4 weeks for a follow up visit and labs.    If you have any questions, please do not hesitate to contact us.    Please contact us for any of these symptoms:    **Fevers over 100.4 are an EMERGENCY. Please go to the nearest ER**    1. New bone pain  2. Unintentional weight loss  3. Unexplained fatigue  4. Swelling of your legs  5. Yellowing of the skin or eyes    When reviewing your results, please remember that the results of many of the tests we order can vary somewhat and that variation often means nothing.  Sometimes when we get results back after your clinic visit, if it looks like there???s some variation of that type, we may decide to recheck things sooner than we discussed in clinic.  If you get a call that we want to recheck things sooner, do not panic. It does not mean that things are going wrong.    For appointments & questions Monday through Friday 8 AM??? 5 PM   please call [phone] or Toll free [phone].    On Nights, Weekends and Holidays  Call [phone] and ask for the adult hematologist/oncologist on call.    Markus Jarvis, AGPCNP-C, MSN, OCN  Nurse Practitioner  Hematologic Malignancies  [facility]    Nurse Navigator: Frankey Poot, RN  Questions and appointments M-F 8am - 5pm: [phone] or [phone]    [facility]  [address]  [place], [place] [address] [email or web address]    Results for orders placed or performed in visit on 01/22/18   Uric acid   Result Value Ref Range    Uric Acid 4.5 3.0 - 6.5 mg/dL   Phosphorus Level   Result Value Ref Range    Phosphorus 3.6 2.9 - 4.7 mg/dL   Comprehensive Metabolic Panel   Result Value Ref Range    Sodium 140 135 - 145 mmol/L    Potassium 3.6 3.5 - 5.0 mmol/L    Chloride 109 (H) 98 - 107 mmol/L    CO2 20.0 (L) 22.0 - 30.0 mmol/L    BUN 15 7 - 21 mg/dL    Creatinine 3.66 4.40 - 1.00 mg/dL    BUN/Creatinine Ratio 20     EGFR CKD-EPI Non-African American, Female 84 >=60 mL/min/1.35m2    EGFR CKD-EPI African American, Female 54 >=60 mL/min/1.9m2    Glucose 139 65 - 179 mg/dL    Calcium 8.9 8.5 - 34.7 mg/dL    Albumin 3.5 3.5 - 5.0 g/dL    Total Protein 5.9 (L) 6.5 - 8.3 g/dL    Total Bilirubin 0.6 0.0 - 1.2 mg/dL    AST 23 14 - 38 U/L    ALT 9 (L) 15 - 48 U/L    Alkaline  Phosphatase 75 38 - 126 U/L    Anion Gap 11 9 - 15 mmol/L   CBC w/ Differential   Result Value Ref Range    WBC 2.1 (L) 4.5 - 11.0 10*9/L    RBC 2.98 (L) 4.00 - 5.20 10*12/L    HGB 9.6 (L) 12.0 - 16.0 g/dL    HCT 16.1 (L) 09.6 - 46.0 %    MCV 94.7 80.0 - 100.0 fL    MCH 32.1 26.0 - 34.0 pg    MCHC 33.9 31.0 - 37.0 g/dL    RDW 04.5 (H) 40.9 - 15.0 %    MPV 9.3 7.0 - 10.0 fL    Platelet 69 (L) 150 - 440 10*9/L    Variable HGB Concentration Slight (A) Not Present    Neutrophils % 57.5 %    Lymphocytes % 34.7 %    Monocytes % 4.9 %    Eosinophils % 0.4 %    Basophils % 0.3 %    Neutrophil Left Shift 2+ (A) Not Present    Absolute Neutrophils 1.2 (L) 2.0 - 7.5 10*9/L    Absolute Lymphocytes 0.7 (L) 1.5 - 5.0 10*9/L    Absolute Monocytes 0.1 (L) 0.2 - 0.8 10*9/L    Absolute Eosinophils 0.0 0.0 - 0.4 10*9/L    Absolute Basophils 0.0 0.0 - 0.1 10*9/L    Large Unstained Cells 2 0 - 4 %    Macrocytosis Moderate (A) Not Present    Anisocytosis Moderate (A) Not Present    Hypochromasia Slight (A) Not Present   Morphology Review   Result Value Ref Range    Smear Review Comments See Comment (A) Undefined    Polychromasia Slight (A) Not Present    Toxic Granulation Present (A) Not Present

## 2018-01-22 NOTE — Unmapped (Signed)
[id number] accessed and labs collected by Kristine Royal, RN.

## 2018-01-23 NOTE — Unmapped (Signed)
Pt tolerated tx well. Pt amble and exited clinic in no acute distress.

## 2018-01-23 NOTE — Unmapped (Addendum)
[name] comes for C4D1 Obinutuzumab. A&O*4. VSS. Afebrile. Asymptomatic. RSLPOC patent, flushing, +BR. Labs within treatment parameters. Dosing within 10% variance. Pre-medicated. Allopurinol administered. IVF started. Patient comfortable.   @1631  treatment started.  @1745  report to [place], [place]

## 2018-01-30 ENCOUNTER — Ambulatory Visit: Admit: 2018-01-30 | Discharge: 2018-01-31 | Payer: MEDICARE

## 2018-01-30 ENCOUNTER — Other Ambulatory Visit: Admit: 2018-01-30 | Discharge: 2018-01-31 | Payer: MEDICARE

## 2018-01-30 DIAGNOSIS — C9110 Chronic lymphocytic leukemia of B-cell type not having achieved remission: Principal | ICD-10-CM

## 2018-01-30 LAB — COMPREHENSIVE METABOLIC PANEL
ALBUMIN: 3.6 g/dL (ref 3.5–5.0)
ALKALINE PHOSPHATASE: 84 U/L (ref 38–126)
ALT (SGPT): 18 U/L (ref 15–48)
BILIRUBIN TOTAL: 0.6 mg/dL (ref 0.0–1.2)
BLOOD UREA NITROGEN: 15 mg/dL (ref 7–21)
BUN / CREAT RATIO: 16
CALCIUM: 8.8 mg/dL (ref 8.5–10.2)
CHLORIDE: 110 mmol/L — ABNORMAL HIGH (ref 98–107)
CO2: 20 mmol/L — ABNORMAL LOW (ref 22.0–30.0)
CREATININE: 0.92 mg/dL (ref 0.60–1.00)
EGFR CKD-EPI AA FEMALE: 68 mL/min/{1.73_m2} (ref >=60)
EGFR CKD-EPI NON-AA FEMALE: 59 mL/min/{1.73_m2} — ABNORMAL LOW (ref >=60)
GLUCOSE RANDOM: 89 mg/dL (ref 65–179)
POTASSIUM: 4.1 mmol/L (ref 3.5–5.0)
PROTEIN TOTAL: 6 g/dL — ABNORMAL LOW (ref 6.5–8.3)
SODIUM: 139 mmol/L (ref 135–145)

## 2018-01-30 LAB — CBC W/ AUTO DIFF
BASOPHILS RELATIVE PERCENT: 0.5 %
EOSINOPHILS ABSOLUTE COUNT: 0 10*9/L (ref 0.0–0.4)
EOSINOPHILS RELATIVE PERCENT: 0.9 %
HEMATOCRIT: 30.4 % — ABNORMAL LOW (ref 36.0–46.0)
HEMOGLOBIN: 10.1 g/dL — ABNORMAL LOW (ref 12.0–16.0)
LARGE UNSTAINED CELLS: 4 % (ref 0–4)
LYMPHOCYTES ABSOLUTE COUNT: 0.8 10*9/L — ABNORMAL LOW (ref 1.5–5.0)
LYMPHOCYTES RELATIVE PERCENT: 39.1 %
MEAN CORPUSCULAR HEMOGLOBIN: 31.2 pg (ref 26.0–34.0)
MEAN CORPUSCULAR VOLUME: 94 fL (ref 80.0–100.0)
MEAN PLATELET VOLUME: 9.1 fL (ref 7.0–10.0)
MONOCYTES ABSOLUTE COUNT: 0.2 10*9/L (ref 0.2–0.8)
MONOCYTES RELATIVE PERCENT: 12 %
NEUTROPHILS ABSOLUTE COUNT: 0.9 10*9/L — ABNORMAL LOW (ref 2.0–7.5)
NEUTROPHILS RELATIVE PERCENT: 43.4 %
PLATELET COUNT: 70 10*9/L — ABNORMAL LOW (ref 150–440)
RED BLOOD CELL COUNT: 3.23 10*12/L — ABNORMAL LOW (ref 4.00–5.20)
RED CELL DISTRIBUTION WIDTH: 18.9 % — ABNORMAL HIGH (ref 12.0–15.0)
WBC ADJUSTED: 2 10*9/L — ABNORMAL LOW (ref 4.5–11.0)

## 2018-01-30 LAB — PHOSPHORUS: Phosphate:MCnc:Pt:Ser/Plas:Qn:: 3.6

## 2018-01-30 LAB — SLIDE REVIEW

## 2018-01-30 LAB — URIC ACID: Urate:MCnc:Pt:Ser/Plas:Qn:: 4.1

## 2018-01-30 LAB — TOXIC GRANULATION

## 2018-01-30 LAB — MEAN CORPUSCULAR VOLUME: Lab: 94

## 2018-01-30 LAB — ALT (SGPT): Alanine aminotransferase:CCnc:Pt:Ser/Plas:Qn:: 18

## 2018-01-30 NOTE — Unmapped (Unsigned)
Patient is in the infusion center today for lab check. Results were reviewed with them in clinic with no interventions needed. They left the infusion center alert and ambulatory.

## 2018-01-30 NOTE — Unmapped (Signed)
Labs drawn and sent for analysis.  Care provided by  E Ved, RN

## 2018-01-30 NOTE — Unmapped (Signed)
Lab on 01/30/2018   Component Date Value Ref Range Status   ??? Sodium 01/30/2018 139  135 - 145 mmol/L Final   ??? Potassium 01/30/2018 4.1  3.5 - 5.0 mmol/L Final   ??? Chloride 01/30/2018 110* 98 - 107 mmol/L Final   ??? CO2 01/30/2018 20.0* 22.0 - 30.0 mmol/L Final   ??? BUN 01/30/2018 15  7 - 21 mg/dL Final   ??? Creatinine 01/30/2018 0.92  0.60 - 1.00 mg/dL Final   ??? BUN/Creatinine Ratio 01/30/2018 16   Final   ??? EGFR CKD-EPI Non-African American,* 01/30/2018 59* >=60 mL/min/1.15m2 Final   ??? EGFR CKD-EPI African American, Fem* 01/30/2018 68  >=60 mL/min/1.58m2 Final   ??? Glucose 01/30/2018 89  65 - 179 mg/dL Final   ??? Calcium 09/81/1914 8.8  8.5 - 10.2 mg/dL Final   ??? Albumin 78/29/5621 3.6  3.5 - 5.0 g/dL Final   ??? Total Protein 01/30/2018 6.0* 6.5 - 8.3 g/dL Final   ??? Total Bilirubin 01/30/2018 0.6  0.0 - 1.2 mg/dL Final   ??? AST 30/86/5784 25  14 - 38 U/L Final   ??? ALT 01/30/2018 18  15 - 48 U/L Final   ??? Alkaline Phosphatase 01/30/2018 84  38 - 126 U/L Final   ??? Anion Gap 01/30/2018 9  7 - 15 mmol/L Final   ??? Uric Acid 01/30/2018 4.1  3.0 - 6.5 mg/dL Final   ??? Phosphorus 01/30/2018 3.6  2.9 - 4.7 mg/dL Final   ??? WBC 69/62/9528 2.0* 4.5 - 11.0 10*9/L Preliminary   ??? RBC 01/30/2018 3.23* 4.00 - 5.20 10*12/L Preliminary   ??? HGB 01/30/2018 10.1* 12.0 - 16.0 g/dL Preliminary   ??? HCT 01/30/2018 30.4* 36.0 - 46.0 % Preliminary   ??? MCV 01/30/2018 94.0  80.0 - 100.0 fL Preliminary   ??? MCH 01/30/2018 31.2  26.0 - 34.0 pg Preliminary   ??? MCHC 01/30/2018 33.2  31.0 - 37.0 g/dL Preliminary   ??? RDW 01/30/2018 18.9* 12.0 - 15.0 % Preliminary   ??? MPV 01/30/2018 9.1  7.0 - 10.0 fL Preliminary   ??? Platelet 01/30/2018 70* 150 - 440 10*9/L Preliminary   ??? Variable HGB Concentration 01/30/2018 Moderate* Not Present Preliminary   ??? Neutrophil Left Shift 01/30/2018 3+* Not Present Preliminary   ??? Macrocytosis 01/30/2018 Slight* Not Present Preliminary   ??? Anisocytosis 01/30/2018 Moderate* Not Present Preliminary   ??? Hypochromasia 01/30/2018 Moderate* Not Present Preliminary       If you feel like this is an emergency please call 911.  For appointments or questions Monday through Friday 8AM-5PM please call [phone] or Toll Free [phone]. For Medical questions or concerns ask for the Nurse Triage Line.  On Nights, Weekends, and Holidays call [phone] and ask for the Oncologist on Call.  Reasons to call the Nurse Triage Line:  Fever of 100.5 or greater  Nausea and/or vomiting not relived with nausea medicine  Diarrhea or constipation  Severe pain not relieved with usual pain regimen  Shortness of breath  Uncontrolled bleeding  Mental status changes

## 2018-01-31 MED ORDER — VENETOCLAX 100 MG TABLET
Freq: Every day | ORAL | 6 refills | 0 days | Status: CP
Start: 2018-01-31 — End: 2018-05-07
  Filled 2018-02-28: qty 60, 30d supply, fill #0

## 2018-02-04 NOTE — Unmapped (Deleted)
Trauma Summary    Admission Date: 01/16/18     Admitting Attending: Dr. Sallyanne Havers    Discharge Date: 01/19/18    Trauma: yellow trauma from fall out of bed and head hit night stand on 01/16/18.    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

## 2018-02-05 ENCOUNTER — Ambulatory Visit: Admit: 2018-02-05 | Discharge: 2018-02-06 | Payer: MEDICARE | Attending: Surgery | Primary: Surgery

## 2018-02-05 ENCOUNTER — Ambulatory Visit: Admit: 2018-02-05 | Discharge: 2018-02-06 | Payer: MEDICARE | Attending: Adult Health | Primary: Adult Health

## 2018-02-05 ENCOUNTER — Ambulatory Visit: Admit: 2018-02-05 | Discharge: 2018-02-06 | Payer: MEDICARE

## 2018-02-05 ENCOUNTER — Other Ambulatory Visit: Admit: 2018-02-05 | Discharge: 2018-02-06 | Payer: MEDICARE

## 2018-02-05 DIAGNOSIS — C9110 Chronic lymphocytic leukemia of B-cell type not having achieved remission: Secondary | ICD-10-CM

## 2018-02-05 DIAGNOSIS — D701 Agranulocytosis secondary to cancer chemotherapy: Principal | ICD-10-CM

## 2018-02-05 DIAGNOSIS — T451X5A Adverse effect of antineoplastic and immunosuppressive drugs, initial encounter: Secondary | ICD-10-CM

## 2018-02-05 DIAGNOSIS — R197 Diarrhea, unspecified: Secondary | ICD-10-CM

## 2018-02-05 LAB — CBC W/ AUTO DIFF
BASOPHILS ABSOLUTE COUNT: 0 10*9/L (ref 0.0–0.1)
BASOPHILS RELATIVE PERCENT: 0.4 %
EOSINOPHILS ABSOLUTE COUNT: 0 10*9/L (ref 0.0–0.4)
EOSINOPHILS RELATIVE PERCENT: 0.5 %
HEMATOCRIT: 32.8 % — ABNORMAL LOW (ref 36.0–46.0)
HEMOGLOBIN: 10.8 g/dL — ABNORMAL LOW (ref 12.0–16.0)
LARGE UNSTAINED CELLS: 4 % (ref 0–4)
LYMPHOCYTES ABSOLUTE COUNT: 0.7 10*9/L — ABNORMAL LOW (ref 1.5–5.0)
MEAN CORPUSCULAR HEMOGLOBIN CONC: 32.9 g/dL (ref 31.0–37.0)
MEAN CORPUSCULAR HEMOGLOBIN: 31.1 pg (ref 26.0–34.0)
MEAN CORPUSCULAR VOLUME: 94.6 fL (ref 80.0–100.0)
MEAN PLATELET VOLUME: 9.2 fL (ref 7.0–10.0)
MONOCYTES ABSOLUTE COUNT: 0.2 10*9/L (ref 0.2–0.8)
MONOCYTES RELATIVE PERCENT: 11.3 %
NEUTROPHILS ABSOLUTE COUNT: 0.8 10*9/L — ABNORMAL LOW (ref 2.0–7.5)
NEUTROPHILS RELATIVE PERCENT: 43.5 %
PLATELET COUNT: 40 10*9/L — ABNORMAL LOW (ref 150–440)
RED CELL DISTRIBUTION WIDTH: 18.3 % — ABNORMAL HIGH (ref 12.0–15.0)
WBC ADJUSTED: 1.9 10*9/L — ABNORMAL LOW (ref 4.5–11.0)

## 2018-02-05 LAB — COMPREHENSIVE METABOLIC PANEL
ALBUMIN: 3.5 g/dL (ref 3.5–5.0)
ALKALINE PHOSPHATASE: 105 U/L (ref 38–126)
ALT (SGPT): 14 U/L — ABNORMAL LOW (ref 15–48)
ANION GAP: 9 mmol/L (ref 7–15)
AST (SGOT): 29 U/L (ref 14–38)
BILIRUBIN TOTAL: 0.7 mg/dL (ref 0.0–1.2)
BLOOD UREA NITROGEN: 10 mg/dL (ref 7–21)
BUN / CREAT RATIO: 11
CHLORIDE: 109 mmol/L — ABNORMAL HIGH (ref 98–107)
CO2: 20 mmol/L — ABNORMAL LOW (ref 22.0–30.0)
CREATININE: 0.88 mg/dL (ref 0.60–1.00)
EGFR CKD-EPI AA FEMALE: 72 mL/min/{1.73_m2} (ref >=60)
EGFR CKD-EPI NON-AA FEMALE: 63 mL/min/{1.73_m2} (ref >=60)
POTASSIUM: 3.7 mmol/L (ref 3.5–5.0)
PROTEIN TOTAL: 6 g/dL — ABNORMAL LOW (ref 6.5–8.3)
SODIUM: 138 mmol/L (ref 135–145)

## 2018-02-05 LAB — URIC ACID: Urate:MCnc:Pt:Ser/Plas:Qn:: 3.7

## 2018-02-05 LAB — PHOSPHORUS: Phosphate:MCnc:Pt:Ser/Plas:Qn:: 4.1

## 2018-02-05 LAB — ALKALINE PHOSPHATASE: Alkaline phosphatase:CCnc:Pt:Ser/Plas:Qn:: 105

## 2018-02-05 LAB — SMEAR REVIEW

## 2018-02-05 LAB — MACROCYTES

## 2018-02-05 MED ORDER — CHOLESTYRAMINE (WITH SUGAR) 4 GRAM POWDER FOR SUSP IN A PACKET
Freq: Three times a day (TID) | ORAL | 12 refills | 0.00000 days | Status: CP
Start: 2018-02-05 — End: 2018-05-07

## 2018-02-05 NOTE — Unmapped (Signed)
[facility] Follow Up       Patient Name: Katrina Gould  Patient Age: 77 y.o.  Encounter Date: 02/05/2018      Chief complaint/Reason for visit: CLL/SLL    Assessment:  Katrina Gould is a 77 y.o. female who presents for follow of her CLL/SLL, Rai stage 4. Disease markers: 46,XX,der(4)t(4;4)(p14;q21). IGHV unmutated (0%, 3-33*01). B80m 5.05. No TP53 mutation. High-risk by CLL-IPI (6 points).    Katrina Gould is doing well.  She had not had any further falls.  On discussion, it seems that her father had inner ear issues that caused him significant dizziness.  She is seeing ENT next month and I advised her to bring it up with them.  She is doing well on the 200mg  venetoclax, and her labs are stable (though another drop in platelets).  ANC is 0.8 today, so she will get granix in infusion today.    Still with sacral pain 2/2 falls.  Continues to have watery diarrhea.  Will have her start cholestyramine powder to see if that bulks up her stools/slowes her bowels.    Plans and Recommendations:  1. CLL, Rai 4   - Continue Venetoclax 200 mg  - Obinituzumab in 2 weeks as scheduled  - ANC 0.8 today, granix 480 mcg in infusion    2.  Syncope  - discuss inner ear issues w/ENT    3.  Nutrition  - still drinking boost  - can try making protein shakes at home once she has finished this    4. Chemotherapy induced n/v/d  - start cholestyramine powder 1 packet TID with meals  - continue Imodium as needed for loose stools    5. Pain related to falls  - continue tylenol as needed for pain relief  - may use ice/heat for additional comfort    6. Health Maintenance  - flu shot given this season  - continue annual dermatology follow up  - still needs 2nd shingrix vaccine      Due to this patient's diagnosis, she is at significant risk for subsequent morbidity and/or mortality.      Markus Jarvis, AGPCNP-C, MSN, OCN  Nurse Practitioner  Hematologic Malignancies  [facility]  [phone] (phone)  [phone] (fax) [email or web address]        Interval History:  Doing well overall.  No more syncopal episodes, but reports that she still has dizziness. Has an appt with PCP on 10/23.  Reports that this was also an issue with her father.  Still having diarrhea, which is just water. She is taking 4 imodium at a time.    Otherwise, she denies new constitutional symptoms such as anorexia, weight loss, drenching night sweats or unexplained fevers.  Furthermore, she denies symptoms of marrow failure:  recurrent or unexplained intercurrent infections.  There have been no new or unexplained pains or self-identified masses, swelling or enlarged lymph nodes.    Past Medical, Surgical and Family History were reviewed and pertinent updates were made in the Electronic Medical Record  ??  Review of Systems:  Other than as reported above in the interim history, the other systems reviewed were unremarkable.  ??  ECOG Performance Status: 1     Oncology History:    Oncology History    CLL    First seen in oncology clinic locally 03/31/16    Had a left axillary biopsy 03/15/16 that confirmed CLL/SLL: Flow showed monoclonal B-cell population with CD5, CD23, kappa light chain. IHC showed positive CD20 (  diffuse), negative for cyclin D1.    No constitutional symptoms except some sweats    Labs: Hb 12.0, Hct 34.8, plt 147. WBC was 10.6 with ANC 2.5. ALC was 7.5K.    CLL FISH studies: normal panel. No cells with 11q, tri 12, 17p or 13q, and the 11;14 translocation also not detected.    CT c/a/p 04/04/16 showed mild LAD in chest, abd, and pelvis and spleen was at ULN (13 x 5.6 x 10.9 cm). Largest node 1.6 cm.    Of note is that she had a prior CT on 04/06/15 that showed similarly small enlarged node, largest being 1.9 cm.    CLL-IPI: As per Lancet Oncol Vol 15 October 2014  Includes (1) TP53 status (no abnormalities vs. Del 17p and/or TP53 mutation - 4 points) - no 17p (did not send TP53 mutation testing), (2) IGHV mutation status (mut vs unmut - 2 points) (pending) (3) serum B2-microglobulin (</=3.5 vs >3.5 - 2 points) (gets 2 points here) (4) clinical stage (Rai 0 vs Rai I-IV - 1 point) (gets 1 point here) and (5) age (</=65 vs >65 - 1 point)  (gets 1 point here)     Low risk (0-1 points)= 93.2% OS at 5 years  Int risk (2-3 points)= 79.3%  High risk (4-6 points)= 63.3%  - this patient has 4 points already, pending IGHV testing and prior to treatment would also need to send TP53 mutation testing  Very high risk (7-10 points)= 23.3%               CLL (chronic lymphocytic leukemia) (CMS-HCC)    05/16/2016 Initial Diagnosis     CLL (chronic lymphocytic leukemia) (RAF-HCC)        B-cell chronic lymphocytic leukemia (CMS-HCC)    09/05/2017 - 10/11/2017 Chemotherapy     Chemotherapy Treatment    Treatment Goal Control   Line of Treatment [No plan line of treatment]   Plan Name OP RITUXIMAB    Start Date 09/14/2017   End Date 10/05/2017   Provider Pernell Dupre, MD   Chemotherapy riTUXimab (RITUXAN) 712.5 mg in sodium chloride (NS) 0.9 % 500 mL IVPB, 375 mg/m2 = 712.5 mg, Intravenous, Once, 1 of 1 cycle  Administration: 712.5 mg (09/14/2017)  riTUXimab (RITUXAN) 712.5 mg in sodium chloride (NS) 0.9 % 250 mL rapid infusion, 375 mg/m2 = 712.5 mg, Intravenous, Once, 1 of 1 cycle  Administration: 712.5 mg (09/21/2017), 712.5 mg (09/28/2017), 712.5 mg (10/05/2017)         10/16/2017 Initial Diagnosis     Chronic lymphocytic leukemia, Rai stage IV (CMS-HCC)      10/23/2017 -  Chemotherapy     Chemotherapy Treatment    Treatment Goal Control   Line of Treatment [No plan line of treatment]   Plan Name OP OBINUTUZUMAB AND VENETOCLAX   Start Date 10/23/2017   End Date 03/19/2018 (Planned)   Provider Pernell Dupre, MD   Chemotherapy dexamethasone (DECADRON) tablet 20 mg, 20 mg, Oral, Once, 4 of 6 cycles  Administration: 20 mg (10/23/2017), 20 mg (10/24/2017), 20 mg (10/30/2017), 20 mg (11/06/2017), 20 mg (11/20/2017), 20 mg (12/18/2017), 20 mg (01/22/2018)  obinutuzumab 100 mg in sodium chloride (NS) 0.9 % 50 mL IVPB, 100 mg, Intravenous, Once, 4 of 6 cycles  Administration: 100 mg (10/23/2017), 900 mg (10/24/2017), 1,000 mg (10/30/2017), 1,000 mg (11/06/2017), 1,000 mg (11/20/2017), 1,000 mg (12/18/2017), 1,000 mg (01/22/2018)          Social history:  Here with son  Lives alone  Husband died in 2010    Allergies   Allergen Reactions   ??? Sertraline Other (See Comments)     Nervousness, panic, shortness of breath   ??? Topiramate Palpitations     Panic attacks; unable to sleep   ??? Baclofen      made me go crazy         Current Outpatient Medications   Medication Sig Dispense Refill   ??? allopurinol (ZYLOPRIM) 300 MG tablet Take 1 tablet (300 mg total) by mouth daily. 30 tablet 2   ??? cholestyramine (QUESTRAN) 4 gram packet Take 1 packet by mouth Three (3) times a day with a meal. 90 each 12   ??? fesoterodine (TOVIAZ) 4 mg 24 hr tablet Take by mouth Take as directed.      ??? fluticasone propion-salmeterol (ADVAIR) 100-50 mcg/dose diskus Inhale.     ??? LORazepam (ATIVAN) 0.5 MG tablet Take 0.5 mg by mouth two (2) times a day as needed.      ??? mirtazapine (REMERON) 7.5 MG tablet Take 7.5 mg by mouth nightly.     ??? multivitamin (TAB-A-VITE/THERAGRAN) per tablet Take 1 tablet by mouth daily.      ??? oxyCODONE (ROXICODONE) 5 MG immediate release tablet Take 1 tablet (5 mg total) by mouth every eight (8) hours as needed for pain. 20 tablet 0   ??? pantoprazole (PROTONIX) 40 MG tablet Take by mouth daily at 0600.      ??? pravastatin (PRAVACHOL) 80 MG tablet TAKE 1 TABLET BY MOUTH EVERY NIGHT AT BEDTIME     ??? predniSONE (DELTASONE) 10 MG tablet 6 TABS X 1 DAY, 5 TABS X 1 DAY, 4 TABS X 1 DAY, ETC...  0   ??? ranitidine (ZANTAC) 150 MG capsule Take 1 capsule (150 mg total) by mouth Two (2) times a day. 180 capsule 3   ??? sertraline (ZOLOFT) 50 MG tablet Take 50 mg by mouth daily.  1   ??? topiramate (TOPAMAX) 50 MG tablet Take 50 mg by mouth daily.  3   ??? venetoclax (VENCLEXTA) 100 mg tablet Take 2 tablets (200 mg total) by mouth daily. 60 each 6     No current facility-administered medications for this visit.      Physical exam:  Vitals:    02/05/18 1216   BP: 130/60   Pulse: 78   Resp: 18   Temp: 36.4 ??C (97.6 ??F)   SpO2: 97%     Constitutional: Resting, in no apparent distress  Eyes: PERRL. No scleral icterus or conjunctival injection.  Ear/nose/mouth/throat: Oral mucosa without ulceration, erythema or exudate.   Hematology/lymphatic/immunologic:  No lymphadenopathy in the anterior/posterior cervical, supraclavicular basins.  Cardiovascular:  RRR.  S1, S2.  No murmurs, gallops or rubs. Appear well-perfused.  No clubbing or cyanosis. 1+ edema to bilateral ankles and feet.  Respiratory:  Breathing is unlabored, and patient is speaking full sentences with ease.  No stridor.  CTAB. No rales, ronchi or crackles.    GI:  No distention. Bowel sounds are present and normal in quality.  Mild pain on palpation of right upper quadrant. No palpable hepatomegaly or splenomegaly.  No palpable masses.   Musculoskeletal: . No pain on palpation of the spinous processes of the cervical, thoracic or lumbar vertebral bodies. No grossly-evident joint effusions or deformities.  Range of motion about the shoulder, elbow, hips and knees is grossly normal.    Skin:  No rashes or areas of skin breakdown.  Warm to touch,  dry, smooth and even.  Neurologic:  Gait is normal.  Cerebellar tasks are completed with ease and are symmetric.  Psychiatric:  Alert and oriented to person, place, time and situation.  Range of affect is appropriate.      ECOG Performance Status: 1    Results:  Labs and pathology have been reviewed and pertinent results are as follows:    Results for orders placed or performed in visit on 02/05/18   Comprehensive Metabolic Panel   Result Value Ref Range    Sodium 138 135 - 145 mmol/L    Potassium 3.7 3.5 - 5.0 mmol/L    Chloride 109 (H) 98 - 107 mmol/L    CO2 20.0 (L) 22.0 - 30.0 mmol/L    BUN 10 7 - 21 mg/dL    Creatinine 1.61 0.96 - 1.00 mg/dL    BUN/Creatinine Ratio 11     EGFR CKD-EPI Non-African American, Female 63 >=60 mL/min/1.43m2    EGFR CKD-EPI African American, Female 72 >=60 mL/min/1.70m2    Glucose 107 65 - 179 mg/dL    Calcium 9.0 8.5 - 04.5 mg/dL    Albumin 3.5 3.5 - 5.0 g/dL    Total Protein 6.0 (L) 6.5 - 8.3 g/dL    Total Bilirubin 0.7 0.0 - 1.2 mg/dL    AST 29 14 - 38 U/L    ALT 14 (L) 15 - 48 U/L    Alkaline Phosphatase 105 38 - 126 U/L    Anion Gap 9 7 - 15 mmol/L   Uric acid   Result Value Ref Range    Uric Acid 3.7 3.0 - 6.5 mg/dL   Phosphorus Level   Result Value Ref Range    Phosphorus 4.1 2.9 - 4.7 mg/dL   CBC w/ Differential   Result Value Ref Range    WBC 1.9 (L) 4.5 - 11.0 10*9/L    RBC 3.47 (L) 4.00 - 5.20 10*12/L    HGB 10.8 (L) 12.0 - 16.0 g/dL    HCT 40.9 (L) 81.1 - 46.0 %    MCV 94.6 80.0 - 100.0 fL    MCH 31.1 26.0 - 34.0 pg    MCHC 32.9 31.0 - 37.0 g/dL    RDW 91.4 (H) 78.2 - 15.0 %    MPV 9.2 7.0 - 10.0 fL    Platelet 40 (L) 150 - 440 10*9/L    Variable HGB Concentration Moderate (A) Not Present    Neutrophils % 43.5 %    Lymphocytes % 40.3 %    Monocytes % 11.3 %    Eosinophils % 0.5 %    Basophils % 0.4 %    Neutrophil Left Shift 1+ (A) Not Present    Absolute Neutrophils 0.8 (L) 2.0 - 7.5 10*9/L    Absolute Lymphocytes 0.7 (L) 1.5 - 5.0 10*9/L    Absolute Monocytes 0.2 0.2 - 0.8 10*9/L    Absolute Eosinophils 0.0 0.0 - 0.4 10*9/L    Absolute Basophils 0.0 0.0 - 0.1 10*9/L    Large Unstained Cells 4 0 - 4 %    Macrocytosis Slight (A) Not Present    Anisocytosis Moderate (A) Not Present    Hypochromasia Marked (A) Not Present   Morphology Review   Result Value Ref Range    Smear Review Comments See Comment (A) Undefined

## 2018-02-05 NOTE — Unmapped (Signed)
If you feel like this is an emergency please call 911.  For appointments or questions Monday through Friday 8AM-5PM please call [phone] or Toll Free [phone]. For Medical questions or concerns ask for the Nurse Triage Line.  On Nights, Weekends, and Holidays call [phone] and ask for the Oncologist on Call.  Reasons to call the Nurse Triage Line:  Fever of 100.5 or greater  Nausea and/or vomiting not relived with nausea medicine  Diarrhea or constipation  Severe pain not relieved with usual pain regimen  Shortness of breath  Uncontrolled bleeding  Mental status changes

## 2018-02-05 NOTE — Unmapped (Signed)
Reviewed lab results with patient, no additional concerns. Granix administered, patient ambulatory at the time of discharge.

## 2018-02-05 NOTE — Unmapped (Signed)
1155: labs drawn from [facility]. Care provided by Wynetta Fines, RN.

## 2018-02-14 ENCOUNTER — Ambulatory Visit: Admit: 2018-02-14 | Discharge: 2018-02-15 | Payer: MEDICARE

## 2018-02-14 ENCOUNTER — Other Ambulatory Visit: Admit: 2018-02-14 | Discharge: 2018-02-15 | Payer: MEDICARE

## 2018-02-14 DIAGNOSIS — C9110 Chronic lymphocytic leukemia of B-cell type not having achieved remission: Principal | ICD-10-CM

## 2018-02-14 LAB — COMPREHENSIVE METABOLIC PANEL
ALBUMIN: 3.8 g/dL (ref 3.5–5.0)
ALKALINE PHOSPHATASE: 93 U/L (ref 38–126)
ALT (SGPT): 12 U/L — ABNORMAL LOW (ref 15–48)
ANION GAP: 7 mmol/L (ref 7–15)
AST (SGOT): 28 U/L (ref 14–38)
BILIRUBIN TOTAL: 0.5 mg/dL (ref 0.0–1.2)
BLOOD UREA NITROGEN: 18 mg/dL (ref 7–21)
BUN / CREAT RATIO: 20
CALCIUM: 9.3 mg/dL (ref 8.5–10.2)
CHLORIDE: 111 mmol/L — ABNORMAL HIGH (ref 98–107)
CO2: 21 mmol/L — ABNORMAL LOW (ref 22.0–30.0)
CREATININE: 0.89 mg/dL (ref 0.60–1.00)
EGFR CKD-EPI NON-AA FEMALE: 62 mL/min/{1.73_m2} (ref >=60)
GLUCOSE RANDOM: 103 mg/dL (ref 65–179)
POTASSIUM: 3.6 mmol/L (ref 3.5–5.0)
PROTEIN TOTAL: 6.3 g/dL — ABNORMAL LOW (ref 6.5–8.3)
SODIUM: 139 mmol/L (ref 135–145)

## 2018-02-14 LAB — CBC W/ AUTO DIFF
HEMATOCRIT: 34.6 % — ABNORMAL LOW (ref 36.0–46.0)
MEAN CORPUSCULAR HEMOGLOBIN CONC: 32 g/dL (ref 31.0–37.0)
MEAN CORPUSCULAR HEMOGLOBIN: 30.3 pg (ref 26.0–34.0)
MEAN CORPUSCULAR VOLUME: 94.7 fL (ref 80.0–100.0)
MEAN PLATELET VOLUME: 9.5 fL (ref 7.0–10.0)
PLATELET COUNT: 63 10*9/L — ABNORMAL LOW (ref 150–440)
RED CELL DISTRIBUTION WIDTH: 18.2 % — ABNORMAL HIGH (ref 12.0–15.0)
WBC ADJUSTED: 2.8 10*9/L — ABNORMAL LOW (ref 4.5–11.0)

## 2018-02-14 LAB — MANUAL DIFFERENTIAL
BASOPHILS - ABS (DIFF): 0 10*9/L (ref 0.0–0.1)
BASOPHILS - REL (DIFF): 0 %
EOSINOPHILS - ABS (DIFF): 0 10*9/L (ref 0.0–0.4)
EOSINOPHILS - REL (DIFF): 1 %
LYMPHOCYTES - ABS (DIFF): 0.9 10*9/L — ABNORMAL LOW (ref 1.5–5.0)
MONOCYTES - ABS (DIFF): 0.1 10*9/L — ABNORMAL LOW (ref 0.2–0.8)
MONOCYTES - REL (DIFF): 5 %
NEUTROPHILS - ABS (DIFF): 1.7 10*9/L — ABNORMAL LOW (ref 2.0–7.5)
NEUTROPHILS - REL (DIFF): 61 %

## 2018-02-14 LAB — SODIUM: Sodium:SCnc:Pt:Ser/Plas:Qn:: 139

## 2018-02-14 LAB — PHOSPHORUS: Phosphate:MCnc:Pt:Ser/Plas:Qn:: 3.9

## 2018-02-14 LAB — URIC ACID: Urate:MCnc:Pt:Ser/Plas:Qn:: 3.7

## 2018-02-14 LAB — LACTATE DEHYDROGENASE: Lactate dehydrogenase:CCnc:Pt:Ser/Plas:Qn:: 821 — ABNORMAL HIGH

## 2018-02-14 LAB — LYMPHOCYTES - REL (DIFF): Lab: 33

## 2018-02-14 LAB — MEAN CORPUSCULAR HEMOGLOBIN CONC: Lab: 32

## 2018-02-14 NOTE — Unmapped (Signed)
If you feel like this is an emergency please call 911.  For appointments or questions Monday through Friday 8AM-5PM please call [phone] or Toll Free [phone]. For Medical questions or concerns ask for the Nurse Triage Line.  On Nights, Weekends, and Holidays call [phone] and ask for the Oncologist on Call.  Reasons to call the Nurse Triage Line:  Fever of 100.5 or greater  Nausea and/or vomiting not relived with nausea medicine  Diarrhea or constipation  Severe pain not relieved with usual pain regimen  Shortness of breath  Uncontrolled bleeding  Mental status changes    Lab on 02/14/2018   Component Date Value Ref Range Status   ??? Sodium 02/14/2018 139  135 - 145 mmol/L Final   ??? Potassium 02/14/2018 3.6  3.5 - 5.0 mmol/L Final   ??? Chloride 02/14/2018 111* 98 - 107 mmol/L Final   ??? CO2 02/14/2018 21.0* 22.0 - 30.0 mmol/L Final   ??? BUN 02/14/2018 18  7 - 21 mg/dL Final   ??? Creatinine 02/14/2018 0.89  0.60 - 1.00 mg/dL Final   ??? BUN/Creatinine Ratio 02/14/2018 20   Final   ??? EGFR CKD-EPI Non-African American,* 02/14/2018 62  >=60 mL/min/1.34m2 Final   ??? EGFR CKD-EPI African American, Fem* 02/14/2018 71  >=60 mL/min/1.45m2 Final   ??? Glucose 02/14/2018 103  65 - 179 mg/dL Final   ??? Calcium 95/28/4132 9.3  8.5 - 10.2 mg/dL Final   ??? Albumin 44/05/270 3.8  3.5 - 5.0 g/dL Final   ??? Total Protein 02/14/2018 6.3* 6.5 - 8.3 g/dL Final   ??? Total Bilirubin 02/14/2018 0.5  0.0 - 1.2 mg/dL Final   ??? AST 53/66/4403 28  14 - 38 U/L Final   ??? ALT 02/14/2018 12* 15 - 48 U/L Final   ??? Alkaline Phosphatase 02/14/2018 93  38 - 126 U/L Final   ??? Anion Gap 02/14/2018 7  7 - 15 mmol/L Final   ??? Uric Acid 02/14/2018 3.7  3.0 - 6.5 mg/dL Final   ??? LDH 02/14/2018 821* 338 - 610 U/L Final   ??? Phosphorus 02/14/2018 3.9  2.9 - 4.7 mg/dL Final   ??? WBC 47/42/5956 2.8* 4.5 - 11.0 10*9/L Final   ??? RBC 02/14/2018 3.66* 4.00 - 5.20 10*12/L Final   ??? HGB 02/14/2018 11.1* 12.0 - 16.0 g/dL Final   ??? HCT 38/75/6433 34.6* 36.0 - 46.0 % Final   ??? MCV 02/14/2018 94.7  80.0 - 100.0 fL Final   ??? MCH 02/14/2018 30.3  26.0 - 34.0 pg Final   ??? MCHC 02/14/2018 32.0  31.0 - 37.0 g/dL Final   ??? RDW 29/51/8841 18.2* 12.0 - 15.0 % Final   ??? MPV 02/14/2018 9.5  7.0 - 10.0 fL Final   ??? Platelet 02/14/2018 63* 150 - 440 10*9/L Final   ??? Variable HGB Concentration 02/14/2018 Moderate* Not Present Final   ??? Neutrophil Left Shift 02/14/2018 3+* Not Present Final   ??? Macrocytosis 02/14/2018 Slight* Not Present Final   ??? Anisocytosis 02/14/2018 Moderate* Not Present Final   ??? Hypochromasia 02/14/2018 Marked* Not Present Final   ??? Neutrophils % 02/14/2018 61  % Final   ??? Lymphocytes % 02/14/2018 33  % Final   ??? Monocytes % 02/14/2018 5  % Final   ??? Eosinophils % 02/14/2018 1  % Final   ??? Basophils % 02/14/2018 0  % Final   ??? Absolute Neutrophils 02/14/2018 1.7* 2.0 - 7.5 10*9/L Final   ??? Absolute Lymphocytes 02/14/2018 0.9* 1.5 -  5.0 10*9/L Final   ??? Absolute Monocytes 02/14/2018 0.1* 0.2 - 0.8 10*9/L Final   ??? Absolute Eosinophils 02/14/2018 0.0  0.0 - 0.4 10*9/L Final   ??? Absolute Basophils 02/14/2018 0.0  0.0 - 0.1 10*9/L Final   ??? Smear Review Comments 02/14/2018 See Comment* Undefined Final    Slide reviewed    ??? Toxic Granulation 02/14/2018 Present* Not Present Final

## 2018-02-14 NOTE — Unmapped (Signed)
Labs drawn and sent for analysis.  Care provided by  Wynetta Fines, RN

## 2018-02-15 NOTE — Unmapped (Signed)
Pt A&O x4, NAD.  Pt denies any complaints since last visit.  Resting comfortably.    Labs outside parameters for transfusion.  Per Markus Jarvis, pt does not need granix injection with ANC 1.7.    No questions or concerns voiced at d/c.  Port deaccessed per protocol.  Pt left infusion center NAD, ambulatory.

## 2018-02-19 ENCOUNTER — Ambulatory Visit: Admit: 2018-02-19 | Discharge: 2018-02-19 | Payer: MEDICARE

## 2018-02-19 ENCOUNTER — Ambulatory Visit: Admit: 2018-02-19 | Discharge: 2018-02-19 | Payer: MEDICARE | Attending: Adult Health | Primary: Adult Health

## 2018-02-19 ENCOUNTER — Other Ambulatory Visit: Admit: 2018-02-19 | Discharge: 2018-02-19 | Payer: MEDICARE

## 2018-02-19 DIAGNOSIS — C9110 Chronic lymphocytic leukemia of B-cell type not having achieved remission: Principal | ICD-10-CM

## 2018-02-19 DIAGNOSIS — K219 Gastro-esophageal reflux disease without esophagitis: Secondary | ICD-10-CM

## 2018-02-19 DIAGNOSIS — D591 Other autoimmune hemolytic anemias: Secondary | ICD-10-CM

## 2018-02-19 DIAGNOSIS — T8189XA Other complications of procedures, not elsewhere classified, initial encounter: Secondary | ICD-10-CM

## 2018-02-19 DIAGNOSIS — R197 Diarrhea, unspecified: Secondary | ICD-10-CM

## 2018-02-19 DIAGNOSIS — Z189 Retained foreign body fragments, unspecified material: Secondary | ICD-10-CM

## 2018-02-19 LAB — COMPREHENSIVE METABOLIC PANEL
ALBUMIN: 4 g/dL (ref 3.5–5.0)
ALKALINE PHOSPHATASE: 88 U/L (ref 38–126)
ALT (SGPT): 15 U/L (ref 15–48)
ANION GAP: 11 mmol/L (ref 7–15)
AST (SGOT): 25 U/L (ref 14–38)
BILIRUBIN TOTAL: 0.6 mg/dL (ref 0.0–1.2)
BLOOD UREA NITROGEN: 20 mg/dL (ref 7–21)
BUN / CREAT RATIO: 19
CALCIUM: 9.4 mg/dL (ref 8.5–10.2)
CHLORIDE: 107 mmol/L (ref 98–107)
CO2: 22 mmol/L (ref 22.0–30.0)
CREATININE: 1.07 mg/dL — ABNORMAL HIGH (ref 0.60–1.00)
EGFR CKD-EPI AA FEMALE: 57 mL/min/{1.73_m2} — ABNORMAL LOW (ref >=60)
EGFR CKD-EPI NON-AA FEMALE: 49 mL/min/{1.73_m2} — ABNORMAL LOW (ref >=60)
GLUCOSE RANDOM: 102 mg/dL (ref 65–179)
POTASSIUM: 3.6 mmol/L (ref 3.5–5.0)
SODIUM: 140 mmol/L (ref 135–145)

## 2018-02-19 LAB — ANION GAP: Anion gap 3:SCnc:Pt:Ser/Plas:Qn:: 11

## 2018-02-19 LAB — CBC W/ AUTO DIFF
BASOPHILS ABSOLUTE COUNT: 0 10*9/L (ref 0.0–0.1)
BASOPHILS RELATIVE PERCENT: 0.5 %
EOSINOPHILS ABSOLUTE COUNT: 0 10*9/L (ref 0.0–0.4)
EOSINOPHILS RELATIVE PERCENT: 0.4 %
HEMATOCRIT: 39.3 % (ref 36.0–46.0)
HEMOGLOBIN: 12.6 g/dL (ref 12.0–16.0)
LARGE UNSTAINED CELLS: 3 % (ref 0–4)
LYMPHOCYTES ABSOLUTE COUNT: 1 10*9/L — ABNORMAL LOW (ref 1.5–5.0)
MEAN CORPUSCULAR HEMOGLOBIN CONC: 32 g/dL (ref 31.0–37.0)
MEAN CORPUSCULAR HEMOGLOBIN: 30.2 pg (ref 26.0–34.0)
MEAN CORPUSCULAR VOLUME: 94.3 fL (ref 80.0–100.0)
MEAN PLATELET VOLUME: 9.7 fL (ref 7.0–10.0)
MONOCYTES ABSOLUTE COUNT: 0.2 10*9/L (ref 0.2–0.8)
MONOCYTES RELATIVE PERCENT: 9 %
NEUTROPHILS ABSOLUTE COUNT: 1.4 10*9/L — ABNORMAL LOW (ref 2.0–7.5)
NEUTROPHILS RELATIVE PERCENT: 51.5 %
PLATELET COUNT: 56 10*9/L — ABNORMAL LOW (ref 150–440)
WBC ADJUSTED: 2.7 10*9/L — ABNORMAL LOW (ref 4.5–11.0)

## 2018-02-19 LAB — SMEAR REVIEW

## 2018-02-19 LAB — ANISOCYTOSIS

## 2018-02-19 NOTE — Unmapped (Signed)
Labs drawn and sent.  Care provided by [facility], RN.

## 2018-02-19 NOTE — Unmapped (Signed)
Labs found to be within parameters for treatment today, spoke to B Sawchak NP re: plt ct [id number] to treat, clarification entered by NP. No granix injection for patient today.  Request for drug sent to pharmacy.;e

## 2018-02-19 NOTE — Unmapped (Signed)
[facility] Follow Up       Patient Name: Katrina Gould  Patient Age: 77 y.o.  Encounter Date: 02/19/2018      Chief complaint/Reason for visit: CLL/SLL    Assessment:  Katrina Gould is a 77 y.o. female who presents for follow of her CLL/SLL, Rai stage 4. Disease markers: 46,XX,der(4)t(4;4)(p14;q21). IGHV unmutated (0%, 3-33*01). B79m 5.05. No TP53 mutation. High-risk by CLL-IPI (6 points).    Katrina Gould is doing fair.  She continues to have diarrhea that is not responsive to PRN imodium and cholestyramine.  Will do C. Diff and GI panel. She will stop her cholestyramine and start metamucil along with 2 tabs imodium with every stool (pending c diff results).   If this does not help, will refer out to GI - this is a longstanding issue that predates her obi/ven.       Will RTC in 1 week for labs and possible granix.  Her ANC tends to fluctuate, and would like to have her ANC be stable for several weeks without granix before stopping weekly checks.    She has a suture still leftover in left temple.  These were initially removed by clinic staff several weeks ago, but seems to have a retained suture.  Attempted to remove it, but unable to without causing damage to skin.  Will refer to plastics to see if they can remove it.      Plans and Recommendations:  1. CLL, Rai 4   - Continue Venetoclax 200 mg  - Obinituzumab today  - ANC 1.4 today, no granix needed today  - lab check with possible granix weekly     2.  Syncope  - follow up with PCP    3. Diarrhea  - continue Imodium as needed for loose stools  - trial metamucil for bulking up stools  - GI pathogen and c diff assay today    4. Pain related to falls  - continue tylenol as needed for pain relief  - may use ice/heat for additional comfort    5. Retained suture  - refer to plastics    6. Health Maintenance  - flu shot given this season  - continue annual dermatology follow up  - still needs 2nd shingrix vaccine      Due to this patient's diagnosis, she is at significant risk for subsequent morbidity and/or mortality.      Markus Jarvis, AGPCNP-C, MSN, OCN  Nurse Practitioner  Hematologic Malignancies  [facility]  [phone] (phone)  [phone] (fax)  [email or web address]        Interval History:  Doing fair overall.  Reports that the cholestyramine did not help her at all.  Continues to have watery diarrhea.  This is longstanding.  Some weakness and occasional heartburn, but does not take anything for the heartburn. Has an appt with PCP on 10/23.     Otherwise, she denies new constitutional symptoms such as anorexia, weight loss, drenching night sweats or unexplained fevers.  Furthermore, she denies symptoms of marrow failure:  recurrent or unexplained intercurrent infections.  There have been no new or unexplained pains or self-identified masses, swelling or enlarged lymph nodes.    Past Medical, Surgical and Family History were reviewed and pertinent updates were made in the Electronic Medical Record  ??  Review of Systems:  Other than as reported above in the interim history, the other systems reviewed were unremarkable.  ??  ECOG Performance Status: 1  Oncology History:    Oncology History    CLL    First seen in oncology clinic locally 03/31/16    Had a left axillary biopsy 03/15/16 that confirmed CLL/SLL: Flow showed monoclonal B-cell population with CD5, CD23, kappa light chain. IHC showed positive CD20 (diffuse), negative for cyclin D1.    No constitutional symptoms except some sweats    Labs: Hb 12.0, Hct 34.8, plt 147. WBC was 10.6 with ANC 2.5. ALC was 7.5K.    CLL FISH studies: normal panel. No cells with 11q, tri 12, 17p or 13q, and the 11;14 translocation also not detected.    CT c/a/p 04/04/16 showed mild LAD in chest, abd, and pelvis and spleen was at ULN (13 x 5.6 x 10.9 cm). Largest node 1.6 cm.    Of note is that she had a prior CT on 04/06/15 that showed similarly small enlarged node, largest being 1.9 cm.    CLL-IPI: As per Lancet Oncol Vol 15 October 2014  Includes (1) TP53 status (no abnormalities vs. Del 17p and/or TP53 mutation - 4 points) - no 17p (did not send TP53 mutation testing), (2) IGHV mutation status (mut vs unmut - 2 points) (pending) (3) serum B2-microglobulin (</=3.5 vs >3.5 - 2 points) (gets 2 points here) (4) clinical stage (Rai 0 vs Rai I-IV - 1 point) (gets 1 point here) and (5) age (</=65 vs >65 - 1 point)  (gets 1 point here)     Low risk (0-1 points)= 93.2% OS at 5 years  Int risk (2-3 points)= 79.3%  High risk (4-6 points)= 63.3%  - this patient has 4 points already, pending IGHV testing and prior to treatment would also need to send TP53 mutation testing  Very high risk (7-10 points)= 23.3%               CLL (chronic lymphocytic leukemia) (CMS-HCC)    05/16/2016 Initial Diagnosis     CLL (chronic lymphocytic leukemia) (RAF-HCC)        B-cell chronic lymphocytic leukemia (CMS-HCC)    09/05/2017 - 10/11/2017 Chemotherapy     Chemotherapy Treatment    Treatment Goal Control   Line of Treatment [No plan line of treatment]   Plan Name OP RITUXIMAB    Start Date 09/14/2017   End Date 10/05/2017   Provider Pernell Dupre, MD   Chemotherapy riTUXimab (RITUXAN) 712.5 mg in sodium chloride (NS) 0.9 % 500 mL IVPB, 375 mg/m2 = 712.5 mg, Intravenous, Once, 1 of 1 cycle  Administration: 712.5 mg (09/14/2017)  riTUXimab (RITUXAN) 712.5 mg in sodium chloride (NS) 0.9 % 250 mL rapid infusion, 375 mg/m2 = 712.5 mg, Intravenous, Once, 1 of 1 cycle  Administration: 712.5 mg (09/21/2017), 712.5 mg (09/28/2017), 712.5 mg (10/05/2017)         10/16/2017 Initial Diagnosis     Chronic lymphocytic leukemia, Rai stage IV (CMS-HCC)      10/23/2017 -  Chemotherapy     OP OBINUTUZUMAB AND VENETOCLAX  chlorambucil 0.5 mg/kg PO on days 1, 15, obinutuzumab 100 mg IV on day 1, obinutuzumab 900 mg IV on day 2, obinutuzumab 1,000 mg IV on days 8,15 on Cycle 1, then obinutuzumab 1,000 mg IV on day 1 for sequent cycles, every 28 days.       Social history:  Here with son  Lives alone  Husband died in October 22, 2008    Allergies   Allergen Reactions   ??? Sertraline Other (See Comments)     Nervousness, panic, shortness of  breath   ??? Topiramate Palpitations     Panic attacks; unable to sleep   ??? Baclofen      made me go crazy         Current Outpatient Medications   Medication Sig Dispense Refill   ??? allopurinol (ZYLOPRIM) 300 MG tablet Take 1 tablet (300 mg total) by mouth daily. 30 tablet 2   ??? fesoterodine (TOVIAZ) 4 mg 24 hr tablet Take by mouth Take as directed.      ??? fluticasone propion-salmeterol (ADVAIR) 100-50 mcg/dose diskus Inhale.     ??? loperamide (IMODIUM A-D) 2 mg tablet Take 2 mg by mouth Three (3) times a day as needed for diarrhea.     ??? LORazepam (ATIVAN) 0.5 MG tablet Take 0.5 mg by mouth two (2) times a day as needed.      ??? mirtazapine (REMERON) 7.5 MG tablet Take 7.5 mg by mouth nightly.     ??? multivitamin (TAB-A-VITE/THERAGRAN) per tablet Take 1 tablet by mouth daily.      ??? pantoprazole (PROTONIX) 40 MG tablet Take by mouth daily at 0600.      ??? pravastatin (PRAVACHOL) 80 MG tablet TAKE 1 TABLET BY MOUTH EVERY NIGHT AT BEDTIME     ??? ranitidine (ZANTAC) 150 MG capsule Take 1 capsule (150 mg total) by mouth Two (2) times a day. 180 capsule 3   ??? topiramate (TOPAMAX) 50 MG tablet Take 50 mg by mouth daily.  3   ??? venetoclax (VENCLEXTA) 100 mg tablet Take 2 tablets (200 mg total) by mouth daily. 60 each 6   ??? cholestyramine (QUESTRAN) 4 gram packet Take 1 packet by mouth Three (3) times a day with a meal. (Patient not taking: Reported on 02/19/2018) 90 each 12   ??? oxyCODONE (ROXICODONE) 5 MG immediate release tablet Take 1 tablet (5 mg total) by mouth every eight (8) hours as needed for pain. 20 tablet 0   ??? predniSONE (DELTASONE) 10 MG tablet 6 TABS X 1 DAY, 5 TABS X 1 DAY, 4 TABS X 1 DAY, ETC...  0   ??? sertraline (ZOLOFT) 50 MG tablet Take 50 mg by mouth daily.  1     No current facility-administered medications for this visit.      Physical exam: Vitals:    02/19/18 1059   BP: 150/80   Pulse: 85   Resp: 18   Temp: 36.8 ??C (98.3 ??F)   SpO2: 98%     Constitutional: Resting, in no apparent distress  Eyes:  No scleral icterus or conjunctival injection.  Ear/nose/mouth/throat: Oral mucosa without ulceration, erythema or exudate.   Hematology/lymphatic/immunologic:  No lymphadenopathy in the anterior/posterior cervical, supraclavicular basins.  Cardiovascular:  RRR.  S1, S2.  No murmurs, gallops or rubs. Appear well-perfused.  No clubbing or cyanosis. 1+ edema to bilateral ankles and feet.  Respiratory:  Breathing is unlabored, and patient is speaking full sentences with ease.  No stridor.  CTAB. No rales, ronchi or crackles.    GI:  No distention. Bowel sounds are present and normal in quality.  Mild pain on palpation of right upper quadrant. No palpable hepatomegaly or splenomegaly.  No palpable masses.   Musculoskeletal: . No pain on palpation of the spinous processes of the cervical, thoracic or lumbar vertebral bodies. No grossly-evident joint effusions or deformities.  Range of motion about the shoulder, elbow, hips and knees is grossly normal.    Skin:  No rashes or areas of skin breakdown.  Warm to touch,  dry, smooth and even.  Neurologic:  Gait is normal.  Cerebellar tasks are completed with ease and are symmetric.  Psychiatric:  Alert and oriented to person, place, time and situation.  Range of affect is appropriate.      ECOG Performance Status: 1    Results:  Labs and pathology have been reviewed and pertinent results are as follows:    Results for orders placed or performed in visit on 02/19/18   Comprehensive Metabolic Panel   Result Value Ref Range    Sodium 140 135 - 145 mmol/L    Potassium 3.6 3.5 - 5.0 mmol/L    Chloride 107 98 - 107 mmol/L    CO2 22.0 22.0 - 30.0 mmol/L    BUN 20 7 - 21 mg/dL    Creatinine 1.61 (H) 0.60 - 1.00 mg/dL    BUN/Creatinine Ratio 19     EGFR CKD-EPI Non-African American, Female 49 (L) >=60 mL/min/1.71m2    EGFR CKD-EPI African American, Female 57 (L) >=60 mL/min/1.43m2    Glucose 102 65 - 179 mg/dL    Calcium 9.4 8.5 - 09.6 mg/dL    Albumin 4.0 3.5 - 5.0 g/dL    Total Protein 6.7 6.5 - 8.3 g/dL    Total Bilirubin 0.6 0.0 - 1.2 mg/dL    AST 25 14 - 38 U/L    ALT 15 15 - 48 U/L    Alkaline Phosphatase 88 38 - 126 U/L    Anion Gap 11 7 - 15 mmol/L   CBC w/ Differential   Result Value Ref Range    WBC 2.7 (L) 4.5 - 11.0 10*9/L    RBC 4.16 4.00 - 5.20 10*12/L    HGB 12.6 12.0 - 16.0 g/dL    HCT 04.5 40.9 - 81.1 %    MCV 94.3 80.0 - 100.0 fL    MCH 30.2 26.0 - 34.0 pg    MCHC 32.0 31.0 - 37.0 g/dL    RDW 91.4 (H) 78.2 - 15.0 %    MPV 9.7 7.0 - 10.0 fL    Platelet 56 (L) 150 - 440 10*9/L    Variable HGB Concentration Moderate (A) Not Present    Neutrophils % 51.5 %    Lymphocytes % 35.5 %    Monocytes % 9.0 %    Eosinophils % 0.4 %    Basophils % 0.5 %    Neutrophil Left Shift 2+ (A) Not Present    Absolute Neutrophils 1.4 (L) 2.0 - 7.5 10*9/L    Absolute Lymphocytes 1.0 (L) 1.5 - 5.0 10*9/L    Absolute Monocytes 0.2 0.2 - 0.8 10*9/L    Absolute Eosinophils 0.0 0.0 - 0.4 10*9/L    Absolute Basophils 0.0 0.0 - 0.1 10*9/L    Large Unstained Cells 3 0 - 4 %    Macrocytosis Slight (A) Not Present    Anisocytosis Slight (A) Not Present    Hypochromasia Marked (A) Not Present   Morphology Review   Result Value Ref Range    Smear Review Comments See Comment (A) Undefined

## 2018-02-20 NOTE — Unmapped (Signed)
Pt tolerated tx well. Pt amble and exited clinic in no acute distress. Per Markus Jarvis, pt to not receive Granix today.

## 2018-02-21 NOTE — Unmapped (Signed)
[facility] Refill Coordination Note    Specialty Medication(s) to be Shipped:   Hematology/Oncology: Venclexta 100mg        Katrina Gould, DOB: 10-01-1940  Phone: [phone] (home) [phone] (work)      All above HIPAA information was verified with patient.     Completed refill call assessment today to schedule patient's medication shipment from the [facility] [phone]).       Specialty medication(s) and dose(s) confirmed: Regimen is correct and unchanged.   Changes to medications: Katrina Gould reports no changes reported at this time.  Changes to insurance: No  Questions for the pharmacist: No    The patient will receive a drug information handout for each medication shipped and additional FDA Medication Guides as required.      DISEASE/MEDICATION-SPECIFIC INFORMATION        N/A    ADHERENCE     Medication Adherence    Patient reported X missed doses in the last month:  0  Specialty Medication:  venetoclax (VENCLEXTA) 100 mg  Patient is on additional specialty medications:  No  Patient is on more than two specialty medications:  No  Any gaps in refill history greater than 2 weeks in the last 3 months:  no  Demonstrates understanding of importance of adherence:  yes  Informant:  patient  Reliability of informant:  reliable              Confirmed plan for next specialty medication refill:  delivery by pharmacy          Refill Coordination    Has the Patients' Contact Information Changed:  No  Is the Shipping Address Different:  No         MEDICARE PART B DOCUMENTATION     venetoclax (VENCLEXTA) 100 mg: Patient has 8 days worth on hand.    SHIPPING     Shipping address confirmed in Epic.     Delivery Scheduled: Yes, Expected medication delivery date: 03/01/18 via UPS or courier.     Medication will be delivered via UPS to the home address in Epic WAM.    Katrina Gould   [facility] Specialty Technician

## 2018-02-26 ENCOUNTER — Ambulatory Visit: Admit: 2018-02-26 | Discharge: 2018-02-27 | Payer: MEDICARE

## 2018-02-26 ENCOUNTER — Other Ambulatory Visit: Admit: 2018-02-26 | Discharge: 2018-02-27 | Payer: MEDICARE

## 2018-02-26 DIAGNOSIS — C9110 Chronic lymphocytic leukemia of B-cell type not having achieved remission: Principal | ICD-10-CM

## 2018-02-26 DIAGNOSIS — D701 Agranulocytosis secondary to cancer chemotherapy: Secondary | ICD-10-CM

## 2018-02-26 DIAGNOSIS — T451X5A Adverse effect of antineoplastic and immunosuppressive drugs, initial encounter: Secondary | ICD-10-CM

## 2018-02-26 LAB — COMPREHENSIVE METABOLIC PANEL
ALBUMIN: 3.7 g/dL (ref 3.5–5.0)
ALKALINE PHOSPHATASE: 97 U/L (ref 38–126)
ALT (SGPT): 17 U/L (ref 15–48)
ANION GAP: 9 mmol/L (ref 7–15)
AST (SGOT): 42 U/L — ABNORMAL HIGH (ref 14–38)
BILIRUBIN TOTAL: 0.7 mg/dL (ref 0.0–1.2)
BUN / CREAT RATIO: 14
CALCIUM: 8.7 mg/dL (ref 8.5–10.2)
CHLORIDE: 112 mmol/L — ABNORMAL HIGH (ref 98–107)
CO2: 19 mmol/L — ABNORMAL LOW (ref 22.0–30.0)
CREATININE: 0.85 mg/dL (ref 0.60–1.00)
EGFR CKD-EPI AA FEMALE: 75 mL/min/{1.73_m2} (ref >=60)
EGFR CKD-EPI NON-AA FEMALE: 65 mL/min/{1.73_m2} (ref >=60)
GLUCOSE RANDOM: 108 mg/dL (ref 65–179)
POTASSIUM: 3.8 mmol/L (ref 3.5–5.0)
PROTEIN TOTAL: 6.3 g/dL — ABNORMAL LOW (ref 6.5–8.3)

## 2018-02-26 LAB — CBC W/ AUTO DIFF
BASOPHILS RELATIVE PERCENT: 0.3 %
EOSINOPHILS ABSOLUTE COUNT: 0 10*9/L (ref 0.0–0.4)
EOSINOPHILS RELATIVE PERCENT: 0.7 %
HEMATOCRIT: 35.7 % — ABNORMAL LOW (ref 36.0–46.0)
HEMOGLOBIN: 11.9 g/dL — ABNORMAL LOW (ref 12.0–16.0)
LYMPHOCYTES ABSOLUTE COUNT: 0.6 10*9/L — ABNORMAL LOW (ref 1.5–5.0)
LYMPHOCYTES RELATIVE PERCENT: 31.7 %
MEAN CORPUSCULAR HEMOGLOBIN CONC: 33.3 g/dL (ref 31.0–37.0)
MEAN CORPUSCULAR HEMOGLOBIN: 30.6 pg (ref 26.0–34.0)
MEAN CORPUSCULAR VOLUME: 92 fL (ref 80.0–100.0)
MEAN PLATELET VOLUME: 11.6 fL — ABNORMAL HIGH (ref 7.0–10.0)
MONOCYTES ABSOLUTE COUNT: 0.4 10*9/L (ref 0.2–0.8)
MONOCYTES RELATIVE PERCENT: 19.9 %
NEUTROPHILS ABSOLUTE COUNT: 0.8 10*9/L — ABNORMAL LOW (ref 2.0–7.5)
NEUTROPHILS RELATIVE PERCENT: 45.2 %
PLATELET COUNT: 35 10*9/L — ABNORMAL LOW (ref 150–440)
RED BLOOD CELL COUNT: 3.88 10*12/L — ABNORMAL LOW (ref 4.00–5.20)
RED CELL DISTRIBUTION WIDTH: 16.9 % — ABNORMAL HIGH (ref 12.0–15.0)
WBC ADJUSTED: 1.9 10*9/L — ABNORMAL LOW (ref 4.5–11.0)

## 2018-02-26 LAB — SMEAR REVIEW

## 2018-02-26 LAB — MEAN CORPUSCULAR HEMOGLOBIN: Lab: 30.6

## 2018-02-26 LAB — SLIDE REVIEW

## 2018-02-26 LAB — PHOSPHORUS: Phosphate:MCnc:Pt:Ser/Plas:Qn:: 3.7

## 2018-02-26 LAB — URIC ACID: Urate:MCnc:Pt:Ser/Plas:Qn:: 3.9

## 2018-02-26 LAB — CO2: Carbon dioxide:SCnc:Pt:Ser/Plas:Qn:: 19 — ABNORMAL LOW

## 2018-02-26 NOTE — Unmapped (Signed)
Labs drawn and sent for analysis.  Care provided by  Sallee Provencal, RN

## 2018-02-26 NOTE — Unmapped (Signed)
If you feel like this is an emergency please call 911.  For appointments or questions Monday through Friday 8AM-5PM please call [phone] or Toll Free [phone]. For Medical questions or concerns ask for the Nurse Triage Line.  On Nights, Weekends, and Holidays call [phone] and ask for the Oncologist on Call.  Reasons to call the Nurse Triage Line:  Fever of 100.5 or greater  Nausea and/or vomiting not relived with nausea medicine  Diarrhea or constipation  Severe pain not relieved with usual pain regimen  Shortness of breath  Uncontrolled bleeding  Mental status changes  Lab on 02/26/2018   Component Date Value Ref Range Status   ??? Sodium 02/26/2018 140  135 - 145 mmol/L Final   ??? Potassium 02/26/2018 3.8  3.5 - 5.0 mmol/L Final   ??? Chloride 02/26/2018 112* 98 - 107 mmol/L Final   ??? CO2 02/26/2018 19.0* 22.0 - 30.0 mmol/L Final   ??? BUN 02/26/2018 12  7 - 21 mg/dL Final   ??? Creatinine 02/26/2018 0.85  0.60 - 1.00 mg/dL Final   ??? BUN/Creatinine Ratio 02/26/2018 14   Final   ??? EGFR CKD-EPI Non-African American,* 02/26/2018 65  >=60 mL/min/1.62m2 Final   ??? EGFR CKD-EPI African American, Fem* 02/26/2018 75  >=60 mL/min/1.62m2 Final   ??? Glucose 02/26/2018 108  65 - 179 mg/dL Final   ??? Calcium 57/84/6962 8.7  8.5 - 10.2 mg/dL Final   ??? Albumin 95/28/4132 3.7  3.5 - 5.0 g/dL Final   ??? Total Protein 02/26/2018 6.3* 6.5 - 8.3 g/dL Final   ??? Total Bilirubin 02/26/2018 0.7  0.0 - 1.2 mg/dL Final   ??? AST 44/05/270 42* 14 - 38 U/L Final   ??? ALT 02/26/2018 17  15 - 48 U/L Final   ??? Alkaline Phosphatase 02/26/2018 97  38 - 126 U/L Final   ??? Anion Gap 02/26/2018 9  7 - 15 mmol/L Final   ??? Uric Acid 02/26/2018 3.9  3.0 - 6.5 mg/dL Final   ??? Phosphorus 02/26/2018 3.7  2.9 - 4.7 mg/dL Final   ??? WBC 53/66/4403 1.9* 4.5 - 11.0 10*9/L Final   ??? RBC 02/26/2018 3.88* 4.00 - 5.20 10*12/L Final   ??? HGB 02/26/2018 11.9* 12.0 - 16.0 g/dL Final   ??? HCT 47/42/5956 35.7* 36.0 - 46.0 % Final   ??? MCV 02/26/2018 92.0  80.0 - 100.0 fL Final   ??? MCH 02/26/2018 30.6  26.0 - 34.0 pg Final   ??? MCHC 02/26/2018 33.3  31.0 - 37.0 g/dL Final   ??? RDW 38/75/6433 16.9* 12.0 - 15.0 % Final   ??? MPV 02/26/2018 11.6* 7.0 - 10.0 fL Final   ??? Platelet 02/26/2018 35* 150 - 440 10*9/L Final   ??? Variable HGB Concentration 02/26/2018 Moderate* Not Present Final   ??? Neutrophils % 02/26/2018 45.2  % Final   ??? Lymphocytes % 02/26/2018 31.7  % Final   ??? Monocytes % 02/26/2018 19.9  % Final   ??? Eosinophils % 02/26/2018 0.7  % Final   ??? Basophils % 02/26/2018 0.3  % Final   ??? Neutrophil Left Shift 02/26/2018 2+* Not Present Final   ??? Absolute Neutrophils 02/26/2018 0.8* 2.0 - 7.5 10*9/L Final   ??? Absolute Lymphocytes 02/26/2018 0.6* 1.5 - 5.0 10*9/L Final   ??? Absolute Monocytes 02/26/2018 0.4  0.2 - 0.8 10*9/L Final   ??? Absolute Eosinophils 02/26/2018 0.0  0.0 - 0.4 10*9/L Final   ??? Absolute Basophils 02/26/2018 0.0  0.0 - 0.1  10*9/L Final   ??? Large Unstained Cells 02/26/2018 2  0 - 4 % Final   ??? Macrocytosis 02/26/2018 Slight* Not Present Final   ??? Anisocytosis 02/26/2018 Slight* Not Present Final   ??? Hypochromasia 02/26/2018 Marked* Not Present Final

## 2018-02-28 MED FILL — VENCLEXTA 100 MG TABLET: 30 days supply | Qty: 60 | Fill #0 | Status: AC

## 2018-03-05 ENCOUNTER — Ambulatory Visit: Admit: 2018-03-05 | Discharge: 2018-03-06 | Payer: MEDICARE

## 2018-03-05 ENCOUNTER — Other Ambulatory Visit: Admit: 2018-03-05 | Discharge: 2018-03-06 | Payer: MEDICARE

## 2018-03-05 DIAGNOSIS — T451X5A Adverse effect of antineoplastic and immunosuppressive drugs, initial encounter: Secondary | ICD-10-CM

## 2018-03-05 DIAGNOSIS — C9110 Chronic lymphocytic leukemia of B-cell type not having achieved remission: Secondary | ICD-10-CM

## 2018-03-05 DIAGNOSIS — D701 Agranulocytosis secondary to cancer chemotherapy: Principal | ICD-10-CM

## 2018-03-05 LAB — SMEAR REVIEW

## 2018-03-05 LAB — CBC W/ AUTO DIFF
BASOPHILS ABSOLUTE COUNT: 0 10*9/L (ref 0.0–0.1)
BASOPHILS RELATIVE PERCENT: 0.2 %
EOSINOPHILS ABSOLUTE COUNT: 0 10*9/L (ref 0.0–0.4)
EOSINOPHILS RELATIVE PERCENT: 0.4 %
HEMATOCRIT: 34.3 % — ABNORMAL LOW (ref 36.0–46.0)
HEMOGLOBIN: 11.6 g/dL — ABNORMAL LOW (ref 12.0–16.0)
LARGE UNSTAINED CELLS: 4 % (ref 0–4)
LYMPHOCYTES RELATIVE PERCENT: 34.1 %
MEAN CORPUSCULAR HEMOGLOBIN CONC: 33.8 g/dL (ref 31.0–37.0)
MEAN CORPUSCULAR HEMOGLOBIN: 30.7 pg (ref 26.0–34.0)
MEAN PLATELET VOLUME: 10.7 fL — ABNORMAL HIGH (ref 7.0–10.0)
MONOCYTES ABSOLUTE COUNT: 0.2 10*9/L (ref 0.2–0.8)
MONOCYTES RELATIVE PERCENT: 10.1 %
NEUTROPHILS ABSOLUTE COUNT: 0.9 10*9/L — ABNORMAL LOW (ref 2.0–7.5)
NEUTROPHILS RELATIVE PERCENT: 51.4 %
PLATELET COUNT: 39 10*9/L — ABNORMAL LOW (ref 150–440)
RED BLOOD CELL COUNT: 3.79 10*12/L — ABNORMAL LOW (ref 4.00–5.20)
RED CELL DISTRIBUTION WIDTH: 17.3 % — ABNORMAL HIGH (ref 12.0–15.0)
WBC ADJUSTED: 1.7 10*9/L — ABNORMAL LOW (ref 4.5–11.0)

## 2018-03-05 LAB — COMPREHENSIVE METABOLIC PANEL
ALBUMIN: 3.5 g/dL (ref 3.5–5.0)
ALT (SGPT): 13 U/L (ref <35)
ANION GAP: 9 mmol/L (ref 7–15)
AST (SGOT): 24 U/L (ref 14–38)
BILIRUBIN TOTAL: 0.4 mg/dL (ref 0.0–1.2)
BLOOD UREA NITROGEN: 9 mg/dL (ref 7–21)
BUN / CREAT RATIO: 11
CALCIUM: 8.8 mg/dL (ref 8.5–10.2)
CHLORIDE: 108 mmol/L — ABNORMAL HIGH (ref 98–107)
CO2: 23 mmol/L (ref 22.0–30.0)
CREATININE: 0.8 mg/dL (ref 0.60–1.00)
EGFR CKD-EPI AA FEMALE: 81 mL/min/{1.73_m2} (ref >=60)
GLUCOSE RANDOM: 82 mg/dL (ref 65–179)
POTASSIUM: 4 mmol/L (ref 3.5–5.0)
PROTEIN TOTAL: 5.9 g/dL — ABNORMAL LOW (ref 6.5–8.3)
SODIUM: 140 mmol/L (ref 135–145)

## 2018-03-05 LAB — URIC ACID: Urate:MCnc:Pt:Ser/Plas:Qn:: 3.5

## 2018-03-05 LAB — PHOSPHORUS: Phosphate:MCnc:Pt:Ser/Plas:Qn:: 4.3

## 2018-03-05 LAB — BLOOD UREA NITROGEN: Urea nitrogen:MCnc:Pt:Ser/Plas:Qn:: 9

## 2018-03-05 LAB — MACROCYTES

## 2018-03-05 IMAGING — MG MM DIGITAL DIAGNOSTIC BILAT W/ TOMO W/ CAD
8 of 12 series · 8 of 28 positions shown · non-contrast
Comparison: Previous exam(s).

CLINICAL DATA: 76-year-old female with history of lymphoma and
chronic right nipple discharge.

EXAM:
2D DIGITAL DIAGNOSTIC BILATERAL MAMMOGRAM WITH CAD AND ADJUNCT TOMO

[L MLO]
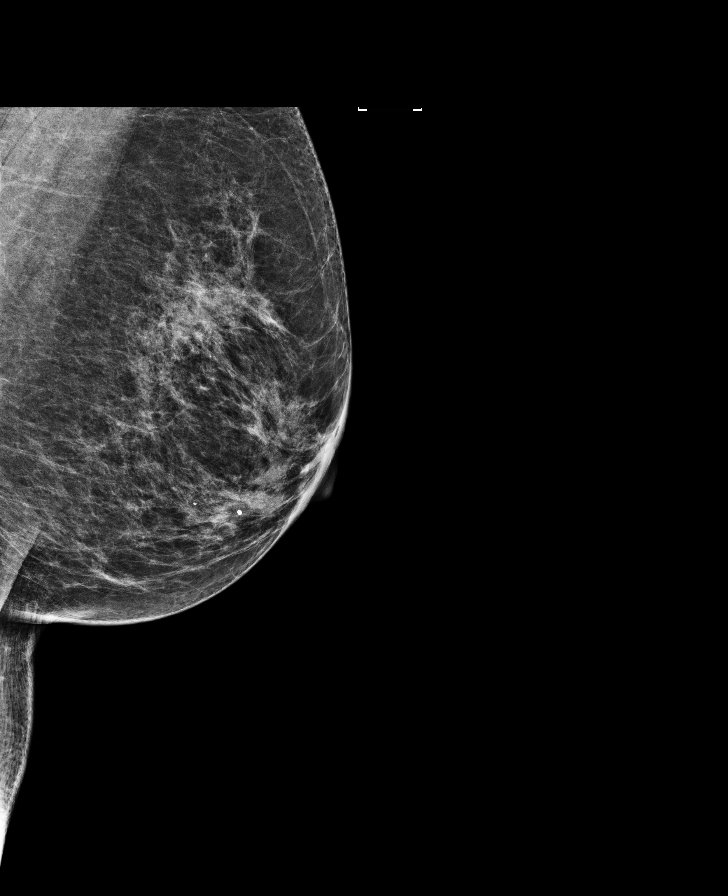

[R MLO]
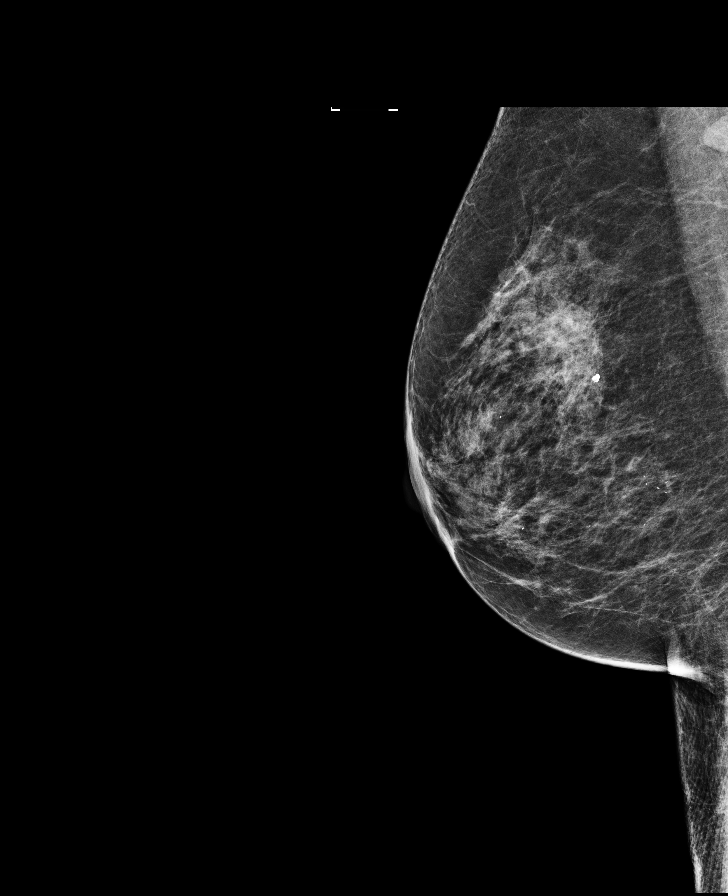

[R CC synth-2D]
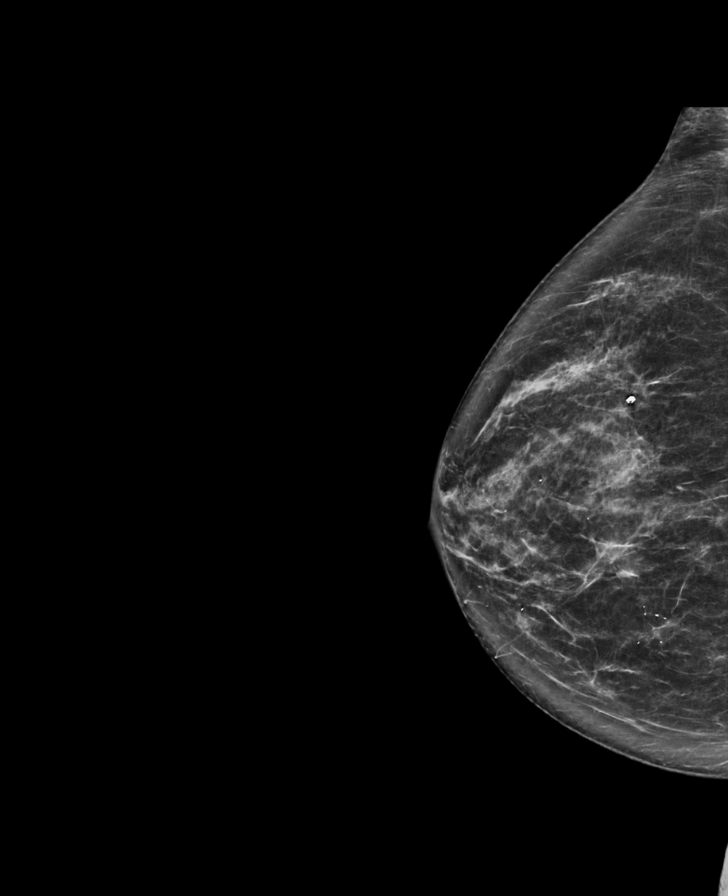

[R CC]
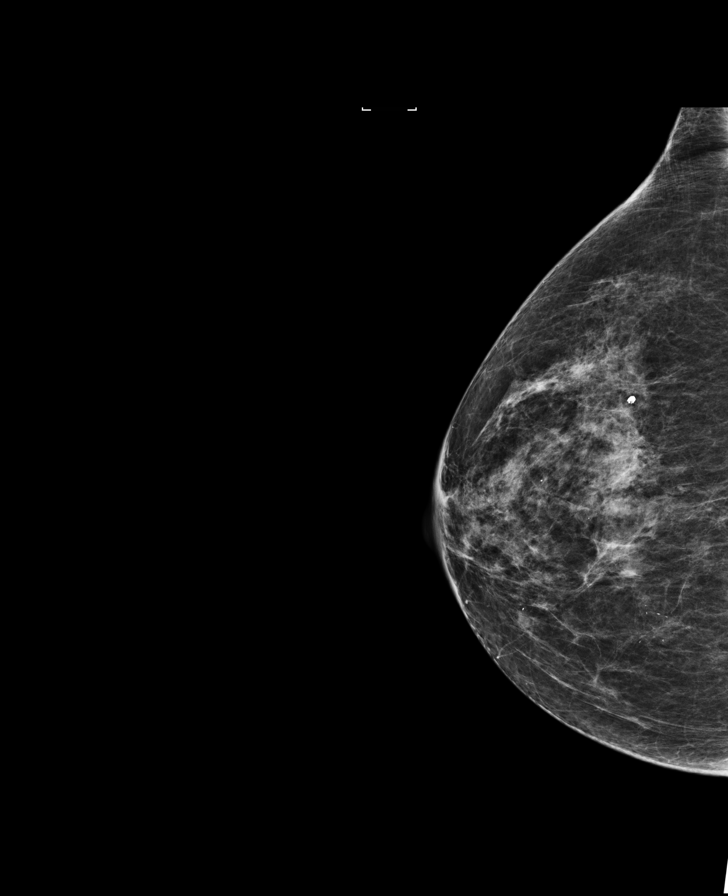

[L CC synth-2D]
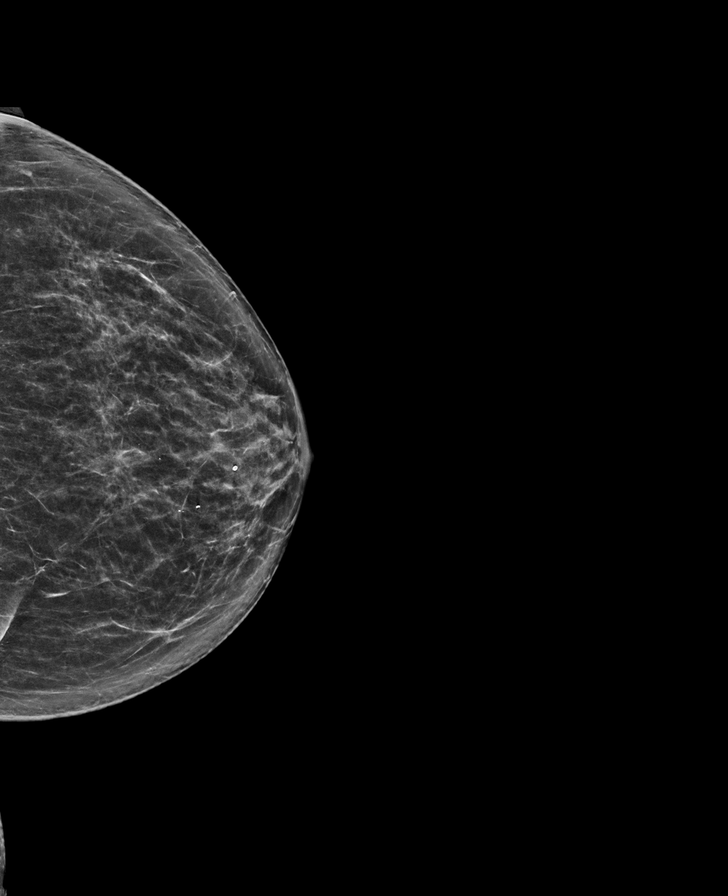

[L MLO synth-2D]
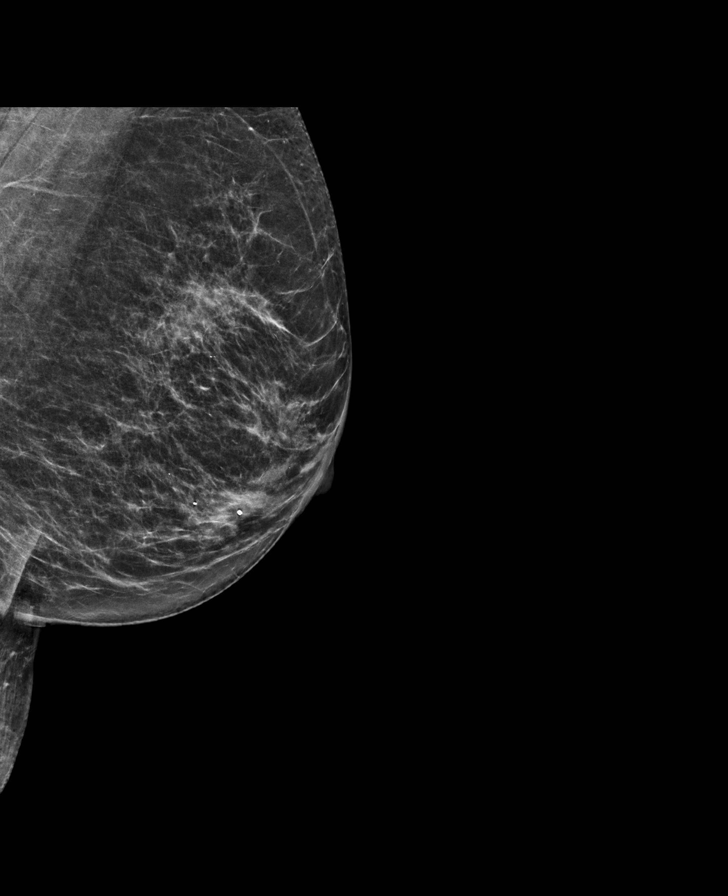

[R MLO synth-2D]
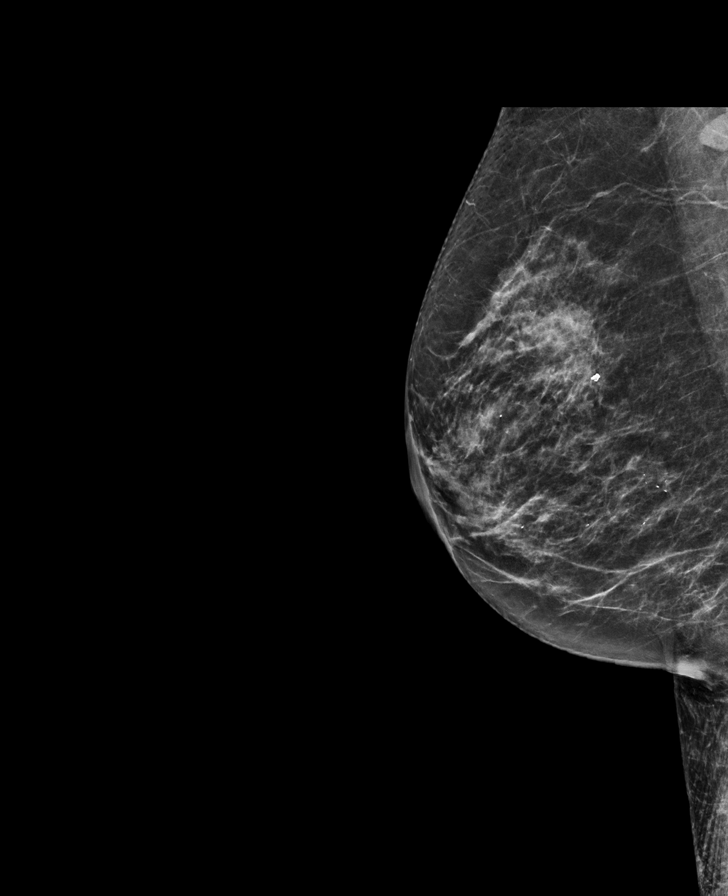

[L CC]
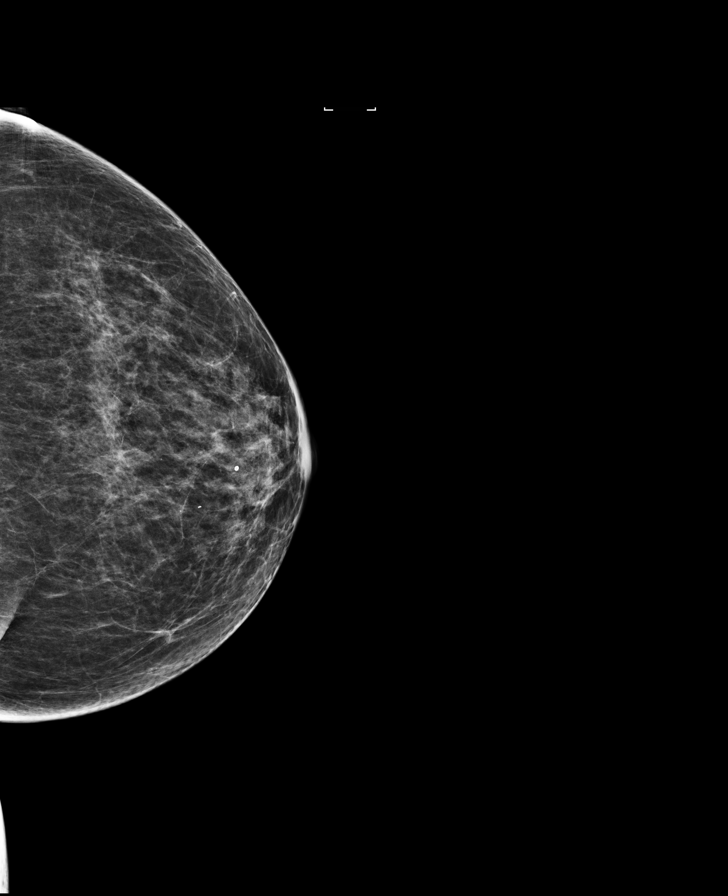

[8 of 28 positions shown; findings below may reference images not displayed]

ACR Breast Density Category b: There are scattered areas of
fibroglandular density.
FINDINGS: 2D and 3D full field views of both breasts demonstrate no suspicious
mass, distortion or worrisome calcifications bilaterally.

Mammographic images were processed with CAD.
IMPRESSION: No mammographic evidence of breast malignancy.

RECOMMENDATION:
Bilateral screening mammograms in 1 year.

I have discussed the findings and recommendations with the patient.
Results were also provided in writing at the conclusion of the
visit. If applicable, a reminder letter will be sent to the patient
regarding the next appointment.

BI-RADS CATEGORY  1: Negative.

## 2018-03-05 NOTE — Unmapped (Signed)
Labs drawn and sent for analysis.  Care provided by  Laveda Abbe, RN

## 2018-03-05 NOTE — Unmapped (Signed)
If you feel like this is an emergency please call 911.  For appointments or questions Monday through Friday 8AM-5PM please call [phone] or Toll Free [phone]. For Medical questions or concerns ask for the Nurse Triage Line.  On Nights, Weekends, and Holidays call [phone] and ask for the Oncologist on Call.  Reasons to call the Nurse Triage Line:  Fever of 100.5 or greater  Nausea and/or vomiting not relived with nausea medicine  Diarrhea or constipation  Severe pain not relieved with usual pain regimen  Shortness of breath  Uncontrolled bleeding  Mental status changes    Lab on 03/05/2018   Component Date Value Ref Range Status   ??? Sodium 03/05/2018 140  135 - 145 mmol/L Final   ??? Potassium 03/05/2018 4.0  3.5 - 5.0 mmol/L Final   ??? Chloride 03/05/2018 108* 98 - 107 mmol/L Final   ??? CO2 03/05/2018 23.0  22.0 - 30.0 mmol/L Final   ??? BUN 03/05/2018 9  7 - 21 mg/dL Final   ??? Creatinine 03/05/2018 0.80  0.60 - 1.00 mg/dL Final   ??? BUN/Creatinine Ratio 03/05/2018 11   Final   ??? EGFR CKD-EPI Non-African American,* 03/05/2018 70  >=60 mL/min/1.74m2 Final   ??? EGFR CKD-EPI African American, Fem* 03/05/2018 81  >=60 mL/min/1.66m2 Final   ??? Glucose 03/05/2018 82  65 - 179 mg/dL Final   ??? Calcium 57/84/6962 8.8  8.5 - 10.2 mg/dL Final   ??? Albumin 95/28/4132 3.5  3.5 - 5.0 g/dL Final   ??? Total Protein 03/05/2018 5.9* 6.5 - 8.3 g/dL Final   ??? Total Bilirubin 03/05/2018 0.4  0.0 - 1.2 mg/dL Final   ??? AST 44/05/270 24  14 - 38 U/L Final   ??? ALT 03/05/2018 13  <35 U/L Final   ??? Alkaline Phosphatase 03/05/2018 84  38 - 126 U/L Final   ??? Anion Gap 03/05/2018 9  7 - 15 mmol/L Final   ??? Uric Acid 03/05/2018 3.5  3.0 - 6.5 mg/dL Final   ??? Phosphorus 03/05/2018 4.3  2.9 - 4.7 mg/dL Final   ??? WBC 53/66/4403 1.7* 4.5 - 11.0 10*9/L Final   ??? RBC 03/05/2018 3.79* 4.00 - 5.20 10*12/L Final   ??? HGB 03/05/2018 11.6* 12.0 - 16.0 g/dL Final   ??? HCT 47/42/5956 34.3* 36.0 - 46.0 % Final   ??? MCV 03/05/2018 90.6  80.0 - 100.0 fL Final   ??? MCH 03/05/2018 30.7  26.0 - 34.0 pg Final   ??? MCHC 03/05/2018 33.8  31.0 - 37.0 g/dL Final   ??? RDW 38/75/6433 17.3* 12.0 - 15.0 % Final   ??? MPV 03/05/2018 10.7* 7.0 - 10.0 fL Final   ??? Platelet 03/05/2018 39* 150 - 440 10*9/L Final   ??? Variable HGB Concentration 03/05/2018 Slight* Not Present Final   ??? Neutrophils % 03/05/2018 51.4  % Final   ??? Lymphocytes % 03/05/2018 34.1  % Final   ??? Monocytes % 03/05/2018 10.1  % Final   ??? Eosinophils % 03/05/2018 0.4  % Final   ??? Basophils % 03/05/2018 0.2  % Final   ??? Neutrophil Left Shift 03/05/2018 1+* Not Present Final   ??? Absolute Neutrophils 03/05/2018 0.9* 2.0 - 7.5 10*9/L Final   ??? Absolute Lymphocytes 03/05/2018 0.6* 1.5 - 5.0 10*9/L Final   ??? Absolute Monocytes 03/05/2018 0.2  0.2 - 0.8 10*9/L Final   ??? Absolute Eosinophils 03/05/2018 0.0  0.0 - 0.4 10*9/L Final   ??? Absolute Basophils 03/05/2018 0.0  0.0 -  0.1 10*9/L Final   ??? Large Unstained Cells 03/05/2018 4  0 - 4 % Final   ??? Macrocytosis 03/05/2018 Slight* Not Present Final   ??? Anisocytosis 03/05/2018 Slight* Not Present Final   ??? Hypochromasia 03/05/2018 Moderate* Not Present Final   ??? Smear Review Comments 03/05/2018 See Comment* Undefined Final    Slide reviewed  Myelocytes present-rare.

## 2018-03-06 NOTE — Unmapped (Signed)
Encounter addended by: Gretchen Portela, RN on: 03/05/2018 9:49 PM   Actions taken: Flowsheet accepted

## 2018-03-06 NOTE — Unmapped (Signed)
Pt A&O x4, NAD.  Pt denies any complaints since last visit.  Resting comfortably.    Pt tolerated injection without difficulty.  No questions or concerns voiced at d/c.  Port deaccessed per protocol.  Pt left infusion center NAD, ambulatory.

## 2018-03-12 ENCOUNTER — Other Ambulatory Visit: Admit: 2018-03-12 | Discharge: 2018-03-12 | Payer: MEDICARE

## 2018-03-12 ENCOUNTER — Ambulatory Visit: Admit: 2018-03-12 | Discharge: 2018-03-12 | Payer: MEDICARE

## 2018-03-12 DIAGNOSIS — T451X5A Adverse effect of antineoplastic and immunosuppressive drugs, initial encounter: Secondary | ICD-10-CM

## 2018-03-12 DIAGNOSIS — D701 Agranulocytosis secondary to cancer chemotherapy: Principal | ICD-10-CM

## 2018-03-12 DIAGNOSIS — C9110 Chronic lymphocytic leukemia of B-cell type not having achieved remission: Secondary | ICD-10-CM

## 2018-03-12 LAB — CBC W/ AUTO DIFF
BASOPHILS ABSOLUTE COUNT: 0 10*9/L (ref 0.0–0.1)
BASOPHILS RELATIVE PERCENT: 0.4 %
EOSINOPHILS ABSOLUTE COUNT: 0 10*9/L (ref 0.0–0.4)
EOSINOPHILS RELATIVE PERCENT: 0.6 %
HEMOGLOBIN: 11.6 g/dL — ABNORMAL LOW (ref 12.0–16.0)
LARGE UNSTAINED CELLS: 3 % (ref 0–4)
LYMPHOCYTES ABSOLUTE COUNT: 0.7 10*9/L — ABNORMAL LOW (ref 1.5–5.0)
LYMPHOCYTES RELATIVE PERCENT: 41 %
MEAN CORPUSCULAR HEMOGLOBIN CONC: 34.1 g/dL (ref 31.0–37.0)
MEAN CORPUSCULAR VOLUME: 91.1 fL (ref 80.0–100.0)
MEAN PLATELET VOLUME: 10.5 fL — ABNORMAL HIGH (ref 7.0–10.0)
MONOCYTES ABSOLUTE COUNT: 0.1 10*9/L — ABNORMAL LOW (ref 0.2–0.8)
MONOCYTES RELATIVE PERCENT: 7.9 %
NEUTROPHILS ABSOLUTE COUNT: 0.8 10*9/L — ABNORMAL LOW (ref 2.0–7.5)
NEUTROPHILS RELATIVE PERCENT: 47.2 %
PLATELET COUNT: 29 10*9/L — ABNORMAL LOW (ref 150–440)
RED BLOOD CELL COUNT: 3.72 10*12/L — ABNORMAL LOW (ref 4.00–5.20)
RED CELL DISTRIBUTION WIDTH: 17.4 % — ABNORMAL HIGH (ref 12.0–15.0)
WBC ADJUSTED: 1.6 10*9/L — ABNORMAL LOW (ref 4.5–11.0)

## 2018-03-12 LAB — COMPREHENSIVE METABOLIC PANEL
ALBUMIN: 3.7 g/dL (ref 3.5–5.0)
ALKALINE PHOSPHATASE: 81 U/L (ref 38–126)
ALT (SGPT): 13 U/L (ref <35)
ANION GAP: 9 mmol/L (ref 7–15)
AST (SGOT): 23 U/L (ref 14–38)
BILIRUBIN TOTAL: 0.5 mg/dL (ref 0.0–1.2)
BLOOD UREA NITROGEN: 14 mg/dL (ref 7–21)
BUN / CREAT RATIO: 16
CALCIUM: 8.9 mg/dL (ref 8.5–10.2)
CHLORIDE: 111 mmol/L — ABNORMAL HIGH (ref 98–107)
CO2: 19 mmol/L — ABNORMAL LOW (ref 22.0–30.0)
CREATININE: 0.86 mg/dL (ref 0.60–1.00)
EGFR CKD-EPI AA FEMALE: 74 mL/min/{1.73_m2} (ref >=60)
EGFR CKD-EPI NON-AA FEMALE: 64 mL/min/{1.73_m2} (ref >=60)
POTASSIUM: 3.5 mmol/L (ref 3.5–5.0)
PROTEIN TOTAL: 6.2 g/dL — ABNORMAL LOW (ref 6.5–8.3)
SODIUM: 139 mmol/L (ref 135–145)

## 2018-03-12 LAB — ALBUMIN: Albumin:MCnc:Pt:Ser/Plas:Qn:: 3.7

## 2018-03-12 LAB — PHOSPHORUS: Phosphate:MCnc:Pt:Ser/Plas:Qn:: 4.1

## 2018-03-12 LAB — NEUTROPHIL LEFT SHIFT

## 2018-03-12 LAB — URIC ACID: Urate:MCnc:Pt:Ser/Plas:Qn:: 3.5

## 2018-03-12 NOTE — Unmapped (Signed)
Pt received granix into the right lower abdomen. Tolerated well. Reported overall feeling run down. Did not meet parameters for transfusion. Port flushed with heparin and deaccessed. Pt left ambulatory and stable

## 2018-03-12 NOTE — Unmapped (Signed)
Lab on 03/12/2018   Component Date Value Ref Range Status   ??? Sodium 03/12/2018 139  135 - 145 mmol/L Final   ??? Potassium 03/12/2018 3.5  3.5 - 5.0 mmol/L Final   ??? Chloride 03/12/2018 111* 98 - 107 mmol/L Final   ??? CO2 03/12/2018 19.0* 22.0 - 30.0 mmol/L Final   ??? BUN 03/12/2018 14  7 - 21 mg/dL Final   ??? Creatinine 03/12/2018 0.86  0.60 - 1.00 mg/dL Final   ??? BUN/Creatinine Ratio 03/12/2018 16   Final   ??? EGFR CKD-EPI Non-African American,* 03/12/2018 64  >=60 mL/min/1.59m2 Final   ??? EGFR CKD-EPI African American, Fem* 03/12/2018 74  >=60 mL/min/1.76m2 Final   ??? Glucose 03/12/2018 90  65 - 179 mg/dL Final   ??? Calcium 16/01/9603 8.9  8.5 - 10.2 mg/dL Final   ??? Albumin 54/12/8117 3.7  3.5 - 5.0 g/dL Final   ??? Total Protein 03/12/2018 6.2* 6.5 - 8.3 g/dL Final   ??? Total Bilirubin 03/12/2018 0.5  0.0 - 1.2 mg/dL Final   ??? AST 14/78/2956 23  14 - 38 U/L Final   ??? ALT 03/12/2018 13  <35 U/L Final   ??? Alkaline Phosphatase 03/12/2018 81  38 - 126 U/L Final   ??? Anion Gap 03/12/2018 9  7 - 15 mmol/L Final   ??? Uric Acid 03/12/2018 3.5  3.0 - 6.5 mg/dL Final   ??? Phosphorus 03/12/2018 4.1  2.9 - 4.7 mg/dL Final   ??? WBC 21/30/8657 1.6* 4.5 - 11.0 10*9/L Final   ??? RBC 03/12/2018 3.72* 4.00 - 5.20 10*12/L Final   ??? HGB 03/12/2018 11.6* 12.0 - 16.0 g/dL Final   ??? HCT 84/69/6295 33.9* 36.0 - 46.0 % Final   ??? MCV 03/12/2018 91.1  80.0 - 100.0 fL Final   ??? MCH 03/12/2018 31.0  26.0 - 34.0 pg Final   ??? MCHC 03/12/2018 34.1  31.0 - 37.0 g/dL Final   ??? RDW 28/41/3244 17.4* 12.0 - 15.0 % Final   ??? MPV 03/12/2018 10.5* 7.0 - 10.0 fL Final   ??? Platelet 03/12/2018 29* 150 - 440 10*9/L Final   ??? Variable HGB Concentration 03/12/2018 Slight* Not Present Final   ??? Neutrophils % 03/12/2018 47.2  % Final   ??? Lymphocytes % 03/12/2018 41.0  % Final   ??? Monocytes % 03/12/2018 7.9  % Final   ??? Eosinophils % 03/12/2018 0.6  % Final   ??? Basophils % 03/12/2018 0.4  % Final   ??? Neutrophil Left Shift 03/12/2018 1+* Not Present Final   ??? Absolute Neutrophils 03/12/2018 0.8* 2.0 - 7.5 10*9/L Final   ??? Absolute Lymphocytes 03/12/2018 0.7* 1.5 - 5.0 10*9/L Final   ??? Absolute Monocytes 03/12/2018 0.1* 0.2 - 0.8 10*9/L Final   ??? Absolute Eosinophils 03/12/2018 0.0  0.0 - 0.4 10*9/L Final   ??? Absolute Basophils 03/12/2018 0.0  0.0 - 0.1 10*9/L Final   ??? Large Unstained Cells 03/12/2018 3  0 - 4 % Final   ??? Macrocytosis 03/12/2018 Slight* Not Present Final   ??? Anisocytosis 03/12/2018 Slight* Not Present Final   ??? Hypochromasia 03/12/2018 Moderate* Not Present Final       If you feel like this is an emergency please call 911.  For appointments or questions Monday through Friday 8AM-5PM please call [phone] or Toll Free [phone]. For Medical questions or concerns ask for the Nurse Triage Line.  On Nights, Weekends, and Holidays call [phone] and ask for the Oncologist on Call.  Reasons to call the Nurse Triage Line:  Fever of 100.5 or greater  Nausea and/or vomiting not relived with nausea medicine  Diarrhea or constipation  Severe pain not relieved with usual pain regimen  Shortness of breath  Uncontrolled bleeding  Mental status changes

## 2018-03-12 NOTE — Unmapped (Signed)
Labs drawn and sent for analysis.  Care provided by  Wynetta Fines, RN

## 2018-03-19 ENCOUNTER — Ambulatory Visit
Admit: 2018-03-19 | Discharge: 2018-03-19 | Payer: MEDICARE | Attending: Hematology & Oncology | Primary: Hematology & Oncology

## 2018-03-19 ENCOUNTER — Other Ambulatory Visit: Admit: 2018-03-19 | Discharge: 2018-03-19 | Payer: MEDICARE

## 2018-03-19 ENCOUNTER — Ambulatory Visit: Admit: 2018-03-19 | Discharge: 2018-03-19 | Payer: MEDICARE

## 2018-03-19 DIAGNOSIS — T451X5A Adverse effect of antineoplastic and immunosuppressive drugs, initial encounter: Secondary | ICD-10-CM

## 2018-03-19 DIAGNOSIS — C9110 Chronic lymphocytic leukemia of B-cell type not having achieved remission: Principal | ICD-10-CM

## 2018-03-19 DIAGNOSIS — D591 Other autoimmune hemolytic anemias: Secondary | ICD-10-CM

## 2018-03-19 DIAGNOSIS — D701 Agranulocytosis secondary to cancer chemotherapy: Secondary | ICD-10-CM

## 2018-03-19 LAB — COMPREHENSIVE METABOLIC PANEL
ALBUMIN: 4 g/dL (ref 3.5–5.0)
ALKALINE PHOSPHATASE: 85 U/L (ref 38–126)
ALT (SGPT): 14 U/L (ref <35)
ANION GAP: 9 mmol/L (ref 7–15)
AST (SGOT): 24 U/L (ref 14–38)
BILIRUBIN TOTAL: 0.6 mg/dL (ref 0.0–1.2)
BLOOD UREA NITROGEN: 14 mg/dL (ref 7–21)
BUN / CREAT RATIO: 16
CALCIUM: 9.2 mg/dL (ref 8.5–10.2)
CO2: 22 mmol/L (ref 22.0–30.0)
CREATININE: 0.86 mg/dL (ref 0.60–1.00)
EGFR CKD-EPI AA FEMALE: 74 mL/min/{1.73_m2} (ref >=60)
EGFR CKD-EPI NON-AA FEMALE: 64 mL/min/{1.73_m2} (ref >=60)
GLUCOSE RANDOM: 108 mg/dL (ref 65–179)
PROTEIN TOTAL: 6.5 g/dL (ref 6.5–8.3)
SODIUM: 139 mmol/L (ref 135–145)

## 2018-03-19 LAB — CBC W/ AUTO DIFF
BASOPHILS RELATIVE PERCENT: 0.5 %
EOSINOPHILS ABSOLUTE COUNT: 0 10*9/L (ref 0.0–0.4)
EOSINOPHILS RELATIVE PERCENT: 1.2 %
HEMATOCRIT: 36.4 % (ref 36.0–46.0)
HEMOGLOBIN: 12.2 g/dL (ref 12.0–16.0)
LARGE UNSTAINED CELLS: 5 % — ABNORMAL HIGH (ref 0–4)
LYMPHOCYTES ABSOLUTE COUNT: 1 10*9/L — ABNORMAL LOW (ref 1.5–5.0)
LYMPHOCYTES RELATIVE PERCENT: 41.6 %
MEAN CORPUSCULAR HEMOGLOBIN CONC: 33.6 g/dL (ref 31.0–37.0)
MEAN CORPUSCULAR HEMOGLOBIN: 30.3 pg (ref 26.0–34.0)
MEAN CORPUSCULAR VOLUME: 90 fL (ref 80.0–100.0)
MEAN PLATELET VOLUME: 10.8 fL — ABNORMAL HIGH (ref 7.0–10.0)
MONOCYTES ABSOLUTE COUNT: 0.2 10*9/L (ref 0.2–0.8)
MONOCYTES RELATIVE PERCENT: 9.4 %
NEUTROPHILS ABSOLUTE COUNT: 1.1 10*9/L — ABNORMAL LOW (ref 2.0–7.5)
NEUTROPHILS RELATIVE PERCENT: 42.8 %
PLATELET COUNT: 31 10*9/L — ABNORMAL LOW (ref 150–440)
RED BLOOD CELL COUNT: 4.04 10*12/L (ref 4.00–5.20)
RED CELL DISTRIBUTION WIDTH: 17.3 % — ABNORMAL HIGH (ref 12.0–15.0)
WBC ADJUSTED: 2.5 10*9/L — ABNORMAL LOW (ref 4.5–11.0)

## 2018-03-19 LAB — POTASSIUM: Potassium:SCnc:Pt:Ser/Plas:Qn:: 3.8

## 2018-03-19 LAB — SMEAR REVIEW

## 2018-03-19 LAB — LYMPHOCYTES RELATIVE PERCENT: Lab: 41.6

## 2018-03-19 NOTE — Unmapped (Signed)
Labs drawn and sent for analysis.  Care provided by  Laveda Abbe, RN

## 2018-03-19 NOTE — Unmapped (Addendum)
Please return Friday for labs and bone marrow biopsy (and possibly granix shot if your Absolute neutrophil count is below 1) and in 2 weeks for a follow up visit and labs.    Please call us if you experience: ??  1. Nausea or vomiting not controlled by nausea medicines  2. Fever of 100.5 F or higher, shaking chills, drenching night sweats.  3. Uncontrolled pain  4. Any rapidly enlarging lymph node or mass  5. Unintentional weight loss  6. Any other concerning symptom     MyChart Messages  For your safety and best care, please DO NOT use MyChart messages to report symptoms. (Symptoms should be reported by calling the nurse triage line). Please use MyChart for non-urgent matters such as general questions, non-urgent prescription refills, or non-urgent scheduling issues.     ?? Please do not use MyChart for URGENT messages, as messages are only checked during regular business hours.     ?? Please note that MyChart messages may be routed a central pool and one of your provider???s team members will get back to you.  - Expect up to 3 business days for response     If you have any other questions, please do not hesitate to contact us.    Nurse Navigator: Frankey Poot, RN ??    For health related questions Monday through Friday 8 AM??? 5 PM : please call the office at [phone] and ask for Frankey Poot, RN, our nurse navigator.   For appointment changes call: Main Clinic [phone].  Toll free number is [phone].    On Nights, Weekends and Holidays:  Call [phone] and ask for the adult hematologist/oncologist on call.      [facility]  [address]  [place], [place] [address]  [email or web address]    Results for orders placed or performed in visit on 03/19/18   CBC w/ Differential   Result Value Ref Range    WBC 2.5 (L) 4.5 - 11.0 10*9/L    RBC 4.04 4.00 - 5.20 10*12/L    HGB 12.2 12.0 - 16.0 g/dL    HCT 24.4 01.0 - 27.2 %    MCV 90.0 80.0 - 100.0 fL    MCH 30.3 26.0 - 34.0 pg    MCHC 33.6 31.0 - 37.0 g/dL RDW 53.6 (H) 64.4 - 03.4 %    MPV 10.8 (H) 7.0 - 10.0 fL    Platelet 31 (L) 150 - 440 10*9/L    Variable HGB Concentration Slight (A) Not Present    Neutrophils % 42.8 %    Lymphocytes % 41.6 %    Monocytes % 9.4 %    Eosinophils % 1.2 %    Basophils % 0.5 %    Neutrophil Left Shift 2+ (A) Not Present    Absolute Neutrophils 1.1 (L) 2.0 - 7.5 10*9/L    Absolute Lymphocytes 1.0 (L) 1.5 - 5.0 10*9/L    Absolute Monocytes 0.2 0.2 - 0.8 10*9/L    Absolute Eosinophils 0.0 0.0 - 0.4 10*9/L    Absolute Basophils 0.0 0.0 - 0.1 10*9/L    Large Unstained Cells 5 (H) 0 - 4 %    Macrocytosis Slight (A) Not Present    Anisocytosis Slight (A) Not Present    Hypochromasia Slight (A) Not Present

## 2018-03-19 NOTE — Unmapped (Signed)
C/o pain in tailbone after fall. Pt wants to know if her taking ranitidine 150mg  BID. Has been having pain in abdomin for quite some time and getting a little bit worse above navel and below breast line.     Wants to know if it will be ok to get a cortisone injection in her R shoulder.

## 2018-03-19 NOTE — Unmapped (Signed)
[facility] Follow Up       Patient Name: Katrina Gould  Patient Age: 77 y.o.  Encounter Date: 03/19/2018      Chief complaint/Reason for visit: CLL/SLL    Assessment:  Katrina Gould is a 77 y.o. female who presents for follow of her CLL/SLL, Rai stage 4. Disease markers: 46,XX,der(4)t(4;4)(p14;q21). IGHV unmutated (0%, 3-33*01). B79m 5.05. No TP53 mutation. High-risk by CLL-IPI (6 points).    Pt with ongoing pancytopenia and family concerned that ven-obi not working - I think this is a valid concern though I noted that the low counts could also be 2/t adverse effect from treatment. Today would be her last obi cycle but we will hold on this for now. Will get a BM bx to assess disease burden - if persistent CLL then this would be consistent with non-response but if marrow empty, would be better to allow for continued recovery (may recover after obi but if counts remain low, may need to also further decrease the ven dose).    Will RTC in 1 week for labs and possible granix.  Her ANC tends to fluctuate, and would like to have her ANC be stable for several weeks without granix before stopping weekly checks.    Plans and Recommendations:  1. CLL, Rai 4   - Continue Venetoclax 200 mg  - Hold obinituzumab today  - BM bx Friday to assess disease response and help inform what to do with future dosing  - No granix today but if ANC is <1 on Friday's check, would do Granix then  - told her okay to travel to [facility] next week but advised to be aware of local hospital in event of a fever etc  - lab check with possible granix weekly but next Friday then following Tuesday 12/3    2.  Syncope  - follow up with PCP    3. Diarrhea  - continue Imodium as needed for loose stools  - trial metamucil for bulking up stools - helping    4. Pain related to falls  - continue tylenol as needed for pain relief  - may use ice/heat for additional comfort    5. Retained suture  - refer to plastics    6. Health Maintenance  - flu shot given this season  - continue annual dermatology follow up  - still needs 2nd shingrix vaccine    Due to this patient's diagnosis, she is at significant risk for subsequent morbidity and/or mortality.    Interval History:  Doing fair overall. Concerned about low counts. No new symptoms. Stool better with metamucil.    Otherwise, she denies new constitutional symptoms such as anorexia, weight loss, drenching night sweats or unexplained fevers.  Furthermore, she denies symptoms of marrow failure:  recurrent or unexplained intercurrent infections.  There have been no new or unexplained pains or self-identified masses, swelling or enlarged lymph nodes.    Past Medical, Surgical and Family History were reviewed and pertinent updates were made in the Electronic Medical Record  ??  Review of Systems:  Other than as reported above in the interim history, the other systems reviewed were unremarkable.  ??  ECOG Performance Status: 1     Oncology History:    Oncology History    CLL    First seen in oncology clinic locally 03/31/16    Had a left axillary biopsy 03/15/16 that confirmed CLL/SLL: Flow showed monoclonal B-cell population with CD5, CD23, kappa light chain. IHC showed  positive CD20 (diffuse), negative for cyclin D1.    No constitutional symptoms except some sweats    Labs: Hb 12.0, Hct 34.8, plt 147. WBC was 10.6 with ANC 2.5. ALC was 7.5K.    CLL FISH studies: normal panel. No cells with 11q, tri 12, 17p or 13q, and the 11;14 translocation also not detected.    CT c/a/p 04/04/16 showed mild LAD in chest, abd, and pelvis and spleen was at ULN (13 x 5.6 x 10.9 cm). Largest node 1.6 cm.    Of note is that she had a prior CT on 04/06/15 that showed similarly small enlarged node, largest being 1.9 cm.    CLL-IPI: As per Lancet Oncol Vol 15 October 2014  Includes (1) TP53 status (no abnormalities vs. Del 17p and/or TP53 mutation - 4 points) - no 17p (did not send TP53 mutation testing), (2) IGHV mutation status (mut vs unmut - 2 points) (pending) (3) serum B2-microglobulin (</=3.5 vs >3.5 - 2 points) (gets 2 points here) (4) clinical stage (Rai 0 vs Rai I-IV - 1 point) (gets 1 point here) and (5) age (</=65 vs >65 - 1 point)  (gets 1 point here)     Low risk (0-1 points)= 93.2% OS at 5 years  Int risk (2-3 points)= 79.3%  High risk (4-6 points)= 63.3%  - this patient has 4 points already, pending IGHV testing and prior to treatment would also need to send TP53 mutation testing  Very high risk (7-10 points)= 23.3%               CLL (chronic lymphocytic leukemia) (CMS-HCC)    05/16/2016 Initial Diagnosis     CLL (chronic lymphocytic leukemia) (RAF-HCC)        B-cell chronic lymphocytic leukemia (CMS-HCC)    09/05/2017 - 10/11/2017 Chemotherapy     Chemotherapy Treatment    Treatment Goal Control   Line of Treatment [No plan line of treatment]   Plan Name OP RITUXIMAB    Start Date 09/14/2017   End Date 10/05/2017   Provider Pernell Dupre, MD   Chemotherapy riTUXimab (RITUXAN) 712.5 mg in sodium chloride (NS) 0.9 % 500 mL IVPB, 375 mg/m2 = 712.5 mg, Intravenous, Once, 1 of 1 cycle  Administration: 712.5 mg (09/14/2017)  riTUXimab (RITUXAN) 712.5 mg in sodium chloride (NS) 0.9 % 250 mL rapid infusion, 375 mg/m2 = 712.5 mg, Intravenous, Once, 1 of 1 cycle  Administration: 712.5 mg (09/21/2017), 712.5 mg (09/28/2017), 712.5 mg (10/05/2017)         10/16/2017 Initial Diagnosis     Chronic lymphocytic leukemia, Rai stage IV (CMS-HCC)      10/23/2017 -  Chemotherapy     OP OBINUTUZUMAB AND VENETOCLAX  chlorambucil 0.5 mg/kg PO on days 1, 15, obinutuzumab 100 mg IV on day 1, obinutuzumab 900 mg IV on day 2, obinutuzumab 1,000 mg IV on days 8,15 on Cycle 1, then obinutuzumab 1,000 mg IV on day 1 for sequent cycles, every 28 days.         Allergies   Allergen Reactions   ??? Sertraline Other (See Comments)     Nervousness, panic, shortness of breath   ??? Topiramate Palpitations     Panic attacks; unable to sleep   ??? Baclofen      made me go crazy         Current Outpatient Medications   Medication Sig Dispense Refill   ??? allopurinol (ZYLOPRIM) 300 MG tablet Take 1 tablet (300 mg total) by mouth  daily. 30 tablet 2   ??? cholestyramine (QUESTRAN) 4 gram packet Take 1 packet by mouth Three (3) times a day with a meal. (Patient not taking: Reported on 02/19/2018) 90 each 12   ??? fesoterodine (TOVIAZ) 4 mg 24 hr tablet Take by mouth Take as directed.      ??? fluticasone propion-salmeterol (ADVAIR) 100-50 mcg/dose diskus Inhale.     ??? loperamide (IMODIUM A-D) 2 mg tablet Take 2 mg by mouth Three (3) times a day as needed for diarrhea.     ??? LORazepam (ATIVAN) 0.5 MG tablet Take 0.5 mg by mouth two (2) times a day as needed.      ??? mirtazapine (REMERON) 7.5 MG tablet Take 7.5 mg by mouth nightly.     ??? multivitamin (TAB-A-VITE/THERAGRAN) per tablet Take 1 tablet by mouth daily.      ??? oxyCODONE (ROXICODONE) 5 MG immediate release tablet Take 1 tablet (5 mg total) by mouth every eight (8) hours as needed for pain. 20 tablet 0   ??? pantoprazole (PROTONIX) 40 MG tablet Take by mouth daily at 0600.      ??? pravastatin (PRAVACHOL) 80 MG tablet TAKE 1 TABLET BY MOUTH EVERY NIGHT AT BEDTIME     ??? predniSONE (DELTASONE) 10 MG tablet 6 TABS X 1 DAY, 5 TABS X 1 DAY, 4 TABS X 1 DAY, ETC...  0   ??? ranitidine (ZANTAC) 150 MG capsule Take 1 capsule (150 mg total) by mouth Two (2) times a day. 180 capsule 3   ??? sertraline (ZOLOFT) 50 MG tablet Take 50 mg by mouth daily.  1   ??? topiramate (TOPAMAX) 50 MG tablet Take 50 mg by mouth daily.  3   ??? venetoclax (VENCLEXTA) 100 mg tablet Take 2 tablets (200 mg total) by mouth daily. 60 each 6     No current facility-administered medications for this visit.      Physical exam:  There were no vitals filed for this visit.  Constitutional: Resting, in no apparent distress  Eyes:  No scleral icterus or conjunctival injection.  Ear/nose/mouth/throat: Oral mucosa without ulceration, erythema or exudate.   Hematology/lymphatic/immunologic:  No lymphadenopathy in the anterior/posterior cervical, supraclavicular basins.  Cardiovascular:  RRR.  S1, S2.  No murmurs, gallops or rubs. Appear well-perfused.  No clubbing or cyanosis. 1+ edema to bilateral ankles and feet.  Respiratory:  Breathing is unlabored, and patient is speaking full sentences with ease.  No stridor.  CTAB. No rales, ronchi or crackles.     GI:  No distention. Bowel sounds are present and normal in quality.  Mild pain on palpation of right upper quadrant. No palpable hepatomegaly or splenomegaly.  No palpable masses.   Musculoskeletal: . No pain on palpation of the spinous processes of the cervical, thoracic or lumbar vertebral bodies. No grossly-evident joint effusions or deformities.  Range of motion about the shoulder, elbow, hips and knees is grossly normal.    Skin:  No rashes or areas of skin breakdown.  Warm to touch, dry, smooth and even.  Neurologic:  Gait is normal.  Cerebellar tasks are completed with ease and are symmetric.  Psychiatric:  Alert and oriented to person, place, time and situation.  Range of affect is appropriate.      ECOG Performance Status: 1    Results:  Labs and pathology have been reviewed and pertinent results are as follows:    Results for orders placed or performed in visit on 03/19/18   Comprehensive Metabolic Panel  Result Value Ref Range    Sodium 139 135 - 145 mmol/L    Potassium 3.8 3.5 - 5.0 mmol/L    Chloride 108 (H) 98 - 107 mmol/L    CO2 22.0 22.0 - 30.0 mmol/L    BUN 14 7 - 21 mg/dL    Creatinine 1.61 0.96 - 1.00 mg/dL    BUN/Creatinine Ratio 16     EGFR CKD-EPI Non-African American, Female 64 >=60 mL/min/1.67m2    EGFR CKD-EPI African American, Female 36 >=60 mL/min/1.55m2    Glucose 108 65 - 179 mg/dL    Calcium 9.2 8.5 - 04.5 mg/dL    Albumin 4.0 3.5 - 5.0 g/dL    Total Protein 6.5 6.5 - 8.3 g/dL    Total Bilirubin 0.6 0.0 - 1.2 mg/dL    AST 24 14 - 38 U/L    ALT 14 <35 U/L    Alkaline Phosphatase 85 38 - 126 U/L    Anion Gap 9 7 - 15 mmol/L CBC w/ Differential   Result Value Ref Range    WBC 2.5 (L) 4.5 - 11.0 10*9/L    RBC 4.04 4.00 - 5.20 10*12/L    HGB 12.2 12.0 - 16.0 g/dL    HCT 40.9 81.1 - 91.4 %    MCV 90.0 80.0 - 100.0 fL    MCH 30.3 26.0 - 34.0 pg    MCHC 33.6 31.0 - 37.0 g/dL    RDW 78.2 (H) 95.6 - 15.0 %    MPV 10.8 (H) 7.0 - 10.0 fL    Platelet 31 (L) 150 - 440 10*9/L    Variable HGB Concentration Slight (A) Not Present    Neutrophils % 42.8 %    Lymphocytes % 41.6 %    Monocytes % 9.4 %    Eosinophils % 1.2 %    Basophils % 0.5 %    Neutrophil Left Shift 2+ (A) Not Present    Absolute Neutrophils 1.1 (L) 2.0 - 7.5 10*9/L    Absolute Lymphocytes 1.0 (L) 1.5 - 5.0 10*9/L    Absolute Monocytes 0.2 0.2 - 0.8 10*9/L    Absolute Eosinophils 0.0 0.0 - 0.4 10*9/L    Absolute Basophils 0.0 0.0 - 0.1 10*9/L    Large Unstained Cells 5 (H) 0 - 4 %    Macrocytosis Slight (A) Not Present    Anisocytosis Slight (A) Not Present    Hypochromasia Slight (A) Not Present

## 2018-03-20 NOTE — Unmapped (Signed)
Sorry, I sent this to the pool out of force of habit.

## 2018-03-20 NOTE — Unmapped (Signed)
Hi Nurse Daguerre,     Leanord Hawking contacted the [facility] requesting to speak with the care team of [name] to discuss:    [name] informed me that you have requested information about an ambulance bill. She tells me the fee is $881.20.    Please contact Ms. Ausley at [phone].    Program: Heme Malignancy  Speciality: Medical Oncology    Check Indicates criteria has been reviewed and confirmed with the patient:    []  Preferred Name   [x]  DOB and/or MR#  [x]  Preferred Contact Method  [x]  Phone Number(s)   [x]  MyChart     Thank you,   Kelli Hope  [facility]   [phone]

## 2018-03-21 NOTE — Unmapped (Signed)
She said she is coming tomorrow for BMBX and if she has the paperwork by then, she would bring it. If not, she said she would mail it to me because she does not have an email address.

## 2018-03-21 NOTE — Unmapped (Signed)
Hi KJ,    I do not have access to hospital bills. I called the [phone] number but it just kept running me thru options as if I am a patient or needing to connect with financial counseling. I'll call to see if she can email it to me. Thanks, [employer]

## 2018-03-22 ENCOUNTER — Ambulatory Visit: Admit: 2018-03-22 | Discharge: 2018-03-23 | Payer: MEDICARE | Attending: Adult Health | Primary: Adult Health

## 2018-03-22 ENCOUNTER — Other Ambulatory Visit: Admit: 2018-03-22 | Discharge: 2018-03-23 | Payer: MEDICARE

## 2018-03-22 DIAGNOSIS — C9110 Chronic lymphocytic leukemia of B-cell type not having achieved remission: Secondary | ICD-10-CM

## 2018-03-22 DIAGNOSIS — T451X5A Adverse effect of antineoplastic and immunosuppressive drugs, initial encounter: Secondary | ICD-10-CM

## 2018-03-22 DIAGNOSIS — D701 Agranulocytosis secondary to cancer chemotherapy: Secondary | ICD-10-CM

## 2018-03-22 DIAGNOSIS — F418 Other specified anxiety disorders: Secondary | ICD-10-CM

## 2018-03-22 LAB — COMPREHENSIVE METABOLIC PANEL
ALBUMIN: 3.8 g/dL (ref 3.5–5.0)
ALKALINE PHOSPHATASE: 89 U/L (ref 38–126)
ALT (SGPT): 12 U/L (ref <35)
ANION GAP: 9 mmol/L (ref 7–15)
AST (SGOT): 23 U/L (ref 14–38)
BILIRUBIN TOTAL: 0.5 mg/dL (ref 0.0–1.2)
BLOOD UREA NITROGEN: 16 mg/dL (ref 7–21)
BUN / CREAT RATIO: 17
CALCIUM: 9 mg/dL (ref 8.5–10.2)
CHLORIDE: 107 mmol/L (ref 98–107)
CO2: 22 mmol/L (ref 22.0–30.0)
CREATININE: 0.93 mg/dL (ref 0.60–1.00)
EGFR CKD-EPI AA FEMALE: 68 mL/min/{1.73_m2} (ref >=60)
EGFR CKD-EPI NON-AA FEMALE: 59 mL/min/{1.73_m2} — ABNORMAL LOW (ref >=60)
GLUCOSE RANDOM: 98 mg/dL (ref 65–179)
POTASSIUM: 3.9 mmol/L (ref 3.5–5.0)
PROTEIN TOTAL: 6.1 g/dL — ABNORMAL LOW (ref 6.5–8.3)

## 2018-03-22 LAB — SMEAR REVIEW

## 2018-03-22 LAB — CBC W/ AUTO DIFF
BASOPHILS ABSOLUTE COUNT: 0 10*9/L (ref 0.0–0.1)
BASOPHILS RELATIVE PERCENT: 0.3 %
EOSINOPHILS ABSOLUTE COUNT: 0 10*9/L (ref 0.0–0.4)
EOSINOPHILS RELATIVE PERCENT: 0.3 %
HEMATOCRIT: 34 % — ABNORMAL LOW (ref 36.0–46.0)
HEMOGLOBIN: 11.6 g/dL — ABNORMAL LOW (ref 12.0–16.0)
LARGE UNSTAINED CELLS: 4 % (ref 0–4)
LYMPHOCYTES ABSOLUTE COUNT: 0.9 10*9/L — ABNORMAL LOW (ref 1.5–5.0)
LYMPHOCYTES RELATIVE PERCENT: 40.2 %
MEAN CORPUSCULAR HEMOGLOBIN: 30.7 pg (ref 26.0–34.0)
MEAN CORPUSCULAR VOLUME: 90.2 fL (ref 80.0–100.0)
MEAN PLATELET VOLUME: 10.1 fL — ABNORMAL HIGH (ref 7.0–10.0)
MONOCYTES ABSOLUTE COUNT: 0.2 10*9/L (ref 0.2–0.8)
MONOCYTES RELATIVE PERCENT: 7.4 %
NEUTROPHILS ABSOLUTE COUNT: 1 10*9/L — ABNORMAL LOW (ref 2.0–7.5)
NEUTROPHILS RELATIVE PERCENT: 48.3 %
PLATELET COUNT: 27 10*9/L — ABNORMAL LOW (ref 150–440)
RED BLOOD CELL COUNT: 3.78 10*12/L — ABNORMAL LOW (ref 4.00–5.20)
RED CELL DISTRIBUTION WIDTH: 17.6 % — ABNORMAL HIGH (ref 12.0–15.0)
WBC ADJUSTED: 2.1 10*9/L — ABNORMAL LOW (ref 4.5–11.0)

## 2018-03-22 LAB — PHOSPHORUS
PHOSPHORUS: 4.3 mg/dL (ref 2.9–4.7)
Phosphate:MCnc:Pt:Ser/Plas:Qn:: 4.3

## 2018-03-22 LAB — RED BLOOD CELL COUNT: Lab: 3.78 — ABNORMAL LOW

## 2018-03-22 LAB — SLIDE REVIEW

## 2018-03-22 LAB — URIC ACID: Urate:MCnc:Pt:Ser/Plas:Qn:: 3.1

## 2018-03-22 LAB — ALBUMIN: Albumin:MCnc:Pt:Ser/Plas:Qn:: 3.8

## 2018-03-22 NOTE — Unmapped (Signed)
Date of Service: 03/22/2018      Patient Active Problem List   Diagnosis   ??? CLL (chronic lymphocytic leukemia) (CMS-HCC)   ??? Chronic migraine without aura   ??? 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 Left bundle branch block (LBBB)   ??? Hypercholesterolemia   ??? Chest pain, unspecified   ??? GERD (gastroesophageal reflux disease)   ??? Osteoporosis   ??? Needs flu shot   ??? BRBPR (bright red blood per rectum)   ??? Other autoimmune hemolytic anemias (CMS-HCC)    ??? B-cell chronic lymphocytic leukemia (CMS-HCC)   ??? Acute diarrhea   ??? Aortic atherosclerosis (CMS-HCC)   ??? Diverticulitis of colon   ??? Epigastric pain   ??? Gastritis, Helicobacter pylori   ??? H/O adenomatous polyp of colon   ??? Healthcare maintenance   ??? LLQ pain   ??? Lymphadenopathy   ??? Migraine without aura and without status migrainosus, not intractable   ??? Syncope and collapse   ??? White matter disease   ??? Vasovagal syncope   ??? Chemotherapy-induced neutropenia (CMS-HCC)   ??? Coccyx pain       Indication:    Diagnosis ICD-10-CM Associated Orders   1. Situational anxiety F41.8 LORazepam (ATIVAN) injection 0.5 mg   2. CLL (chronic lymphocytic leukemia) (CMS-HCC) C91.10 Cytogenetics Cancer/FISH NON-BLOOD     Cytogenetics Cancer/FISH NON-BLOOD     Hematopathology Order     Hematopathology Order     Referral Laboratory Test, Other     Referral Laboratory Test, Other   3. Chemotherapy-induced neutropenia (CMS-HCC) D70.1 tbo-filgrastim (GRANIX) injection 480 mcg    T45.1X5A        Premedication: Ativan 0.5mg  IV    Ordering Provider: Salli Real, MD    Clinician(s) Performing Procedure:  Markus Jarvis, AGNP        Bone Marrow Aspirate and Biopsy, right side    The procedure risks and alternatives of the procedure were explained to the patient.  The patient verbalized understanding and signed informed consent. After a time-out in which his patient identifiers were checked by 2 providers, the patient was laid in prone position on the table.   The posterior superior iliac spine and iliac crest were cleaned, prepped and draped in the usual sterile fashion.     Anesthetic agent used: 2% plain lidocaine.      Utilizing a Ranfac needle, a bone marrow aspiration and biopsy was performed.  Specimen was sent for routine histopathologic stains and sectioning, flow cytometry and cytogenetics.     A pressure dressing was applied to the biopsy site.  Patient tolerated the procedure well.  Hemostasis was confirmed upon discharge.     The patient was given verbal instructions for wound care, such as to keep the biopsy site dry, covered for 24 hours, and to call your physician for a temperature > 100.5.  Tylenol may be taken for discomfort.    Specimens Collected:  EDTA x 2  Heparin x 1  ACD x 1  Core biopsy x 1

## 2018-03-22 NOTE — Unmapped (Signed)
Pt tolerated procedure without difficulty & administered Granix.  Understands to follow up with provider as recommended. Pt left infusion center via wheelchair with family member escorting. NAD, no issues voiced at time of d/c. Port flushed then de-accessed prior to d/c.

## 2018-03-22 NOTE — Unmapped (Signed)
Please leave dressing in place and keep it dry for 24 hrs before removing. You can resume normal activities tomorrow, but take things easy today.     You may take tylenol as needed for discomfort at the area where the biopsy was taken.     After receiving IV Ativan, please do not drive today.     If you have questions please call [phone] and speak to the nurse navigator after hours:   For emergencies, evenings or weekends, please call [phone] and ask for oncology fellow on call.     Reasons to call emergency line may include:   Fever of 100.5 or greater   Nausea and/or vomiting not relieved with nausea medicine   Diarrhea or constipation   Severe pain not relieved with usual pain regimen     Lab on 03/22/2018   Component Date Value Ref Range Status   ??? WBC 03/22/2018 2.1* 4.5 - 11.0 10*9/L Final   ??? RBC 03/22/2018 3.78* 4.00 - 5.20 10*12/L Final   ??? HGB 03/22/2018 11.6* 12.0 - 16.0 g/dL Final   ??? HCT 43/32/9518 34.0* 36.0 - 46.0 % Final   ??? MCV 03/22/2018 90.2  80.0 - 100.0 fL Final   ??? MCH 03/22/2018 30.7  26.0 - 34.0 pg Final   ??? MCHC 03/22/2018 34.0  31.0 - 37.0 g/dL Final   ??? RDW 84/16/6063 17.6* 12.0 - 15.0 % Final   ??? MPV 03/22/2018 10.1* 7.0 - 10.0 fL Final   ??? Platelet 03/22/2018 27* 150 - 440 10*9/L Final   ??? Variable HGB Concentration 03/22/2018 Slight* Not Present Final   ??? Neutrophils % 03/22/2018 48.3  % Final   ??? Lymphocytes % 03/22/2018 40.2  % Final   ??? Monocytes % 03/22/2018 7.4  % Final   ??? Eosinophils % 03/22/2018 0.3  % Final   ??? Basophils % 03/22/2018 0.3  % Final   ??? Neutrophil Left Shift 03/22/2018 2+* Not Present Final   ??? Absolute Neutrophils 03/22/2018 1.0* 2.0 - 7.5 10*9/L Final   ??? Absolute Lymphocytes 03/22/2018 0.9* 1.5 - 5.0 10*9/L Final   ??? Absolute Monocytes 03/22/2018 0.2  0.2 - 0.8 10*9/L Final   ??? Absolute Eosinophils 03/22/2018 0.0  0.0 - 0.4 10*9/L Final   ??? Absolute Basophils 03/22/2018 0.0  0.0 - 0.1 10*9/L Final   ??? Large Unstained Cells 03/22/2018 4  0 - 4 % Final ??? Macrocytosis 03/22/2018 Slight* Not Present Final   ??? Anisocytosis 03/22/2018 Slight* Not Present Final   ??? Hypochromasia 03/22/2018 Moderate* Not Present Final

## 2018-03-22 NOTE — Unmapped (Signed)
Labs drawn and sent for analysis.  Care provided by  K Lopez, RN

## 2018-03-26 ENCOUNTER — Ambulatory Visit: Admit: 2018-03-26 | Discharge: 2018-03-27 | Payer: MEDICARE

## 2018-03-26 DIAGNOSIS — C9110 Chronic lymphocytic leukemia of B-cell type not having achieved remission: Principal | ICD-10-CM

## 2018-03-26 DIAGNOSIS — T451X5A Adverse effect of antineoplastic and immunosuppressive drugs, initial encounter: Secondary | ICD-10-CM

## 2018-03-26 DIAGNOSIS — D701 Agranulocytosis secondary to cancer chemotherapy: Secondary | ICD-10-CM

## 2018-03-26 LAB — CBC W/ AUTO DIFF
BASOPHILS ABSOLUTE COUNT: 0 10*9/L (ref 0.0–0.1)
BASOPHILS RELATIVE PERCENT: 0.4 %
EOSINOPHILS ABSOLUTE COUNT: 0 10*9/L (ref 0.0–0.4)
EOSINOPHILS RELATIVE PERCENT: 0.7 %
HEMATOCRIT: 35 % — ABNORMAL LOW (ref 36.0–46.0)
HEMOGLOBIN: 11.9 g/dL — ABNORMAL LOW (ref 13.5–16.0)
LARGE UNSTAINED CELLS: 3 % (ref 0–4)
LYMPHOCYTES ABSOLUTE COUNT: 1 10*9/L — ABNORMAL LOW (ref 1.5–5.0)
LYMPHOCYTES RELATIVE PERCENT: 42.6 %
MEAN CORPUSCULAR HEMOGLOBIN CONC: 34 g/dL (ref 31.0–37.0)
MEAN CORPUSCULAR HEMOGLOBIN: 31 pg (ref 26.0–34.0)
MEAN CORPUSCULAR VOLUME: 91 fL (ref 80.0–100.0)
MEAN PLATELET VOLUME: 9.9 fL (ref 7.0–10.0)
MONOCYTES ABSOLUTE COUNT: 0.2 10*9/L (ref 0.2–0.8)
MONOCYTES RELATIVE PERCENT: 9.2 %
NEUTROPHILS ABSOLUTE COUNT: 1 10*9/L — ABNORMAL LOW (ref 2.0–7.5)
PLATELET COUNT: 27 10*9/L — ABNORMAL LOW (ref 150–440)
WBC ADJUSTED: 2.3 10*9/L — ABNORMAL LOW (ref 4.5–11.0)

## 2018-03-26 LAB — SENDOUT TEST NAME

## 2018-03-26 LAB — HEMATOCRIT: Lab: 35 — ABNORMAL LOW

## 2018-03-26 NOTE — Unmapped (Signed)
Patient came in for lab draw with possible transfusion and/ or Granix inj. Accessed port and drew labs, Hbg 11.9, Hct 35.0, plat 27, and ANC 1.0.  Per plan patient does not require a transfusion but does nee her Granix injection. De accessed port Hep flushed. Gave granix right lower abd. Patient ambulated out of clinic.

## 2018-03-26 NOTE — Unmapped (Signed)
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 Cont low plts likely AE from tx vs unrelated to CLL (plts low at baseline). For now will stop obi - and if plts still low in another month may consider further dose reducing ven which can also cause low plts. (she is curr at 200 mg daily dose)

## 2018-03-26 NOTE — Unmapped (Signed)
Katrina Gould contacted the [employer] requesting results of the following:     Procedure: Bone Marrow Biopsy  Completed On: 03/22/2018  Program: Heme Malignancy  Speciality: Medical Oncology    Please contact [name] at [phone] for proper follow up.    Check Indicates criteria has been reviewed and confirmed with the patient:    []  Preferred Name   [x]  DOB and/or MR#  [x]  Preferred Contact Method  [x]  Phone Number(s)   []  MyChart     Thank you,   Vernie Ammons  [facility]   [phone]

## 2018-03-26 NOTE — Unmapped (Signed)
[facility] Triage Note     Patient: Katrina Gould     Reason for call:  requesting results of BMBX    Time call returned: 1404     Phone Assessment: returned call to pt. Pt asking for results of BMBX.      Triage Recommendations: told pt I would notify team     Patient Response: verbalized understanding     Outstanding tasks: call pt with BMBX results   Patient Pharmacy has been verified and primary pharmacy has been marked as preferred

## 2018-04-01 NOTE — Unmapped (Signed)
[facility] Refill Coordination Note    Specialty Medication(s) to be Shipped:   Hematology/Oncology: Katrina Gould, DOB: 1940/12/12  Phone: [phone] (home) [phone] (work)      All above HIPAA information was verified with patient.     Completed refill call assessment today to schedule patient's medication shipment from the [facility] [phone]).       Specialty medication(s) and dose(s) confirmed: Regimen is correct and unchanged.   Changes to medications: [name] reports no changes reported at this time.  Changes to insurance: No  Questions for the pharmacist: No    The patient will receive a drug information handout for each medication shipped and additional FDA Medication Guides as required.      DISEASE/MEDICATION-SPECIFIC INFORMATION        N/A    ADHERENCE     Medication Adherence    Patient reported X missed doses in the last month:  0  Specialty Medication:  VENCLEXTA 100 mg  Patient is on additional specialty medications:  No  Patient is on more than two specialty medications:  No  Any gaps in refill history greater than 2 weeks in the last 3 months:  no  Demonstrates understanding of importance of adherence:  yes  Informant:  patient  Reliability of informant:  reliable              Confirmed plan for next specialty medication refill:  delivery by pharmacy          Refill Coordination    Has the Patients' Contact Information Changed:  No  Is the Shipping Address Different:  No         MEDICARE PART B DOCUMENTATION     VENCLEXTA 100 mg: Patient has 2 days worth on hand.    SHIPPING     Shipping address confirmed in Epic.     Delivery Scheduled: Yes, Expected medication delivery date: 04/03/2018 via UPS or courier.     Medication will be delivered via UPS to the home address in Epic WAM.    Katrina Gould   [facility] Specialty Technician

## 2018-04-02 ENCOUNTER — Ambulatory Visit: Admit: 2018-04-02 | Discharge: 2018-04-02 | Payer: MEDICARE

## 2018-04-02 ENCOUNTER — Ambulatory Visit: Admit: 2018-04-02 | Discharge: 2018-04-02 | Payer: MEDICARE | Attending: Adult Health | Primary: Adult Health

## 2018-04-02 ENCOUNTER — Other Ambulatory Visit: Admit: 2018-04-02 | Discharge: 2018-04-02 | Payer: MEDICARE

## 2018-04-02 DIAGNOSIS — C9110 Chronic lymphocytic leukemia of B-cell type not having achieved remission: Secondary | ICD-10-CM

## 2018-04-02 DIAGNOSIS — K521 Toxic gastroenteritis and colitis: Secondary | ICD-10-CM

## 2018-04-02 DIAGNOSIS — D701 Agranulocytosis secondary to cancer chemotherapy: Secondary | ICD-10-CM

## 2018-04-02 DIAGNOSIS — M25511 Pain in right shoulder: Secondary | ICD-10-CM

## 2018-04-02 DIAGNOSIS — L309 Dermatitis, unspecified: Secondary | ICD-10-CM

## 2018-04-02 DIAGNOSIS — T451X5A Adverse effect of antineoplastic and immunosuppressive drugs, initial encounter: Secondary | ICD-10-CM

## 2018-04-02 DIAGNOSIS — G8929 Other chronic pain: Secondary | ICD-10-CM

## 2018-04-02 DIAGNOSIS — Z0000 Encounter for general adult medical examination without abnormal findings: Secondary | ICD-10-CM

## 2018-04-02 LAB — COMPREHENSIVE METABOLIC PANEL
ALBUMIN: 3.6 g/dL (ref 3.5–5.0)
ALKALINE PHOSPHATASE: 79 U/L (ref 38–126)
ALT (SGPT): 13 U/L (ref <35)
ANION GAP: 7 mmol/L (ref 7–15)
AST (SGOT): 28 U/L (ref 14–38)
BILIRUBIN TOTAL: 0.5 mg/dL (ref 0.0–1.2)
BLOOD UREA NITROGEN: 11 mg/dL (ref 7–21)
BUN / CREAT RATIO: 14
CALCIUM: 9.1 mg/dL (ref 8.5–10.2)
CHLORIDE: 109 mmol/L — ABNORMAL HIGH (ref 98–107)
CO2: 23 mmol/L (ref 22.0–30.0)
EGFR CKD-EPI AA FEMALE: 81 mL/min/{1.73_m2} (ref >=60)
EGFR CKD-EPI NON-AA FEMALE: 70 mL/min/{1.73_m2} (ref >=60)
POTASSIUM: 3.7 mmol/L (ref 3.5–5.0)
PROTEIN TOTAL: 6.2 g/dL — ABNORMAL LOW (ref 6.5–8.3)
SODIUM: 139 mmol/L (ref 135–145)

## 2018-04-02 LAB — CBC W/ AUTO DIFF
BASOPHILS ABSOLUTE COUNT: 0 10*9/L (ref 0.0–0.1)
BASOPHILS RELATIVE PERCENT: 0.2 %
EOSINOPHILS ABSOLUTE COUNT: 0 10*9/L (ref 0.0–0.4)
HEMOGLOBIN: 11.9 g/dL — ABNORMAL LOW (ref 12.0–16.0)
LARGE UNSTAINED CELLS: 3 % (ref 0–4)
LYMPHOCYTES ABSOLUTE COUNT: 0.7 10*9/L — ABNORMAL LOW (ref 1.5–5.0)
LYMPHOCYTES RELATIVE PERCENT: 29.3 %
MEAN CORPUSCULAR HEMOGLOBIN CONC: 33.7 g/dL (ref 31.0–37.0)
MEAN CORPUSCULAR HEMOGLOBIN: 30.3 pg (ref 26.0–34.0)
MEAN CORPUSCULAR VOLUME: 89.8 fL (ref 80.0–100.0)
MEAN PLATELET VOLUME: 10.5 fL — ABNORMAL HIGH (ref 7.0–10.0)
MONOCYTES ABSOLUTE COUNT: 0.1 10*9/L — ABNORMAL LOW (ref 0.2–0.8)
MONOCYTES RELATIVE PERCENT: 3.9 %
NEUTROPHILS ABSOLUTE COUNT: 1.5 10*9/L — ABNORMAL LOW (ref 2.0–7.5)
NEUTROPHILS RELATIVE PERCENT: 63.1 %
RED BLOOD CELL COUNT: 3.93 10*12/L — ABNORMAL LOW (ref 4.00–5.20)
RED CELL DISTRIBUTION WIDTH: 18.2 % — ABNORMAL HIGH (ref 12.0–15.0)
WBC ADJUSTED: 2.4 10*9/L — ABNORMAL LOW (ref 4.5–11.0)

## 2018-04-02 LAB — PHOSPHORUS: Phosphate:MCnc:Pt:Ser/Plas:Qn:: 4.2

## 2018-04-02 LAB — URIC ACID: Urate:MCnc:Pt:Ser/Plas:Qn:: 3.2

## 2018-04-02 LAB — PROTEIN TOTAL: Protein:MCnc:Pt:Ser/Plas:Qn:: 6.2 — ABNORMAL LOW

## 2018-04-02 LAB — SLIDE REVIEW

## 2018-04-02 LAB — RED CELL DISTRIBUTION WIDTH: Lab: 18.2 — ABNORMAL HIGH

## 2018-04-02 LAB — TOXIC GRANULATION

## 2018-04-02 MED ORDER — TRIAMCINOLONE ACETONIDE 0.1 % TOPICAL CREAM
Freq: Two times a day (BID) | TOPICAL | 1 refills | 0 days | Status: CP
Start: 2018-04-02 — End: 2018-05-07

## 2018-04-02 MED FILL — VENCLEXTA 100 MG TABLET: ORAL | 30 days supply | Qty: 60 | Fill #1

## 2018-04-02 MED FILL — VENCLEXTA 100 MG TABLET: 30 days supply | Qty: 60 | Fill #1 | Status: AC

## 2018-04-02 NOTE — Unmapped (Signed)
[facility] Follow Up       Patient Name: Katrina Gould  Patient Age: 77 y.o.  Encounter Date: 04/02/2018      Chief complaint/Reason for visit: CLL/SLL    Assessment:  Katrina Gould is a 77 y.o. female who presents for follow of her CLL/SLL, Rai stage 4. Disease markers: 46,XX,der(4)t(4;4)(p14;q21). IGHV unmutated (0%, 3-33*01). B67m 5.05. No TP53 mutation. High-risk by CLL-IPI (6 points).    Patient had a BM bx on 11/22, which showed very low levels of CLL, indicating a good response to treatment. She is very relieved about this. Obinituzumab still on hold. She continues to take Venetoclax 200 mg as ordered and has not missed any doses. Counts are somewhat improved today, WBC 2.4 with ANC 1.5. No need for granix at this time.     Diarrhea has returned despite Metamucil. Advised patient to resume imodium as needed to control her symptoms. She will also try the metamucil capsules rather than powder, as she thinks that this may be helpful.    Recommend that patient follow up with her orthopedic doctor in [facility] for management of her shoulder pain/injections rather than wait for a referral from [place].     Will RTC in 1 week for labs and possible granix.  Her ANC tends to fluctuate, and would like to have her ANC be stable for several weeks without granix before stopping weekly checks.    Plans and Recommendations:  1. CLL, Rai 4   - Continue Venetoclax 200 mg  - Continue to hold obinituzumab  - Repeat labs weekly for the next three weeks, and then RTC in four weeks (12/31) for labs and clinic visit    2. Diarrhea  - restart Imodium PRN for loose stools  - may try metamucil capsules instead of powder to see if this offers any benefit    3. Dermatitis  - triamcinolone cream for pruritis and inflammation  - patient may want to consider hiring exterminator to eliminate any fleas that may remain    4. Pain related to falls  - continue tylenol, ice, or heat as needed for pain relief  - follow up with orthopedic doctor in [place] to schedule appt for cortisone injection    5. Health Maintenance  - flu shot given this season  - continue annual dermatology follow up  - 1st Shingrix vaccine was given 06/2017; patient will need second vaccine, but recommend that she get this done at a pharmacy or at her PCP's office for insurance purposes    Due to this patient's diagnosis, she is at significant risk for subsequent morbidity and/or mortality.    Markus Jarvis, AGPCNP-C, MSN, OCN  Nurse Practitioner  Hematologic Malignancies  [facility]  [phone] (phone)  [phone] (fax)  [email or web address]          Interval History:  Since being seen last, Katrina Gould has felt well overall. She reports that her diarrhea has returned despite taking Metamucil, though she had also stopped taking her Imodium. Her fatigue is stable, and she was recently able to participate in a family trip to [facility]. No c/o n/v. Appetite has improved somewhat, and she is eating and drinking well. No missed doses of Venetoclax. No recent episodes of vertigo or syncope.    She endorses ongoing right shoulder pain that has been persistent since her fall in September. She has gotten cortisone shots for this in the past and would like to repeat this as soon as possible.  She has also developed what she believes to be flea bites from her grand daughters dog, which is very pruritic and irritated.     Katrina Gould has also had one episode this morning of coughing up green phlegm but reports no other cold symptoms.    Otherwise, she denies new constitutional symptoms such as anorexia, weight loss, drenching night sweats or unexplained fevers.  Furthermore, she denies symptoms of marrow failure:  recurrent or unexplained intercurrent infections.  There have been no new or unexplained pains or self-identified masses, swelling or enlarged lymph nodes.    Past Medical, Surgical and Family History were reviewed and pertinent updates were made in the Electronic Medical Record  ??  Review of Systems:  Other than as reported above in the interim history, the other systems reviewed were unremarkable.  ??  ECOG Performance Status: 1     Oncology History:    Oncology History    CLL    First seen in oncology clinic locally 03/31/16    Had a left axillary biopsy 03/15/16 that confirmed CLL/SLL: Flow showed monoclonal B-cell population with CD5, CD23, kappa light chain. IHC showed positive CD20 (diffuse), negative for cyclin D1.    No constitutional symptoms except some sweats    Labs: Hb 12.0, Hct 34.8, plt 147. WBC was 10.6 with ANC 2.5. ALC was 7.5K.    CLL FISH studies: normal panel. No cells with 11q, tri 12, 17p or 13q, and the 11;14 translocation also not detected.    CT c/a/p 04/04/16 showed mild LAD in chest, abd, and pelvis and spleen was at ULN (13 x 5.6 x 10.9 cm). Largest node 1.6 cm.    Of note is that she had a prior CT on 04/06/15 that showed similarly small enlarged node, largest being 1.9 cm.    CLL-IPI: As per Lancet Oncol Vol 15 October 2014  Includes (1) TP53 status (no abnormalities vs. Del 17p and/or TP53 mutation - 4 points) - no 17p (did not send TP53 mutation testing), (2) IGHV mutation status (mut vs unmut - 2 points) (pending) (3) serum B2-microglobulin (</=3.5 vs >3.5 - 2 points) (gets 2 points here) (4) clinical stage (Rai 0 vs Rai I-IV - 1 point) (gets 1 point here) and (5) age (</=65 vs >65 - 1 point)  (gets 1 point here)     Low risk (0-1 points)= 93.2% OS at 5 years  Int risk (2-3 points)= 79.3%  High risk (4-6 points)= 63.3%  - this patient has 4 points already, pending IGHV testing and prior to treatment would also need to send TP53 mutation testing  Very high risk (7-10 points)= 23.3%               CLL (chronic lymphocytic leukemia) (CMS-HCC)    05/16/2016 Initial Diagnosis     CLL (chronic lymphocytic leukemia) (RAF-HCC)        B-cell chronic lymphocytic leukemia (CMS-HCC)    09/05/2017 - 10/11/2017 Chemotherapy     Chemotherapy Treatment    Treatment Goal Control   Line of Treatment [No plan line of treatment]   Plan Name OP RITUXIMAB    Start Date 09/14/2017   End Date 10/05/2017   Provider Pernell Dupre, MD   Chemotherapy riTUXimab (RITUXAN) 712.5 mg in sodium chloride (NS) 0.9 % 500 mL IVPB, 375 mg/m2 = 712.5 mg, Intravenous, Once, 1 of 1 cycle  Administration: 712.5 mg (09/14/2017)  riTUXimab (RITUXAN) 712.5 mg in sodium chloride (NS) 0.9 % 250 mL rapid infusion,  375 mg/m2 = 712.5 mg, Intravenous, Once, 1 of 1 cycle  Administration: 712.5 mg (09/21/2017), 712.5 mg (09/28/2017), 712.5 mg (10/05/2017)         10/16/2017 Initial Diagnosis     Chronic lymphocytic leukemia, Rai stage IV (CMS-HCC)      10/23/2017 -  Chemotherapy     OP OBINUTUZUMAB AND VENETOCLAX  chlorambucil 0.5 mg/kg PO on days 1, 15, obinutuzumab 100 mg IV on day 1, obinutuzumab 900 mg IV on day 2, obinutuzumab 1,000 mg IV on days 8,15 on Cycle 1, then obinutuzumab 1,000 mg IV on day 1 for sequent cycles, every 28 days.         Allergies   Allergen Reactions   ??? Sertraline Other (See Comments)     Nervousness, panic, shortness of breath   ??? Topiramate Palpitations     Panic attacks; unable to sleep   ??? Baclofen      made me go crazy         Current Outpatient Medications   Medication Sig Dispense Refill   ??? allopurinol (ZYLOPRIM) 300 MG tablet Take 1 tablet (300 mg total) by mouth daily. 30 tablet 2   ??? cholestyramine (QUESTRAN) 4 gram packet Take 1 packet by mouth Three (3) times a day with a meal. (Patient not taking: Reported on 02/19/2018) 90 each 12   ??? fesoterodine (TOVIAZ) 4 mg 24 hr tablet Take by mouth Take as directed.      ??? fluticasone propion-salmeterol (ADVAIR) 100-50 mcg/dose diskus Inhale.     ??? loperamide (IMODIUM A-D) 2 mg tablet Take 2 mg by mouth Three (3) times a day as needed for diarrhea.     ??? LORazepam (ATIVAN) 0.5 MG tablet Take 0.5 mg by mouth two (2) times a day as needed.      ??? mirtazapine (REMERON) 7.5 MG tablet Take 7.5 mg by mouth nightly.     ??? multivitamin (TAB-A-VITE/THERAGRAN) per tablet Take 1 tablet by mouth daily.      ??? oxyCODONE (ROXICODONE) 5 MG immediate release tablet Take 1 tablet (5 mg total) by mouth every eight (8) hours as needed for pain. 20 tablet 0   ??? pantoprazole (PROTONIX) 40 MG tablet Take by mouth daily at 0600.      ??? pravastatin (PRAVACHOL) 80 MG tablet TAKE 1 TABLET BY MOUTH EVERY NIGHT AT BEDTIME     ??? predniSONE (DELTASONE) 10 MG tablet 6 TABS X 1 DAY, 5 TABS X 1 DAY, 4 TABS X 1 DAY, ETC...  0   ??? ranitidine (ZANTAC) 150 MG capsule Take 1 capsule (150 mg total) by mouth Two (2) times a day. (Patient not taking: Reported on 03/19/2018) 180 capsule 3   ??? sertraline (ZOLOFT) 50 MG tablet Take 50 mg by mouth daily.  1   ??? topiramate (TOPAMAX) 50 MG tablet Take 50 mg by mouth daily.  3   ??? venetoclax (VENCLEXTA) 100 mg tablet Take 2 tablets (200 mg total) by mouth daily. 60 each 6     No current facility-administered medications for this visit.      Facility-Administered Medications Ordered in Other Visits   Medication Dose Route Frequency Provider Last Rate Last Dose   ??? heparin, porcine (PF) 100 unit/mL injection 500 Units  500 Units Intravenous Q30 Min PRN Pernell Dupre, MD   500 Units at 04/02/18 7829     Physical exam:  There were no vitals filed for this visit.  Constitutional: Resting, in no apparent distress  Eyes:  No scleral icterus or conjunctival injection.  Ear/nose/mouth/throat: Oral mucosa without ulceration, erythema or exudate.   Hematology/lymphatic/immunologic:  No lymphadenopathy in the anterior/posterior cervical, supraclavicular basins.  Cardiovascular:  RRR.  S1, S2.  No murmurs, gallops or rubs. Appear well-perfused.  No clubbing or cyanosis. 1+ edema to bilateral ankles and feet.  Respiratory:  Breathing is unlabored, and patient is speaking full sentences with ease.  No stridor.  CTAB. No rales, ronchi or crackles.     GI:  No distention. Bowel sounds are present and normal in quality.  Mild pain on palpation of right upper quadrant. No palpable hepatomegaly or splenomegaly.  No palpable masses.   Musculoskeletal: . No pain on palpation of the spinous processes of the cervical, thoracic or lumbar vertebral bodies. No grossly-evident joint effusions or deformities.  Range of motion about the shoulder, elbow, hips and knees is grossly normal.    Skin:  No rashes or areas of skin breakdown.  Warm to touch, dry, smooth and even.  Neurologic:  Gait is normal.  Cerebellar tasks are completed with ease and are symmetric.  Psychiatric:  Alert and oriented to person, place, time and situation.  Range of affect is appropriate.      ECOG Performance Status: 1    Results:  Labs and pathology have been reviewed and pertinent results are as follows:    Results for orders placed or performed in visit on 04/02/18   CBC w/ Differential   Result Value Ref Range    WBC 2.4 (L) 4.5 - 11.0 10*9/L    RBC 3.93 (L) 4.00 - 5.20 10*12/L    HGB 11.9 (L) 12.0 - 16.0 g/dL    HCT 21.3 (L) 08.6 - 46.0 %    MCV 89.8 80.0 - 100.0 fL    MCH 30.3 26.0 - 34.0 pg    MCHC 33.7 31.0 - 37.0 g/dL    RDW 57.8 (H) 46.9 - 15.0 %    MPV 10.5 (H) 7.0 - 10.0 fL    Platelet 31 (L) 150 - 440 10*9/L    Variable HGB Concentration Slight (A) Not Present    Neutrophils % 63.1 %    Lymphocytes % 29.3 %    Monocytes % 3.9 %    Eosinophils % 0.5 %    Basophils % 0.2 %    Neutrophil Left Shift 3+ (A) Not Present    Absolute Neutrophils 1.5 (L) 2.0 - 7.5 10*9/L    Absolute Lymphocytes 0.7 (L) 1.5 - 5.0 10*9/L    Absolute Monocytes 0.1 (L) 0.2 - 0.8 10*9/L    Absolute Eosinophils 0.0 0.0 - 0.4 10*9/L    Absolute Basophils 0.0 0.0 - 0.1 10*9/L    Large Unstained Cells 3 0 - 4 %    Macrocytosis Slight (A) Not Present    Anisocytosis Moderate (A) Not Present    Hypochromasia Slight (A) Not Present

## 2018-04-02 NOTE — Unmapped (Signed)
Labs drawn and sent for analysis.  Care provided by  A Wisniewski, RN

## 2018-04-09 ENCOUNTER — Institutional Professional Consult (permissible substitution): Admit: 2018-04-09 | Discharge: 2018-04-09 | Payer: MEDICARE

## 2018-04-09 DIAGNOSIS — D701 Agranulocytosis secondary to cancer chemotherapy: Secondary | ICD-10-CM

## 2018-04-09 DIAGNOSIS — C9110 Chronic lymphocytic leukemia of B-cell type not having achieved remission: Principal | ICD-10-CM

## 2018-04-09 DIAGNOSIS — T451X5A Adverse effect of antineoplastic and immunosuppressive drugs, initial encounter: Secondary | ICD-10-CM

## 2018-04-09 LAB — CBC W/ AUTO DIFF
BASOPHILS ABSOLUTE COUNT: 0 10*9/L (ref 0.0–0.1)
BASOPHILS RELATIVE PERCENT: 0.7 %
EOSINOPHILS ABSOLUTE COUNT: 0 10*9/L (ref 0.0–0.4)
EOSINOPHILS RELATIVE PERCENT: 0.6 %
HEMATOCRIT: 35.8 % — ABNORMAL LOW (ref 36.0–46.0)
HEMOGLOBIN: 12 g/dL — ABNORMAL LOW (ref 13.5–16.0)
LARGE UNSTAINED CELLS: 6 % — ABNORMAL HIGH (ref 0–4)
LYMPHOCYTES ABSOLUTE COUNT: 0.9 10*9/L — ABNORMAL LOW (ref 1.5–5.0)
LYMPHOCYTES RELATIVE PERCENT: 63.7 %
MEAN CORPUSCULAR HEMOGLOBIN: 29.9 pg (ref 26.0–34.0)
MEAN PLATELET VOLUME: 9.8 fL (ref 7.0–10.0)
MONOCYTES ABSOLUTE COUNT: 0.2 10*9/L (ref 0.2–0.8)
MONOCYTES RELATIVE PERCENT: 14.8 %
NEUTROPHILS ABSOLUTE COUNT: 0.2 10*9/L — CL (ref 2.0–7.5)
NEUTROPHILS RELATIVE PERCENT: 14.3 %
PLATELET COUNT: 42 10*9/L — ABNORMAL LOW (ref 150–440)
RED BLOOD CELL COUNT: 4.03 10*12/L (ref 4.00–5.20)
WBC ADJUSTED: 1.4 10*9/L — ABNORMAL LOW (ref 4.5–11.0)

## 2018-04-09 LAB — SMEAR REVIEW

## 2018-04-09 LAB — MEAN CORPUSCULAR HEMOGLOBIN: Lab: 29.9

## 2018-04-09 NOTE — Unmapped (Signed)
Labs drawn via [place].    Port flushed and brisk blood return     Pt tolerated well.    Results reviewed - granix given.    Interventions - intervention needed.    Pt stable and ambulated independently from clinic.    Pt states she has had  a cold for the past week. Has been afebrile     VS taken.    Labs completed via [place].     Pt received Granix injection in left lower abdomen - pt tolerated well.    AVS received at scheduling.    Pt stable and ambulated independently from clinic.

## 2018-04-10 NOTE — Unmapped (Signed)
Hi,     Patient contacted the Communication Center requesting to speak with the care team of Katrina Gould to discuss:    Requesting to speak with Synetta Fail, wanting to know where should she get her labs drawn on Friday?    Please contact patient at [phone].    Program: Heme Malignancy  Speciality: Medical Oncology    Check Indicates criteria has been reviewed and confirmed with the patient:    [x]  Preferred Name   [x]  DOB and/or MR#  [x]  Preferred Contact Method  [x]  Phone Number(s)   []  MyChart     Thank you,   Jannette Spanner  [facility]   [phone]

## 2018-04-10 NOTE — Unmapped (Incomplete)
[facility] Triage Note     Patient: Katrina Gould     Reason for call:  return call    Time call returned: 1034     Phone Assessment: Spoke with patient, she was unsure her lab and injection schedule.    Also when she was sick with a cold on Thursday, she started an Antibiotic and was confused that she may have taken her Venetoclax twice that day.     Triage Recommendations: Reviewed appointment schedule with patient at [facility] for the month of December.    Will call her back if there is anything that Lurena Joiner would like to tell her.     Patient Response: grateful     Outstanding tasks: Follow up with patient if needed.     Patient Pharmacy has been verified and primary pharmacy has been marked as preferred

## 2018-04-10 NOTE — Unmapped (Signed)
[facility] Triage Note     Patient: Katrina Gould     Reason for call:  return call    Time call returned: 1145     Phone Assessment: Spoke with patient     Triage Recommendations: She stated that she has been holding her Venetoclax and will have her labs and granix on Friday.     Patient Response: grateful     Outstanding tasks: Care team notification no further actions needed      Patient Pharmacy has been verified and primary pharmacy has been marked as preferred

## 2018-04-12 ENCOUNTER — Institutional Professional Consult (permissible substitution): Admit: 2018-04-12 | Discharge: 2018-04-13 | Payer: MEDICARE

## 2018-04-12 DIAGNOSIS — C9110 Chronic lymphocytic leukemia of B-cell type not having achieved remission: Secondary | ICD-10-CM

## 2018-04-12 DIAGNOSIS — D701 Agranulocytosis secondary to cancer chemotherapy: Secondary | ICD-10-CM

## 2018-04-12 DIAGNOSIS — T451X5A Adverse effect of antineoplastic and immunosuppressive drugs, initial encounter: Secondary | ICD-10-CM

## 2018-04-12 LAB — CBC W/ AUTO DIFF
BASOPHILS ABSOLUTE COUNT: 0 10*9/L (ref 0.0–0.1)
BASOPHILS RELATIVE PERCENT: 0.9 %
EOSINOPHILS ABSOLUTE COUNT: 0 10*9/L (ref 0.0–0.4)
EOSINOPHILS RELATIVE PERCENT: 0.5 %
HEMATOCRIT: 36.1 % (ref 36.0–46.0)
HEMOGLOBIN: 12.3 g/dL — ABNORMAL LOW (ref 13.5–16.0)
LARGE UNSTAINED CELLS: 7 % — ABNORMAL HIGH (ref 0–4)
LYMPHOCYTES ABSOLUTE COUNT: 1 10*9/L — ABNORMAL LOW (ref 1.5–5.0)
LYMPHOCYTES RELATIVE PERCENT: 43.2 %
MEAN CORPUSCULAR HEMOGLOBIN CONC: 34 g/dL (ref 31.0–37.0)
MEAN CORPUSCULAR VOLUME: 89.4 fL (ref 80.0–100.0)
MONOCYTES ABSOLUTE COUNT: 0.4 10*9/L (ref 0.2–0.8)
MONOCYTES RELATIVE PERCENT: 14.8 %
NEUTROPHILS ABSOLUTE COUNT: 0.8 10*9/L — ABNORMAL LOW (ref 2.0–7.5)
PLATELET COUNT: 30 10*9/L — ABNORMAL LOW (ref 150–440)
RED BLOOD CELL COUNT: 4.03 10*12/L (ref 4.00–5.20)
RED CELL DISTRIBUTION WIDTH: 17.6 % — ABNORMAL HIGH (ref 12.0–15.0)
WBC ADJUSTED: 2.3 10*9/L — ABNORMAL LOW (ref 4.5–11.0)

## 2018-04-12 LAB — MEAN CORPUSCULAR HEMOGLOBIN: Lab: 30.4

## 2018-04-12 NOTE — Unmapped (Signed)
Labs drawn via [place].    Port flushed and brisk blood return     Pt tolerated well.

## 2018-04-13 ENCOUNTER — Ambulatory Visit: Admit: 2018-04-13 | Discharge: 2018-04-14 | Payer: MEDICARE

## 2018-04-13 DIAGNOSIS — D701 Agranulocytosis secondary to cancer chemotherapy: Principal | ICD-10-CM

## 2018-04-13 DIAGNOSIS — T451X5A Adverse effect of antineoplastic and immunosuppressive drugs, initial encounter: Secondary | ICD-10-CM

## 2018-04-13 NOTE — Unmapped (Signed)
Pt arrived ambulatory in NAD to chair 16 1550 Pt tolerated injection well  AVS printed and given to patient

## 2018-04-16 ENCOUNTER — Ambulatory Visit: Admit: 2018-04-16 | Discharge: 2018-04-17 | Payer: MEDICARE | Attending: Adult Health | Primary: Adult Health

## 2018-04-16 ENCOUNTER — Other Ambulatory Visit: Admit: 2018-04-16 | Discharge: 2018-04-17 | Payer: MEDICARE

## 2018-04-16 ENCOUNTER — Ambulatory Visit: Admit: 2018-04-16 | Discharge: 2018-04-17 | Payer: MEDICARE

## 2018-04-16 DIAGNOSIS — C9110 Chronic lymphocytic leukemia of B-cell type not having achieved remission: Secondary | ICD-10-CM

## 2018-04-16 DIAGNOSIS — T451X5A Adverse effect of antineoplastic and immunosuppressive drugs, initial encounter: Secondary | ICD-10-CM

## 2018-04-16 DIAGNOSIS — D701 Agranulocytosis secondary to cancer chemotherapy: Secondary | ICD-10-CM

## 2018-04-16 DIAGNOSIS — D693 Immune thrombocytopenic purpura: Secondary | ICD-10-CM

## 2018-04-16 LAB — PHOSPHORUS: Phosphate:MCnc:Pt:Ser/Plas:Qn:: 4

## 2018-04-16 LAB — COMPREHENSIVE METABOLIC PANEL
ALBUMIN: 3.9 g/dL (ref 3.5–5.0)
ALKALINE PHOSPHATASE: 89 U/L (ref 38–126)
ALT (SGPT): 14 U/L (ref <35)
ANION GAP: 9 mmol/L (ref 7–15)
AST (SGOT): 23 U/L (ref 14–38)
BUN / CREAT RATIO: 18
CALCIUM: 9.1 mg/dL (ref 8.5–10.2)
CHLORIDE: 108 mmol/L — ABNORMAL HIGH (ref 98–107)
CO2: 23 mmol/L (ref 22.0–30.0)
CREATININE: 0.84 mg/dL (ref 0.60–1.00)
EGFR CKD-EPI AA FEMALE: 76 mL/min/{1.73_m2} (ref >=60)
EGFR CKD-EPI NON-AA FEMALE: 66 mL/min/{1.73_m2} (ref >=60)
GLUCOSE RANDOM: 107 mg/dL (ref 65–179)
POTASSIUM: 3.7 mmol/L (ref 3.5–5.0)
PROTEIN TOTAL: 6.4 g/dL — ABNORMAL LOW (ref 6.5–8.3)
SODIUM: 140 mmol/L (ref 135–145)

## 2018-04-16 LAB — CBC W/ AUTO DIFF
BASOPHILS ABSOLUTE COUNT: 0 10*9/L (ref 0.0–0.1)
BASOPHILS RELATIVE PERCENT: 0.5 %
EOSINOPHILS ABSOLUTE COUNT: 0 10*9/L (ref 0.0–0.4)
HEMATOCRIT: 36.7 % (ref 36.0–46.0)
HEMOGLOBIN: 12.3 g/dL (ref 12.0–16.0)
LARGE UNSTAINED CELLS: 5 % — ABNORMAL HIGH (ref 0–4)
LYMPHOCYTES ABSOLUTE COUNT: 0.9 10*9/L — ABNORMAL LOW (ref 1.5–5.0)
LYMPHOCYTES RELATIVE PERCENT: 31.5 %
MEAN CORPUSCULAR HEMOGLOBIN CONC: 33.4 g/dL (ref 31.0–37.0)
MEAN CORPUSCULAR HEMOGLOBIN: 30 pg (ref 26.0–34.0)
MEAN CORPUSCULAR VOLUME: 89.6 fL (ref 80.0–100.0)
MEAN PLATELET VOLUME: 11.6 fL — ABNORMAL HIGH (ref 7.0–10.0)
MONOCYTES ABSOLUTE COUNT: 0.2 10*9/L (ref 0.2–0.8)
MONOCYTES RELATIVE PERCENT: 8.2 %
NEUTROPHILS ABSOLUTE COUNT: 1.5 10*9/L — ABNORMAL LOW (ref 2.0–7.5)
NEUTROPHILS RELATIVE PERCENT: 54.1 %
PLATELET COUNT: 13 10*9/L — ABNORMAL LOW (ref 150–440)
RED CELL DISTRIBUTION WIDTH: 18 % — ABNORMAL HIGH (ref 12.0–15.0)
WBC ADJUSTED: 2.7 10*9/L — ABNORMAL LOW (ref 4.5–11.0)

## 2018-04-16 LAB — BASOPHILS RELATIVE PERCENT: Lab: 0.5

## 2018-04-16 LAB — PLATELET COUNT: Lab: 31 — ABNORMAL LOW

## 2018-04-16 LAB — URIC ACID: Urate:MCnc:Pt:Ser/Plas:Qn:: 4.4

## 2018-04-16 LAB — CREATININE: Creatinine:MCnc:Pt:Ser/Plas:Qn:: 0.84

## 2018-04-16 MED ORDER — ELTROMBOPAG 25 MG TABLET
ORAL_TABLET | Freq: Every day | ORAL | 2 refills | 0 days | Status: CP
Start: 2018-04-16 — End: 2018-06-04

## 2018-04-16 NOTE — Unmapped (Signed)
Hospital Outpatient Visit on 04/16/2018   Component Date Value Ref Range Status   ??? Platelet 04/16/2018 31* 150 - 440 10*9/L Final   ??? Unit Blood Type 04/16/2018 O Pos   Final   ??? ISBT Number 04/16/2018 [id number]   Final   ??? Unit # 04/16/2018 [id number]   Final   ??? Status 04/16/2018 Issued   Final   ??? Product ID 04/16/2018 Platelets   Final   ??? PRODUCT CODE 04/16/2018 [id number]   Final   Lab on 04/16/2018   Component Date Value Ref Range Status   ??? Sodium 04/16/2018 140  135 - 145 mmol/L Final   ??? Potassium 04/16/2018 3.7  3.5 - 5.0 mmol/L Final   ??? Chloride 04/16/2018 108* 98 - 107 mmol/L Final   ??? CO2 04/16/2018 23.0  22.0 - 30.0 mmol/L Final   ??? BUN 04/16/2018 15  7 - 21 mg/dL Final   ??? Creatinine 04/16/2018 0.84  0.60 - 1.00 mg/dL Final   ??? BUN/Creatinine Ratio 04/16/2018 18   Final   ??? EGFR CKD-EPI Non-African American,* 04/16/2018 66  >=60 mL/min/1.37m2 Final   ??? EGFR CKD-EPI African American, Fem* 04/16/2018 76  >=60 mL/min/1.27m2 Final   ??? Glucose 04/16/2018 107  65 - 179 mg/dL Final   ??? Calcium 44/05/270 9.1  8.5 - 10.2 mg/dL Final   ??? Albumin 53/66/4403 3.9  3.5 - 5.0 g/dL Final   ??? Total Protein 04/16/2018 6.4* 6.5 - 8.3 g/dL Final   ??? Total Bilirubin 04/16/2018 0.5  0.0 - 1.2 mg/dL Final   ??? AST 47/42/5956 23  14 - 38 U/L Final   ??? ALT 04/16/2018 14  <35 U/L Final   ??? Alkaline Phosphatase 04/16/2018 89  38 - 126 U/L Final   ??? Anion Gap 04/16/2018 9  7 - 15 mmol/L Final   ??? Uric Acid 04/16/2018 4.4  3.0 - 6.5 mg/dL Final   ??? Phosphorus 04/16/2018 4.0  2.9 - 4.7 mg/dL Final   ??? WBC 38/75/6433 2.7* 4.5 - 11.0 10*9/L Final   ??? RBC 04/16/2018 4.10  4.00 - 5.20 10*12/L Final   ??? HGB 04/16/2018 12.3  12.0 - 16.0 g/dL Final   ??? HCT 29/51/8841 36.7  36.0 - 46.0 % Final   ??? MCV 04/16/2018 89.6  80.0 - 100.0 fL Final   ??? MCH 04/16/2018 30.0  26.0 - 34.0 pg Final   ??? MCHC 04/16/2018 33.4  31.0 - 37.0 g/dL Final   ??? RDW 66/09/3014 18.0* 12.0 - 15.0 % Final   ??? MPV 04/16/2018 11.6* 7.0 - 10.0 fL Final   ??? Platelet 04/16/2018 13* 150 - 440 10*9/L Final    Questionable result confirmed. Specimen integrity/identity confirmed.    ??? Variable HGB Concentration 04/16/2018 Slight* Not Present Final   ??? Neutrophils % 04/16/2018 54.1  % Final   ??? Lymphocytes % 04/16/2018 31.5  % Final   ??? Monocytes % 04/16/2018 8.2  % Final   ??? Eosinophils % 04/16/2018 0.4  % Final   ??? Basophils % 04/16/2018 0.5  % Final   ??? Neutrophil Left Shift 04/16/2018 1+* Not Present Final   ??? Absolute Neutrophils 04/16/2018 1.5* 2.0 - 7.5 10*9/L Final   ??? Absolute Lymphocytes 04/16/2018 0.9* 1.5 - 5.0 10*9/L Final   ??? Absolute Monocytes 04/16/2018 0.2  0.2 - 0.8 10*9/L Final   ??? Absolute Eosinophils 04/16/2018 0.0  0.0 - 0.4 10*9/L Final   ??? Absolute Basophils 04/16/2018 0.0  0.0 - 0.1 10*9/L  Final   ??? Large Unstained Cells 04/16/2018 5* 0 - 4 % Final   ??? Macrocytosis 04/16/2018 Slight* Not Present Final   ??? Anisocytosis 04/16/2018 Moderate* Not Present Final   ??? Hypochromasia 04/16/2018 Slight* Not Present Final

## 2018-04-16 NOTE — Unmapped (Signed)
[facility] Follow Up       Patient Name: Katrina Gould  Patient Age: 77 y.o.  Encounter Date: 04/16/2018      Chief complaint/Reason for visit: CLL/SLL    Assessment:  Katrina Gould is a 77 y.o. female who presents for follow of her CLL/SLL, Rai stage 4. Disease markers: 46,XX,der(4)t(4;4)(p14;q21). IGHV unmutated (0%, 3-33*01). B70m 5.05. No TP53 mutation. High-risk by CLL-IPI (6 points).    Katrina Gould is doing fair today.  She feels about the same, with poor appetite and normal aches and pains.  She did have a cortisone shot to her shoulder, which helped mildly.      Continues to have cytopenias from the venetoclax.  Her ANC has come up some since getting.  Now her platelets have decreased to 13k.  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  Bone marrow without CLL.    Plans and Recommendations:  1. CLL, Rai 4   - Dose reduce Venetoclax to 100 mg when platelets recover  - Initiate eltrombopag for ITP 50mg  PO daily - rx sent to [facility]  - Transfuse platelets today for plts 13k  - continue Granix for ANC <1.0 (plan updated to reflect PRN)  - Weekly follow up for now, twice weekly labs  - RTC 12/23     2. Diarrhea  - solid stools recently with holding of venetoclax    3. Dermatitis  - resolved with triamcinolone cream     4. Pain related to falls  - continue tylenol, ice, or heat as needed for pain relief  - cortisone shot to shoulder recently - improved    5. Health Maintenance  - flu shot given this season  - continue annual dermatology follow up  - 1st Shingrix vaccine was given 06/2017; patient will need second vaccine, but recommend that she get this done at a pharmacy or at her PCP's office for insurance purposes    Due to this patient's diagnosis, she is at significant risk for subsequent morbidity and/or mortality.    Markus Jarvis, AGPCNP-C, MSN, OCN  Nurse Practitioner  Hematologic Malignancies  [facility]  [phone] (phone)  [phone] (fax) [email or web address]          Interval History:  Since being seen last, feels well for the most part.  Reports a headache today and her stomach has been bothering her.  She reports formed stools for the past 3 days.  Appetite is still fair. Holding her venetoclax because of recent ANC drop.  Got cortisone shot to right shoulder.    Otherwise, she denies new constitutional symptoms such as anorexia, weight loss, drenching night sweats or unexplained fevers.  Furthermore, she denies symptoms of marrow failure:  recurrent or unexplained intercurrent infections.  There have been no new or unexplained pains or self-identified masses, swelling or enlarged lymph nodes.    Past Medical, Surgical and Family History were reviewed and pertinent updates were made in the Electronic Medical Record  ??  Review of Systems:  Other than as reported above in the interim history, the other systems reviewed were unremarkable.  ??  ECOG Performance Status: 1     Oncology History:    Oncology History    CLL    First seen in oncology clinic locally 03/31/16    Had a left axillary biopsy 03/15/16 that confirmed CLL/SLL: Flow showed monoclonal B-cell population with CD5, CD23, kappa light chain. IHC showed positive CD20 (diffuse), negative for cyclin  D1.    No constitutional symptoms except some sweats    Labs: Hb 12.0, Hct 34.8, plt 147. WBC was 10.6 with ANC 2.5. ALC was 7.5K.    CLL FISH studies: normal panel. No cells with 11q, tri 12, 17p or 13q, and the 11;14 translocation also not detected.    CT c/a/p 04/04/16 showed mild LAD in chest, abd, and pelvis and spleen was at ULN (13 x 5.6 x 10.9 cm). Largest node 1.6 cm.    Of note is that she had a prior CT on 04/06/15 that showed similarly small enlarged node, largest being 1.9 cm.    CLL-IPI: As per Lancet Oncol Vol 15 October 2014  Includes (1) TP53 status (no abnormalities vs. Del 17p and/or TP53 mutation - 4 points) - no 17p (did not send TP53 mutation testing), (2) IGHV mutation status (mut vs unmut - 2 points) (pending) (3) serum B2-microglobulin (</=3.5 vs >3.5 - 2 points) (gets 2 points here) (4) clinical stage (Rai 0 vs Rai I-IV - 1 point) (gets 1 point here) and (5) age (</=65 vs >65 - 1 point)  (gets 1 point here)     Low risk (0-1 points)= 93.2% OS at 5 years  Int risk (2-3 points)= 79.3%  High risk (4-6 points)= 63.3%  - this patient has 4 points already, pending IGHV testing and prior to treatment would also need to send TP53 mutation testing  Very high risk (7-10 points)= 23.3%               CLL (chronic lymphocytic leukemia) (CMS-HCC)    05/16/2016 Initial Diagnosis     CLL (chronic lymphocytic leukemia) (RAF-HCC)        B-cell chronic lymphocytic leukemia (CMS-HCC)    09/05/2017 - 10/11/2017 Chemotherapy     Chemotherapy Treatment    Treatment Goal Control   Line of Treatment [No plan line of treatment]   Plan Name OP RITUXIMAB    Start Date 09/14/2017   End Date 10/05/2017   Provider Pernell Dupre, MD   Chemotherapy riTUXimab (RITUXAN) 712.5 mg in sodium chloride (NS) 0.9 % 500 mL IVPB, 375 mg/m2 = 712.5 mg, Intravenous, Once, 1 of 1 cycle  Administration: 712.5 mg (09/14/2017)  riTUXimab (RITUXAN) 712.5 mg in sodium chloride (NS) 0.9 % 250 mL rapid infusion, 375 mg/m2 = 712.5 mg, Intravenous, Once, 1 of 1 cycle  Administration: 712.5 mg (09/21/2017), 712.5 mg (09/28/2017), 712.5 mg (10/05/2017)         10/16/2017 Initial Diagnosis     Chronic lymphocytic leukemia, Rai stage IV (CMS-HCC)      10/23/2017 -  Chemotherapy     OP OBINUTUZUMAB AND VENETOCLAX  chlorambucil 0.5 mg/kg PO on days 1, 15, obinutuzumab 100 mg IV on day 1, obinutuzumab 900 mg IV on day 2, obinutuzumab 1,000 mg IV on days 8,15 on Cycle 1, then obinutuzumab 1,000 mg IV on day 1 for sequent cycles, every 28 days.         Allergies   Allergen Reactions   ??? Baclofen      made me go crazy         Current Outpatient Medications   Medication Sig Dispense Refill   ??? allopurinol (ZYLOPRIM) 300 MG tablet Take 1 tablet (300 mg total) by mouth daily. 30 tablet 2   ??? cholestyramine (QUESTRAN) 4 gram packet Take 1 packet by mouth Three (3) times a day with a meal. 90 each 12   ??? fesoterodine (TOVIAZ) 4 mg 24  hr tablet Take by mouth Take as directed.      ??? fluticasone propion-salmeterol (ADVAIR) 100-50 mcg/dose diskus Inhale.     ??? loperamide (IMODIUM A-D) 2 mg tablet Take 2 mg by mouth Three (3) times a day as needed for diarrhea.     ??? LORazepam (ATIVAN) 0.5 MG tablet Take 0.5 mg by mouth two (2) times a day as needed.      ??? mirtazapine (REMERON) 7.5 MG tablet Take 7.5 mg by mouth nightly.     ??? multivitamin (TAB-A-VITE/THERAGRAN) per tablet Take 1 tablet by mouth daily.      ??? pantoprazole (PROTONIX) 40 MG tablet Take by mouth daily at 0600.      ??? pravastatin (PRAVACHOL) 80 MG tablet TAKE 1 TABLET BY MOUTH EVERY NIGHT AT BEDTIME     ??? sertraline (ZOLOFT) 50 MG tablet Take 50 mg by mouth daily.  1   ??? topiramate (TOPAMAX) 50 MG tablet Take 50 mg by mouth daily.  3   ??? eltrombopag (PROMACTA) 25 MG tablet Take 2 tablets (50 mg total) by mouth daily. Administer on an empty stomach, 1 hour before or 2 hours after a meal. 60 tablet 2   ??? oxyCODONE (ROXICODONE) 5 MG immediate release tablet Take 1 tablet (5 mg total) by mouth every eight (8) hours as needed for pain. (Patient not taking: Reported on 04/16/2018) 20 tablet 0   ??? predniSONE (DELTASONE) 10 MG tablet 6 TABS X 1 DAY, 5 TABS X 1 DAY, 4 TABS X 1 DAY, ETC...  0   ??? ranitidine (ZANTAC) 150 MG capsule Take 1 capsule (150 mg total) by mouth Two (2) times a day. (Patient not taking: Reported on 03/19/2018) 180 capsule 3   ??? triamcinolone (KENALOG) 0.1 % cream Apply topically Two (2) times a day. (Patient not taking: Reported on 04/16/2018) 30 g 1   ??? venetoclax (VENCLEXTA) 100 mg tablet Take 2 tablets (200 mg total) by mouth daily. (Patient not taking: Reported on 04/16/2018) 60 each 6     No current facility-administered medications for this visit.      Physical exam:  Vitals:    04/16/18 0955   BP: 125/72   Pulse: 83   Resp: 18   Temp: 37.1 ??C (98.7 ??F)   SpO2: 99%     Constitutional: Resting, in no apparent distress  Eyes:  No scleral icterus or conjunctival injection.  Ear/nose/mouth/throat: Oral mucosa without ulceration, erythema or exudate.   Hematology/lymphatic/immunologic:  No lymphadenopathy in the anterior/posterior cervical, supraclavicular basins.  Cardiovascular:  RRR.  S1, S2.  No murmurs, gallops or rubs. Appear well-perfused.  No clubbing or cyanosis. 1+ edema to bilateral ankles and feet.  Respiratory:  Breathing is unlabored, and patient is speaking full sentences with ease.  No stridor.  CTAB. No rales, ronchi or crackles.     GI:  No distention. Bowel sounds are present and normal in quality.  Mild pain on palpation of right upper quadrant. No palpable hepatomegaly or splenomegaly.  No palpable masses.   Musculoskeletal: . No pain on palpation of the spinous processes of the cervical, thoracic or lumbar vertebral bodies. No grossly-evident joint effusions or deformities.  Range of motion about the shoulder, elbow, hips and knees is grossly normal.    Skin:  No rashes or areas of skin breakdown.  Warm to touch, dry, smooth and even.  Neurologic:  Gait is normal.  Cerebellar tasks are completed with ease and are symmetric.  Psychiatric:  Alert and  oriented to person, place, time and situation.  Range of affect is appropriate.      ECOG Performance Status: 1    Results:  Labs and pathology have been reviewed and pertinent results are as follows:    Results for orders placed or performed in visit on 04/16/18   Comprehensive Metabolic Panel   Result Value Ref Range    Sodium 140 135 - 145 mmol/L    Potassium 3.7 3.5 - 5.0 mmol/L    Chloride 108 (H) 98 - 107 mmol/L    CO2 23.0 22.0 - 30.0 mmol/L    BUN 15 7 - 21 mg/dL    Creatinine 1.91 4.78 - 1.00 mg/dL    BUN/Creatinine Ratio 18     EGFR CKD-EPI Non-African American, Female 66 >=60 mL/min/1.13m2    EGFR CKD-EPI African American, Female 65 >=60 mL/min/1.46m2    Glucose 107 65 - 179 mg/dL    Calcium 9.1 8.5 - 29.5 mg/dL    Albumin 3.9 3.5 - 5.0 g/dL    Total Protein 6.4 (L) 6.5 - 8.3 g/dL    Total Bilirubin 0.5 0.0 - 1.2 mg/dL    AST 23 14 - 38 U/L    ALT 14 <35 U/L    Alkaline Phosphatase 89 38 - 126 U/L    Anion Gap 9 7 - 15 mmol/L   Uric acid   Result Value Ref Range    Uric Acid 4.4 3.0 - 6.5 mg/dL   Phosphorus Level   Result Value Ref Range    Phosphorus 4.0 2.9 - 4.7 mg/dL   CBC w/ Differential   Result Value Ref Range    WBC 2.7 (L) 4.5 - 11.0 10*9/L    RBC 4.10 4.00 - 5.20 10*12/L    HGB 12.3 12.0 - 16.0 g/dL    HCT 62.1 30.8 - 65.7 %    MCV 89.6 80.0 - 100.0 fL    MCH 30.0 26.0 - 34.0 pg    MCHC 33.4 31.0 - 37.0 g/dL    RDW 84.6 (H) 96.2 - 15.0 %    MPV 11.6 (H) 7.0 - 10.0 fL    Platelet 13 (L) 150 - 440 10*9/L    Variable HGB Concentration Slight (A) Not Present    Neutrophils % 54.1 %    Lymphocytes % 31.5 %    Monocytes % 8.2 %    Eosinophils % 0.4 %    Basophils % 0.5 %    Neutrophil Left Shift 1+ (A) Not Present    Absolute Neutrophils 1.5 (L) 2.0 - 7.5 10*9/L    Absolute Lymphocytes 0.9 (L) 1.5 - 5.0 10*9/L    Absolute Monocytes 0.2 0.2 - 0.8 10*9/L    Absolute Eosinophils 0.0 0.0 - 0.4 10*9/L    Absolute Basophils 0.0 0.0 - 0.1 10*9/L    Large Unstained Cells 5 (H) 0 - 4 %    Macrocytosis Slight (A) Not Present    Anisocytosis Moderate (A) Not Present    Hypochromasia Slight (A) Not Present

## 2018-04-16 NOTE — Unmapped (Signed)
Discharged from infusion area ambulatory with no complications noted post platelets. AVS given to pt.

## 2018-04-16 NOTE — Unmapped (Addendum)
Please return in 1 week for a follow up visit and labs.    If you have any questions, please do not hesitate to contact us.    Please contact us for any of these symptoms:    **Fevers over 100.4 are an EMERGENCY. Please go to the nearest ER**    1. New bone pain  2. Unintentional weight loss  3. Unexplained fatigue  4. Swelling of your legs  5. Yellowing of the skin or eyes    When reviewing your results, please remember that the results of many of the tests we order can vary somewhat and that variation often means nothing.  Sometimes when we get results back after your clinic visit, if it looks like there???s some variation of that type, we may decide to recheck things sooner than we discussed in clinic.  If you get a call that we want to recheck things sooner, do not panic. It does not mean that things are going wrong.    For appointments & questions Monday through Friday 8 AM??? 5 PM   please call [phone] or Toll free [phone].    On Nights, Weekends and Holidays  Call [phone] and ask for the adult hematologist/oncologist on call.    Markus Jarvis, AGPCNP-C, MSN, OCN  Nurse Practitioner  Hematologic Malignancies  [facility]    Nurse Navigator: Frankey Poot, RN  Questions and appointments M-F 8am - 5pm: [phone] or [phone]    [facility]  [address]  [place], [place] [address]  [email or web address]    Results for orders placed or performed in visit on 04/16/18   Comprehensive Metabolic Panel   Result Value Ref Range    Sodium 140 135 - 145 mmol/L    Potassium 3.7 3.5 - 5.0 mmol/L    Chloride 108 (H) 98 - 107 mmol/L    CO2 23.0 22.0 - 30.0 mmol/L    BUN 15 7 - 21 mg/dL    Creatinine 3.66 4.40 - 1.00 mg/dL    BUN/Creatinine Ratio 18     EGFR CKD-EPI Non-African American, Female 66 >=60 mL/min/1.73m2    EGFR CKD-EPI African American, Female 33 >=60 mL/min/1.9m2    Glucose 107 65 - 179 mg/dL    Calcium 9.1 8.5 - 34.7 mg/dL    Albumin 3.9 3.5 - 5.0 g/dL    Total Protein 6.4 (L) 6.5 - 8.3 g/dL    Total Bilirubin 0.5 0.0 - 1.2 mg/dL    AST 23 14 - 38 U/L    ALT 14 <35 U/L    Alkaline Phosphatase 89 38 - 126 U/L    Anion Gap 9 7 - 15 mmol/L   Uric acid   Result Value Ref Range    Uric Acid 4.4 3.0 - 6.5 mg/dL   Phosphorus Level   Result Value Ref Range    Phosphorus 4.0 2.9 - 4.7 mg/dL   CBC w/ Differential   Result Value Ref Range    WBC 2.7 (L) 4.5 - 11.0 10*9/L    RBC 4.10 4.00 - 5.20 10*12/L    HGB 12.3 12.0 - 16.0 g/dL    HCT 42.5 95.6 - 38.7 %    MCV 89.6 80.0 - 100.0 fL    MCH 30.0 26.0 - 34.0 pg    MCHC 33.4 31.0 - 37.0 g/dL    RDW 56.4 (H) 33.2 - 15.0 %    MPV 11.6 (H) 7.0 - 10.0 fL    Platelet 13 (L) 150 - 440  10*9/L    Variable HGB Concentration Slight (A) Not Present    Neutrophils % 54.1 %    Lymphocytes % 31.5 %    Monocytes % 8.2 %    Eosinophils % 0.4 %    Basophils % 0.5 %    Neutrophil Left Shift 1+ (A) Not Present    Absolute Neutrophils 1.5 (L) 2.0 - 7.5 10*9/L    Absolute Lymphocytes 0.9 (L) 1.5 - 5.0 10*9/L    Absolute Monocytes 0.2 0.2 - 0.8 10*9/L    Absolute Eosinophils 0.0 0.0 - 0.4 10*9/L    Absolute Basophils 0.0 0.0 - 0.1 10*9/L    Large Unstained Cells 5 (H) 0 - 4 %    Macrocytosis Slight (A) Not Present    Anisocytosis Moderate (A) Not Present    Hypochromasia Slight (A) Not Present       Patient Education        eltrombopag  Pronunciation:  el TROM boe pag  Brand:  Promacta  What is the most important information I should know about eltrombopag?  Call your doctor at once if you have signs of liver problems: nausea, upper stomach pain, confusion, tired feeling, loss of appetite, dark urine, clay-colored stools, jaundice (yellowing of the skin or eyes).  What is eltrombopag?  Eltrombopag is used to prevent bleeding episodes in adults and children age 39 year and older, who have chronic immune thrombocytopenic purpura (ITP). ITP is a bleeding condition caused by a lack of platelets in the blood.  Eltrombopag is not a cure for ITP and it will not make your platelet counts normal if you have this condition.  Eltrombopag is also used to prevent bleeding in adults with chronic hepatitis C who are treated with an interferon (such as Intron A, Infergen, Pegasys, PegIntron, Rebetron, Redipen, or Sylatron).  Eltrombopag is also used together with other medications to treat severe aplastic anemia in adults and children who are at least 21 years old.  Eltrombopag is sometimes given after other treatments have failed.  Eltrombopag may also be used for purposes not listed in this medication guide.  What should I discuss with my health care provider before taking eltrombopag?  You should not use eltrombopag if you are allergic to it.  Tell your doctor if you have ever had:  ?? a blood clot;  ?? liver disease (unless you are being treated for hepatitis C);  ?? kidney disease;  ?? myelodysplastic syndrome (also called preleukemia);  ?? cataracts;  ?? surgery to remove your spleen; or  ?? if you are of [place] descent [place], or [place]).  If you have myelodysplastic syndrome, taking eltrombopag can increase your risk of developing leukemia (a blood cancer that can be fatal).  Talk with your doctor if you have concerns about this risk.  It is not known whether this medicine will harm an unborn baby. Tell your doctor if you are pregnant or plan to become pregnant.  If you are pregnant, your name may be listed on a pregnancy registry to track the effects of eltrombopag on the baby.  You should not breast-feed while using this medicine.  How should I take eltrombopag?  Follow all directions on your prescription label and read all medication guides or instruction sheets. Your doctor may occasionally change your dose. Use the medicine exactly as directed.  Read and carefully follow any Instructions for Use provided with your medicine. Ask your doctor or pharmacist if you do not understand these instructions.  Take eltrombopag on an empty stomach, at least 1 hour before or 2 hours after a meal.  Do not take eltrombopag with dairy products or calcium-fortified juice.   Mix the oral suspension  powder only with water. Use a new dosing syringe each time you mix the medicine, to measure the water and to give the correct dose.  Swallow the tablet  whole and do not crush, chew, or break it.  You will need frequent blood tests to check your liver function. Your eyes may also need to be checked for signs of cataract formation.  Eltrombopag doses are sometimes based on weight in children. Your child's dose needs may change if the child gains or loses weight.  If you take eltrombopag with medication to treat chronic hepatitis C, tell your doctor if you stop using any of your hepatitis medications.  Taking eltrombopag long-term can cause harmful effects on your bone marrow that may result in serious blood cell disorders.  It may take up to 4 weeks of taking eltrombopag before it is completely effective in preventing bleeding episodes. Keep taking the medicine as directed and tell your doctor if you have any bruising or bleeding episodes after 4 weeks of treatment.  Store at room temperature away from moisture and heat. Keep the tablets  in their original container, along with the packet or canister of moisture-absorbing preservative. After mixing eltrombopag oral suspension, store the liquid at room temperature and use it within 30 minutes.  After you stop taking eltrombopag, your risk of bleeding or bruising may be even higher than it was before you started treatment. Be extra careful to avoid cuts or injury for at least 4 weeks after you stop taking eltrombopag. Your blood will need to be tested weekly during this time.  What happens if I miss a dose?  Take the medicine as soon as you can, but skip the missed dose if it is almost time for your next dose. Do not take two doses at one time.  What happens if I overdose?  Seek emergency medical attention or call the Poison Help line at [phone]. What should I avoid while taking eltrombopag?  Avoid all dairy products or products that contain calcium (including fortified fruit juice) for at least 4 hours before or 2 hours after you take eltrombopag.  Avoid activities that may increase your risk of bleeding or injury. Use extra care to prevent bleeding while shaving or brushing your teeth.  What are the possible side effects of eltrombopag?  Get emergency medical help if you have signs of an allergic reaction:  hives; difficult breathing; swelling of your face, lips, tongue, or throat.  You could develop a blood clot if your platelet count gets too high while you are using eltrombopag. Call your doctor or get emergency medical help if you have:  ?? signs of a stroke --sudden numbness or weakness (especially on one side of the body), sudden severe headache, slurred speech, problems with vision or balance;  ?? signs of a blood clot in the stomach --severe stomach pain, vomiting, diarrhea;  ?? signs of a blood clot in the lung --chest pain, sudden cough, wheezing, rapid breathing, coughing up blood; or  ?? signs of a blood clot in your leg --pain, swelling, warmth, or redness in one or both legs.  Call your doctor at once if you have:  ?? vision changes, tunnel vision, eye pain, or seeing halos around lights;  ?? flu symptoms such as fever, chills, cough, sore  throat, headache, body aches, tired feeling;  ?? increased or frequent need to urinate, pain or burning when you urinate;  ?? low red blood cells (anemia) --pale skin, unusual tiredness, feeling light-headed or short of breath, cold hands and feet; or  ?? liver problems --confusion, nausea, upper stomach pain, tiredness, loss of appetite, dark urine, clay-colored stools, jaundice (yellowing of the skin or eyes).  Common side effects may include:  ?? nausea, vomiting, diarrhea, stomach pain, loss of appetite;  ?? cold symptoms such as stuffy nose, sneezing, cough, sore throat;  ?? fever, flu symptoms, anemia;  ?? mouth pain, tooth pain;  ?? muscle pain, back pain, headache;  ?? swelling in your feet or ankles;  ?? feeling weak or tired;  ?? sleep problems (insomnia);  ?? itching, tingling, or burning under your skin;  ?? skin rash;  ?? hair loss; or  ?? abnormal liver function tests.  This is not a complete list of side effects and others may occur. Call your doctor for medical advice about side effects. You may report side effects to FDA at 1-800-FDA-1088.  What other drugs will affect eltrombopag?  Some medicines can make eltrombopag much less effective when taken at the same time. If you take any of the following medicines, take your eltrombopag dose 2 hours before or 4 hours after you take the other medicine.  ?? an antacid; or  ?? vitamin or mineral supplements that contain aluminum, calcium, iron, magnesium, selenium, or zinc.  Tell your doctor about all your other medicines, especially:  ?? a blood thinner --warfarin, Coumadin, Jantoven; or  ?? cholesterol medication --atorvastatin, ezetimibe, fluvastatin, pitavastatin, pravastatin, rosuvastatin, or simvastatin.  This list is not complete. Other drugs may affect eltrombopag, including prescription and over-the-counter medicines, vitamins, and herbal products. Not all possible drug interactions are listed here.  Where can I get more information?  Your doctor or pharmacist can provide more information about eltrombopag.  Remember, keep this and all other medicines out of the reach of children, never share your medicines with others, and use this medication only for the indication prescribed.  Every effort has been made to ensure that the information provided by [employer]') is accurate, up-to-date, and complete, but no guarantee is made to that effect. Drug information contained herein may be time sensitive. [employer] information has been compiled for use by healthcare practitioners and consumers in the [place] and therefore [employer] does not warrant that uses outside of the [place] are appropriate, unless specifically indicated otherwise. [employer]'s drug information does not endorse drugs, diagnose patients or recommend therapy. [employer]'s drug information is an [employer], corporate to assist licensed healthcare practitioners in caring for their patients and/or to serve consumers viewing this service as a supplement to, and not a substitute for, the expertise, skill, knowledge and judgment of healthcare practitioners. The absence of a warning for a given drug or drug combination in no way should be construed to indicate that the drug or drug combination is safe, effective or appropriate for any given patient. [employer] does not assume any responsibility for any aspect of healthcare administered with the aid of information [employer] provides. The information contained herein is not intended to cover all possible uses, directions, precautions, warnings, drug interactions, allergic reactions, or adverse effects. If you have questions about the drugs you are taking, check with your doctor, nurse or pharmacist.  Copyright [phone] [employer] Version: 12.02. Revision date: 08/16/2017.  Care instructions adapted under license by [facility]. If you have questions about a medical condition or this instruction, always ask your healthcare professional. [employer] disclaims any warranty or liability for your use of this information.

## 2018-04-16 NOTE — Unmapped (Signed)
Labs drawn and sent.  Care provided by Harvel Quale, RN.

## 2018-04-19 ENCOUNTER — Ambulatory Visit: Admit: 2018-04-19 | Discharge: 2018-04-19 | Payer: MEDICARE

## 2018-04-19 DIAGNOSIS — T451X5A Adverse effect of antineoplastic and immunosuppressive drugs, initial encounter: Secondary | ICD-10-CM

## 2018-04-19 DIAGNOSIS — C9110 Chronic lymphocytic leukemia of B-cell type not having achieved remission: Secondary | ICD-10-CM

## 2018-04-19 DIAGNOSIS — D701 Agranulocytosis secondary to cancer chemotherapy: Secondary | ICD-10-CM

## 2018-04-19 LAB — CBC W/ AUTO DIFF
BASOPHILS ABSOLUTE COUNT: 0 10*9/L (ref 0.0–0.1)
EOSINOPHILS ABSOLUTE COUNT: 0 10*9/L (ref 0.0–0.4)
EOSINOPHILS RELATIVE PERCENT: 0.7 %
HEMATOCRIT: 33.7 % — ABNORMAL LOW (ref 36.0–46.0)
HEMOGLOBIN: 11.3 g/dL — ABNORMAL LOW (ref 13.5–16.0)
LARGE UNSTAINED CELLS: 6 % — ABNORMAL HIGH (ref 0–4)
LYMPHOCYTES ABSOLUTE COUNT: 0.7 10*9/L — ABNORMAL LOW (ref 1.5–5.0)
LYMPHOCYTES RELATIVE PERCENT: 36.8 %
MEAN CORPUSCULAR HEMOGLOBIN CONC: 33.6 g/dL (ref 31.0–37.0)
MEAN CORPUSCULAR HEMOGLOBIN: 30.4 pg (ref 26.0–34.0)
MEAN CORPUSCULAR VOLUME: 90.5 fL (ref 80.0–100.0)
MEAN PLATELET VOLUME: 10.4 fL — ABNORMAL HIGH (ref 7.0–10.0)
MONOCYTES ABSOLUTE COUNT: 0.1 10*9/L — ABNORMAL LOW (ref 0.2–0.8)
MONOCYTES RELATIVE PERCENT: 5.8 %
NEUTROPHILS ABSOLUTE COUNT: 0.9 10*9/L — ABNORMAL LOW (ref 2.0–7.5)
NEUTROPHILS RELATIVE PERCENT: 49.2 %
RED BLOOD CELL COUNT: 3.72 10*12/L — ABNORMAL LOW (ref 4.00–5.20)
RED CELL DISTRIBUTION WIDTH: 18.9 % — ABNORMAL HIGH (ref 12.0–15.0)
WBC ADJUSTED: 1.8 10*9/L — ABNORMAL LOW (ref 4.5–11.0)

## 2018-04-19 LAB — SMEAR REVIEW

## 2018-04-19 LAB — NEUTROPHIL LEFT SHIFT

## 2018-04-19 LAB — SLIDE REVIEW

## 2018-04-19 NOTE — Unmapped (Signed)
If you feel like this is an emergency please call 911.  For appointments or questions Monday through Friday 8AM-5PM please call [phone] or Toll Free [phone]. For Medical questions or concerns ask for the Nurse Triage Line.  On Nights, Weekends, and Holidays call [phone] and ask for the Oncologist on Call.  Reasons to call the Nurse Triage Line:  Fever of 100.5 or greater  Nausea and/or vomiting not relieved with nausea medicine  Diarrhea or constipation  Severe pain not relieved with usual pain regimen  Shortness of breath  Uncontrolled bleeding  Mental status changes    Infusion on 04/19/2018   Component Date Value Ref Range Status   ??? WBC 04/19/2018 1.8* 4.5 - 11.0 10*9/L Final   ??? RBC 04/19/2018 3.72* 4.00 - 5.20 10*12/L Final   ??? HGB 04/19/2018 11.3* 13.5 - 16.0 g/dL Final   ??? HCT 57/84/6962 33.7* 36.0 - 46.0 % Final   ??? MCV 04/19/2018 90.5  80.0 - 100.0 fL Final   ??? MCH 04/19/2018 30.4  26.0 - 34.0 pg Final   ??? MCHC 04/19/2018 33.6  31.0 - 37.0 g/dL Final   ??? RDW 95/28/4132 18.9* 12.0 - 15.0 % Final   ??? MPV 04/19/2018 10.4* 7.0 - 10.0 fL Final   ??? Platelet 04/19/2018 24* 150 - 440 10*9/L Final   ??? Variable HGB Concentration 04/19/2018 Slight* Not Present Final   ??? Neutrophils % 04/19/2018 49.2  % Final   ??? Lymphocytes % 04/19/2018 36.8  % Final   ??? Monocytes % 04/19/2018 5.8  % Final   ??? Eosinophils % 04/19/2018 0.7  % Final   ??? Basophils % 04/19/2018 1.1  % Final   ??? Neutrophil Left Shift 04/19/2018 1+* Not Present Final   ??? Absolute Neutrophils 04/19/2018 0.9* 2.0 - 7.5 10*9/L Final   ??? Absolute Lymphocytes 04/19/2018 0.7* 1.5 - 5.0 10*9/L Final   ??? Absolute Monocytes 04/19/2018 0.1* 0.2 - 0.8 10*9/L Final   ??? Absolute Eosinophils 04/19/2018 0.0  0.0 - 0.4 10*9/L Final   ??? Absolute Basophils 04/19/2018 0.0  0.0 - 0.1 10*9/L Final   ??? Large Unstained Cells 04/19/2018 6* 0 - 4 % Final   ??? Anisocytosis 04/19/2018 Moderate* Not Present Final

## 2018-04-19 NOTE — Unmapped (Signed)
Pt denies any issues today. Port accessed. Labs collected and sent. ANC 0.9. Platelets 24. Given granix per therapy plan orders. Tolerated well. Port hep locked and deaccessed. Pt given AVS and discharged in NAD.

## 2018-04-22 ENCOUNTER — Ambulatory Visit: Admit: 2018-04-22 | Discharge: 2018-04-22 | Payer: MEDICARE | Attending: Adult Health | Primary: Adult Health

## 2018-04-22 ENCOUNTER — Other Ambulatory Visit: Admit: 2018-04-22 | Discharge: 2018-04-22 | Payer: MEDICARE

## 2018-04-22 ENCOUNTER — Ambulatory Visit: Admit: 2018-04-22 | Discharge: 2018-04-22 | Payer: MEDICARE

## 2018-04-22 DIAGNOSIS — R946 Abnormal results of thyroid function studies: Secondary | ICD-10-CM

## 2018-04-22 DIAGNOSIS — R5383 Other fatigue: Secondary | ICD-10-CM

## 2018-04-22 DIAGNOSIS — C9110 Chronic lymphocytic leukemia of B-cell type not having achieved remission: Secondary | ICD-10-CM

## 2018-04-22 DIAGNOSIS — R7989 Other specified abnormal findings of blood chemistry: Secondary | ICD-10-CM

## 2018-04-22 LAB — COMPREHENSIVE METABOLIC PANEL
ALBUMIN: 3.6 g/dL (ref 3.5–5.0)
ALKALINE PHOSPHATASE: 79 U/L (ref 38–126)
ALT (SGPT): 13 U/L (ref <35)
ANION GAP: 6 mmol/L — ABNORMAL LOW (ref 7–15)
AST (SGOT): 20 U/L (ref 14–38)
BILIRUBIN TOTAL: 0.4 mg/dL (ref 0.0–1.2)
BLOOD UREA NITROGEN: 15 mg/dL (ref 7–21)
BUN / CREAT RATIO: 19
CALCIUM: 8.9 mg/dL (ref 8.5–10.2)
CHLORIDE: 110 mmol/L — ABNORMAL HIGH (ref 98–107)
CO2: 23 mmol/L (ref 22.0–30.0)
CREATININE: 0.8 mg/dL (ref 0.60–1.00)
EGFR CKD-EPI NON-AA FEMALE: 70 mL/min/{1.73_m2} (ref >=60)
GLUCOSE RANDOM: 95 mg/dL (ref 70–179)
POTASSIUM: 3.9 mmol/L (ref 3.5–5.0)
PROTEIN TOTAL: 6.2 g/dL — ABNORMAL LOW (ref 6.5–8.3)

## 2018-04-22 LAB — CBC W/ AUTO DIFF
BASOPHILS ABSOLUTE COUNT: 0 10*9/L (ref 0.0–0.1)
BASOPHILS RELATIVE PERCENT: 0.8 %
EOSINOPHILS ABSOLUTE COUNT: 0 10*9/L (ref 0.0–0.4)
EOSINOPHILS RELATIVE PERCENT: 0.7 %
HEMATOCRIT: 34.3 % — ABNORMAL LOW (ref 36.0–46.0)
HEMOGLOBIN: 11.5 g/dL — ABNORMAL LOW (ref 12.0–16.0)
LARGE UNSTAINED CELLS: 4 % (ref 0–4)
LYMPHOCYTES ABSOLUTE COUNT: 0.8 10*9/L — ABNORMAL LOW (ref 1.5–5.0)
LYMPHOCYTES RELATIVE PERCENT: 27.6 %
MEAN CORPUSCULAR HEMOGLOBIN: 30.7 pg (ref 26.0–34.0)
MEAN CORPUSCULAR VOLUME: 91 fL (ref 80.0–100.0)
MEAN PLATELET VOLUME: 10.7 fL — ABNORMAL HIGH (ref 7.0–10.0)
MONOCYTES ABSOLUTE COUNT: 0.2 10*9/L (ref 0.2–0.8)
MONOCYTES RELATIVE PERCENT: 6.2 %
NEUTROPHILS RELATIVE PERCENT: 60.3 %
PLATELET COUNT: 26 10*9/L — ABNORMAL LOW (ref 150–440)
RED BLOOD CELL COUNT: 3.77 10*12/L — ABNORMAL LOW (ref 4.00–5.20)
RED CELL DISTRIBUTION WIDTH: 19.6 % — ABNORMAL HIGH (ref 12.0–15.0)
WBC ADJUSTED: 3 10*9/L — ABNORMAL LOW (ref 4.5–11.0)

## 2018-04-22 LAB — RED CELL DISTRIBUTION WIDTH: Lab: 19.6 — ABNORMAL HIGH

## 2018-04-22 LAB — CALCIUM: Calcium:MCnc:Pt:Ser/Plas:Qn:: 8.9

## 2018-04-22 LAB — URIC ACID: Urate:MCnc:Pt:Ser/Plas:Qn:: 3.3

## 2018-04-22 LAB — T4, FREE: FREE T4: 0.87 ng/dL (ref 0.71–1.40)

## 2018-04-22 LAB — THYROID STIMULATING HORMONE: Thyrotropin:ACnc:Pt:Ser/Plas:Qn:: 3.81 — ABNORMAL HIGH

## 2018-04-22 LAB — PHOSPHORUS: Phosphate:MCnc:Pt:Ser/Plas:Qn:: 4.2

## 2018-04-22 LAB — FREE T4: Thyroxine.free:MCnc:Pt:Ser/Plas:Qn:: 0.87

## 2018-04-22 NOTE — Unmapped (Signed)
1610 Pt in lab for port access for labs for possible transfusion today. Port accessed using sterile technique and labs drawn from line. Pt left ambulatory to MD appointment at [phone].

## 2018-04-22 NOTE — Unmapped (Signed)
She stopped the chemo therapy. Pharmacy called her to remind her to pick it up. The Doctor needs to contact the pharmacy to get this prescription    Patient states waking up sweating frequently

## 2018-04-22 NOTE — Unmapped (Signed)
[facility] Follow Up       Patient Name: Katrina Gould  Patient Age: 77 y.o.  Encounter Date: 04/22/2018      Chief complaint/Reason for visit: CLL/SLL    Assessment:  Katrina Gould is a 77 y.o. female who presents for follow of her CLL/SLL, Rai stage 4. Disease markers: 46,XX,der(4)t(4;4)(p14;q21). IGHV unmutated (0%, 3-33*01). B25m 5.05. No TP53 mutation. High-risk by CLL-IPI (6 points).    Katrina Gould is still doing fair overall.  Still holding venetoclax for thrombocytopenia and neutropenia.  Waiting on manufacturer's assistance program for eltrombopag.  Continue to support with Granix and platelets.    Venetoclax has been on hold for 2 weeks at this point, and did not get last dose of obinutuzumab.    Continued fatigue.  TSH slightly elevated, T4 WNL.  Will have her f/u with PCP for subclinical hypothyroid.    Plans and Recommendations:  1. 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 Diarrhea  - one episode recently    3. Pain related to falls  - continue tylenol, ice, or heat as needed for pain relief  - cortisone shot to shoulder recently - improved    4. Health Maintenance  - flu shot given this season  - continue annual dermatology follow up  - 1st Shingrix vaccine was given 06/2017; patient will need second vaccine, but recommend that she get this done at a pharmacy or at her PCP's office for insurance purposes    Due to this patient's diagnosis, she is at significant risk for subsequent morbidity and/or mortality.    Markus Jarvis, AGPCNP-C, MSN, OCN  Nurse Practitioner  Hematologic Malignancies  [facility]  [phone] (phone)  [phone] (fax)  [email or web address]          Interval History:  Doing fair since being seen last.  Reports some new night sweats around her neck. Still has residual cough/sputum.  She had one episode of diarrhea last night, but has not had any since.  Did not take imodium.  Appetite is good.    Otherwise, she denies new constitutional symptoms such as anorexia, weight loss, drenching night sweats or unexplained fevers.  Furthermore, she denies symptoms of marrow failure:  recurrent or unexplained intercurrent infections.  There have been no new or unexplained pains or self-identified masses, swelling or enlarged lymph nodes.    Past Medical, Surgical and Family History were reviewed and pertinent updates were made in the Electronic Medical Record  ??  Review of Systems:  Other than as reported above in the interim history, the other systems reviewed were unremarkable.  ??  ECOG Performance Status: 1     Oncology History:    Oncology History    CLL    First seen in oncology clinic locally 03/31/16    Had a left axillary biopsy 03/15/16 that confirmed CLL/SLL: Flow showed monoclonal B-cell population with CD5, CD23, kappa light chain. IHC showed positive CD20 (diffuse), negative for cyclin D1.    No constitutional symptoms except some sweats    Labs: Hb 12.0, Hct 34.8, plt 147. WBC was 10.6 with ANC 2.5. ALC was 7.5K.    CLL FISH studies: normal panel. No cells with 11q, tri 12, 17p  or 13q, and the 11;14 translocation also not detected.    Karyotype: 46,XX,der(4)t(4;4)(p14;q21)[3]/45,XX,dic(6;17)(q12;p11.2) (no loss of TP53 on FISH)    Molecular: No TP53 mutation    CLL-IPI: As per Lancet Oncol Vol 15 October 2014  Includes (1) TP53 status (no abnormalities vs. 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 MRD = 0.32%.            CLL (chronic lymphocytic leukemia) (CMS-HCC)    05/16/2016 Initial Diagnosis     CLL (chronic lymphocytic leukemia) (RAF-HCC)      09/14/2017 - 10/05/2017 Chemotherapy     Rituximab weekly x4  No response      10/23/2017 -  Chemotherapy     Began ven/obi      03/19/2018 Adverse Reaction     Obinutuzumab held given persistent thrombocytopenia. Marrow performed on 03/22/18, patient with no overt CLL (0.32% by MRD flow testing from [facility]). Do not plan to give 6th cycle of obi (unless counts recover in a reasonable timeframe).      04/09/2018 Adverse Reaction     ANC down to 0.2. Held venetoclax (was previously at 200 mg daily).        B-cell chronic lymphocytic leukemia (CMS-HCC)    09/05/2017 - 10/11/2017 Chemotherapy     Chemotherapy Treatment    Treatment Goal Control   Line of Treatment [No plan line of treatment]   Plan Name OP RITUXIMAB    Start Date 09/14/2017   End Date 10/05/2017   Provider Pernell Dupre, MD   Chemotherapy riTUXimab (RITUXAN) 712.5 mg in sodium chloride (NS) 0.9 % 500 mL IVPB, 375 mg/m2 = 712.5 mg, Intravenous, Once, 1 of 1 cycle  Administration: 712.5 mg (09/14/2017)  riTUXimab (RITUXAN) 712.5 mg in sodium chloride (NS) 0.9 % 250 mL rapid infusion, 375 mg/m2 = 712.5 mg, Intravenous, Once, 1 of 1 cycle  Administration: 712.5 mg (09/21/2017), 712.5 mg (09/28/2017), 712.5 mg (10/05/2017)         10/16/2017 Initial Diagnosis     Chronic lymphocytic leukemia, Rai stage IV (CMS-HCC)      10/23/2017 -  Chemotherapy     OP OBINUTUZUMAB AND VENETOCLAX  chlorambucil 0.5 mg/kg PO on days 1, 15, obinutuzumab 100 mg IV on day 1, obinutuzumab 900 mg IV on day 2, obinutuzumab 1,000 mg IV on days 8,15 on Cycle 1, then obinutuzumab 1,000 mg IV on day 1 for sequent cycles, every 28 days.         Allergies   Allergen Reactions   ??? Baclofen      made me go crazy         Current Outpatient  Medications   Medication Sig Dispense Refill   ??? allopurinol (ZYLOPRIM) 300 MG tablet Take 1 tablet (300 mg total) by mouth daily. 30 tablet 2   ??? cholestyramine (QUESTRAN) 4 gram packet Take 1 packet by mouth Three (3) times a day with a meal. 90 each 12   ??? eltrombopag (PROMACTA) 25 MG tablet Take 2 tablets (50 mg total) by mouth daily. Administer on an empty stomach, 1 hour before or 2 hours after a meal. 60 tablet 2   ??? fesoterodine (TOVIAZ) 4 mg 24 hr tablet Take by mouth Take as directed.      ??? fluticasone propion-salmeterol (ADVAIR) 100-50 mcg/dose diskus Inhale.     ??? loperamide (IMODIUM A-D) 2 mg tablet Take 2 mg by mouth Three (3) times a day as needed for diarrhea.     ??? LORazepam (ATIVAN) 0.5 MG tablet Take 0.5 mg by mouth two (2) times a day as needed.      ??? mirtazapine (REMERON) 7.5 MG tablet Take 7.5 mg by mouth nightly.     ??? multivitamin (TAB-A-VITE/THERAGRAN) per tablet Take 1 tablet by mouth daily.      ??? oxyCODONE (ROXICODONE) 5 MG immediate release tablet Take 1 tablet (5 mg total) by mouth every eight (8) hours as needed for pain. 20 tablet 0   ??? pantoprazole (PROTONIX) 40 MG tablet Take by mouth daily at 0600.      ??? pravastatin (PRAVACHOL) 80 MG tablet TAKE 1 TABLET BY MOUTH EVERY NIGHT AT BEDTIME     ??? predniSONE (DELTASONE) 10 MG tablet 6 TABS X 1 DAY, 5 TABS X 1 DAY, 4 TABS X 1 DAY, ETC...  0   ??? sertraline (ZOLOFT) 50 MG tablet Take 50 mg by mouth daily.  1   ??? topiramate (TOPAMAX) 50 MG tablet Take 50 mg by mouth daily.  3   ??? triamcinolone (KENALOG) 0.1 % cream Apply topically Two (2) times a day. 30 g 1   ??? venetoclax (VENCLEXTA) 100 mg tablet Take 2 tablets (200 mg total) by mouth daily. 60 each 6   ??? ranitidine (ZANTAC) 150 MG capsule Take 1 capsule (150 mg total) by mouth Two (2) times a day. (Patient not taking: Reported on 03/19/2018) 180 capsule 3     No current facility-administered medications for this visit.      Facility-Administered Medications Ordered in Other Visits   Medication Dose Route Frequency Provider Last Rate Last Dose   ??? heparin, porcine (PF) 100 unit/mL injection 500 Units  500 Units Intravenous Q30 Min PRN Pernell Dupre, MD   500 Units at 04/22/18 0840     Physical exam:  Vitals:    04/22/18 0924   BP: 143/67   Pulse: 60   Temp: 36.4 ??C (97.6 ??F)   SpO2: 99%     Constitutional: Resting, in no apparent distress  Eyes:  No scleral icterus or conjunctival injection.  Ear/nose/mouth/throat: Oral mucosa without ulceration, erythema or exudate.   Hematology/lymphatic/immunologic:  No lymphadenopathy in the anterior/posterior cervical, supraclavicular basins.  Cardiovascular:  RRR.  S1, S2.  No murmurs, gallops or rubs. Appear well-perfused.  No clubbing or cyanosis. 1+ edema to bilateral ankles and feet.  Respiratory:  Breathing is unlabored, and patient is speaking full sentences with ease.  No stridor.  CTAB. No rales, ronchi or crackles.     GI:  No distention. Bowel sounds are present and normal in quality.  Mild pain on palpation of right upper quadrant. No palpable hepatomegaly  or splenomegaly.  No palpable masses.   Musculoskeletal: . No pain on palpation of the spinous processes of the cervical, thoracic or lumbar vertebral bodies. No grossly-evident joint effusions or deformities.  Range of motion about the shoulder, elbow, hips and knees is grossly normal.    Skin:  No rashes or areas of skin breakdown.  Warm to touch, dry, smooth and even.  Neurologic:  Gait is normal.  Cerebellar tasks are completed with ease and are symmetric.  Psychiatric:  Alert and oriented to person, place, time and situation.  Range of affect is appropriate.      ECOG Performance Status: 1    Results:  Labs and pathology have been reviewed and pertinent results are as follows:    Results for orders placed or performed in visit on 04/22/18   Comprehensive Metabolic Panel   Result Value Ref Range    Sodium 139 135 - 145 mmol/L    Potassium 3.9 3.5 - 5.0 mmol/L    Chloride 110 (H) 98 - 107 mmol/L    CO2 23.0 22.0 - 30.0 mmol/L    BUN 15 7 - 21 mg/dL    Creatinine 1.61 0.96 - 1.00 mg/dL    BUN/Creatinine Ratio 19     EGFR CKD-EPI Non-African American, Female 70 >=60 mL/min/1.72m2    EGFR CKD-EPI African American, Female 56 >=60 mL/min/1.64m2    Glucose 95 70 - 179 mg/dL    Calcium 8.9 8.5 - 04.5 mg/dL    Albumin 3.6 3.5 - 5.0 g/dL    Total Protein 6.2 (L) 6.5 - 8.3 g/dL    Total Bilirubin 0.4 0.0 - 1.2 mg/dL    AST 20 14 - 38 U/L    ALT 13 <35 U/L    Alkaline Phosphatase 79 38 - 126 U/L    Anion Gap 6 (L) 7 - 15 mmol/L   Uric acid   Result Value Ref Range    Uric Acid 3.3 3.0 - 6.5 mg/dL   Phosphorus Level   Result Value Ref Range    Phosphorus 4.2 2.9 - 4.7 mg/dL   Type and Screen   Result Value Ref Range    ABO Grouping B NEG     Antibody Screen NEG    CBC w/ Differential   Result Value Ref Range    WBC 3.0 (L) 4.5 - 11.0 10*9/L    RBC 3.77 (L) 4.00 - 5.20 10*12/L    HGB 11.5 (L) 12.0 - 16.0 g/dL    HCT 40.9 (L) 81.1 - 46.0 %    MCV 91.0 80.0 - 100.0 fL    MCH 30.7 26.0 - 34.0 pg    MCHC 33.7 31.0 - 37.0 g/dL    RDW 91.4 (H) 78.2 - 15.0 %    MPV 10.7 (H) 7.0 - 10.0 fL    Platelet 26 (L) 150 - 440 10*9/L    Neutrophils % 60.3 %    Lymphocytes % 27.6 %    Monocytes % 6.2 %    Eosinophils % 0.7 %    Basophils % 0.8 %    Absolute Neutrophils 1.8 (L) 2.0 - 7.5 10*9/L    Absolute Lymphocytes 0.8 (L) 1.5 - 5.0 10*9/L    Absolute Monocytes 0.2 0.2 - 0.8 10*9/L    Absolute Eosinophils 0.0 0.0 - 0.4 10*9/L    Absolute Basophils 0.0 0.0 - 0.1 10*9/L    Large Unstained Cells 4 0 - 4 %    Macrocytosis Slight (A) Not Present  Anisocytosis Moderate (A) Not Present    Hypochromasia Slight (A) Not Present

## 2018-04-26 ENCOUNTER — Ambulatory Visit: Admit: 2018-04-26 | Discharge: 2018-04-27 | Payer: MEDICARE

## 2018-04-26 ENCOUNTER — Other Ambulatory Visit: Admit: 2018-04-26 | Discharge: 2018-04-27 | Payer: MEDICARE

## 2018-04-26 DIAGNOSIS — C9110 Chronic lymphocytic leukemia of B-cell type not having achieved remission: Secondary | ICD-10-CM

## 2018-04-26 DIAGNOSIS — D701 Agranulocytosis secondary to cancer chemotherapy: Principal | ICD-10-CM

## 2018-04-26 DIAGNOSIS — T451X5A Adverse effect of antineoplastic and immunosuppressive drugs, initial encounter: Secondary | ICD-10-CM

## 2018-04-26 LAB — SMEAR REVIEW

## 2018-04-26 LAB — CBC W/ AUTO DIFF
BASOPHILS ABSOLUTE COUNT: 0 10*9/L (ref 0.0–0.1)
BASOPHILS RELATIVE PERCENT: 0.6 %
EOSINOPHILS ABSOLUTE COUNT: 0 10*9/L (ref 0.0–0.4)
EOSINOPHILS RELATIVE PERCENT: 2.5 %
HEMATOCRIT: 36.1 % (ref 36.0–46.0)
LARGE UNSTAINED CELLS: 4 % (ref 0–4)
LYMPHOCYTES RELATIVE PERCENT: 50.8 %
MEAN CORPUSCULAR HEMOGLOBIN CONC: 33.3 g/dL (ref 31.0–37.0)
MEAN CORPUSCULAR HEMOGLOBIN: 30.4 pg (ref 26.0–34.0)
MEAN CORPUSCULAR VOLUME: 91.1 fL (ref 80.0–100.0)
MEAN PLATELET VOLUME: 10.2 fL — ABNORMAL HIGH (ref 7.0–10.0)
MONOCYTES ABSOLUTE COUNT: 0.2 10*9/L (ref 0.2–0.8)
NEUTROPHILS ABSOLUTE COUNT: 0.4 10*9/L — CL (ref 2.0–7.5)
NEUTROPHILS RELATIVE PERCENT: 29.9 %
PLATELET COUNT: 30 10*9/L — ABNORMAL LOW (ref 150–440)
RED BLOOD CELL COUNT: 3.96 10*12/L — ABNORMAL LOW (ref 4.00–5.20)
WBC ADJUSTED: 1.4 10*9/L — ABNORMAL LOW (ref 4.5–11.0)

## 2018-04-26 LAB — WBC ADJUSTED: Lab: 1.4 — ABNORMAL LOW

## 2018-04-26 NOTE — Unmapped (Signed)
No transfusions needed today.  Granix injection given.  Discharged home @ 1357 ambulatory, steady gait, unaccompanied.

## 2018-04-26 NOTE — Unmapped (Signed)
Lab on 04/26/2018   Component Date Value Ref Range Status   ??? WBC 04/26/2018 1.4* 4.5 - 11.0 10*9/L Final   ??? RBC 04/26/2018 3.96* 4.00 - 5.20 10*12/L Final   ??? HGB 04/26/2018 12.0  12.0 - 16.0 g/dL Final   ??? HCT 16/01/9603 36.1  36.0 - 46.0 % Final   ??? MCV 04/26/2018 91.1  80.0 - 100.0 fL Final   ??? MCH 04/26/2018 30.4  26.0 - 34.0 pg Final   ??? MCHC 04/26/2018 33.3  31.0 - 37.0 g/dL Final   ??? RDW 54/12/8117 18.5* 12.0 - 15.0 % Final   ??? MPV 04/26/2018 10.2* 7.0 - 10.0 fL Final   ??? Platelet 04/26/2018 30* 150 - 440 10*9/L Final   ??? Variable HGB Concentration 04/26/2018 Slight* Not Present Final   ??? Neutrophils % 04/26/2018 29.9  % Final   ??? Lymphocytes % 04/26/2018 50.8  % Final   ??? Monocytes % 04/26/2018 12.3  % Final   ??? Eosinophils % 04/26/2018 2.5  % Final   ??? Basophils % 04/26/2018 0.6  % Final   ??? Absolute Neutrophils 04/26/2018 0.4* 2.0 - 7.5 10*9/L Final   ??? Absolute Lymphocytes 04/26/2018 0.7* 1.5 - 5.0 10*9/L Final   ??? Absolute Monocytes 04/26/2018 0.2  0.2 - 0.8 10*9/L Final   ??? Absolute Eosinophils 04/26/2018 0.0  0.0 - 0.4 10*9/L Final   ??? Absolute Basophils 04/26/2018 0.0  0.0 - 0.1 10*9/L Final   ??? Large Unstained Cells 04/26/2018 4  0 - 4 % Final   ??? Macrocytosis 04/26/2018 Slight* Not Present Final   ??? Anisocytosis 04/26/2018 Moderate* Not Present Final   ??? Hypochromasia 04/26/2018 Slight* Not Present Final   ??? Smear Review Comments 04/26/2018 See Comment* Undefined Final    Slide reviewed.

## 2018-04-30 ENCOUNTER — Ambulatory Visit: Admit: 2018-04-30 | Discharge: 2018-05-01 | Payer: MEDICARE

## 2018-04-30 ENCOUNTER — Ambulatory Visit: Admit: 2018-04-30 | Discharge: 2018-05-01 | Payer: MEDICARE | Attending: Adult Health | Primary: Adult Health

## 2018-04-30 ENCOUNTER — Other Ambulatory Visit: Admit: 2018-04-30 | Discharge: 2018-05-01 | Payer: MEDICARE

## 2018-04-30 DIAGNOSIS — C9110 Chronic lymphocytic leukemia of B-cell type not having achieved remission: Secondary | ICD-10-CM

## 2018-04-30 DIAGNOSIS — D701 Agranulocytosis secondary to cancer chemotherapy: Secondary | ICD-10-CM

## 2018-04-30 DIAGNOSIS — D6959 Other secondary thrombocytopenia: Secondary | ICD-10-CM

## 2018-04-30 DIAGNOSIS — T451X5A Adverse effect of antineoplastic and immunosuppressive drugs, initial encounter: Secondary | ICD-10-CM

## 2018-04-30 DIAGNOSIS — E876 Hypokalemia: Secondary | ICD-10-CM

## 2018-04-30 LAB — CHLORIDE: Chloride:SCnc:Pt:Ser/Plas:Qn:: 108 — ABNORMAL HIGH

## 2018-04-30 LAB — COMPREHENSIVE METABOLIC PANEL
ALBUMIN: 3.5 g/dL (ref 3.5–5.0)
ALKALINE PHOSPHATASE: 77 U/L (ref 38–126)
ALT (SGPT): 17 U/L (ref <35)
AST (SGOT): 25 U/L (ref 14–38)
BILIRUBIN TOTAL: 0.4 mg/dL (ref 0.0–1.2)
BLOOD UREA NITROGEN: 19 mg/dL (ref 7–21)
BUN / CREAT RATIO: 28
CALCIUM: 8.8 mg/dL (ref 8.5–10.2)
CHLORIDE: 108 mmol/L — ABNORMAL HIGH (ref 98–107)
CO2: 23 mmol/L (ref 22.0–30.0)
CREATININE: 0.69 mg/dL (ref 0.60–1.00)
EGFR CKD-EPI AA FEMALE: >90 mL/min/{1.73_m2} (ref >=60)
GLUCOSE RANDOM: 113 mg/dL (ref 70–179)
POTASSIUM: 3.3 mmol/L — ABNORMAL LOW (ref 3.5–5.0)
SODIUM: 139 mmol/L (ref 135–145)

## 2018-04-30 LAB — URIC ACID: Urate:MCnc:Pt:Ser/Plas:Qn:: 3.7

## 2018-04-30 LAB — CBC W/ AUTO DIFF
BASOPHILS ABSOLUTE COUNT: 0 10*9/L (ref 0.0–0.1)
BASOPHILS RELATIVE PERCENT: 1 %
EOSINOPHILS ABSOLUTE COUNT: 0.1 10*9/L (ref 0.0–0.4)
EOSINOPHILS RELATIVE PERCENT: 4.9 %
HEMATOCRIT: 35 % — ABNORMAL LOW (ref 36.0–46.0)
LARGE UNSTAINED CELLS: 6 % — ABNORMAL HIGH (ref 0–4)
LYMPHOCYTES ABSOLUTE COUNT: 0.9 10*9/L — ABNORMAL LOW (ref 1.5–5.0)
LYMPHOCYTES RELATIVE PERCENT: 44.1 %
MEAN CORPUSCULAR HEMOGLOBIN CONC: 32.7 g/dL (ref 31.0–37.0)
MEAN CORPUSCULAR HEMOGLOBIN: 29.8 pg (ref 26.0–34.0)
MEAN CORPUSCULAR VOLUME: 91.3 fL (ref 80.0–100.0)
MEAN PLATELET VOLUME: 10.7 fL — ABNORMAL HIGH (ref 7.0–10.0)
MONOCYTES ABSOLUTE COUNT: 0.3 10*9/L (ref 0.2–0.8)
MONOCYTES RELATIVE PERCENT: 14 %
NEUTROPHILS ABSOLUTE COUNT: 0.6 10*9/L — ABNORMAL LOW (ref 2.0–7.5)
NEUTROPHILS RELATIVE PERCENT: 30.3 %
PLATELET COUNT: 22 10*9/L — ABNORMAL LOW (ref 150–440)
RED BLOOD CELL COUNT: 3.83 10*12/L — ABNORMAL LOW (ref 4.00–5.20)
RED CELL DISTRIBUTION WIDTH: 18.4 % — ABNORMAL HIGH (ref 12.0–15.0)
WBC ADJUSTED: 1.9 10*9/L — ABNORMAL LOW (ref 4.5–11.0)

## 2018-04-30 LAB — BASOPHILS ABSOLUTE COUNT: Lab: 0

## 2018-04-30 LAB — PHOSPHORUS: Phosphate:MCnc:Pt:Ser/Plas:Qn:: 4.4

## 2018-04-30 LAB — SMEAR REVIEW

## 2018-04-30 MED ORDER — POTASSIUM CHLORIDE ER 10 MEQ TABLET, EXTENDED RELEASE WRAPPER
ORAL_TABLET | Freq: Every day | ORAL | 0 refills | 0 days | Status: CP
Start: 2018-04-30 — End: 2018-05-07

## 2018-04-30 NOTE — Unmapped (Signed)
[facility] Follow Up       Patient Name: Katrina Gould  Patient Age: 77 y.o.  Encounter Date: 04/30/2018      Chief complaint/Reason for visit: CLL/SLL    Assessment:  Katrina Gould is a 77 y.o. female who presents for follow of her CLL/SLL, Rai stage 4. Disease markers: 46,XX,der(4)t(4;4)(p14;q21). IGHV unmutated (0%, 3-33*01). B31m 5.05. No TP53 mutation. High-risk by CLL-IPI (6 points).    [name] continues to do fairly well.  She has stable complaints today, unrelated to her CLL.  We are still waiting for manufacturer's assistance for the eltrombopag prescription.    Advised her to follow up ASAP with PCP for dizziness - this was prior to chemo, longstanding, and has not changed off of chemo.      Plans and Recommendations:  1. 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 Diarrhea  - resolved    3. Pain related to falls  - continue tylenol, ice, or heat as needed for pain relief    4. Hypokalemia  - mild, unclear etiology  - initiate potassium daily x 7 days    5. Health Maintenance  - flu shot given this season  - continue annual dermatology follow up  - 1st Shingrix vaccine was given 06/2017; patient will need second vaccine, but recommend that she get this done at a pharmacy or at her PCP's office for insurance purposes    Due to this patient's diagnosis, she is at significant risk for subsequent morbidity and/or mortality.    Markus Jarvis, AGPCNP-C, MSN, OCN  Nurse Practitioner  Hematologic Malignancies  [facility]  [phone] (phone)  [phone] (fax)  [email or web address]          Interval History:  Doing fair since being seen last.  States that yesterday she almost fell due to dizziness.  Also had some dizziness this morning too.  She has not seen her PCP for this.  Diarrhea has resolved since being off Venetoclax.    Otherwise, she denies new constitutional symptoms such as anorexia, weight loss, drenching night sweats or unexplained fevers.  Furthermore, she denies symptoms of marrow failure:  recurrent or unexplained intercurrent infections.  There have been no new or unexplained pains or self-identified masses, swelling or enlarged lymph nodes.    Past Medical, Surgical and Family History were reviewed and pertinent updates were made in the Electronic Medical Record  ??  Review of Systems:  Other than as reported above in the interim history, the other systems reviewed were unremarkable.  ??  ECOG Performance Status: 1     Oncology History:    Oncology History    CLL    First seen in oncology clinic locally 03/31/16    Had a left axillary biopsy 03/15/16 that confirmed CLL/SLL: Flow showed monoclonal B-cell population with CD5, CD23, kappa light chain. IHC showed positive CD20 (diffuse), negative for cyclin D1.    No constitutional symptoms except some sweats    Labs: Hb 12.0, Hct 34.8, plt 147. WBC was 10.6 with ANC 2.5. ALC was 7.5K.    CLL FISH studies: normal panel. No cells with 11q, tri 12, 17p or 13q, and the 11;14 translocation also not detected.  Karyotype: 46,XX,der(4)t(4;4)(p14;q21)[3]/45,XX,dic(6;17)(q12;p11.2) (no loss of TP53 on FISH)    Molecular: No TP53 mutation    CLL-IPI: As per Lancet Oncol Vol 15 October 2014  Includes (1) TP53 status (no abnormalities vs. 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 MRD = 0.32%.            CLL (chronic lymphocytic leukemia) (CMS-HCC)    05/16/2016 Initial Diagnosis     CLL (chronic lymphocytic leukemia) (RAF-HCC)      09/14/2017 - 10/05/2017 Chemotherapy     Rituximab weekly x4  No response      10/23/2017 -  Chemotherapy     Began ven/obi      03/19/2018 Adverse Reaction     Obinutuzumab held given persistent thrombocytopenia. Marrow performed on 03/22/18, patient with no overt CLL (0.32% by MRD flow testing from [facility]). Do not plan to give 6th cycle of obi (unless counts recover in a reasonable timeframe).      04/09/2018 Adverse Reaction     ANC down to 0.2. Held venetoclax (was previously at 200 mg daily).        B-cell chronic lymphocytic leukemia (CMS-HCC)    09/05/2017 - 10/11/2017 Chemotherapy     Chemotherapy Treatment    Treatment Goal Control   Line of Treatment [No plan line of treatment]   Plan Name OP RITUXIMAB    Start Date 09/14/2017   End Date 10/05/2017   Provider Pernell Dupre, MD   Chemotherapy riTUXimab (RITUXAN) 712.5 mg in sodium chloride (NS) 0.9 % 500 mL IVPB, 375 mg/m2 = 712.5 mg, Intravenous, Once, 1 of 1 cycle  Administration: 712.5 mg (09/14/2017)  riTUXimab (RITUXAN) 712.5 mg in sodium chloride (NS) 0.9 % 250 mL rapid infusion, 375 mg/m2 = 712.5 mg, Intravenous, Once, 1 of 1 cycle  Administration: 712.5 mg (09/21/2017), 712.5 mg (09/28/2017), 712.5 mg (10/05/2017)         10/16/2017 Initial Diagnosis     Chronic lymphocytic leukemia, Rai stage IV (CMS-HCC)      10/23/2017 -  Chemotherapy     OP OBINUTUZUMAB AND VENETOCLAX  chlorambucil 0.5 mg/kg PO on days 1, 15, obinutuzumab 100 mg IV on day 1, obinutuzumab 900 mg IV on day 2, obinutuzumab 1,000 mg IV on days 8,15 on Cycle 1, then obinutuzumab 1,000 mg IV on day 1 for sequent cycles, every 28 days.         Allergies   Allergen Reactions   ??? Baclofen      made me go crazy         Current Outpatient Medications   Medication Sig Dispense Refill   ??? allopurinol (ZYLOPRIM)  300 MG tablet Take 1 tablet (300 mg total) by mouth daily. 30 tablet 2   ??? cholestyramine (QUESTRAN) 4 gram packet Take 1 packet by mouth Three (3) times a day with a meal. 90 each 12   ??? eltrombopag (PROMACTA) 25 MG tablet Take 2 tablets (50 mg total) by mouth daily. Administer on an empty stomach, 1 hour before or 2 hours after a meal. 60 tablet 2   ??? fesoterodine (TOVIAZ) 4 mg 24 hr tablet Take by mouth Take as directed.      ??? fluticasone propion-salmeterol (ADVAIR) 100-50 mcg/dose diskus Inhale.     ??? loperamide (IMODIUM A-D) 2 mg tablet Take 2 mg by mouth Three (3) times a day as needed for diarrhea.     ??? LORazepam (ATIVAN) 0.5 MG tablet Take 0.5 mg by mouth two (2) times a day as needed.      ??? mirtazapine (REMERON) 7.5 MG tablet Take 7.5 mg by mouth nightly.     ??? multivitamin (TAB-A-VITE/THERAGRAN) per tablet Take 1 tablet by mouth daily.      ??? oxyCODONE (ROXICODONE) 5 MG immediate release tablet Take 1 tablet (5 mg total) by mouth every eight (8) hours as needed for pain. 20 tablet 0   ??? pantoprazole (PROTONIX) 40 MG tablet Take by mouth daily at 0600.      ??? pravastatin (PRAVACHOL) 80 MG tablet TAKE 1 TABLET BY MOUTH EVERY NIGHT AT BEDTIME     ??? predniSONE (DELTASONE) 10 MG tablet 6 TABS X 1 DAY, 5 TABS X 1 DAY, 4 TABS X 1 DAY, ETC...  0   ??? sertraline (ZOLOFT) 50 MG tablet Take 50 mg by mouth daily.  1   ??? topiramate (TOPAMAX) 50 MG tablet Take 50 mg by mouth daily.  3   ??? triamcinolone (KENALOG) 0.1 % cream Apply topically Two (2) times a day. 30 g 1   ??? ranitidine (ZANTAC) 150 MG capsule Take 1 capsule (150 mg total) by mouth Two (2) times a day. (Patient not taking: Reported on 03/19/2018) 180 capsule 3   ??? venetoclax (VENCLEXTA) 100 mg tablet Take 2 tablets (200 mg total) by mouth daily. (Patient not taking: Reported on 04/30/2018) 60 each 6     No current facility-administered medications for this visit.      Physical exam:  Vitals:    04/30/18 0935   BP: 127/63   Pulse: 71   Resp: 16   Temp: 35.8 ??C (96.4 ??F)   SpO2: 97%     Constitutional: Resting, in no apparent distress  Eyes:  No scleral icterus or conjunctival injection.  Ear/nose/mouth/throat: Oral mucosa without ulceration, erythema or exudate.   Hematology/lymphatic/immunologic:  No lymphadenopathy in the anterior/posterior cervical, supraclavicular basins.  Cardiovascular:  RRR.  S1, S2.  No murmurs, gallops or rubs. Appear well-perfused.  No clubbing or cyanosis. 1+ edema to bilateral ankles and feet.  Respiratory:  Breathing is unlabored, and patient is speaking full sentences with ease.  No stridor.  CTAB. No rales, ronchi or crackles.     GI:  No distention. Bowel sounds are present and normal in quality.  Mild pain on palpation of right upper quadrant. No palpable hepatomegaly or splenomegaly.  No palpable masses.   Musculoskeletal: . No pain on palpation of the spinous processes of the cervical, thoracic or lumbar vertebral bodies. No grossly-evident joint effusions or deformities.  Range of motion about the shoulder, elbow, hips and knees is grossly normal.    Skin:  No  rashes or areas of skin breakdown.  Warm to touch, dry, smooth and even.  Neurologic:  Gait is normal.  Cerebellar tasks are completed with ease and are symmetric.  Psychiatric:  Alert and oriented to person, place, time and situation.  Range of affect is appropriate.      ECOG Performance Status: 1    Results:  Labs and pathology have been reviewed and pertinent results are as follows:    Results for orders placed or performed in visit on 04/30/18   Comprehensive Metabolic Panel   Result Value Ref Range    Sodium 139 135 - 145 mmol/L    Potassium 3.3 (L) 3.5 - 5.0 mmol/L    Chloride 108 (H) 98 - 107 mmol/L    CO2 23.0 22.0 - 30.0 mmol/L    BUN 19 7 - 21 mg/dL Creatinine 1.61 0.96 - 1.00 mg/dL    BUN/Creatinine Ratio 28     EGFR CKD-EPI Non-African American, Female 83 >=60 mL/min/1.43m2    EGFR CKD-EPI African American, Female >90 >=60 mL/min/1.47m2    Glucose 113 70 - 179 mg/dL    Calcium 8.8 8.5 - 04.5 mg/dL    Albumin 3.5 3.5 - 5.0 g/dL    Total Protein 5.9 (L) 6.5 - 8.3 g/dL    Total Bilirubin 0.4 0.0 - 1.2 mg/dL    AST 25 14 - 38 U/L    ALT 17 <35 U/L    Alkaline Phosphatase 77 38 - 126 U/L    Anion Gap 8 7 - 15 mmol/L   Uric acid   Result Value Ref Range    Uric Acid 3.7 3.0 - 6.5 mg/dL   Phosphorus Level   Result Value Ref Range    Phosphorus 4.4 2.9 - 4.7 mg/dL   CBC w/ Differential   Result Value Ref Range    WBC 1.9 (L) 4.5 - 11.0 10*9/L    RBC 3.83 (L) 4.00 - 5.20 10*12/L    HGB 11.4 (L) 12.0 - 16.0 g/dL    HCT 40.9 (L) 81.1 - 46.0 %    MCV 91.3 80.0 - 100.0 fL    MCH 29.8 26.0 - 34.0 pg    MCHC 32.7 31.0 - 37.0 g/dL    RDW 91.4 (H) 78.2 - 15.0 %    MPV 10.7 (H) 7.0 - 10.0 fL    Platelet 22 (L) 150 - 440 10*9/L    Variable HGB Concentration Slight (A) Not Present    Neutrophils % 30.3 %    Lymphocytes % 44.1 %    Monocytes % 14.0 %    Eosinophils % 4.9 %    Basophils % 1.0 %    Neutrophil Left Shift 1+ (A) Not Present    Absolute Neutrophils 0.6 (L) 2.0 - 7.5 10*9/L    Absolute Lymphocytes 0.9 (L) 1.5 - 5.0 10*9/L    Absolute Monocytes 0.3 0.2 - 0.8 10*9/L    Absolute Eosinophils 0.1 0.0 - 0.4 10*9/L    Absolute Basophils 0.0 0.0 - 0.1 10*9/L    Large Unstained Cells 6 (H) 0 - 4 %    Macrocytosis Slight (A) Not Present    Anisocytosis Moderate (A) Not Present    Hypochromasia Slight (A) Not Present

## 2018-04-30 NOTE — Unmapped (Signed)
Patient needs a refill of the allopurinal if she is to go back to taking the vendexta    Patient is having much difficulty with being lighted headed and woozy while standing. She is not currently using a walking device.

## 2018-04-30 NOTE — Unmapped (Signed)
Pt discharged from infusion area with no complications noted post inj.

## 2018-04-30 NOTE — Unmapped (Signed)
Labs drawn and sent for analysis.  Care provided by  A Wisniewski, RN

## 2018-05-03 ENCOUNTER — Ambulatory Visit: Admit: 2018-05-03 | Discharge: 2018-05-03 | Payer: MEDICARE

## 2018-05-03 DIAGNOSIS — T451X5A Adverse effect of antineoplastic and immunosuppressive drugs, initial encounter: Secondary | ICD-10-CM

## 2018-05-03 DIAGNOSIS — D701 Agranulocytosis secondary to cancer chemotherapy: Principal | ICD-10-CM

## 2018-05-03 DIAGNOSIS — C9110 Chronic lymphocytic leukemia of B-cell type not having achieved remission: Secondary | ICD-10-CM

## 2018-05-03 LAB — CBC W/ AUTO DIFF
BASOPHILS ABSOLUTE COUNT: 0 10*9/L (ref 0.0–0.1)
BASOPHILS RELATIVE PERCENT: 0.5 %
EOSINOPHILS ABSOLUTE COUNT: 0.1 10*9/L (ref 0.0–0.4)
EOSINOPHILS RELATIVE PERCENT: 3.1 %
HEMATOCRIT: 33 % — ABNORMAL LOW (ref 36.0–46.0)
HEMOGLOBIN: 11.4 g/dL — ABNORMAL LOW (ref 13.5–16.0)
LARGE UNSTAINED CELLS: 4 % (ref 0–4)
LYMPHOCYTES ABSOLUTE COUNT: 1 10*9/L — ABNORMAL LOW (ref 1.5–5.0)
LYMPHOCYTES RELATIVE PERCENT: 29.6 %
MEAN CORPUSCULAR HEMOGLOBIN CONC: 34.7 g/dL (ref 31.0–37.0)
MEAN CORPUSCULAR HEMOGLOBIN: 31.8 pg (ref 26.0–34.0)
MEAN CORPUSCULAR VOLUME: 91.7 fL (ref 80.0–100.0)
MEAN PLATELET VOLUME: 11.1 fL — ABNORMAL HIGH (ref 7.0–10.0)
MONOCYTES ABSOLUTE COUNT: 0.2 10*9/L (ref 0.2–0.8)
MONOCYTES RELATIVE PERCENT: 6.9 %
NEUTROPHILS ABSOLUTE COUNT: 1.9 10*9/L — ABNORMAL LOW (ref 2.0–7.5)
NEUTROPHILS RELATIVE PERCENT: 55.8 %
RED BLOOD CELL COUNT: 3.59 10*12/L — ABNORMAL LOW (ref 4.00–5.20)
RED CELL DISTRIBUTION WIDTH: 19.3 % — ABNORMAL HIGH (ref 12.0–15.0)
WBC ADJUSTED: 3.4 10*9/L — ABNORMAL LOW (ref 4.5–11.0)

## 2018-05-03 LAB — RED CELL DISTRIBUTION WIDTH: Lab: 19.3 — ABNORMAL HIGH

## 2018-05-03 NOTE — Unmapped (Signed)
If you feel like this is an emergency please call 911.  For appointments or questions Monday through Friday 8AM-5PM please call [phone] or Toll Free [phone]. For Medical questions or concerns ask for the Nurse Triage Line.  On Nights, Weekends, and Holidays call [phone] and ask for the Oncologist on Call.  Reasons to call the Nurse Triage Line:  Fever of 100.5 or greater  Nausea and/or vomiting not relieved with nausea medicine  Diarrhea or constipation  Severe pain not relieved with usual pain regimen  Shortness of breath  Uncontrolled bleeding  Mental status changes    Infusion on 05/03/2018   Component Date Value Ref Range Status   ??? WBC 05/03/2018 3.4* 4.5 - 11.0 10*9/L Final   ??? RBC 05/03/2018 3.59* 4.00 - 5.20 10*12/L Final   ??? HGB 05/03/2018 11.4* 13.5 - 16.0 g/dL Final   ??? HCT 57/84/6962 33.0* 36.0 - 46.0 % Final   ??? MCV 05/03/2018 91.7  80.0 - 100.0 fL Final   ??? MCH 05/03/2018 31.8  26.0 - 34.0 pg Final   ??? MCHC 05/03/2018 34.7  31.0 - 37.0 g/dL Final   ??? RDW 95/28/4132 19.3* 12.0 - 15.0 % Final   ??? MPV 05/03/2018 11.1* 7.0 - 10.0 fL Final   ??? Platelet 05/03/2018 23* 150 - 440 10*9/L Final   ??? Variable HGB Concentration 05/03/2018 Slight* Not Present Final   ??? Neutrophils % 05/03/2018 55.8  % Final   ??? Lymphocytes % 05/03/2018 29.6  % Final   ??? Monocytes % 05/03/2018 6.9  % Final   ??? Eosinophils % 05/03/2018 3.1  % Final   ??? Basophils % 05/03/2018 0.5  % Final   ??? Absolute Neutrophils 05/03/2018 1.9* 2.0 - 7.5 10*9/L Final   ??? Absolute Lymphocytes 05/03/2018 1.0* 1.5 - 5.0 10*9/L Final   ??? Absolute Monocytes 05/03/2018 0.2  0.2 - 0.8 10*9/L Final   ??? Absolute Eosinophils 05/03/2018 0.1  0.0 - 0.4 10*9/L Final   ??? Absolute Basophils 05/03/2018 0.0  0.0 - 0.1 10*9/L Final   ??? Large Unstained Cells 05/03/2018 4  0 - 4 % Final   ??? Macrocytosis 05/03/2018 Slight* Not Present Final   ??? Anisocytosis 05/03/2018 Moderate* Not Present Final

## 2018-05-03 NOTE — Unmapped (Signed)
Pt arrived. Port accessed with brisk blood return. Labs drawn. Plat count 23. Does not meet parameters for platelets. Parameters met for Granix injection. Given at 1030am in left abd. Port flushed and heparin locked. Deaccessed. AVS provided and pt discharged, ambulatory to lobby.

## 2018-05-06 ENCOUNTER — Ambulatory Visit: Admit: 2018-05-06 | Discharge: 2018-05-06 | Payer: MEDICARE

## 2018-05-06 DIAGNOSIS — C9110 Chronic lymphocytic leukemia of B-cell type not having achieved remission: Principal | ICD-10-CM

## 2018-05-06 LAB — CBC W/ AUTO DIFF
BASOPHILS ABSOLUTE COUNT: 0 10*9/L (ref 0.0–0.1)
BASOPHILS RELATIVE PERCENT: 0.6 %
EOSINOPHILS ABSOLUTE COUNT: 0.1 10*9/L (ref 0.0–0.4)
EOSINOPHILS RELATIVE PERCENT: 2.7 %
HEMATOCRIT: 36.1 % (ref 36.0–46.0)
HEMOGLOBIN: 12.1 g/dL — ABNORMAL LOW (ref 13.5–16.0)
LARGE UNSTAINED CELLS: 3 % (ref 0–4)
LYMPHOCYTES ABSOLUTE COUNT: 1.1 10*9/L — ABNORMAL LOW (ref 1.5–5.0)
LYMPHOCYTES RELATIVE PERCENT: 26.6 %
MEAN CORPUSCULAR HEMOGLOBIN CONC: 33.5 g/dL (ref 31.0–37.0)
MEAN CORPUSCULAR HEMOGLOBIN: 30.8 pg (ref 26.0–34.0)
MONOCYTES RELATIVE PERCENT: 5.7 %
NEUTROPHILS ABSOLUTE COUNT: 2.5 10*9/L (ref 2.0–7.5)
NEUTROPHILS RELATIVE PERCENT: 61.1 %
PLATELET COUNT: 16 10*9/L — ABNORMAL LOW (ref 150–440)
RED BLOOD CELL COUNT: 3.92 10*12/L — ABNORMAL LOW (ref 4.00–5.20)
WBC ADJUSTED: 4.2 10*9/L — ABNORMAL LOW (ref 4.5–11.0)

## 2018-05-06 LAB — PLATELET COUNT
Lab: 16 — ABNORMAL LOW
Lab: 18 — ABNORMAL LOW

## 2018-05-06 LAB — HEMOGLOBIN: Lab: 12.1 — ABNORMAL LOW

## 2018-05-07 ENCOUNTER — Ambulatory Visit: Admit: 2018-05-07 | Discharge: 2018-05-08 | Payer: MEDICARE | Attending: Pharmacist | Primary: Pharmacist

## 2018-05-07 ENCOUNTER — Ambulatory Visit: Admit: 2018-05-07 | Discharge: 2018-05-08 | Payer: MEDICARE | Attending: Otolaryngology | Primary: Otolaryngology

## 2018-05-07 ENCOUNTER — Other Ambulatory Visit: Admit: 2018-05-07 | Discharge: 2018-05-08 | Payer: MEDICARE

## 2018-05-07 DIAGNOSIS — D6959 Other secondary thrombocytopenia: Secondary | ICD-10-CM

## 2018-05-07 DIAGNOSIS — C9110 Chronic lymphocytic leukemia of B-cell type not having achieved remission: Secondary | ICD-10-CM

## 2018-05-07 DIAGNOSIS — T451X5A Adverse effect of antineoplastic and immunosuppressive drugs, initial encounter: Secondary | ICD-10-CM

## 2018-05-07 DIAGNOSIS — D701 Agranulocytosis secondary to cancer chemotherapy: Secondary | ICD-10-CM

## 2018-05-07 LAB — COMPREHENSIVE METABOLIC PANEL
ALBUMIN: 3.8 g/dL (ref 3.5–5.0)
ALKALINE PHOSPHATASE: 102 U/L (ref 38–126)
ALT (SGPT): 15 U/L (ref <35)
AST (SGOT): 22 U/L (ref 14–38)
BLOOD UREA NITROGEN: 14 mg/dL (ref 7–21)
BUN / CREAT RATIO: 18
CALCIUM: 9.4 mg/dL (ref 8.5–10.2)
CHLORIDE: 107 mmol/L (ref 98–107)
CO2: 22 mmol/L (ref 22.0–30.0)
CREATININE: 0.8 mg/dL (ref 0.60–1.00)
EGFR CKD-EPI AA FEMALE: 81 mL/min/{1.73_m2} (ref >=60)
EGFR CKD-EPI NON-AA FEMALE: 70 mL/min/{1.73_m2} (ref >=60)
GLUCOSE RANDOM: 94 mg/dL (ref 70–179)
POTASSIUM: 4.4 mmol/L (ref 3.5–5.0)
SODIUM: 140 mmol/L (ref 135–145)

## 2018-05-07 LAB — CBC W/ AUTO DIFF
BASOPHILS ABSOLUTE COUNT: 0 10*9/L (ref 0.0–0.1)
BASOPHILS RELATIVE PERCENT: 0.7 %
EOSINOPHILS ABSOLUTE COUNT: 0.1 10*9/L (ref 0.0–0.4)
EOSINOPHILS RELATIVE PERCENT: 3.4 %
HEMATOCRIT: 36.5 % (ref 36.0–46.0)
LARGE UNSTAINED CELLS: 4 % (ref 0–4)
LYMPHOCYTES ABSOLUTE COUNT: 1 10*9/L — ABNORMAL LOW (ref 1.5–5.0)
LYMPHOCYTES RELATIVE PERCENT: 27.6 %
MEAN CORPUSCULAR HEMOGLOBIN CONC: 33.6 g/dL (ref 31.0–37.0)
MEAN CORPUSCULAR HEMOGLOBIN: 30.8 pg (ref 26.0–34.0)
MEAN CORPUSCULAR VOLUME: 91.6 fL (ref 80.0–100.0)
MEAN PLATELET VOLUME: 10.5 fL — ABNORMAL HIGH (ref 7.0–10.0)
MONOCYTES ABSOLUTE COUNT: 0.2 10*9/L (ref 0.2–0.8)
MONOCYTES RELATIVE PERCENT: 6.4 %
NEUTROPHILS ABSOLUTE COUNT: 2 10*9/L (ref 2.0–7.5)
PLATELET COUNT: 24 10*9/L — ABNORMAL LOW (ref 150–440)
RED BLOOD CELL COUNT: 3.98 10*12/L — ABNORMAL LOW (ref 4.00–5.20)
WBC ADJUSTED: 3.5 10*9/L — ABNORMAL LOW (ref 4.5–11.0)

## 2018-05-07 LAB — NEUTROPHILS ABSOLUTE COUNT: Lab: 2

## 2018-05-07 LAB — URIC ACID: Urate:MCnc:Pt:Ser/Plas:Qn:: 6.1

## 2018-05-07 LAB — CO2: Carbon dioxide:SCnc:Pt:Ser/Plas:Qn:: 22

## 2018-05-07 LAB — PHOSPHORUS: Phosphate:MCnc:Pt:Ser/Plas:Qn:: 4.2

## 2018-05-07 NOTE — Unmapped (Signed)
VS taken. Labs drawn via [place].    Port flushed and brisk blood return noted.    Premeds and IV fluids given.    Pt received 1 unit of platelets for a plt ct of 16 - no adverse events noted.   Repeat pl ct 16. Another tube sent for verification. plt ct then 18. Dr. Lonni Fix notified.  MD said to not transfuse any more plt as [facility], at which time plt ct will be checked.  Pt made aware.   AVS given.  Pt stable and ambulated independently from clinic.  No concerns voiced.  VWilliams,RN.

## 2018-05-07 NOTE — Unmapped (Signed)
[facility] Follow Up       Patient Name: Katrina Gould  Patient Age: 78 y.o.  Encounter Date: 05/07/2018      Chief complaint/Reason for visit: CLL/SLL    Assessment:  Katrina Gould is a 78 y.o. female who presents for follow of her CLL/SLL, Rai stage 4. Disease markers: 46,XX,der(4)t(4;4)(p14;q21). IGHV unmutated (0%, 3-33*01). B36m 5.05. No TP53 mutation. High-risk by CLL-IPI (6 points).    Katrina Gould continues to do fairly well.  She has stable complaints today, unrelated to her CLL.  We are still waiting for manufacturer's assistance for the eltrombopag prescription.    Advised her to follow up ASAP with PCP for dizziness - this was prior to chemo, longstanding, and has not changed off of chemo.      Plans and Recommendations:  1. 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 Diarrhea  - resolved    3. Pain related to falls  - continue tylenol, ice, or heat as needed for pain relief    4. Subclinical hypothyroid  - Previous visit TSH elevated, T4 borderline low  - have PCP manage    5. Health Maintenance  - flu shot given this season  - continue annual dermatology follow up  - 1st Shingrix vaccine was given 06/2017; patient will need second vaccine, but recommend that she get this done at a pharmacy or at her PCP's office for insurance purposes    Due to this patient's diagnosis, she is at significant risk for subsequent morbidity and/or mortality.    Katrina Gould, AGPCNP-C, MSN, OCN  Nurse Practitioner  Hematologic Malignancies  [facility]  [phone] (phone)  [phone] (fax)  [email or web address]          Interval History:  Doing fair since being seen last.  Broken crown and a loose implant.  Would like to see Dental Clinic here.  Still with dizziness - hasn't seen PCP, has upcoming appointment.      Otherwise, she denies new constitutional symptoms such as anorexia, weight loss, drenching night sweats or unexplained fevers.  Furthermore, she denies symptoms of marrow failure:  recurrent or unexplained intercurrent infections.  There have been no new or unexplained pains or self-identified masses, swelling or enlarged lymph nodes.    Past Medical, Surgical and Family History were reviewed and pertinent updates were made in the Electronic Medical Record  ??  Review of Systems:  Other than as reported above in the interim history, the other systems reviewed were unremarkable.  ??  ECOG Performance Status: 1     Oncology History:    Oncology History    CLL    First seen in oncology clinic locally 03/31/16    Had a left axillary biopsy 03/15/16 that confirmed CLL/SLL: Flow showed monoclonal B-cell population with CD5, CD23, kappa light chain. IHC showed positive CD20 (diffuse), negative for cyclin D1.    No constitutional symptoms except some sweats    Labs: Hb 12.0, Hct 34.8, plt 147. WBC was 10.6 with ANC 2.5. ALC was 7.5K.    CLL FISH studies: normal panel. No cells with 11q, tri 12, 17p or 13q, and the 11;14 translocation also not detected.    Karyotype: 46,XX,der(4)t(4;4)(p14;q21)[3]/45,XX,dic(6;17)(q12;p11.2) (  no loss of TP53 on FISH)    Molecular: No TP53 mutation    CLL-IPI: As per Lancet Oncol Vol 15 October 2014  Includes (1) TP53 status (no abnormalities vs. 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 MRD = 0.32%.            CLL (chronic lymphocytic leukemia) (CMS-HCC)    05/16/2016 Initial Diagnosis     CLL (chronic lymphocytic leukemia) (RAF-HCC)      09/14/2017 - 10/05/2017 Chemotherapy     Rituximab weekly x4  No response      10/23/2017 -  Chemotherapy     Began ven/obi      03/19/2018 Adverse Reaction     Obinutuzumab held given persistent thrombocytopenia. Marrow performed on 03/22/18, patient with no overt CLL (0.32% by MRD flow testing from [facility]). Do not plan to give 6th cycle of obi (unless counts recover in a reasonable timeframe).      04/09/2018 Adverse Reaction     ANC down to 0.2. Held venetoclax (was previously at 200 mg daily).        B-cell chronic lymphocytic leukemia (CMS-HCC)    09/05/2017 - 10/11/2017 Chemotherapy     Chemotherapy Treatment    Treatment Goal Control   Line of Treatment [No plan line of treatment]   Plan Name OP RITUXIMAB    Start Date 09/14/2017   End Date 10/05/2017   Provider Pernell Dupre, MD   Chemotherapy riTUXimab (RITUXAN) 712.5 mg in sodium chloride (NS) 0.9 % 500 mL IVPB, 375 mg/m2 = 712.5 mg, Intravenous, Once, 1 of 1 cycle  Administration: 712.5 mg (09/14/2017)  riTUXimab (RITUXAN) 712.5 mg in sodium chloride (NS) 0.9 % 250 mL rapid infusion, 375 mg/m2 = 712.5 mg, Intravenous, Once, 1 of 1 cycle  Administration: 712.5 mg (09/21/2017), 712.5 mg (09/28/2017), 712.5 mg (10/05/2017)         10/16/2017 Initial Diagnosis     Chronic lymphocytic leukemia, Rai stage IV (CMS-HCC)      10/23/2017 -  Chemotherapy     OP OBINUTUZUMAB AND VENETOCLAX  chlorambucil 0.5 mg/kg PO on days 1, 15, obinutuzumab 100 mg IV on day 1, obinutuzumab 900 mg IV on day 2, obinutuzumab 1,000 mg IV on days 8,15 on Cycle 1, then obinutuzumab 1,000 mg IV on day 1 for sequent cycles, every 28 days.         Allergies   Allergen Reactions   ??? Baclofen      made me go crazy         Current Outpatient Medications   Medication Sig Dispense Refill   ??? allopurinol (ZYLOPRIM) 300 MG  tablet Take 1 tablet (300 mg total) by mouth daily. 30 tablet 2   ??? cholestyramine (QUESTRAN) 4 gram packet Take 1 packet by mouth Three (3) times a day with a meal. 90 each 12   ??? eltrombopag (PROMACTA) 25 MG tablet Take 2 tablets (50 mg total) by mouth daily. Administer on an empty stomach, 1 hour before or 2 hours after a meal. 60 tablet 2   ??? fesoterodine (TOVIAZ) 4 mg 24 hr tablet Take by mouth Take as directed.      ??? fluticasone propion-salmeterol (ADVAIR) 100-50 mcg/dose diskus Inhale.     ??? loperamide (IMODIUM A-D) 2 mg tablet Take 2 mg by mouth Three (3) times a day as needed for diarrhea.     ??? LORazepam (ATIVAN) 0.5 MG tablet Take 0.5 mg by mouth two (2) times a day as needed.      ??? mirtazapine (REMERON) 7.5 MG tablet Take 7.5 mg by mouth nightly.     ??? multivitamin (TAB-A-VITE/THERAGRAN) per tablet Take 1 tablet by mouth daily.      ??? oxyCODONE (ROXICODONE) 5 MG immediate release tablet Take 1 tablet (5 mg total) by mouth every eight (8) hours as needed for pain. 20 tablet 0   ??? pantoprazole (PROTONIX) 40 MG tablet Take by mouth daily at 0600.      ??? potassium chloride (KLOR-CON) 10 MEQ CR tablet Take 2 tablets (20 mEq total) by mouth daily for 7 days. 14 tablet 0   ??? pravastatin (PRAVACHOL) 80 MG tablet TAKE 1 TABLET BY MOUTH EVERY NIGHT AT BEDTIME     ??? predniSONE (DELTASONE) 10 MG tablet 6 TABS X 1 DAY, 5 TABS X 1 DAY, 4 TABS X 1 DAY, ETC...  0   ??? sertraline (ZOLOFT) 50 MG tablet Take 50 mg by mouth daily.  1   ??? topiramate (TOPAMAX) 50 MG tablet Take 50 mg by mouth daily.  3   ??? triamcinolone (KENALOG) 0.1 % cream Apply topically Two (2) times a day. 30 g 1   ??? venetoclax (VENCLEXTA) 100 mg tablet Take 2 tablets (200 mg total) by mouth daily. (Patient not taking: Reported on 04/30/2018) 60 each 6     No current facility-administered medications for this visit.      Physical exam:  Vitals:    05/07/18 1103   BP: 141/69   Pulse: 71   Resp: 18   Temp: 35.9 ??C (96.6 ??F)   SpO2: 98%     Constitutional: Resting, in no apparent distress  Eyes:  No scleral icterus or conjunctival injection.  Ear/nose/mouth/throat: Oral mucosa without ulceration, erythema or exudate.   Hematology/lymphatic/immunologic:  No lymphadenopathy in the anterior/posterior cervical, supraclavicular basins.  Cardiovascular:  RRR.  S1, S2.  No murmurs, gallops or rubs. Appear well-perfused.  No clubbing or cyanosis. 1+ edema to bilateral ankles and feet.  Respiratory:  Breathing is unlabored, and patient is speaking full sentences with ease.  No stridor.  CTAB. No rales, ronchi or crackles.     GI:  No distention. Bowel sounds are present and normal in quality.  Mild pain on palpation of right upper quadrant. No palpable hepatomegaly or splenomegaly.  No palpable masses.   Musculoskeletal: . No pain on palpation of the spinous processes of the cervical, thoracic or lumbar vertebral bodies. No grossly-evident joint effusions or deformities.  Range of motion about the shoulder, elbow, hips and knees is grossly normal.    Skin:  No rashes or areas of skin breakdown.  Warm to touch, dry, smooth and even.  Neurologic:  Gait is normal.  Cerebellar tasks are completed with ease and are symmetric.  Psychiatric:  Alert and oriented to person, place, time and situation.  Range of affect is appropriate.      ECOG Performance Status: 1    Results:  Labs and pathology have been reviewed and pertinent results are as follows:    Results for orders placed or performed in visit on 05/07/18   Comprehensive Metabolic Panel   Result Value Ref Range    Sodium 140 135 - 145 mmol/L    Potassium 4.4 3.5 - 5.0 mmol/L    Chloride 107 98 - 107 mmol/L    CO2 22.0 22.0 - 30.0 mmol/L    BUN 14 7 - 21 mg/dL    Creatinine 2.95 2.84 - 1.00 mg/dL    BUN/Creatinine Ratio 18     EGFR CKD-EPI Non-African American, Female 70 >=60 mL/min/1.88m2    EGFR CKD-EPI African American, Female 77 >=60 mL/min/1.62m2    Glucose 94 70 - 179 mg/dL    Calcium 9.4 8.5 - 13.2 mg/dL    Albumin 3.8 3.5 - 5.0 g/dL    Total Protein 6.4 (L) 6.5 - 8.3 g/dL    Total Bilirubin 0.5 0.0 - 1.2 mg/dL    AST 22 14 - 38 U/L    ALT 15 <35 U/L    Alkaline Phosphatase 102 38 - 126 U/L    Anion Gap 11 7 - 15 mmol/L   Uric acid   Result Value Ref Range    Uric Acid 6.1 3.0 - 6.5 mg/dL   Phosphorus Level   Result Value Ref Range    Phosphorus 4.2 2.9 - 4.7 mg/dL   CBC w/ Differential   Result Value Ref Range    WBC 3.5 (L) 4.5 - 11.0 10*9/L    RBC 3.98 (L) 4.00 - 5.20 10*12/L    HGB 12.3 12.0 - 16.0 g/dL    HCT 44.0 10.2 - 72.5 %    MCV 91.6 80.0 - 100.0 fL    MCH 30.8 26.0 - 34.0 pg    MCHC 33.6 31.0 - 37.0 g/dL    RDW 36.6 (H) 44.0 - 15.0 %    MPV 10.5 (H) 7.0 - 10.0 fL    Platelet 24 (L) 150 - 440 10*9/L    Neutrophils % 58.3 %    Lymphocytes % 27.6 %    Monocytes % 6.4 %    Eosinophils % 3.4 %    Basophils % 0.7 %    Absolute Neutrophils 2.0 2.0 - 7.5 10*9/L    Absolute Lymphocytes 1.0 (L) 1.5 - 5.0 10*9/L    Absolute Monocytes 0.2 0.2 - 0.8 10*9/L    Absolute Eosinophils 0.1 0.0 - 0.4 10*9/L    Absolute Basophils 0.0 0.0 - 0.1 10*9/L    Large Unstained Cells 4 0 - 4 %    Macrocytosis Slight (A) Not Present    Anisocytosis Moderate (A) Not Present    Hypochromasia Slight (A) Not Present

## 2018-05-07 NOTE — Unmapped (Signed)
Patient labs drawn and sent for analysis. Care per A Caralee Ates

## 2018-05-08 NOTE — Unmapped (Signed)
Katrina Gould is a 78 y.o. female with CLL who I am seeing in clinic today for medication management     Encounter Date: 05/07/2018    Current Treatment: venetoclax - on HOLD due to cytopenias (stoped ~5 weeks ago)    For oral chemotherapy:  Pharmacy: [facility] - venetoclax (on hold); [facility] - promacta (has not received yet)  Confirmed Address and Phone Number: Yes   Medication Access: ven - $0/month with grant; promacta - $0/month with MFA    Interval History: Katrina Gould denies complications while being off venetoclax. Her diarrhea has resolved that she used to have on the medication. She does report some intermittent backpain caused from doing activity like cooking that she take prn oxy infrequently for. She notes a chipped crown that she wanted to see our dental clinic for. Her CBC is notable for platelets at 24 (transfused yesterday 1/6). Other labs stable or WNL. She has not yet heard from the pharmacy regarding shipment of her promacta, although the assistance has been approved.       Oncologic History:  Oncology History    CLL    First seen in oncology clinic locally 03/31/16    Had a left axillary biopsy 03/15/16 that confirmed CLL/SLL: Flow showed monoclonal B-cell population with CD5, CD23, kappa light chain. IHC showed positive CD20 (diffuse), negative for cyclin D1.    No constitutional symptoms except some sweats    Labs: Hb 12.0, Hct 34.8, plt 147. WBC was 10.6 with ANC 2.5. ALC was 7.5K.    CLL FISH studies: normal panel. No cells with 11q, tri 12, 17p or 13q, and the 11;14 translocation also not detected.    Karyotype: 46,XX,der(4)t(4;4)(p14;q21)[3]/45,XX,dic(6;17)(q12;p11.2) (no loss of TP53 on FISH)    Molecular: No TP53 mutation    CLL-IPI: As per Lancet Oncol Vol 15 October 2014  Includes (1) TP53 status (no abnormalities vs. 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 MRD = 0.32%.            CLL (chronic lymphocytic leukemia) (CMS-HCC)    05/16/2016 Initial Diagnosis     CLL (chronic lymphocytic leukemia) (RAF-HCC)      09/14/2017 - 10/05/2017 Chemotherapy     Rituximab weekly x4  No response      10/23/2017 -  Chemotherapy     Began ven/obi      03/19/2018 Adverse Reaction     Obinutuzumab held given persistent thrombocytopenia. Marrow performed on 03/22/18, patient with no overt CLL (0.32% by MRD flow testing from [facility]). Do not plan to give 6th cycle of obi (unless counts recover in a reasonable timeframe).  04/09/2018 Adverse Reaction     ANC down to 0.2. Held venetoclax (was previously at 200 mg daily).        B-cell chronic lymphocytic leukemia (CMS-HCC)    09/05/2017 - 10/11/2017 Chemotherapy     Chemotherapy Treatment    Treatment Goal Control   Line of Treatment [No plan line of treatment]   Plan Name OP RITUXIMAB    Start Date 09/14/2017   End Date 10/05/2017   Provider Pernell Dupre, MD   Chemotherapy riTUXimab (RITUXAN) 712.5 mg in sodium chloride (NS) 0.9 % 500 mL IVPB, 375 mg/m2 = 712.5 mg, Intravenous, Once, 1 of 1 cycle  Administration: 712.5 mg (09/14/2017)  riTUXimab (RITUXAN) 712.5 mg in sodium chloride (NS) 0.9 % 250 mL rapid infusion, 375 mg/m2 = 712.5 mg, Intravenous, Once, 1 of 1 cycle  Administration: 712.5 mg (09/21/2017), 712.5 mg (09/28/2017), 712.5 mg (10/05/2017) 10/16/2017 Initial Diagnosis     Chronic lymphocytic leukemia, Rai stage IV (CMS-HCC)      10/23/2017 -  Chemotherapy     OP OBINUTUZUMAB AND VENETOCLAX  chlorambucil 0.5 mg/kg PO on days 1, 15, obinutuzumab 100 mg IV on day 1, obinutuzumab 900 mg IV on day 2, obinutuzumab 1,000 mg IV on days 8,15 on Cycle 1, then obinutuzumab 1,000 mg IV on day 1 for sequent cycles, every 28 days.         Weight and Vitals:  Wt Readings from Last 3 Encounters:   05/07/18 79.7 kg (175 lb 12.8 oz)   05/06/18 79.1 kg (174 lb 4.4 oz)   05/03/18 81.6 kg (180 lb)     Temp Readings from Last 3 Encounters:   05/07/18 35.9 ??C (96.6 ??F) (Tympanic)   05/06/18 36.5 ??C (97.7 ??F) (Oral)   05/03/18 36.6 ??C (97.9 ??F) (Oral)     BP Readings from Last 3 Encounters:   05/07/18 141/69   05/06/18 122/62   05/03/18 155/71     Pulse Readings from Last 3 Encounters:   05/07/18 71   05/06/18 72   05/03/18 76       Pertinent Labs:  Lab on 05/07/2018   Component Date Value Ref Range Status   ??? Sodium 05/07/2018 140  135 - 145 mmol/L Final   ??? Potassium 05/07/2018 4.4  3.5 - 5.0 mmol/L Final   ??? Chloride 05/07/2018 107  98 - 107 mmol/L Final   ??? CO2 05/07/2018 22.0  22.0 - 30.0 mmol/L Final   ??? BUN 05/07/2018 14  7 - 21 mg/dL Final   ??? Creatinine 05/07/2018 0.80  0.60 - 1.00 mg/dL Final   ??? BUN/Creatinine Ratio 05/07/2018 18   Final   ??? EGFR CKD-EPI Non-African American,* 05/07/2018 70  >=60 mL/min/1.35m2 Final   ??? EGFR CKD-EPI African American, Fem* 05/07/2018 81  >=60 mL/min/1.25m2 Final   ??? Glucose 05/07/2018 94  70 - 179 mg/dL Final   ??? Calcium 81/19/1478 9.4  8.5 - 10.2 mg/dL Final   ??? Albumin 29/56/2130 3.8  3.5 - 5.0 g/dL Final   ??? Total Protein 05/07/2018 6.4* 6.5 - 8.3 g/dL Final   ??? Total Bilirubin 05/07/2018 0.5  0.0 - 1.2 mg/dL Final   ??? AST 86/57/8469 22  14 - 38 U/L Final   ??? ALT 05/07/2018 15  <35 U/L Final   ??? Alkaline Phosphatase 05/07/2018 102  38 - 126 U/L Final   ??? Anion Gap 05/07/2018 11  7 - 15 mmol/L Final   ??? Uric Acid 05/07/2018 6.1 3.0 - 6.5  mg/dL Final   ??? Phosphorus 05/07/2018 4.2  2.9 - 4.7 mg/dL Final   ??? WBC 98/03/9146 3.5* 4.5 - 11.0 10*9/L Final   ??? RBC 05/07/2018 3.98* 4.00 - 5.20 10*12/L Final   ??? HGB 05/07/2018 12.3  12.0 - 16.0 g/dL Final   ??? HCT 82/95/6213 36.5  36.0 - 46.0 % Final   ??? MCV 05/07/2018 91.6  80.0 - 100.0 fL Final   ??? MCH 05/07/2018 30.8  26.0 - 34.0 pg Final   ??? MCHC 05/07/2018 33.6  31.0 - 37.0 g/dL Final   ??? RDW 08/65/7846 18.4* 12.0 - 15.0 % Final   ??? MPV 05/07/2018 10.5* 7.0 - 10.0 fL Final   ??? Platelet 05/07/2018 24* 150 - 440 10*9/L Final   ??? Neutrophils % 05/07/2018 58.3  % Final   ??? Lymphocytes % 05/07/2018 27.6  % Final   ??? Monocytes % 05/07/2018 6.4  % Final   ??? Eosinophils % 05/07/2018 3.4  % Final   ??? Basophils % 05/07/2018 0.7  % Final   ??? Absolute Neutrophils 05/07/2018 2.0  2.0 - 7.5 10*9/L Final   ??? Absolute Lymphocytes 05/07/2018 1.0* 1.5 - 5.0 10*9/L Final   ??? Absolute Monocytes 05/07/2018 0.2  0.2 - 0.8 10*9/L Final   ??? Absolute Eosinophils 05/07/2018 0.1  0.0 - 0.4 10*9/L Final   ??? Absolute Basophils 05/07/2018 0.0  0.0 - 0.1 10*9/L Final   ??? Large Unstained Cells 05/07/2018 4  0 - 4 % Final   ??? Macrocytosis 05/07/2018 Slight* Not Present Final   ??? Anisocytosis 05/07/2018 Moderate* Not Present Final   ??? Hypochromasia 05/07/2018 Slight* Not Present Final       Allergies:   Allergies   Allergen Reactions   ??? Baclofen      made me go crazy       Drug Interactions: none      Current Medications:  Current Outpatient Medications   Medication Sig Dispense Refill   ??? fluticasone propion-salmeterol (ADVAIR) 100-50 mcg/dose diskus Inhale 2 puffs daily.      ??? loperamide (IMODIUM A-D) 2 mg tablet Take 2 mg by mouth Three (3) times a day as needed for diarrhea.     ??? LORazepam (ATIVAN) 0.5 MG tablet Take 0.5 mg by mouth two (2) times a day as needed.      ??? mirtazapine (REMERON) 7.5 MG tablet Take 7.5 mg by mouth nightly.     ??? multivitamin (TAB-A-VITE/THERAGRAN) per tablet Take 1 tablet by mouth daily. ??? oxyCODONE (ROXICODONE) 5 MG immediate release tablet Take 1 tablet (5 mg total) by mouth every eight (8) hours as needed for pain. 20 tablet 0   ??? pantoprazole (PROTONIX) 40 MG tablet Take by mouth daily.      ??? pravastatin (PRAVACHOL) 80 MG tablet TAKE 1 TABLET BY MOUTH EVERY NIGHT AT BEDTIME     ??? sertraline (ZOLOFT) 50 MG tablet Take 50 mg by mouth daily.  1   ??? topiramate (TOPAMAX) 50 MG tablet Take 50 mg by mouth daily.  3   ??? eltrombopag (PROMACTA) 25 MG tablet Take 2 tablets (50 mg total) by mouth daily. Administer on an empty stomach, 1 hour before or 2 hours after a meal. 60 tablet 2     No current facility-administered medications for this visit.        Adherence: appropriately holding oral chemo      Assessment: Katrina Gould is a 78 y.o. female with CLL for which venetoclax therapy has been  held due to cytopenias    Plan:   - Continue to hold venetoclax therapy due to thrombocytopenia  - Eltrombopag- I called [facility] pharmacy with the patient with the intention of setting up delivery [phone]). They are still adjudicating the claim but the pharmacist I spoke with was able to put her at the top of the list. We confirmed address and phone number on the phone for Katrina Gould. She should be getting a call from them within a couple days to set up delivery. She should start immediately  - I reviewed some points regarding eltrombopag including administering 2 tablets (50 mg) daily on an empty stomach. We will monitor labs for PLT improvement as well as Scr/LFTs.   - Med rec completed - removed several medications she's not taking including allopurinol and venetoclax. When she is ready to restart, she should restart both of these and new prescriptions should be sent  - May need further workup with BMbx to determine if therapy needs to be reinitiated or if there is concern for other underlying problem causing her cytopenias    F/u:  Future Appointments   Date Time Provider Department Center   05/10/2018 8:00 AM [facility] LAB [facility] [address] [facility]   05/10/2018  9:00 AM ONCINF [facility] CHAIR 04 [facility]   05/15/2018 10:30 AM ADULT ONC LAB [facility]   05/15/2018 11:30 AM Melissa Lynford Citizen, NP [id number]   05/15/2018 12:30 PM Bernerd Limbo, FNP [id number]   05/15/2018  1:30 PM ONCINF CHAIR 40 [id number]   05/28/2018  8:30 AM ADULT ONC LAB [facility]   05/28/2018  9:30 AM Pernell Dupre, MD [id number]   05/28/2018 10:30 AM ONCINF CHAIR 09 [id number]       I spent 30 minutes with Katrina Gould in direct patient care.      Manfred Arch, PharmD, BCOP, CPP  Pager: [phone]

## 2018-05-10 ENCOUNTER — Ambulatory Visit: Admit: 2018-05-10 | Discharge: 2018-05-10 | Payer: MEDICARE

## 2018-05-10 DIAGNOSIS — C9110 Chronic lymphocytic leukemia of B-cell type not having achieved remission: Secondary | ICD-10-CM

## 2018-05-10 LAB — CBC W/ AUTO DIFF
BASOPHILS ABSOLUTE COUNT: 0 10*9/L (ref 0.0–0.1)
BASOPHILS RELATIVE PERCENT: 0.7 %
EOSINOPHILS ABSOLUTE COUNT: 0.1 10*9/L (ref 0.0–0.4)
HEMATOCRIT: 35.9 % — ABNORMAL LOW (ref 36.0–46.0)
HEMOGLOBIN: 12.1 g/dL — ABNORMAL LOW (ref 13.5–16.0)
LARGE UNSTAINED CELLS: 3 % (ref 0–4)
LYMPHOCYTES ABSOLUTE COUNT: 1.2 10*9/L — ABNORMAL LOW (ref 1.5–5.0)
LYMPHOCYTES RELATIVE PERCENT: 34 %
MEAN CORPUSCULAR HEMOGLOBIN CONC: 33.6 g/dL (ref 31.0–37.0)
MEAN CORPUSCULAR HEMOGLOBIN: 30.2 pg (ref 26.0–34.0)
MEAN CORPUSCULAR VOLUME: 89.9 fL (ref 80.0–100.0)
MEAN PLATELET VOLUME: 9.7 fL (ref 7.0–10.0)
MONOCYTES ABSOLUTE COUNT: 0.2 10*9/L (ref 0.2–0.8)
MONOCYTES RELATIVE PERCENT: 6.6 %
NEUTROPHILS ABSOLUTE COUNT: 1.9 10*9/L — ABNORMAL LOW (ref 2.0–7.5)
NEUTROPHILS RELATIVE PERCENT: 52.6 %
RED BLOOD CELL COUNT: 3.99 10*12/L — ABNORMAL LOW (ref 4.00–5.20)
RED CELL DISTRIBUTION WIDTH: 17.9 % — ABNORMAL HIGH (ref 12.0–15.0)
WBC ADJUSTED: 3.5 10*9/L — ABNORMAL LOW (ref 4.5–11.0)

## 2018-05-10 LAB — MEAN PLATELET VOLUME: Lab: 9.7

## 2018-05-10 NOTE — Unmapped (Signed)
Infusion on 05/10/2018   Component Date Value Ref Range Status   ??? Antibody Screen 05/10/2018 NEG   Final   ??? Blood Type 05/10/2018 B NEG   Final   ??? WBC 05/10/2018 3.5* 4.5 - 11.0 10*9/L Final   ??? RBC 05/10/2018 3.99* 4.00 - 5.20 10*12/L Final   ??? HGB 05/10/2018 12.1* 13.5 - 16.0 g/dL Final   ??? HCT 16/01/9603 35.9* 36.0 - 46.0 % Final   ??? MCV 05/10/2018 89.9  80.0 - 100.0 fL Final   ??? MCH 05/10/2018 30.2  26.0 - 34.0 pg Final   ??? MCHC 05/10/2018 33.6  31.0 - 37.0 g/dL Final   ??? RDW 54/12/8117 17.9* 12.0 - 15.0 % Final   ??? MPV 05/10/2018 9.7  7.0 - 10.0 fL Final   ??? Platelet 05/10/2018 29* 150 - 440 10*9/L Final   ??? Neutrophils % 05/10/2018 52.6  % Final   ??? Lymphocytes % 05/10/2018 34.0  % Final   ??? Monocytes % 05/10/2018 6.6  % Final   ??? Eosinophils % 05/10/2018 3.0  % Final   ??? Basophils % 05/10/2018 0.7  % Final   ??? Absolute Neutrophils 05/10/2018 1.9* 2.0 - 7.5 10*9/L Final   ??? Absolute Lymphocytes 05/10/2018 1.2* 1.5 - 5.0 10*9/L Final   ??? Absolute Monocytes 05/10/2018 0.2  0.2 - 0.8 10*9/L Final   ??? Absolute Eosinophils 05/10/2018 0.1  0.0 - 0.4 10*9/L Final   ??? Absolute Basophils 05/10/2018 0.0  0.0 - 0.1 10*9/L Final   ??? Large Unstained Cells 05/10/2018 3  0 - 4 % Final   ??? Anisocytosis 05/10/2018 Slight* Not Present Final       If you feel like this is an emergency please call 911.  For appointments or questions Monday through Friday 8AM-5PM please call [phone] or Toll Free [phone]. For Medical questions or concerns ask for the Nurse Triage Line.  On Nights, Weekends, and Holidays call [phone] and ask for the Oncologist on Call.  Reasons to call the Nurse Triage Line:  Fever of 100.5 or greater  Nausea and/or vomiting not relived with nausea medicine  Diarrhea or constipation  Severe pain not relieved with usual pain regimen  Shortness of breath  Uncontrolled bleeding  Mental status changes

## 2018-05-10 NOTE — Unmapped (Signed)
[name] platelets 29,000 today, does not meet parameters for transfusion. Port flushed, hep locked and deaccessed. AVS given, discharged ambulatory in no acute distress.

## 2018-05-13 NOTE — Unmapped (Signed)
[facility] Follow Up       Patient Name: Katrina Gould  Patient Age: 78 y.o.  Encounter Date: 05/15/2018      Chief complaint/Reason for visit: CLL/SLL    Assessment:  Katrina Gould is a 78 y.o. female who presents for follow of her CLL/SLL, Rai stage 4. Disease markers: 46,XX,der(4)t(4;4)(p14;q21). IGHV unmutated (0%, 3-33*01). B38m 5.05. No TP53 mutation. High-risk by CLL-IPI (6 points).    [name] continues to do fairly well.  Her main complaint today is her right shoulder pain, though she has not been seen recently by ortho.  I reviewed that she needs to call them today to make an appointment to be seen to better manage this - our team will not prescribe pain medications should she require them.    She started eltrombopag last Thursday - platelets today are 23k.  She is somewhat frustrated with this, though I did tell her that it could be 1-2 weeks until we start seeing results.  I verified with her that she is taking it first thing in the morning on an empty stomach.      Possibly recovering ANC - hasn't needed granix since 1/3 and ANC is 1.7.      Plans and Recommendations:  1. CLL, Rai 4   - Dose reduce Venetoclax to 100 mg when platelets recover  - Continue eltrombopag 50mg  PO daily  - if no improvement in 2 weeks, increase to 75mg  daily  - continue Granix for ANC <1.0 (plan updated to reflect PRN)  - Continue twice weekly labs  - BMBx today  - Follow up in 2 weeks    2. Diarrhea  - resolved    3. Pain related to falls  - continue tylenol, ice, or heat as needed for pain relief    4. Subclinical hypothyroid  - Previous visit TSH elevated, T4 borderline low  - have PCP manage    5. Health Maintenance  - flu shot given this season  - continue annual dermatology follow up  - 1st Shingrix vaccine was given 06/2017; patient will need second vaccine, but recommend that she get this done at a pharmacy or at her PCP's office for insurance purposes    Due to this patient's diagnosis, she is at significant risk for subsequent morbidity and/or mortality.    Markus Jarvis, AGPCNP-C, MSN, OCN  Nurse Practitioner  Hematologic Malignancies  [facility]  [phone] (phone)  [phone] (fax)  [email or web address]          Interval History:  Doing fair since being seen last.  Still having shoulder pain related to arthritis.  Has gotten a cortisone shot.    Hasn't seen ortho recently.  No b symptoms.    Otherwise, she denies new constitutional symptoms such as anorexia, weight loss, drenching night sweats or unexplained fevers.  Furthermore, she denies symptoms of marrow failure:  recurrent or unexplained intercurrent infections.  There have been no new or unexplained pains or self-identified masses, swelling or enlarged lymph nodes.    Past Medical, Surgical and Family History were reviewed and pertinent updates were made in the Electronic Medical Record  ??  Review of Systems:  Other than as reported above in the interim history, the other systems reviewed were unremarkable.  ??  ECOG Performance Status: 1     Oncology History:    Oncology History    CLL    First seen in oncology clinic locally 03/31/16    Had a  left axillary biopsy 03/15/16 that confirmed CLL/SLL: Flow showed monoclonal B-cell population with CD5, CD23, kappa light chain. IHC showed positive CD20 (diffuse), negative for cyclin D1.    No constitutional symptoms except some sweats    Labs: Hb 12.0, Hct 34.8, plt 147. WBC was 10.6 with ANC 2.5. ALC was 7.5K.    CLL FISH studies: normal panel. No cells with 11q, tri 12, 17p or 13q, and the 11;14 translocation also not detected.    Karyotype: 46,XX,der(4)t(4;4)(p14;q21)[3]/45,XX,dic(6;17)(q12;p11.2) (no loss of TP53 on FISH)    Molecular: No TP53 mutation    CLL-IPI: As per Lancet Oncol Vol 15 October 2014  Includes (1) TP53 status (no abnormalities vs. 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 MRD = 0.32%.            CLL (chronic lymphocytic leukemia) (CMS-HCC)    05/16/2016 Initial Diagnosis     CLL (chronic lymphocytic leukemia) (RAF-HCC)      09/14/2017 - 10/05/2017 Chemotherapy     Rituximab weekly x4  No response      10/23/2017 -  Chemotherapy     Began ven/obi      03/19/2018 Adverse Reaction     Obinutuzumab held given persistent thrombocytopenia. Marrow performed on 03/22/18, patient with no overt CLL (0.32% by MRD flow testing from [facility]). Do not plan to give 6th cycle of obi (unless counts recover in a reasonable timeframe).      04/09/2018 Adverse Reaction     ANC down to 0.2. Held venetoclax (was previously at 200 mg daily).        B-cell chronic lymphocytic leukemia (CMS-HCC)    09/05/2017 - 10/11/2017 Chemotherapy     Chemotherapy Treatment    Treatment Goal Control   Line of Treatment [No plan line of treatment]   Plan Name OP RITUXIMAB    Start Date 09/14/2017   End Date 10/05/2017   Provider Pernell Dupre, MD   Chemotherapy riTUXimab (RITUXAN) 712.5 mg in sodium chloride (NS) 0.9 % 500 mL IVPB, 375 mg/m2 = 712.5 mg, Intravenous, Once, 1 of 1 cycle  Administration: 712.5 mg (09/14/2017)  riTUXimab (RITUXAN) 712.5 mg in sodium chloride (NS) 0.9 % 250 mL rapid infusion, 375 mg/m2 = 712.5 mg, Intravenous, Once, 1 of 1 cycle  Administration: 712.5 mg (09/21/2017), 712.5 mg (09/28/2017), 712.5 mg (10/05/2017)         10/16/2017 Initial Diagnosis     Chronic lymphocytic leukemia, Rai stage IV (CMS-HCC)      10/23/2017 -  Chemotherapy     OP OBINUTUZUMAB AND VENETOCLAX  chlorambucil 0.5 mg/kg PO on days 1, 15, obinutuzumab 100 mg IV  on day 1, obinutuzumab 900 mg IV on day 2, obinutuzumab 1,000 mg IV on days 8,15 on Cycle 1, then obinutuzumab 1,000 mg IV on day 1 for sequent cycles, every 28 days.         Allergies   Allergen Reactions   ??? Baclofen      made me go crazy         Current Outpatient Medications   Medication Sig Dispense Refill   ??? eltrombopag (PROMACTA) 25 MG tablet Take 2 tablets (50 mg total) by mouth daily. Administer on an empty stomach, 1 hour before or 2 hours after a meal. 60 tablet 2   ??? fluticasone propion-salmeterol (ADVAIR) 100-50 mcg/dose diskus Inhale 2 puffs daily.      ??? loperamide (IMODIUM A-D) 2 mg tablet Take 2 mg by mouth Three (3) times a day as needed for diarrhea.     ??? LORazepam (ATIVAN) 0.5 MG tablet Take 0.5 mg by mouth two (2) times a day as needed.      ??? mirtazapine (REMERON) 7.5 MG tablet Take 7.5 mg by mouth nightly.     ??? multivitamin (TAB-A-VITE/THERAGRAN) per tablet Take 1 tablet by mouth daily.      ??? oxyCODONE (ROXICODONE) 5 MG immediate release tablet Take 1 tablet (5 mg total) by mouth every eight (8) hours as needed for pain. 20 tablet 0   ??? pantoprazole (PROTONIX) 40 MG tablet Take by mouth daily.      ??? pravastatin (PRAVACHOL) 80 MG tablet TAKE 1 TABLET BY MOUTH EVERY NIGHT AT BEDTIME     ??? sertraline (ZOLOFT) 50 MG tablet Take 50 mg by mouth daily.  1   ??? topiramate (TOPAMAX) 50 MG tablet Take 50 mg by mouth daily.  3     No current facility-administered medications for this visit.      Physical exam:  There were no vitals filed for this visit.  Constitutional: Resting, in no apparent distress  Eyes:  No scleral icterus or conjunctival injection.  Ear/nose/mouth/throat: Oral mucosa without ulceration, erythema or exudate.   Hematology/lymphatic/immunologic: No lymphadenopathy in the anterior/posterior cervical, supraclavicular basins.  Cardiovascular:  RRR.  S1, S2.  No murmurs, gallops or rubs. Appear well-perfused.  No clubbing or cyanosis. 1+ edema to bilateral ankles and feet.  Respiratory:  Breathing is unlabored, and patient is speaking full sentences with ease.  No stridor.  CTAB. No rales, ronchi or crackles.     GI:  No distention. Bowel sounds are present and normal in quality.  Mild pain on palpation of right upper quadrant. No palpable hepatomegaly or splenomegaly.  No palpable masses.   Musculoskeletal: . No pain on palpation of the spinous processes of the cervical, thoracic or lumbar vertebral bodies. No grossly-evident joint effusions or deformities.  Range of motion about the shoulder, elbow, hips and knees is grossly normal.    Skin:  No rashes or areas of skin breakdown.  Warm to touch, dry, smooth and even.  Neurologic:  Gait is normal.  Cerebellar tasks are completed with ease and are symmetric.  Psychiatric:  Alert and oriented to person, place, time and situation.  Range of affect is appropriate.      ECOG Performance Status: 1    Results:  Labs and pathology have been reviewed and pertinent results are as follows:    Results for orders placed or performed in visit on 05/10/18   Type and Screen   Result Value Ref Range    Antibody Screen NEG  Blood Type B NEG    CBC w/ Differential   Result Value Ref Range    WBC 3.5 (L) 4.5 - 11.0 10*9/L    RBC 3.99 (L) 4.00 - 5.20 10*12/L    HGB 12.1 (L) 13.5 - 16.0 g/dL    HCT 09.8 (L) 11.9 - 46.0 %    MCV 89.9 80.0 - 100.0 fL    MCH 30.2 26.0 - 34.0 pg    MCHC 33.6 31.0 - 37.0 g/dL    RDW 14.7 (H) 82.9 - 15.0 %    MPV 9.7 7.0 - 10.0 fL    Platelet 29 (L) 150 - 440 10*9/L    Neutrophils % 52.6 %    Lymphocytes % 34.0 %    Monocytes % 6.6 %    Eosinophils % 3.0 %    Basophils % 0.7 %    Absolute Neutrophils 1.9 (L) 2.0 - 7.5 10*9/L    Absolute Lymphocytes 1.2 (L) 1.5 - 5.0 10*9/L    Absolute Monocytes 0.2 0.2 - 0.8 10*9/L    Absolute Eosinophils 0.1 0.0 - 0.4 10*9/L    Absolute Basophils 0.0 0.0 - 0.1 10*9/L    Large Unstained Cells 3 0 - 4 %    Anisocytosis Slight (A) Not Present

## 2018-05-15 ENCOUNTER — Ambulatory Visit: Admit: 2018-05-15 | Discharge: 2018-05-16 | Payer: MEDICARE

## 2018-05-15 ENCOUNTER — Ambulatory Visit: Admit: 2018-05-15 | Discharge: 2018-05-16 | Payer: MEDICARE | Attending: Adult Health | Primary: Adult Health

## 2018-05-15 ENCOUNTER — Other Ambulatory Visit: Admit: 2018-05-15 | Discharge: 2018-05-16 | Payer: MEDICARE

## 2018-05-15 DIAGNOSIS — D701 Agranulocytosis secondary to cancer chemotherapy: Secondary | ICD-10-CM

## 2018-05-15 DIAGNOSIS — C9110 Chronic lymphocytic leukemia of B-cell type not having achieved remission: Principal | ICD-10-CM

## 2018-05-15 DIAGNOSIS — D6959 Other secondary thrombocytopenia: Secondary | ICD-10-CM

## 2018-05-15 DIAGNOSIS — T451X5A Adverse effect of antineoplastic and immunosuppressive drugs, initial encounter: Secondary | ICD-10-CM

## 2018-05-15 LAB — CBC W/ AUTO DIFF
BASOPHILS RELATIVE PERCENT: 0.9 %
EOSINOPHILS ABSOLUTE COUNT: 0.1 10*9/L (ref 0.0–0.4)
EOSINOPHILS RELATIVE PERCENT: 3.5 %
HEMATOCRIT: 35.8 % — ABNORMAL LOW (ref 36.0–46.0)
LARGE UNSTAINED CELLS: 2 % (ref 0–4)
LYMPHOCYTES RELATIVE PERCENT: 31.6 %
MEAN CORPUSCULAR HEMOGLOBIN CONC: 33.7 g/dL (ref 31.0–37.0)
MEAN CORPUSCULAR HEMOGLOBIN: 29.9 pg (ref 26.0–34.0)
MEAN CORPUSCULAR VOLUME: 88.7 fL (ref 80.0–100.0)
MEAN PLATELET VOLUME: 10.7 fL — ABNORMAL HIGH (ref 7.0–10.0)
MONOCYTES ABSOLUTE COUNT: 0.2 10*9/L (ref 0.2–0.8)
NEUTROPHILS ABSOLUTE COUNT: 1.7 10*9/L — ABNORMAL LOW (ref 2.0–7.5)
NEUTROPHILS RELATIVE PERCENT: 55.3 %
PLATELET COUNT: 23 10*9/L — ABNORMAL LOW (ref 150–440)
RED BLOOD CELL COUNT: 4.04 10*12/L (ref 4.00–5.20)
RED CELL DISTRIBUTION WIDTH: 17.4 % — ABNORMAL HIGH (ref 12.0–15.0)
WBC ADJUSTED: 3.1 10*9/L — ABNORMAL LOW (ref 4.5–11.0)

## 2018-05-15 LAB — COMPREHENSIVE METABOLIC PANEL
ALBUMIN: 3.7 g/dL (ref 3.5–5.0)
ALT (SGPT): 13 U/L (ref <35)
ANION GAP: 8 mmol/L (ref 7–15)
AST (SGOT): 20 U/L (ref 14–38)
BILIRUBIN TOTAL: 0.6 mg/dL (ref 0.0–1.2)
BLOOD UREA NITROGEN: 19 mg/dL (ref 7–21)
BUN / CREAT RATIO: 23
CALCIUM: 8.9 mg/dL (ref 8.5–10.2)
CHLORIDE: 109 mmol/L — ABNORMAL HIGH (ref 98–107)
CO2: 22 mmol/L (ref 22.0–30.0)
CREATININE: 0.84 mg/dL (ref 0.60–1.00)
EGFR CKD-EPI AA FEMALE: 76 mL/min/{1.73_m2} (ref >=60)
EGFR CKD-EPI NON-AA FEMALE: 66 mL/min/{1.73_m2} (ref >=60)
GLUCOSE RANDOM: 100 mg/dL (ref 70–179)
POTASSIUM: 3.9 mmol/L (ref 3.5–5.0)
PROTEIN TOTAL: 6.5 g/dL (ref 6.5–8.3)
SODIUM: 139 mmol/L (ref 135–145)

## 2018-05-15 LAB — PROTEIN TOTAL: Protein:MCnc:Pt:Ser/Plas:Qn:: 6.5

## 2018-05-15 LAB — MONOCYTES ABSOLUTE COUNT: Lab: 0.2

## 2018-05-15 LAB — PHOSPHORUS: Phosphate:MCnc:Pt:Ser/Plas:Qn:: 4

## 2018-05-15 LAB — URIC ACID: Urate:MCnc:Pt:Ser/Plas:Qn:: 5.9

## 2018-05-15 NOTE — Unmapped (Signed)
Pt tolerated procedure without difficulty. Understands to follow up with provider as recommended. No questions at time of d/c. Pt left infusion center via wheelchair with family member escorting. NAD, no issues voiced at time of d/c. Port flushed then de-accessed prior to d/c.

## 2018-05-15 NOTE — Unmapped (Signed)
RCWSL port accessed with 20 x 3/4 needle, labs drawn, sent for analysis. Pt on her way to clinic.

## 2018-05-15 NOTE — Unmapped (Signed)
Plan:  Bone marrow biopsy today  Labs twice weekly  If platelets are still not improving at visit with Dr. Lonni Fix in 2 weeks, she will decide on a plan for you.  Dr. Lonni Fix will call you with results of Bone marrow biopsy    Please return in 2 weeks for a follow up visit and labs.    If you have any questions, please do not hesitate to contact us.    Please contact us for any of these symptoms:    **Fevers over 100.4 are an EMERGENCY. Please go to the nearest ER**    1. New bone pain  2. Unintentional weight loss  3. Unexplained fatigue  4. Swelling of your legs  5. Yellowing of the skin or eyes    When reviewing your results, please remember that the results of many of the tests we order can vary somewhat and that variation often means nothing.  Sometimes when we get results back after your clinic visit, if it looks like there???s some variation of that type, we may decide to recheck things sooner than we discussed in clinic.  If you get a call that we want to recheck things sooner, do not panic. It does not mean that things are going wrong.    For appointments & questions Monday through Friday 8 AM??? 5 PM   please call [phone] or Toll free [phone].    On Nights, Weekends and Holidays  Call [phone] and ask for the adult hematologist/oncologist on call.    Markus Jarvis, AGPCNP-C, MSN, OCN  Nurse Practitioner  Hematologic Malignancies  [facility]    Nurse Navigator: Frankey Poot, RN  Questions and appointments M-F 8am - 5pm: [phone] or [phone]    [facility]  [address]  [place], [place] [address]  [email or web address]    Results for orders placed or performed in visit on 05/15/18   Comprehensive Metabolic Panel   Result Value Ref Range    Sodium 139 135 - 145 mmol/L    Potassium 3.9 3.5 - 5.0 mmol/L    Chloride 109 (H) 98 - 107 mmol/L    Anion Gap 8 7 - 15 mmol/L    CO2 22.0 22.0 - 30.0 mmol/L    BUN 19 7 - 21 mg/dL    Creatinine 3.66 4.40 - 1.00 mg/dL    BUN/Creatinine Ratio 23     EGFR CKD-EPI Non-African American, Female 66 >=60 mL/min/1.26m2    EGFR CKD-EPI African American, Female 47 >=60 mL/min/1.52m2    Glucose 100 70 - 179 mg/dL    Calcium 8.9 8.5 - 34.7 mg/dL    Albumin 3.7 3.5 - 5.0 g/dL    Total Protein 6.5 6.5 - 8.3 g/dL    Total Bilirubin 0.6 0.0 - 1.2 mg/dL    AST 20 14 - 38 U/L    ALT 13 <35 U/L    Alkaline Phosphatase 89 38 - 126 U/L   Uric acid   Result Value Ref Range    Uric Acid 5.9 3.0 - 6.5 mg/dL   Phosphorus Level   Result Value Ref Range    Phosphorus 4.0 2.9 - 4.7 mg/dL   CBC w/ Differential   Result Value Ref Range    WBC 3.1 (L) 4.5 - 11.0 10*9/L    RBC 4.04 4.00 - 5.20 10*12/L    HGB 12.1 12.0 - 16.0 g/dL    HCT 42.5 (L) 95.6 - 46.0 %    MCV 88.7 80.0 - 100.0  fL    MCH 29.9 26.0 - 34.0 pg    MCHC 33.7 31.0 - 37.0 g/dL    RDW 57.8 (H) 46.9 - 15.0 %    MPV 10.7 (H) 7.0 - 10.0 fL    Platelet 23 (L) 150 - 440 10*9/L    Variable HGB Concentration Slight (A) Not Present    Neutrophils % 55.3 %    Lymphocytes % 31.6 %    Monocytes % 6.6 %    Eosinophils % 3.5 %    Basophils % 0.9 %    Absolute Neutrophils 1.7 (L) 2.0 - 7.5 10*9/L    Absolute Lymphocytes 1.0 (L) 1.5 - 5.0 10*9/L    Absolute Monocytes 0.2 0.2 - 0.8 10*9/L    Absolute Eosinophils 0.1 0.0 - 0.4 10*9/L    Absolute Basophils 0.0 0.0 - 0.1 10*9/L    Large Unstained Cells 2 0 - 4 %    Anisocytosis Slight (A) Not Present    Hypochromasia Slight (A) Not Present

## 2018-05-15 NOTE — Unmapped (Signed)
Please leave dressing in place and keep it dry for 24 hrs before removing. You can resume normal activities tomorrow, but take things easy today.     You may take tylenol as needed for discomfort at the area where the biopsy was taken.   After receiving Ativan, please do not drive today.     If you have questions between 8am to 5 pm Monday through Friday please call [phone] and speak to the operator.      For emergencies, evenings or weekends, please call [phone] and ask for oncology fellow on call.     Reasons to call emergency line may include:   Fever of 100.5 or greater   Nausea and/or vomiting not relieved with nausea medicine   Diarrhea or constipation   Severe pain not relieved with usual pain regimen       Lab on 05/15/2018   Component Date Value Ref Range Status   ??? Sodium 05/15/2018 139  135 - 145 mmol/L Final   ??? Potassium 05/15/2018 3.9  3.5 - 5.0 mmol/L Final   ??? Chloride 05/15/2018 109* 98 - 107 mmol/L Final   ??? Anion Gap 05/15/2018 8  7 - 15 mmol/L Final   ??? CO2 05/15/2018 22.0  22.0 - 30.0 mmol/L Final   ??? BUN 05/15/2018 19  7 - 21 mg/dL Final   ??? Creatinine 05/15/2018 0.84  0.60 - 1.00 mg/dL Final   ??? BUN/Creatinine Ratio 05/15/2018 23   Final   ??? EGFR CKD-EPI Non-African American,* 05/15/2018 66  >=60 mL/min/1.65m2 Final   ??? EGFR CKD-EPI African American, Fem* 05/15/2018 76  >=60 mL/min/1.75m2 Final   ??? Glucose 05/15/2018 100  70 - 179 mg/dL Final   ??? Calcium 29/56/2130 8.9  8.5 - 10.2 mg/dL Final   ??? Albumin 86/57/8469 3.7  3.5 - 5.0 g/dL Final   ??? Total Protein 05/15/2018 6.5  6.5 - 8.3 g/dL Final   ??? Total Bilirubin 05/15/2018 0.6  0.0 - 1.2 mg/dL Final   ??? AST 62/95/2841 20  14 - 38 U/L Final   ??? ALT 05/15/2018 13  <35 U/L Final   ??? Alkaline Phosphatase 05/15/2018 89  38 - 126 U/L Final   ??? Uric Acid 05/15/2018 5.9  3.0 - 6.5 mg/dL Final   ??? Phosphorus 05/15/2018 4.0  2.9 - 4.7 mg/dL Final   ??? WBC 32/44/0102 3.1* 4.5 - 11.0 10*9/L Final   ??? RBC 05/15/2018 4.04  4.00 - 5.20 10*12/L Final   ??? HGB 05/15/2018 12.1  12.0 - 16.0 g/dL Final   ??? HCT 72/53/6644 35.8* 36.0 - 46.0 % Final   ??? MCV 05/15/2018 88.7  80.0 - 100.0 fL Final   ??? MCH 05/15/2018 29.9  26.0 - 34.0 pg Final   ??? MCHC 05/15/2018 33.7  31.0 - 37.0 g/dL Final   ??? RDW 03/47/4259 17.4* 12.0 - 15.0 % Final   ??? MPV 05/15/2018 10.7* 7.0 - 10.0 fL Final   ??? Platelet 05/15/2018 23* 150 - 440 10*9/L Final   ??? Variable HGB Concentration 05/15/2018 Slight* Not Present Final   ??? Neutrophils % 05/15/2018 55.3  % Final   ??? Lymphocytes % 05/15/2018 31.6  % Final   ??? Monocytes % 05/15/2018 6.6  % Final   ??? Eosinophils % 05/15/2018 3.5  % Final   ??? Basophils % 05/15/2018 0.9  % Final   ??? Absolute Neutrophils 05/15/2018 1.7* 2.0 - 7.5 10*9/L Final   ??? Absolute Lymphocytes 05/15/2018  1.0* 1.5 - 5.0 10*9/L Final   ??? Absolute Monocytes 05/15/2018 0.2  0.2 - 0.8 10*9/L Final   ??? Absolute Eosinophils 05/15/2018 0.1  0.0 - 0.4 10*9/L Final   ??? Absolute Basophils 05/15/2018 0.0  0.0 - 0.1 10*9/L Final   ??? Large Unstained Cells 05/15/2018 2  0 - 4 % Final   ??? Anisocytosis 05/15/2018 Slight* Not Present Final   ??? Hypochromasia 05/15/2018 Slight* Not Present Final

## 2018-05-16 NOTE — Unmapped (Signed)
Encounter addended by: Bernerd Limbo, FNP on: 05/16/2018 12:08 PM   Actions taken: Clinical Note Signed, LOS modified

## 2018-05-16 NOTE — Unmapped (Signed)
Date of Service: 05/15/2018      Patient Active Problem List   Diagnosis   ??? CLL (chronic lymphocytic leukemia) (CMS-HCC)   ??? Chronic migraine without aura   ??? 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 Left bundle branch block (LBBB)   ??? Hypercholesterolemia   ??? Chest pain, unspecified   ??? GERD (gastroesophageal reflux disease)   ??? Osteoporosis   ??? Needs flu shot   ??? BRBPR (bright red blood per rectum)   ??? Other autoimmune hemolytic anemias (CMS-HCC)    ??? B-cell chronic lymphocytic leukemia (CMS-HCC)   ??? Acute diarrhea   ??? Aortic atherosclerosis (CMS-HCC)   ??? Diverticulitis of colon   ??? Epigastric pain   ??? Gastritis, Helicobacter pylori   ??? H/O adenomatous polyp of colon   ??? Healthcare maintenance   ??? LLQ pain   ??? Lymphadenopathy   ??? Migraine without aura and without status migrainosus, not intractable   ??? Syncope and collapse   ??? White matter disease   ??? Vasovagal syncope   ??? Chemotherapy-induced neutropenia (CMS-HCC)   ??? Coccyx pain   ??? Chemotherapy induced diarrhea   ??? Chronic right shoulder pain   ??? Chemotherapy-induced thrombocytopenia       Indication:    Diagnosis ICD-10-CM Associated Orders   1. CLL (chronic lymphocytic leukemia) (CMS-HCC) C91.10 lidocaine (XYLOCAINE) 20 mg/mL (2 %) injection 5 mL     LORazepam (ATIVAN) injection 1 mg     Hematopathology Order     Hematopathology Order     Cytogenetics Cancer/FISH NON-BLOOD     Cytogenetics Cancer/FISH NON-BLOOD     Myeloid Mutation Panel - MDS & MPN     Myeloid Mutation Panel - MDS & MPN     Myeloid Mutation Panel - MDS & MPN     Myeloid Mutation Panel - MDS & MPN     Cytogenetics AP Order     Cytogenetics AP Order       Premedication: Ativan 1mg  IV    Ordering Provider: Salli Real, MD    Clinician(s) Performing Procedure:  Lorella Nimrod, FNP-BC        Bone Marrow Aspirate and Biopsy, right side    The procedure risks and alternatives of the procedure were explained to the patient.  The patient verbalized understanding and signed informed consent. After a time-out in which his patient identifiers were checked by 2 providers, the patient was laid in prone position on the table.   The  posterior superior iliac spine and iliac crest were cleaned, prepped and draped in the usual sterile fashion.     Anesthetic agent used: 2% plain lidocaine.      Utilizing a Ranfac needle, a bone marrow aspiration and biopsy was performed.  Specimen was sent for routine histopathologic stains and sectioning, flow cytometry and cytogenetics.     A pressure dressing was applied to the biopsy site.  Patient tolerated the procedure well.  Hemostasis was confirmed upon discharge.     The patient was given verbal instructions for wound care, such as to keep the biopsy site dry, covered for 24 hours, and to call your physician for a temperature > 100.5.  Tylenol may be taken for discomfort.    Specimens Collected:  EDTA x 2  Heparin x 1  Core biopsy x 1

## 2018-05-21 ENCOUNTER — Ambulatory Visit: Admit: 2018-05-21 | Discharge: 2018-05-21 | Payer: MEDICARE

## 2018-05-21 DIAGNOSIS — C9110 Chronic lymphocytic leukemia of B-cell type not having achieved remission: Secondary | ICD-10-CM

## 2018-05-21 LAB — CBC W/ AUTO DIFF
BASOPHILS ABSOLUTE COUNT: 0 10*9/L (ref 0.0–0.1)
BASOPHILS RELATIVE PERCENT: 0.4 %
EOSINOPHILS ABSOLUTE COUNT: 0.1 10*9/L (ref 0.0–0.4)
EOSINOPHILS RELATIVE PERCENT: 4.3 %
HEMATOCRIT: 35.7 % — ABNORMAL LOW (ref 36.0–46.0)
HEMOGLOBIN: 12.1 g/dL — ABNORMAL LOW (ref 13.5–16.0)
LARGE UNSTAINED CELLS: 4 % (ref 0–4)
LYMPHOCYTES ABSOLUTE COUNT: 1 10*9/L — ABNORMAL LOW (ref 1.5–5.0)
LYMPHOCYTES RELATIVE PERCENT: 36.8 %
MEAN CORPUSCULAR HEMOGLOBIN CONC: 33.9 g/dL (ref 31.0–37.0)
MEAN CORPUSCULAR HEMOGLOBIN: 29.9 pg (ref 26.0–34.0)
MEAN CORPUSCULAR VOLUME: 88.2 fL (ref 80.0–100.0)
MEAN PLATELET VOLUME: 9.6 fL (ref 7.0–10.0)
MONOCYTES RELATIVE PERCENT: 9.5 %
PLATELET COUNT: 31 10*9/L — ABNORMAL LOW (ref 150–440)
RED BLOOD CELL COUNT: 4.05 10*12/L (ref 4.00–5.20)
RED CELL DISTRIBUTION WIDTH: 17.1 % — ABNORMAL HIGH (ref 12.0–15.0)
WBC ADJUSTED: 2.8 10*9/L — ABNORMAL LOW (ref 4.5–11.0)

## 2018-05-21 LAB — HEMOGLOBIN: Lab: 12.1 — ABNORMAL LOW

## 2018-05-21 NOTE — Unmapped (Signed)
Infusion on 05/21/2018   Component Date Value Ref Range Status   ??? WBC 05/21/2018 2.8* 4.5 - 11.0 10*9/L Final   ??? RBC 05/21/2018 4.05  4.00 - 5.20 10*12/L Final   ??? HGB 05/21/2018 12.1* 13.5 - 16.0 g/dL Final   ??? HCT 16/01/9603 35.7* 36.0 - 46.0 % Final   ??? MCV 05/21/2018 88.2  80.0 - 100.0 fL Final   ??? MCH 05/21/2018 29.9  26.0 - 34.0 pg Final   ??? MCHC 05/21/2018 33.9  31.0 - 37.0 g/dL Final   ??? RDW 54/12/8117 17.1* 12.0 - 15.0 % Final   ??? MPV 05/21/2018 9.6  7.0 - 10.0 fL Final   ??? Platelet 05/21/2018 31* 150 - 440 10*9/L Final   ??? Variable HGB Concentration 05/21/2018 Slight* Not Present Final   ??? Neutrophils % 05/21/2018 45.4  % Final   ??? Lymphocytes % 05/21/2018 36.8  % Final   ??? Monocytes % 05/21/2018 9.5  % Final   ??? Eosinophils % 05/21/2018 4.3  % Final   ??? Basophils % 05/21/2018 0.4  % Final   ??? Absolute Neutrophils 05/21/2018 1.3* 2.0 - 7.5 10*9/L Final   ??? Absolute Lymphocytes 05/21/2018 1.0* 1.5 - 5.0 10*9/L Final   ??? Absolute Monocytes 05/21/2018 0.3  0.2 - 0.8 10*9/L Final   ??? Absolute Eosinophils 05/21/2018 0.1  0.0 - 0.4 10*9/L Final   ??? Absolute Basophils 05/21/2018 0.0  0.0 - 0.1 10*9/L Final   ??? Large Unstained Cells 05/21/2018 4  0 - 4 % Final   ??? Anisocytosis 05/21/2018 Slight* Not Present Final

## 2018-05-21 NOTE — Unmapped (Signed)
Pt arrived for lab check. Denies any issues. Port accessed. CBC collected and sent to lab.  Lab results do not meet requirement for transfusion or injection today. Port heparin locked and deaccessed. Pt given AVS and discharged in NAD.

## 2018-05-23 ENCOUNTER — Other Ambulatory Visit: Payer: Self-pay | Admitting: Internal Medicine

## 2018-05-23 DIAGNOSIS — Z1231 Encounter for screening mammogram for malignant neoplasm of breast: Secondary | ICD-10-CM

## 2018-05-24 ENCOUNTER — Ambulatory Visit: Admit: 2018-05-24 | Discharge: 2018-05-25 | Payer: MEDICARE

## 2018-05-24 DIAGNOSIS — C9110 Chronic lymphocytic leukemia of B-cell type not having achieved remission: Secondary | ICD-10-CM

## 2018-05-24 LAB — CBC W/ AUTO DIFF
BASOPHILS ABSOLUTE COUNT: 0 10*9/L (ref 0.0–0.1)
BASOPHILS RELATIVE PERCENT: 0.4 %
EOSINOPHILS ABSOLUTE COUNT: 0.1 10*9/L (ref 0.0–0.4)
EOSINOPHILS RELATIVE PERCENT: 5 %
HEMATOCRIT: 34.8 % — ABNORMAL LOW (ref 36.0–46.0)
LARGE UNSTAINED CELLS: 4 % (ref 0–4)
LYMPHOCYTES ABSOLUTE COUNT: 0.9 10*9/L — ABNORMAL LOW (ref 1.5–5.0)
LYMPHOCYTES RELATIVE PERCENT: 32.8 %
MEAN CORPUSCULAR HEMOGLOBIN CONC: 33.5 g/dL (ref 31.0–37.0)
MEAN CORPUSCULAR HEMOGLOBIN: 29.6 pg (ref 26.0–34.0)
MEAN CORPUSCULAR VOLUME: 88.3 fL (ref 80.0–100.0)
MEAN PLATELET VOLUME: 9.5 fL (ref 7.0–10.0)
MONOCYTES ABSOLUTE COUNT: 0.2 10*9/L (ref 0.2–0.8)
MONOCYTES RELATIVE PERCENT: 8.3 %
NEUTROPHILS ABSOLUTE COUNT: 1.3 10*9/L — ABNORMAL LOW (ref 2.0–7.5)
NEUTROPHILS RELATIVE PERCENT: 50 %
PLATELET COUNT: 30 10*9/L — ABNORMAL LOW (ref 150–440)
RED BLOOD CELL COUNT: 3.94 10*12/L — ABNORMAL LOW (ref 4.00–5.20)
RED CELL DISTRIBUTION WIDTH: 17.1 % — ABNORMAL HIGH (ref 12.0–15.0)

## 2018-05-24 LAB — EOSINOPHILS ABSOLUTE COUNT: Lab: 0.1

## 2018-05-24 NOTE — Unmapped (Signed)
Followup Visit Note    Patient Name: Katrina Gould  Patient Age: 78 y.o.  Encounter Date: 05/24/2018    Referring Physician:   Vernie Murders, AGNP  [address]  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  --Discussed physical therapy for the neck and advocated isometric exercises.  She may also try topical agents.  --Try switching Promacta to taking at bedtime.  --The patient has follow-up scheduled with Dr. Lonni Fix in the near future.  The patient was given the results of her recent bone marrow biopsy and FISH panel      Cancer Staging  No matching staging information was found for the patient.    I have reviewed the laboratory, pathology, and radiology reports in detail and discussed findings with the patient    Interval History:  Patient has to be seen because of headaches and to review the recent bone marrow results.  He has CLL, and is on salvage therapy with venetoclax and epratuzumab.  She started on venetoclax in June 2019, and received obinutuzumab from then until October.  When starting on therapy patient's platelet counts were as low as 10,000 mcL.  Neutrophils were initially in a good range, but have fallen below thousand at times during the treatment course.  Day, neutrophils are thousand 300 and the platelet count is relatively stable at 30,000.  In May 2019, the marrow showed hypercellularity with 80% CLL.  On 03/22/2018, the marrow showed normal cellularity with less than 5% residual involvement by CLL and background of reactive lymphoid aggregates.  Bone marrow biopsy done on 05/15/2018 showed mildly hypercellular marrow 40% with trilineage paraparesis and persistent low level, but less than 1%, involvement by CLL.  FISH for CLL was without abnormalities.  The headache started sometime shortly after she had a marrow.  The headaches are on the top of her scalp, and feel like the top of the heads coming off.  If she lies down, the headaches will normally go away.  Is had migraines in the past, but this is not like those.  She denies visual phenomena, nausea, visual flushing.  She has not tried to take any medications.          Oncology History    CLL    First seen in oncology clinic locally 03/31/16    Had a left axillary biopsy 03/15/16 that confirmed CLL/SLL: Flow showed monoclonal B-cell population with CD5, CD23, kappa light chain. IHC showed positive CD20 (diffuse), negative for cyclin D1.    No constitutional symptoms except some sweats    Labs: Hb 12.0, Hct 34.8, plt 147. WBC was 10.6 with ANC 2.5. ALC was 7.5K.    CLL FISH studies: normal panel. No cells with 11q, tri 12, 17p or 13q, and the 11;14 translocation also not detected.    Karyotype: 46,XX,der(4)t(4;4)(p14;q21)[3]/45,XX,dic(6;17)(q12;p11.2) (no loss of TP53 on FISH)    Molecular: No TP53 mutation    CLL-IPI: As per Lancet Oncol Vol 15 October 2014  Includes (1)  TP53 status (no abnormalities vs. 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 MRD = 0.32%.            CLL (chronic lymphocytic leukemia) (CMS-HCC)    05/16/2016 Initial Diagnosis     CLL (chronic lymphocytic leukemia) (RAF-HCC)      09/14/2017 - 10/05/2017 Chemotherapy     Rituximab weekly x4  No response      10/23/2017 -  Chemotherapy     Began ven/obi      03/19/2018 Adverse Reaction     Obinutuzumab held given persistent thrombocytopenia. Marrow performed on 03/22/18, patient with no overt CLL (0.32% by MRD flow testing from [facility]). Do not plan to give 6th cycle of obi (unless counts recover in a reasonable timeframe).      04/09/2018 Adverse Reaction     ANC down to 0.2. Held venetoclax (was previously at 200 mg daily).        B-cell chronic lymphocytic leukemia (CMS-HCC)    09/05/2017 - 10/11/2017 Chemotherapy     Chemotherapy Treatment    Treatment Goal Control   Line of Treatment [No plan line of treatment]   Plan Name OP RITUXIMAB    Start Date 09/14/2017   End Date 10/05/2017   Provider Pernell Dupre, MD   Chemotherapy riTUXimab (RITUXAN) 712.5 mg in sodium chloride (NS) 0.9 % 500 mL IVPB, 375 mg/m2 = 712.5 mg, Intravenous, Once, 1 of 1 cycle  Administration: 712.5 mg (09/14/2017)  riTUXimab (RITUXAN) 712.5 mg in sodium chloride (NS) 0.9 % 250 mL rapid infusion, 375 mg/m2 = 712.5 mg, Intravenous, Once, 1 of 1 cycle  Administration: 712.5 mg (09/21/2017), 712.5 mg (09/28/2017), 712.5 mg (10/05/2017)         10/16/2017 Initial Diagnosis     Chronic lymphocytic leukemia, Rai stage IV (CMS-HCC)      10/23/2017 -  Chemotherapy     OP OBINUTUZUMAB AND VENETOCLAX  chlorambucil 0.5 mg/kg PO on days 1, 15, obinutuzumab 100 mg IV on day 1, obinutuzumab 900 mg IV on day 2, obinutuzumab 1,000 mg IV on days 8,15 on Cycle 1, then obinutuzumab 1,000 mg IV on day 1 for sequent cycles, every 28 days. The following portions of the patient's history were reviewed and updated as appropriate: past family history, past social history and past surgical history.    Review of Systems   Constitutional: Negative for appetite change, chills, diaphoresis, fatigue, fever and unexpected weight change.   HENT:   Negative for mouth sores, nosebleeds and trouble swallowing.    Respiratory: Negative for chest tightness, cough and hemoptysis.    Cardiovascular: Negative for chest pain.  Gastrointestinal: Negative for abdominal pain and blood in stool.   Genitourinary: Negative for difficulty urinating, hematuria and vaginal bleeding.    Musculoskeletal: Positive for arthralgias, back pain, neck pain and neck stiffness. Negative for gait problem.   Skin: Negative for rash.   Neurological: Positive for headaches. Negative for dizziness, extremity weakness, gait problem, light-headedness, numbness, seizures and speech difficulty.   Hematological: Negative for adenopathy.   Psychiatric/Behavioral: Negative for confusion.   All other systems reviewed and are negative.      Vital signs for this encounter:  BSA: 1.95 meters squared  BP 162/73  - Pulse 60  - Temp 36.7 ??C (98.1 ??F) (Oral)  - Resp 16  - Wt 80.4 kg (177 lb 5.8 oz)  - BMI 27.77 kg/m??      Physical Exam   Constitutional: She is oriented to person, place, and time. She appears well-developed and well-nourished. No distress.   HENT:   Head: Normocephalic.   Mouth/Throat: Oropharynx is clear and moist.   Palpation of the skull reveals no masses, but area of tenderness (about 3 to 4 cm in diameter) to palpation on the posterior parietal skull in the midline.  No radiation and tenderness, or radiation from the neck to the scalp with motion.   Eyes: Pupils are equal, round, and reactive to light. EOM are normal.   Neck: No JVD present. No thyromegaly present.   Cardiovascular: Normal rate, regular rhythm and normal heart sounds.   Pulmonary/Chest: Effort normal and breath sounds normal.   Abdominal: Soft. Bowel sounds are normal. She exhibits no distension and no mass. There is no tenderness.   Musculoskeletal:         General: No edema.      Comments: Mildly decreased range of motion of the neck.  Tenderness to palpation over the base of the neck posteriorly.  Contracture of the right shoulder to 45 degrees.   Lymphadenopathy:     She has no cervical adenopathy.   No adenopathy in the neck, supraclavicular, axillary regions.   Neurological: She is alert and oriented to person, place, and time. No cranial nerve deficit. She exhibits normal muscle tone. Coordination normal.   Skin: No rash noted.   Psychiatric: She has a normal mood and affect. Her behavior is normal.       Karnofsky/Lansky Performance Status  80, Normal activity with effort; some signs or symptoms of disease (ECOG equivalent 1)     Results:    WBC   Date Value Ref Range Status   05/24/2018 2.7 (L) 4.5 - 11.0 10*9/L Final     HGB   Date Value Ref Range Status   05/24/2018 11.7 (L) 13.5 - 16.0 g/dL Final     HCT   Date Value Ref Range Status   05/24/2018 34.8 (L) 36.0 - 46.0 % Final     Platelet   Date Value Ref Range Status   05/24/2018 30 (L) 150 - 440 10*9/L Final     LDH   Date Value Ref Range Status   02/14/2018 821 (H) 338 - 610 U/L Final     Creatinine   Date Value Ref Range Status   05/15/2018 0.84 0.60 - 1.00 mg/dL Final     AST   Date Value Ref Range Status   05/15/2018 20 14 - 38 U/L Final

## 2018-05-24 NOTE — Unmapped (Addendum)
Infusion on 05/24/2018   Component Date Value Ref Range Status   ??? WBC 05/24/2018 2.7* 4.5 - 11.0 10*9/L Final   ??? RBC 05/24/2018 3.94* 4.00 - 5.20 10*12/L Final   ??? HGB 05/24/2018 11.7* 13.5 - 16.0 g/dL Final   ??? HCT 16/01/9603 34.8* 36.0 - 46.0 % Final   ??? MCV 05/24/2018 88.3  80.0 - 100.0 fL Final   ??? MCH 05/24/2018 29.6  26.0 - 34.0 pg Final   ??? MCHC 05/24/2018 33.5  31.0 - 37.0 g/dL Final   ??? RDW 54/12/8117 17.1* 12.0 - 15.0 % Final   ??? MPV 05/24/2018 9.5  7.0 - 10.0 fL Final   ??? Platelet 05/24/2018 30* 150 - 440 10*9/L Final   ??? Variable HGB Concentration 05/24/2018 Slight* Not Present Final   ??? Neutrophils % 05/24/2018 50.0  % Final   ??? Lymphocytes % 05/24/2018 32.8  % Final   ??? Monocytes % 05/24/2018 8.3  % Final   ??? Eosinophils % 05/24/2018 5.0  % Final   ??? Basophils % 05/24/2018 0.4  % Final   ??? Absolute Neutrophils 05/24/2018 1.3* 2.0 - 7.5 10*9/L Final   ??? Absolute Lymphocytes 05/24/2018 0.9* 1.5 - 5.0 10*9/L Final   ??? Absolute Monocytes 05/24/2018 0.2  0.2 - 0.8 10*9/L Final   ??? Absolute Eosinophils 05/24/2018 0.1  0.0 - 0.4 10*9/L Final   ??? Absolute Basophils 05/24/2018 0.0  0.0 - 0.1 10*9/L Final   ??? Large Unstained Cells 05/24/2018 4  0 - 4 % Final   ??? Anisocytosis 05/24/2018 Slight* Not Present Final       --The cause for your headaches is not immediately clear.  Try taking the Promacta at bedtime, to see if this changes the pattern of your headaches.  You may take Tylenol to help with the headaches.  Try drinking more tea, to see if this helps with the headaches.  Try the exercises that we discussed for the neck, to see if (over the next 1 to 2 weeks) whether these exercises also help with the headaches.  In the meantime, if lying flat relieves the headaches, continue to do so as needed.  --For the shoulder and neck, in addition to the exercises of been discussed, consider trial of topical heat (a heating pad at a low setting), and topical agents.  Topical agents include topical nonsteroidal anti-inflammatory drugs (for example Aspercreme), agents that include menthol (example Icy & Hot), capsaicin (this is a chili pepper extract), and agents that work by mechanisms are not entirely clear (example Blue Emu).  There is a prescription nonsteroidal anti-inflammatory (Voltaren or diclofenac) that is sometimes useful as well.  You might also try putting lidocaine patches on your neck.  --The pain that you are experiencing in your mid thoracic spine is likely a combination of mild scoliosis and arthritis.  The exercises illustrated below for the lower back sometimes help relieve pain in the mid thoracic spine as well.  If the exercises are to help, they usually take a week or 2 to be effective.  If any of the exercises illustrated cause increased pain, do not do those exercises.          If you feel like this is an emergency please call 911.  For appointments or questions Monday through Friday 8AM-5PM please call [phone] or Toll Free [phone]. For Medical questions or concerns ask for the Nurse Triage Line.  On Nights, Weekends, and Holidays call [phone] and ask for the Oncologist on  Call.  Reasons to call the Nurse Triage Line:  Fever of 100.5 or greater  Nausea and/or vomiting not relived with nausea medicine  Diarrhea or constipation  Severe pain not relieved with usual pain regimen  Shortness of breath  Uncontrolled bleeding  Mental status changes    Patient Education        Low Back Pain: Exercises  Introduction  Here are some examples of exercises for you to try. The exercises may be suggested for a condition or for rehabilitation. Start each exercise slowly. Ease off the exercises if you start to have pain.  You will be told when to start these exercises and which ones will work best for you.  How to do the exercises  Press-up   1. Lie on your stomach, supporting your body with your forearms.  2. Press your elbows down into the floor to raise your upper back. As you do this, relax your stomach muscles and allow your back to arch without using your back muscles. As your press up, do not let your hips or pelvis come off the floor.  3. Hold for 15 to 30 seconds, then relax.  4. Repeat 2 to 4 times.    Alternate arm and leg (bird dog) exercise   1. Start on the floor, on your hands and knees.  2. Tighten your belly muscles.  3. Raise one leg off the floor, and hold it straight out behind you. Be careful not to let your hip drop down, because that will twist your trunk.  4. Hold for about 6 seconds, then lower your leg and switch to the other leg.  5. Repeat 8 to 12 times on each leg.  6. Over time, work up to holding for 10 to 30 seconds each time.  7. If you feel stable and secure with your leg raised, try raising the opposite arm straight out in front of you at the same time.    Knee-to-chest exercise   1. Lie on your back with your knees bent and your feet flat on the floor.  2. Bring one knee to your chest, keeping the other foot flat on the floor (or keeping the other leg straight, whichever feels better on your lower back).  3. Keep your lower back pressed to the floor. Hold for at least 15 to 30 seconds.  4. Relax, and lower the knee to the starting position.  5. Repeat with the other leg. Repeat 2 to 4 times with each leg.  6. To get more stretch, put your other leg flat on the floor while pulling your knee to your chest.    Curl-ups   1. Lie on the floor on your back with your knees bent at a 90-degree angle. Your feet should be flat on the floor, about 12 inches from your buttocks.  2. Cross your arms over your chest. If this bothers your neck, try putting your hands behind your neck (not your head), with your elbows spread apart.  3. Slowly tighten your belly muscles and raise your shoulder blades off the floor.  4. Keep your head in line with your body, and do not press your chin to your chest.  5. Hold this position for 1 or 2 seconds, then slowly lower yourself back down to the floor. 6. Repeat 8 to 12 times.    Pelvic tilt exercise   1. Lie on your back with your knees bent.  2. Brace your stomach. This means to tighten your  muscles by pulling in and imagining your belly button moving toward your spine. You should feel like your back is pressing to the floor and your hips and pelvis are rocking back.  3. Hold for about 6 seconds while you breathe smoothly.  4. Repeat 8 to 12 times.    Heel dig bridging   1. Lie on your back with both knees bent and your ankles bent so that only your heels are digging into the floor. Your knees should be bent about 90 degrees.  2. Then push your heels into the floor, squeeze your buttocks, and lift your hips off the floor until your shoulders, hips, and knees are all in a straight line.  3. Hold for about 6 seconds as you continue to breathe normally, and then slowly lower your hips back down to the floor and rest for up to 10 seconds.  4. Do 8 to 12 repetitions.    Hamstring stretch in doorway   1. Lie on your back in a doorway, with one leg through the open door.  2. Slide your leg up the wall to straighten your knee. You should feel a gentle stretch down the back of your leg.  3. Hold the stretch for at least 15 to 30 seconds. Do not arch your back, point your toes, or bend either knee. Keep one heel touching the floor and the other heel touching the wall.  4. Repeat with your other leg.  5. Do 2 to 4 times for each leg.    Hip flexor stretch   1. Kneel on the floor with one knee bent and one leg behind you. Place your forward knee over your foot. Keep your other knee touching the floor.  2. Slowly push your hips forward until you feel a stretch in the upper thigh of your rear leg.  3. Hold the stretch for at least 15 to 30 seconds. Repeat with your other leg.  4. Do 2 to 4 times on each side.    Wall sit   1. Stand with your back 10 to 12 inches away from a wall.  2. Lean into the wall until your back is flat against it.  3. Slowly slide down until your knees are slightly bent, pressing your lower back into the wall.  4. Hold for about 6 seconds, then slide back up the wall.  5. Repeat 8 to 12 times.    Follow-up care is a key part of your treatment and safety. Be sure to make and go to all appointments, and call your doctor if you are having problems. It's also a good idea to know your test results and keep a list of the medicines you take.  Where can you learn more?  Go to [facility] at [email or web address]  [facility] [employer]. Enter [phone] in the search box to learn more about Low Back Pain: Exercises.  Current as of: October 24, 2017  Content Version: 12.3  ?? 2006-2019 [employer]. Care instructions adapted under license by [facility]. If you have questions about a medical condition or this instruction, always ask your healthcare professional. [employer], [employer] disclaims any warranty or liability for your use of this information.         Patient Education        Neck: Exercises  Introduction  Here are some examples of exercises for you to try. The exercises may be suggested for a condition or for rehabilitation. Start each exercise slowly.  Ease off the exercises if you start to have pain.  You will be told when to start these exercises and which ones will work best for you.  How to do the exercises  Neck stretch   1. This stretch works best if you keep your shoulder down as you lean away from it. To help you remember to do this, start by relaxing your shoulders and lightly holding on to your thighs or your chair.  2. Tilt your head toward your shoulder and hold for 15 to 30 seconds. Let the weight of your head stretch your muscles.  3. If you would like a little added stretch, use your hand to gently and steadily pull your head toward your shoulder. For example, keeping your right shoulder down, lean your head to the left.  4. Repeat 2 to 4 times toward each shoulder.    Diagonal neck stretch   1. Turn your head slightly toward the direction you will be stretching, and tilt your head diagonally toward your chest and hold for 15 to 30 seconds.  2. If you would like a little added stretch, use your hand to gently and steadily pull your head forward on the diagonal.  3. Repeat 2 to 4 times toward each side.    Dorsal glide stretch   1. Sit or stand tall and look straight ahead.  2. Slowly tuck your chin as you glide your head backward over your body  3. Hold for a count of 6, and then relax for up to 10 seconds.  4. Repeat 8 to 12 times.    Chest and shoulder stretch   1. Sit or stand tall and glide your head backward as in the dorsal glide stretch.  2. Raise both arms so that your hands are next to your ears.  3. Take a deep breath, and as you breathe out, lower your elbows down and behind your back. You will feel your shoulder blades slide down and together, and at the same time you will feel a stretch across your chest and the front of your shoulders.  4. Hold for about 6 seconds, and then relax for up to 10 seconds.  5. Repeat 8 to 12 times.    Strengthening: Hands on head   1. Move your head backward, forward, and side to side against gentle pressure from your hands, holding each position for about 6 seconds.  2. Repeat 8 to 12 times.    Follow-up care is a key part of your treatment and safety. Be sure to make and go to all appointments, and call your doctor if you are having problems. It's also a good idea to know your test results and keep a list of the medicines you take.  Where can you learn more?  Go to [facility] at https://myuncchart.org  [facility] [employer]. Enter P975 in the search box to learn more about Neck: Exercises.  Current as of: October 24, 2017  Content Version: 12.3  ?? 2006-2019 [employer]. Care instructions adapted under license by [facility]. If you have questions about a medical condition or this instruction, always ask your healthcare professional. [employer] disclaims any warranty or liability for your use of this information.

## 2018-05-24 NOTE — Unmapped (Addendum)
Patient arrived for lab check for possible granix/platelet transfusion. Platelet count 30,000 - does not meet requirements for transfusion. ANC 1.3 doesn ot meet parameters for granix.    Patient complaining of headache starting at the crown of head radiating forward. Describes it as an 8/10 beginning two weeks ago it comes and goes without intervention lasting appx 20 minutes    Ms Katrina Gould was assessed and discharged by MD [place].

## 2018-05-28 ENCOUNTER — Other Ambulatory Visit: Admit: 2018-05-28 | Discharge: 2018-05-28 | Payer: MEDICARE

## 2018-05-28 ENCOUNTER — Ambulatory Visit
Admit: 2018-05-28 | Discharge: 2018-05-28 | Payer: MEDICARE | Attending: Hematology & Oncology | Primary: Hematology & Oncology

## 2018-05-28 ENCOUNTER — Ambulatory Visit: Admit: 2018-05-28 | Discharge: 2018-05-28 | Payer: MEDICARE

## 2018-05-28 DIAGNOSIS — C9110 Chronic lymphocytic leukemia of B-cell type not having achieved remission: Secondary | ICD-10-CM

## 2018-05-28 DIAGNOSIS — T451X5A Adverse effect of antineoplastic and immunosuppressive drugs, initial encounter: Secondary | ICD-10-CM

## 2018-05-28 DIAGNOSIS — D701 Agranulocytosis secondary to cancer chemotherapy: Secondary | ICD-10-CM

## 2018-05-28 LAB — CBC W/ AUTO DIFF
BASOPHILS ABSOLUTE COUNT: 0 10*9/L (ref 0.0–0.1)
EOSINOPHILS ABSOLUTE COUNT: 0.1 10*9/L (ref 0.0–0.4)
EOSINOPHILS RELATIVE PERCENT: 4 %
HEMATOCRIT: 36.7 % (ref 36.0–46.0)
HEMOGLOBIN: 12.4 g/dL (ref 12.0–16.0)
LARGE UNSTAINED CELLS: 2 % (ref 0–4)
LYMPHOCYTES ABSOLUTE COUNT: 0.7 10*9/L — ABNORMAL LOW (ref 1.5–5.0)
LYMPHOCYTES RELATIVE PERCENT: 23.1 %
MEAN CORPUSCULAR HEMOGLOBIN CONC: 33.9 g/dL (ref 31.0–37.0)
MEAN CORPUSCULAR HEMOGLOBIN: 29.9 pg (ref 26.0–34.0)
MEAN CORPUSCULAR VOLUME: 88.1 fL (ref 80.0–100.0)
MONOCYTES ABSOLUTE COUNT: 0.2 10*9/L (ref 0.2–0.8)
MONOCYTES RELATIVE PERCENT: 7.2 %
NEUTROPHILS RELATIVE PERCENT: 63 %
PLATELET COUNT: 35 10*9/L — ABNORMAL LOW (ref 150–440)
RED BLOOD CELL COUNT: 4.16 10*12/L (ref 4.00–5.20)
RED CELL DISTRIBUTION WIDTH: 16.9 % — ABNORMAL HIGH (ref 12.0–15.0)
WBC ADJUSTED: 2.9 10*9/L — ABNORMAL LOW (ref 4.5–11.0)

## 2018-05-28 LAB — PHOSPHORUS: Phosphate:MCnc:Pt:Ser/Plas:Qn:: 4.4

## 2018-05-28 LAB — URIC ACID: Urate:MCnc:Pt:Ser/Plas:Qn:: 6.3

## 2018-05-28 LAB — COMPREHENSIVE METABOLIC PANEL
ALBUMIN: 3.7 g/dL (ref 3.5–5.0)
ALT (SGPT): 13 U/L (ref <35)
ANION GAP: 7 mmol/L (ref 7–15)
AST (SGOT): 21 U/L (ref 14–38)
BILIRUBIN TOTAL: 0.5 mg/dL (ref 0.0–1.2)
BLOOD UREA NITROGEN: 17 mg/dL (ref 7–21)
BUN / CREAT RATIO: 21
CALCIUM: 9.1 mg/dL (ref 8.5–10.2)
CHLORIDE: 111 mmol/L — ABNORMAL HIGH (ref 98–107)
CO2: 21 mmol/L — ABNORMAL LOW (ref 22.0–30.0)
EGFR CKD-EPI AA FEMALE: 79 mL/min/{1.73_m2} (ref >=60)
EGFR CKD-EPI NON-AA FEMALE: 68 mL/min/{1.73_m2} (ref >=60)
GLUCOSE RANDOM: 107 mg/dL (ref 70–179)
POTASSIUM: 3.8 mmol/L (ref 3.5–5.0)
PROTEIN TOTAL: 6.3 g/dL — ABNORMAL LOW (ref 6.5–8.3)
SODIUM: 139 mmol/L (ref 135–145)

## 2018-05-28 LAB — NEUTROPHILS RELATIVE PERCENT: Lab: 63

## 2018-05-28 LAB — SODIUM: Sodium:SCnc:Pt:Ser/Plas:Qn:: 139

## 2018-05-28 NOTE — Unmapped (Signed)
[facility] Follow Up       Patient Name: Katrina Gould  Patient Age: 78 y.o.  Encounter Date: 05/28/2018      Chief complaint/Reason for visit: CLL/SLL    Assessment:  Katrina Gould is a 78 y.o. female who presents for follow of her CLL/SLL, Rai stage 4. Disease markers: 46,XX,der(4)t(4;4)(p14;q21). IGHV unmutated (0%, 3-33*01). B64m 5.05. No TP53 mutation. High-risk by CLL-IPI (6 points).    Katrina Gould continues to do fairly well though with arthritis pain. Told her to avoid NSAIDs but said tylenol ok and ok to try topical CBD products but advised against oral CBD due to possible med interaction.    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    BM bx shows still minimal CLL (~1%) - and no dysplasia to suggest MDS. Myeloid mutation panel still pending.     Possibly recovering ANC - hasn't needed granix since 1/3 and ANC is 1.8.     Plts might be increasing - up to 35 today.      Plans and Recommendations:  1. CLL, Rai 4   - Dose reduce Venetoclax to 100 mg when platelets recover to >100 (still on hold currently)  - Continue eltrombopag 50mg  PO daily  - if no improvement at next visit in 2 weeks, increase to 75mg  daily  - continue Granix for ANC <1.0 (plan updated to reflect PRN) - not needed today  - Continue  Labs but go to weekly given improvement  - f/u myeloid panel   - Follow up in 2 weeks    2. Diarrhea  - resolved    3. Pain related to falls  - continue tylenol, ice, or heat as needed for pain relief    4. Arthritis  - suggested prn tylenol and to avoid NSAIDs  -  5. Health Maintenance  - flu shot given this season  - continue annual dermatology follow up  - 1st Shingrix vaccine was given 06/2017; patient will need second vaccine, but recommend that she get this done at a pharmacy or at her PCP's office for insurance purposes    Due to this patient's diagnosis, she is at significant risk for subsequent morbidity and/or mortality.        Interval History:  Doing well since being seen last.  Still having shoulder pain related to arthritis. Has not tried any po meds. No b symptoms.    Otherwise, she denies new constitutional symptoms such as anorexia, weight loss, drenching night sweats or unexplained fevers.  Furthermore, she denies symptoms of marrow failure:  recurrent or unexplained intercurrent infections.  There have been no new or unexplained pains or self-identified masses, swelling or enlarged lymph nodes. Some bruising but no bleeding.    Past Medical, Surgical and Family History were reviewed and pertinent updates were made in the Electronic Medical Record  ??  Review of Systems:  Other than as reported above in the interim history, the other systems reviewed were unremarkable.  ??  ECOG Performance Status: 1     Oncology History:    Oncology History    CLL    First seen in oncology clinic locally 03/31/16    Had a left axillary biopsy 03/15/16 that confirmed CLL/SLL: Flow showed monoclonal B-cell population with CD5, CD23, kappa light chain. IHC showed positive CD20 (diffuse), negative for cyclin D1.    No constitutional symptoms except some sweats    Labs: Hb 12.0, Hct 34.8, plt 147. WBC was 10.6 with ANC  2.5. ALC was 7.5K.    CLL FISH studies: normal panel. No cells with 11q, tri 12, 17p or 13q, and the 11;14 translocation also not detected.    Karyotype: 46,XX,der(4)t(4;4)(p14;q21)[3]/45,XX,dic(6;17)(q12;p11.2) (no loss of TP53 on FISH)    Molecular: No TP53 mutation    CLL-IPI: As per Lancet Oncol Vol 15 October 2014  Includes (1) TP53 status (no abnormalities vs. 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 MRD = 0.32%.            CLL (chronic lymphocytic leukemia) (CMS-HCC)    05/16/2016 Initial Diagnosis     CLL (chronic lymphocytic leukemia) (RAF-HCC)      09/14/2017 - 10/05/2017 Chemotherapy     Rituximab weekly x4  No response      10/23/2017 -  Chemotherapy     Began ven/obi      03/19/2018 Adverse Reaction     Obinutuzumab held given persistent thrombocytopenia. Marrow performed on 03/22/18, patient with no overt CLL (0.32% by MRD flow testing from [facility]). Do not plan to give 6th cycle of obi (unless counts recover in a reasonable timeframe).      04/09/2018 Adverse Reaction     ANC down to 0.2. Held venetoclax (was previously at 200 mg daily).        B-cell chronic lymphocytic leukemia (CMS-HCC)    09/05/2017 - 10/11/2017 Chemotherapy     Chemotherapy Treatment    Treatment Goal Control   Line of Treatment [No plan line of treatment]   Plan Name OP RITUXIMAB    Start Date 09/14/2017   End Date 10/05/2017   Provider Pernell Dupre, MD   Chemotherapy riTUXimab (RITUXAN) 712.5 mg in sodium chloride (NS) 0.9 % 500 mL IVPB, 375 mg/m2 = 712.5 mg, Intravenous, Once, 1 of 1 cycle  Administration: 712.5 mg (09/14/2017)  riTUXimab (RITUXAN) 712.5 mg in sodium chloride (NS) 0.9 % 250 mL rapid infusion, 375 mg/m2 = 712.5 mg, Intravenous, Once, 1 of 1 cycle  Administration: 712.5 mg (09/21/2017), 712.5 mg (09/28/2017), 712.5 mg (10/05/2017)         10/16/2017 Initial Diagnosis     Chronic lymphocytic leukemia, Rai stage IV (CMS-HCC)      10/23/2017 -  Chemotherapy     OP OBINUTUZUMAB AND VENETOCLAX  chlorambucil 0.5 mg/kg PO on days 1, 15, obinutuzumab 100 mg IV on day 1, obinutuzumab 900 mg IV on day 2, obinutuzumab 1,000 mg IV on days 8,15 on Cycle 1, then obinutuzumab 1,000 mg IV on day 1 for sequent cycles, every 28 days.         Allergies   Allergen Reactions   ??? Baclofen  made me go crazy         Current Outpatient Medications   Medication Sig Dispense Refill   ??? eltrombopag (PROMACTA) 25 MG tablet Take 2 tablets (50 mg total) by mouth daily. Administer on an empty stomach, 1 hour before or 2 hours after a meal. 60 tablet 2   ??? fluticasone propion-salmeterol (ADVAIR) 100-50 mcg/dose diskus Inhale 2 puffs daily.      ??? loperamide (IMODIUM A-D) 2 mg tablet Take 2 mg by mouth Three (3) times a day as needed for diarrhea.     ??? LORazepam (ATIVAN) 0.5 MG tablet Take 0.5 mg by mouth two (2) times a day as needed.      ??? mirtazapine (REMERON) 7.5 MG tablet Take 7.5 mg by mouth nightly.     ??? multivitamin (TAB-A-VITE/THERAGRAN) per tablet Take 1 tablet by mouth daily.      ??? oxyCODONE (ROXICODONE) 5 MG immediate release tablet Take 1 tablet (5 mg total) by mouth every eight (8) hours as needed for pain. 20 tablet 0   ??? pantoprazole (PROTONIX) 40 MG tablet Take by mouth daily.      ??? pravastatin (PRAVACHOL) 80 MG tablet TAKE 1 TABLET BY MOUTH EVERY NIGHT AT BEDTIME     ??? sertraline (ZOLOFT) 50 MG tablet Take 50 mg by mouth daily.  1   ??? topiramate (TOPAMAX) 50 MG tablet Take 50 mg by mouth daily.  3     No current facility-administered medications for this visit.      Physical exam:  Vitals:    05/28/18 0908   BP: 146/65   Pulse: 75   Resp: 18   Temp: 37 ??C (98.6 ??F)   SpO2: 98%     Constitutional: Resting, in no apparent distress  Eyes:  No scleral icterus or conjunctival injection.  Ear/nose/mouth/throat: Oral mucosa without ulceration, erythema or exudate.   Hematology/lymphatic/immunologic:  No lymphadenopathy in the anterior/posterior cervical, supraclavicular basins.  Cardiovascular:  RRR.  S1, S2.  No murmurs, gallops or rubs. Appear well-perfused.  No clubbing or cyanosis. 1+ edema to bilateral ankles and feet. Respiratory:  Breathing is unlabored, and patient is speaking full sentences with ease.  No stridor.  CTAB. No rales, ronchi or crackles.     GI:  No distention. Bowel sounds are present and normal in quality.  Mild pain on palpation of right upper quadrant. No palpable hepatomegaly or splenomegaly.  No palpable masses.   Musculoskeletal: . No pain on palpation of the spinous processes of the cervical, thoracic or lumbar vertebral bodies. No grossly-evident joint effusions or deformities.  Range of motion about the shoulder, elbow, hips and knees is grossly normal.    Skin:  No rashes or areas of skin breakdown.  Warm to touch, dry, smooth and even. Scattered bruises, small.  Neurologic:  Gait is normal.    Psychiatric:  Alert and oriented to person, place, time and situation.  Range of affect is appropriate.      ECOG Performance Status: 1    Results:  Labs and pathology have been reviewed and pertinent results are as follows:    Results for orders placed or performed in visit on 05/28/18   Comprehensive Metabolic Panel   Result Value Ref Range    Sodium 139 135 - 145 mmol/L    Potassium 3.8 3.5 - 5.0 mmol/L    Chloride 111 (H) 98 - 107 mmol/L    Anion Gap 7 7 - 15 mmol/L    CO2 21.0 (  L) 22.0 - 30.0 mmol/L    BUN 17 7 - 21 mg/dL    Creatinine 1.61 0.96 - 1.00 mg/dL    BUN/Creatinine Ratio 21     EGFR CKD-EPI Non-African American, Female 68 >=60 mL/min/1.77m2    EGFR CKD-EPI African American, Female 3 >=60 mL/min/1.80m2    Glucose 107 70 - 179 mg/dL    Calcium 9.1 8.5 - 04.5 mg/dL    Albumin 3.7 3.5 - 5.0 g/dL    Total Protein 6.3 (L) 6.5 - 8.3 g/dL    Total Bilirubin 0.5 0.0 - 1.2 mg/dL    AST 21 14 - 38 U/L    ALT 13 <35 U/L    Alkaline Phosphatase 80 38 - 126 U/L   Uric acid   Result Value Ref Range    Uric Acid 6.3 3.0 - 6.5 mg/dL   Phosphorus Level   Result Value Ref Range    Phosphorus 4.4 2.9 - 4.7 mg/dL   CBC w/ Differential   Result Value Ref Range    WBC 2.9 (L) 4.5 - 11.0 10*9/L    RBC 4.16 4.00 - 5.20 10*12/L    HGB 12.4 12.0 - 16.0 g/dL    HCT 40.9 81.1 - 91.4 %    MCV 88.1 80.0 - 100.0 fL    MCH 29.9 26.0 - 34.0 pg    MCHC 33.9 31.0 - 37.0 g/dL    RDW 78.2 (H) 95.6 - 15.0 %    MPV 11.3 (H) 7.0 - 10.0 fL    Platelet 35 (L) 150 - 440 10*9/L    Variable HGB Concentration Slight (A) Not Present    Neutrophils % 63.0 %    Lymphocytes % 23.1 %    Monocytes % 7.2 %    Eosinophils % 4.0 %    Basophils % 0.4 %    Absolute Neutrophils 1.8 (L) 2.0 - 7.5 10*9/L    Absolute Lymphocytes 0.7 (L) 1.5 - 5.0 10*9/L    Absolute Monocytes 0.2 0.2 - 0.8 10*9/L    Absolute Eosinophils 0.1 0.0 - 0.4 10*9/L    Absolute Basophils 0.0 0.0 - 0.1 10*9/L    Large Unstained Cells 2 0 - 4 %    Anisocytosis Slight (A) Not Present    Hypochromasia Slight (A) Not Present

## 2018-05-28 NOTE — Unmapped (Signed)
Labs drawn and sent for analysis.  Care provided by  H Birkhead, RN

## 2018-05-28 NOTE — Unmapped (Addendum)
Please return in 1 week for labs and 2 weeks for a follow up visit and labs.    For your arthritis, Tylenol is your best pill option (avoid meds like ibuprofen / aleve / naproxen etc because these can impair the way your platelets work and increase risk for bleeding). CBD ointment or cream would be okay to try but I would avoid taking as a pill or capsule because it could interact with the drug that is helping your platelets.    Your bone marrow showed very very little CLL which is good news. Also, with the tests that we have so far, there is no evidence that anything else bad is going on -  So the most likely cause for your low blood counts is that it was a side effect of the chemo. We will keep you OFF of the venetoclax drug for now (might restart at a lower dose, but we would wait for your platelets to be much better - 100 or more).    Please call us if you experience:    1. Nausea or vomiting not controlled by nausea medicines  2. Fever of 100.5 F or higher, shaking chills, drenching night sweats.  3. Uncontrolled pain  4. Any rapidly enlarging lymph node or mass  5. Unintentional weight loss  6. Any other concerning symptom     MyChart Messages  For your safety and best care, please DO NOT use MyChart messages to report symptoms. (Symptoms should be reported by calling the nurse triage line). Please use MyChart for non-urgent matters such as general questions, non-urgent prescription refills, or non-urgent scheduling issues.     ?? Please do not use MyChart for URGENT messages, as messages are only checked during regular business hours.     ?? Please note that MyChart messages may be routed a central pool and one of your provider???s team members will get back to you.  - Expect up to 3 business days for response     If you have any other questions, please do not hesitate to contact us.    Nurse Navigator: Milinda Antis, RN      For health related questions Monday through Friday 8 AM??? 5 PM : please call the office at [phone] and ask to speak with a nurse.  For appointment changes call: Main Clinic [phone].  Toll free number is [phone].    On Nights, Weekends and Holidays:  Call [phone] and ask for the adult hematologist/oncologist on call.      [facility]  [address]  [place], [place] [address]  [email or web address]    Results for orders placed or performed in visit on 05/28/18   Comprehensive Metabolic Panel   Result Value Ref Range    Sodium 139 135 - 145 mmol/L    Potassium 3.8 3.5 - 5.0 mmol/L    Chloride 111 (H) 98 - 107 mmol/L    Anion Gap 7 7 - 15 mmol/L    CO2 21.0 (L) 22.0 - 30.0 mmol/L    BUN 17 7 - 21 mg/dL    Creatinine 2.44 0.10 - 1.00 mg/dL    BUN/Creatinine Ratio 21     EGFR CKD-EPI Non-African American, Female 60 >=60 mL/min/1.14m2    EGFR CKD-EPI African American, Female 74 >=60 mL/min/1.80m2    Glucose 107 70 - 179 mg/dL    Calcium 9.1 8.5 - 27.2 mg/dL    Albumin 3.7 3.5 - 5.0 g/dL    Total Protein 6.3 (L) 6.5 -  8.3 g/dL    Total Bilirubin 0.5 0.0 - 1.2 mg/dL    AST 21 14 - 38 U/L    ALT 13 <35 U/L    Alkaline Phosphatase 80 38 - 126 U/L   Uric acid   Result Value Ref Range    Uric Acid 6.3 3.0 - 6.5 mg/dL   Phosphorus Level   Result Value Ref Range    Phosphorus 4.4 2.9 - 4.7 mg/dL   CBC w/ Differential   Result Value Ref Range    WBC 2.9 (L) 4.5 - 11.0 10*9/L    RBC 4.16 4.00 - 5.20 10*12/L    HGB 12.4 12.0 - 16.0 g/dL    HCT 45.4 09.8 - 11.9 %    MCV 88.1 80.0 - 100.0 fL    MCH 29.9 26.0 - 34.0 pg    MCHC 33.9 31.0 - 37.0 g/dL    RDW 14.7 (H) 82.9 - 15.0 %    MPV 11.3 (H) 7.0 - 10.0 fL    Platelet 35 (L) 150 - 440 10*9/L    Variable HGB Concentration Slight (A) Not Present    Neutrophils % 63.0 %    Lymphocytes % 23.1 %    Monocytes % 7.2 %    Eosinophils % 4.0 %    Basophils % 0.4 %    Absolute Neutrophils 1.8 (L) 2.0 - 7.5 10*9/L    Absolute Lymphocytes 0.7 (L) 1.5 - 5.0 10*9/L    Absolute Monocytes 0.2 0.2 - 0.8 10*9/L    Absolute Eosinophils 0.1 0.0 - 0.4 10*9/L Absolute Basophils 0.0 0.0 - 0.1 10*9/L    Large Unstained Cells 2 0 - 4 %    Anisocytosis Slight (A) Not Present    Hypochromasia Slight (A) Not Present

## 2018-05-30 ENCOUNTER — Ambulatory Visit
Admission: RE | Admit: 2018-05-30 | Discharge: 2018-05-30 | Disposition: A | Discharge status: Home / Self Care | Payer: Medicare HMO | Source: Ambulatory Visit | Attending: Internal Medicine | Admitting: Internal Medicine

## 2018-05-30 DIAGNOSIS — Z1231 Encounter for screening mammogram for malignant neoplasm of breast: Secondary | ICD-10-CM | POA: Diagnosis not present

## 2018-05-30 HISTORY — DX: Personal history of antineoplastic chemotherapy: Z92.21

## 2018-06-04 ENCOUNTER — Ambulatory Visit: Admit: 2018-06-04 | Discharge: 2018-06-04 | Payer: MEDICARE

## 2018-06-04 DIAGNOSIS — C9110 Chronic lymphocytic leukemia of B-cell type not having achieved remission: Principal | ICD-10-CM

## 2018-06-04 LAB — CBC W/ AUTO DIFF
BASOPHILS RELATIVE PERCENT: 0.4 %
EOSINOPHILS ABSOLUTE COUNT: 0.1 10*9/L (ref 0.0–0.4)
EOSINOPHILS RELATIVE PERCENT: 3.6 %
HEMATOCRIT: 36.1 % (ref 36.0–46.0)
HEMOGLOBIN: 12.4 g/dL — ABNORMAL LOW (ref 13.5–16.0)
LARGE UNSTAINED CELLS: 3 % (ref 0–4)
LYMPHOCYTES ABSOLUTE COUNT: 1 10*9/L — ABNORMAL LOW (ref 1.5–5.0)
LYMPHOCYTES RELATIVE PERCENT: 28.2 %
MEAN CORPUSCULAR HEMOGLOBIN CONC: 34.5 g/dL (ref 31.0–37.0)
MEAN CORPUSCULAR HEMOGLOBIN: 30 pg (ref 26.0–34.0)
MEAN CORPUSCULAR VOLUME: 87 fL (ref 80.0–100.0)
MEAN PLATELET VOLUME: 11 fL — ABNORMAL HIGH (ref 7.0–10.0)
MONOCYTES ABSOLUTE COUNT: 0.3 10*9/L (ref 0.2–0.8)
MONOCYTES RELATIVE PERCENT: 9.6 %
NEUTROPHILS ABSOLUTE COUNT: 1.9 10*9/L — ABNORMAL LOW (ref 2.0–7.5)
NEUTROPHILS RELATIVE PERCENT: 55.6 %
PLATELET COUNT: 26 10*9/L — ABNORMAL LOW (ref 150–440)
RED BLOOD CELL COUNT: 4.15 10*12/L (ref 4.00–5.20)
RED CELL DISTRIBUTION WIDTH: 16.3 % — ABNORMAL HIGH (ref 12.0–15.0)

## 2018-06-04 LAB — HEMATOCRIT: Lab: 36.1

## 2018-06-04 MED ORDER — ELTROMBOPAG 25 MG TABLET
ORAL_TABLET | Freq: Every day | ORAL | 2 refills | 0 days | Status: CP
Start: 2018-06-04 — End: 2018-06-24

## 2018-06-04 NOTE — Unmapped (Signed)
Hi,     Patient contacted the Communication Center requesting to speak with the care team of Katrina Gould to discuss:    Returning missed call to nurse triage.    Please contact [name] at [phone].    Program: Heme Malignancy  Speciality: Medical Oncology    Check Indicates criteria has been reviewed and confirmed with the patient:    [x]  Preferred Name   [x]  DOB and/or MR#  [x]  Preferred Contact Method  [x]  Phone Number(s)   []  MyChart     Thank you,   Jannette Spanner  [facility] Communication [facility]   [phone]

## 2018-06-04 NOTE — Unmapped (Signed)
Returned call to Ms. Coburn to convey lab results per Dr. Lonni Fix. Pt expressed concern about eye irritation / blood shot eyes onset two weeks ago, with accompanying slightly blurred vision. States degree of redness and irritation fluctuates in severity. Advised pt per Dr. Lonni Fix to please check in with her PCP regarding eye irritation. Pt verbalized understanding and expressed appreciation for the call.

## 2018-06-04 NOTE — Unmapped (Signed)
Pt arrived to infusion in NAD. Port accessed. Labs collected and sent. ANC 1.9. No granix or transfusion needed today. Port deaccessed. Pt given AVS and discharged in NAD.

## 2018-06-04 NOTE — Unmapped (Signed)
If you feel like this is an emergency please call 911.  For appointments or questions Monday through Friday 8AM-5PM please call [phone] or Toll Free [phone]. For Medical questions or concerns ask for the Nurse Triage Line.  On Nights, Weekends, and Holidays call [phone] and ask for the Oncologist on Call.  Reasons to call the Nurse Triage Line:  Fever of 100.5 or greater  Nausea and/or vomiting not relieved with nausea medicine  Diarrhea or constipation  Severe pain not relieved with usual pain regimen  Shortness of breath  Uncontrolled bleeding  Mental status changes    Infusion on 06/04/2018   Component Date Value Ref Range Status   ??? WBC 06/04/2018 3.4* 4.5 - 11.0 10*9/L Final   ??? RBC 06/04/2018 4.15  4.00 - 5.20 10*12/L Final   ??? HGB 06/04/2018 12.4* 13.5 - 16.0 g/dL Final   ??? HCT 57/84/6962 36.1  36.0 - 46.0 % Final   ??? MCV 06/04/2018 87.0  80.0 - 100.0 fL Final   ??? MCH 06/04/2018 30.0  26.0 - 34.0 pg Final   ??? MCHC 06/04/2018 34.5  31.0 - 37.0 g/dL Final   ??? RDW 95/28/4132 16.3* 12.0 - 15.0 % Final   ??? MPV 06/04/2018 11.0* 7.0 - 10.0 fL Final   ??? Platelet 06/04/2018 26* 150 - 440 10*9/L Final   ??? Variable HGB Concentration 06/04/2018 Slight* Not Present Final   ??? Neutrophils % 06/04/2018 55.6  % Final   ??? Lymphocytes % 06/04/2018 28.2  % Final   ??? Monocytes % 06/04/2018 9.6  % Final   ??? Eosinophils % 06/04/2018 3.6  % Final   ??? Basophils % 06/04/2018 0.4  % Final   ??? Absolute Neutrophils 06/04/2018 1.9* 2.0 - 7.5 10*9/L Final   ??? Absolute Lymphocytes 06/04/2018 1.0* 1.5 - 5.0 10*9/L Final   ??? Absolute Monocytes 06/04/2018 0.3  0.2 - 0.8 10*9/L Final   ??? Absolute Eosinophils 06/04/2018 0.1  0.0 - 0.4 10*9/L Final   ??? Absolute Basophils 06/04/2018 0.0  0.0 - 0.1 10*9/L Final   ??? Large Unstained Cells 06/04/2018 3  0 - 4 % Final   ??? Anisocytosis 06/04/2018 Slight* Not Present Final

## 2018-06-06 NOTE — Unmapped (Signed)
Hi,    Patient Katrina Gould called requesting a medication refill for the following:    ? Medication: Promacta  ? Dosage:25 mg  ? Days left of medication:   ? Pharmacy:     Program: Heme Malignancy  Speciality: Medical Oncology     The expected turnaround time is 3-4 business days       Check Indicates criteria has been reviewed and confirmed with the patient:    []  Preferred Name   [x]  DOB and/or MR#  [x]  Preferred Contact Method  [x]  Phone Number(s)   [x]  Preferred Pharmacy   [x]  MyChart     Thank you,  Christell Faith  [facility]  [phone]

## 2018-06-07 NOTE — Unmapped (Addendum)
L/M for Katrina Gould regarding her request for Promacta refill assistance. Let her know she needed to call the Specialty Pharmacy and request delivery. Will continue to f/u.    Made contact with Katrina Gould regarding her refill, gave her the Specialty Pharmacy phone number and told her to call right away. Pt verbalized understanding. She states she has no medication left as of today. She expressed appreciation for the call.

## 2018-06-07 NOTE — Unmapped (Signed)
Returned pt's call regarding delivery of her specialty medication, to be delivered tomorrow 2/8. Provided emotional support to pt in that she did well to call pharmacy as instructed. Asked pt to let us know if her medication does not arrive tomorrow. Pt expressed appreciation for the call.

## 2018-06-07 NOTE — Unmapped (Signed)
Hi,     Patient contacted the Communication Center requesting to speak with the care team of Katrina Gould to discuss:    Contacted pharmacy as instructed by her nurse, pharmacy is unable to ship medication today and she does not have any medication left. Pharmacy stated that they can try to get meds out to the patient tomorrow.     Please contact Katrina Gould at [phone].    Program: Heme Malignancy  Speciality: Medical Oncology    Check Indicates criteria has been reviewed and confirmed with the patient:    [x]  Preferred Name   [x]  DOB and/or MR#  [x]  Preferred Contact Method  [x]  Phone Number(s)   []  MyChart     Thank you,   Jannette Spanner  [facility]   [phone]

## 2018-06-08 NOTE — Unmapped (Signed)
I called to make sure she had Promacta refill - was out today but notes will have refill shipped to her tomorrow    I reviewed that we changed her prescription to 75 mg daily given that she's not had much improvement (at all) in her platelets. She confirmed understanding.

## 2018-06-10 NOTE — Unmapped (Signed)
[facility] Follow Up       Patient Name: Katrina Gould  Patient Age: 78 y.o.  Encounter Date: 06/11/2018      Chief complaint/Reason for visit: CLL/SLL    Assessment:  Katrina Gould is a 78 y.o. female who presents for follow of her CLL/SLL, Rai stage 4. Disease markers: 46,XX,der(4)t(4;4)(p14;q21). IGHV unmutated (0%, 3-33*01). B5m 5.05. No TP53 mutation. High-risk by CLL-IPI (6 points).    Katrina Gould continues to do fairly well.  I shared with her the good news that her neutrophils have now normalized. She has not needed granix since 1/3; this suggests improved marrow health. Her platelets remain stably low. I reviewed with her that it is early to expect any improvement from the recent promacta dose increase. She had some diarrhea since last seen, which does not seem associated with the promacta dose increase.     We will continue to follow her closely with return appointment in two weeks.    Plans and Recommendations:  1. CLL, Rai 4   - Dose reduce Venetoclax to 100 mg when platelets recover to >100 (still on hold currently)  - Continue eltrombopag 75 mg PO daily (increased on 06/08/18)  - Given normalization of ANC and stable platelet count, will not check interim labs at this point  - RTC in 2 weeks    2. Diarrhea: had intermittent bout responsive to imodium.  - Requested that patient notify us if diarrhea recurs; should r/o c. difficile    3. Arthritis: she feels she has had benefit from topical CBD oil  - May continue prn tylenol but should avoid NSAIDs    4. Health Maintenance: Up to date on 2019 influenza vaccine  - continue annual dermatology follow up  - 1st Shingrix vaccine was given 06/2017; patient will need second vaccine, but recommend that she get this done at a pharmacy or at her PCP's office for insurance purposes    Due to this patient's diagnosis, she is at significant risk for subsequent morbidity and/or mortality.    Dr. Lonni Fix was available.    Mariana Kaufman, AGPCNP-BC  Nurse Practitioner  Hematology/Oncology  [facility]  06/11/2018        Interval History:  Since last seen, Katrina Gould has mostly been well. She generally feels tired. I want to sleep all the time. That said, she is able to do her daily activities, including chores. This morning, she cooked breakfast for her granddaughter.  Her appetite is great. She has gained a few pounds.    Denies intercurrent infection. Denies fevers. Denies unexplained bleeding.    On Friday and Saturday, she had severe diarrhea. It just ran out of me. She was incontinent of stool. She took two imodium and it checked it. She thought it was coming back yesterday and took one imodium, which checked it.    She has a headache now, which is not new for her. Denies CP. Has been having DOE for the past year. Denies n/v. Her feet swell but get better overnight. Denies rash.    Otherwise, she denies new constitutional symptoms such as anorexia, weight loss, drenching night sweats or unexplained fevers.  Furthermore, she denies symptoms of marrow failure:  recurrent or unexplained intercurrent infections.  There have been no new or unexplained pains or self-identified masses, swelling or enlarged lymph nodes. Some bruising but no bleeding.    Past Medical, Surgical and Family History were reviewed and pertinent updates were made in the Electronic  Medical Record  ??  Review of Systems:  Other than as reported above in the interim history, the other systems reviewed were unremarkable.  ??  ECOG Performance Status: 1     Oncology History:    Oncology History    CLL    First seen in oncology clinic locally 03/31/16    Had a left axillary biopsy 03/15/16 that confirmed CLL/SLL: Flow showed monoclonal B-cell population with CD5, CD23, kappa light chain. IHC showed positive CD20 (diffuse), negative for cyclin D1.    No constitutional symptoms except some sweats    Labs: Hb 12.0, Hct 34.8, plt 147. WBC was 10.6 with ANC 2.5. ALC was 7.5K.    CLL FISH studies: normal panel. No cells with 11q, tri 12, 17p or 13q, and the 11;14 translocation also not detected.    Karyotype: 46,XX,der(4)t(4;4)(p14;q21)[3]/45,XX,dic(6;17)(q12;p11.2) (no loss of TP53 on FISH)    Molecular: No TP53 mutation    CLL-IPI: As per Lancet Oncol Vol 15 October 2014  Includes (1) TP53 status (no abnormalities vs. 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 MRD = 0.32%.            CLL (chronic lymphocytic leukemia) (CMS-HCC)    05/16/2016 Initial Diagnosis     CLL (chronic lymphocytic leukemia) (RAF-HCC)      09/14/2017 - 10/05/2017 Chemotherapy     Rituximab weekly x4  No response      10/23/2017 -  Chemotherapy     Began ven/obi      03/19/2018 Adverse Reaction     Obinutuzumab held given persistent thrombocytopenia. Marrow performed on 03/22/18, patient with no overt CLL (0.32% by MRD flow testing from [facility]). Do not plan to give 6th cycle of obi (unless counts recover in a reasonable timeframe).      04/09/2018 Adverse Reaction     ANC down to 0.2. Held venetoclax (was previously at 200 mg daily).        B-cell chronic lymphocytic leukemia (CMS-HCC)    09/05/2017 - 10/11/2017 Chemotherapy     Chemotherapy Treatment    Treatment Goal Control   Line of Treatment [No plan line of treatment]   Plan Name OP RITUXIMAB    Start Date 09/14/2017   End Date 10/05/2017   Provider Pernell Dupre, MD   Chemotherapy riTUXimab (RITUXAN) 712.5 mg in sodium chloride (NS) 0.9 % 500 mL IVPB, 375 mg/m2 = 712.5 mg, Intravenous, Once, 1 of 1 cycle  Administration: 712.5 mg (09/14/2017)  riTUXimab (RITUXAN) 712.5 mg in sodium chloride (NS) 0.9 % 250 mL rapid infusion, 375 mg/m2 = 712.5 mg, Intravenous, Once, 1 of 1 cycle  Administration:  712.5 mg (09/21/2017), 712.5 mg (09/28/2017), 712.5 mg (10/05/2017)         10/16/2017 Initial Diagnosis     Chronic lymphocytic leukemia, Rai stage IV (CMS-HCC)      10/23/2017 -  Chemotherapy     OP OBINUTUZUMAB AND VENETOCLAX  chlorambucil 0.5 mg/kg PO on days 1, 15, obinutuzumab 100 mg IV on day 1, obinutuzumab 900 mg IV on day 2, obinutuzumab 1,000 mg IV on days 8,15 on Cycle 1, then obinutuzumab 1,000 mg IV on day 1 for sequent cycles, every 28 days.         Allergies   Allergen Reactions   ??? Baclofen      made me go crazy         Current Outpatient Medications   Medication Sig Dispense Refill   ??? eltrombopag (PROMACTA) 25 MG tablet Take 3 tablets (75 mg total) by mouth daily. Administer on an empty stomach, 1 hour before or 2 hours after a meal. 90 tablet 2   ??? fluticasone propion-salmeterol (ADVAIR) 100-50 mcg/dose diskus Inhale 2 puffs daily.      ??? loperamide (IMODIUM A-D) 2 mg tablet Take 2 mg by mouth Three (3) times a day as needed for diarrhea.     ??? LORazepam (ATIVAN) 0.5 MG tablet Take 0.5 mg by mouth two (2) times a day as needed.      ??? mirtazapine (REMERON) 7.5 MG tablet Take 7.5 mg by mouth nightly.     ??? multivitamin (TAB-A-VITE/THERAGRAN) per tablet Take 1 tablet by mouth daily.      ??? oxyCODONE (ROXICODONE) 5 MG immediate release tablet Take 1 tablet (5 mg total) by mouth every eight (8) hours as needed for pain. 20 tablet 0   ??? pantoprazole (PROTONIX) 40 MG tablet Take by mouth daily.      ??? pravastatin (PRAVACHOL) 80 MG tablet TAKE 1 TABLET BY MOUTH EVERY NIGHT AT BEDTIME     ??? sertraline (ZOLOFT) 50 MG tablet Take 50 mg by mouth daily.  1   ??? topiramate (TOPAMAX) 50 MG tablet Take 50 mg by mouth daily.  3     No current facility-administered medications for this visit.      Facility-Administered Medications Ordered in Other Visits   Medication Dose Route Frequency Provider Last Rate Last Dose   ??? heparin, porcine (PF) 100 unit/mL injection 500 Units  500 Units Intravenous Q30 Min PRN Pernell Dupre, MD   500 Units at 06/11/18 1049     Physical exam:  Vitals:    06/11/18 1139   BP: 141/70   Pulse: 62   Resp: 18   Temp: 37 ??C (98.6 ??F)   SpO2: 98%     Constitutional: Resting, in no apparent distress  Eyes:  No scleral icterus or conjunctival injection.  Ear/nose/mouth/throat: Oral mucosa without ulceration, erythema or exudate.   Hematology/lymphatic/immunologic:  No lymphadenopathy in the anterior/posterior cervical, supraclavicular basins.  Cardiovascular:  RRR.  S1, S2.  No murmurs, gallops or rubs. Appear well-perfused.  No clubbing or cyanosis. 1+ edema to bilateral ankles and feet.  Respiratory:  Breathing is unlabored, and patient is speaking full sentences with ease.  No stridor.  CTAB. No rales, ronchi or crackles.     GI:  No distention. Bowel sounds are present and normal in quality.  Mild pain on palpation of right upper quadrant. No palpable hepatomegaly or splenomegaly.  No palpable masses.   Musculoskeletal: . No pain on palpation of the spinous processes of the  cervical, thoracic or lumbar vertebral bodies. No grossly-evident joint effusions or deformities.  Range of motion about the shoulder, elbow, hips and knees is grossly normal.    Skin:  No rashes or areas of skin breakdown.  Warm to touch, dry, smooth and even. Scattered bruises, small.  Neurologic:  Gait is normal.    Psychiatric:  Alert and oriented to person, place, time and situation.  Range of affect is appropriate.      ECOG Performance Status: 1    Results:  Labs and pathology have been reviewed and pertinent results are as follows:    Results for orders placed or performed in visit on 06/11/18   Comprehensive Metabolic Panel   Result Value Ref Range    Sodium 140 135 - 145 mmol/L    Potassium 4.3 3.5 - 5.0 mmol/L    Chloride 109 (H) 98 - 107 mmol/L    Anion Gap 9 7 - 15 mmol/L    CO2 22.0 22.0 - 30.0 mmol/L    BUN 19 7 - 21 mg/dL    Creatinine 1.61 0.96 - 1.00 mg/dL    BUN/Creatinine Ratio 22     EGFR CKD-EPI Non-African American, Female 65 >=60 mL/min/1.73m2    EGFR CKD-EPI African American, Female 75 >=60 mL/min/1.72m2    Glucose 97 70 - 179 mg/dL    Calcium 9.5 8.5 - 04.5 mg/dL    Albumin 3.8 3.5 - 5.0 g/dL    Total Protein 6.5 6.5 - 8.3 g/dL    Total Bilirubin 0.5 0.0 - 1.2 mg/dL    AST 22 14 - 38 U/L    ALT 15 <35 U/L    Alkaline Phosphatase 85 38 - 126 U/L   Uric acid   Result Value Ref Range    Uric Acid 5.7 3.0 - 6.5 mg/dL   Phosphorus Level   Result Value Ref Range    Phosphorus 4.1 2.9 - 4.7 mg/dL   CBC w/ Differential   Result Value Ref Range    WBC 4.3 (L) 4.5 - 11.0 10*9/L    RBC 4.24 4.00 - 5.20 10*12/L    HGB 12.6 12.0 - 16.0 g/dL    HCT 40.9 81.1 - 91.4 %    MCV 87.4 80.0 - 100.0 fL    MCH 29.7 26.0 - 34.0 pg    MCHC 34.0 31.0 - 37.0 g/dL    RDW 78.2 (H) 95.6 - 15.0 %    MPV 10.5 (H) 7.0 - 10.0 fL    Platelet 27 (L) 150 - 440 10*9/L    Neutrophils % 61.4 %    Lymphocytes % 26.7 %    Monocytes % 6.8 %    Eosinophils % 2.8 %    Basophils % 0.4 %    Absolute Neutrophils 2.7 2.0 - 7.5 10*9/L    Absolute Lymphocytes 1.2 (L) 1.5 - 5.0 10*9/L    Absolute Monocytes 0.3 0.2 - 0.8 10*9/L    Absolute Eosinophils 0.1 0.0 - 0.4 10*9/L    Absolute Basophils 0.0 0.0 - 0.1 10*9/L    Large Unstained Cells 2 0 - 4 %    Anisocytosis Slight (A) Not Present

## 2018-06-11 ENCOUNTER — Ambulatory Visit: Admit: 2018-06-11 | Discharge: 2018-06-11 | Payer: MEDICARE

## 2018-06-11 ENCOUNTER — Other Ambulatory Visit: Admit: 2018-06-11 | Discharge: 2018-06-11 | Payer: MEDICARE

## 2018-06-11 ENCOUNTER — Ambulatory Visit
Admit: 2018-06-11 | Discharge: 2018-06-11 | Payer: MEDICARE | Attending: Hematology & Oncology | Primary: Hematology & Oncology

## 2018-06-11 DIAGNOSIS — C9110 Chronic lymphocytic leukemia of B-cell type not having achieved remission: Secondary | ICD-10-CM

## 2018-06-11 LAB — COMPREHENSIVE METABOLIC PANEL
ALBUMIN: 3.8 g/dL (ref 3.5–5.0)
ALKALINE PHOSPHATASE: 85 U/L (ref 38–126)
ALT (SGPT): 15 U/L (ref <35)
ANION GAP: 9 mmol/L (ref 7–15)
AST (SGOT): 22 U/L (ref 14–38)
BILIRUBIN TOTAL: 0.5 mg/dL (ref 0.0–1.2)
BLOOD UREA NITROGEN: 19 mg/dL (ref 7–21)
BUN / CREAT RATIO: 22
CALCIUM: 9.5 mg/dL (ref 8.5–10.2)
CHLORIDE: 109 mmol/L — ABNORMAL HIGH (ref 98–107)
CO2: 22 mmol/L (ref 22.0–30.0)
CREATININE: 0.85 mg/dL (ref 0.60–1.00)
EGFR CKD-EPI AA FEMALE: 75 mL/min/{1.73_m2} (ref >=60)
EGFR CKD-EPI NON-AA FEMALE: 65 mL/min/{1.73_m2} (ref >=60)
GLUCOSE RANDOM: 97 mg/dL (ref 70–179)
POTASSIUM: 4.3 mmol/L (ref 3.5–5.0)
SODIUM: 140 mmol/L (ref 135–145)

## 2018-06-11 LAB — CBC W/ AUTO DIFF
BASOPHILS RELATIVE PERCENT: 0.4 %
EOSINOPHILS ABSOLUTE COUNT: 0.1 10*9/L (ref 0.0–0.4)
EOSINOPHILS RELATIVE PERCENT: 2.8 %
HEMATOCRIT: 37.1 % (ref 36.0–46.0)
HEMOGLOBIN: 12.6 g/dL (ref 12.0–16.0)
LYMPHOCYTES ABSOLUTE COUNT: 1.2 10*9/L — ABNORMAL LOW (ref 1.5–5.0)
LYMPHOCYTES RELATIVE PERCENT: 26.7 %
MEAN CORPUSCULAR HEMOGLOBIN CONC: 34 g/dL (ref 31.0–37.0)
MEAN CORPUSCULAR HEMOGLOBIN: 29.7 pg (ref 26.0–34.0)
MEAN CORPUSCULAR VOLUME: 87.4 fL (ref 80.0–100.0)
MONOCYTES ABSOLUTE COUNT: 0.3 10*9/L (ref 0.2–0.8)
MONOCYTES RELATIVE PERCENT: 6.8 %
NEUTROPHILS RELATIVE PERCENT: 61.4 %
PLATELET COUNT: 27 10*9/L — ABNORMAL LOW (ref 150–440)
RED BLOOD CELL COUNT: 4.24 10*12/L (ref 4.00–5.20)
RED CELL DISTRIBUTION WIDTH: 16.4 % — ABNORMAL HIGH (ref 12.0–15.0)
WBC ADJUSTED: 4.3 10*9/L — ABNORMAL LOW (ref 4.5–11.0)

## 2018-06-11 LAB — URIC ACID: Urate:MCnc:Pt:Ser/Plas:Qn:: 5.7

## 2018-06-11 LAB — MONOCYTES RELATIVE PERCENT: Lab: 6.8

## 2018-06-11 LAB — GLUCOSE RANDOM: Glucose:MCnc:Pt:Ser/Plas:Qn:: 97

## 2018-06-11 LAB — PHOSPHORUS: Phosphate:MCnc:Pt:Ser/Plas:Qn:: 4.1

## 2018-06-11 NOTE — Unmapped (Addendum)
While your platelets aren't yet improving, your neutrophils are back to normal! That's a great sign that your bone marrow is getting healthier.     It is early yet for the platelets to improve on the recently increased dose of promacta.    We'll see you back in two weeks.    Lab on 06/11/2018   Component Date Value Ref Range Status   ??? Uric Acid 06/11/2018 5.7  3.0 - 6.5 mg/dL Final   ??? Phosphorus 06/11/2018 4.1  2.9 - 4.7 mg/dL Final   ??? WBC 04/54/0981 4.3* 4.5 - 11.0 10*9/L Final   ??? RBC 06/11/2018 4.24  4.00 - 5.20 10*12/L Final   ??? HGB 06/11/2018 12.6  12.0 - 16.0 g/dL Final   ??? HCT 19/14/7829 37.1  36.0 - 46.0 % Final   ??? MCV 06/11/2018 87.4  80.0 - 100.0 fL Final   ??? MCH 06/11/2018 29.7  26.0 - 34.0 pg Final   ??? MCHC 06/11/2018 34.0  31.0 - 37.0 g/dL Final   ??? RDW 56/21/3086 16.4* 12.0 - 15.0 % Final   ??? MPV 06/11/2018 10.5* 7.0 - 10.0 fL Final   ??? Platelet 06/11/2018 27* 150 - 440 10*9/L Final   ??? Neutrophils % 06/11/2018 61.4  % Final   ??? Lymphocytes % 06/11/2018 26.7  % Final   ??? Monocytes % 06/11/2018 6.8  % Final   ??? Eosinophils % 06/11/2018 2.8  % Final   ??? Basophils % 06/11/2018 0.4  % Final   ??? Absolute Neutrophils 06/11/2018 2.7  2.0 - 7.5 10*9/L Final   ??? Absolute Lymphocytes 06/11/2018 1.2* 1.5 - 5.0 10*9/L Final   ??? Absolute Monocytes 06/11/2018 0.3  0.2 - 0.8 10*9/L Final   ??? Absolute Eosinophils 06/11/2018 0.1  0.0 - 0.4 10*9/L Final   ??? Absolute Basophils 06/11/2018 0.0  0.0 - 0.1 10*9/L Final   ??? Large Unstained Cells 06/11/2018 2  0 - 4 % Final   ??? Anisocytosis 06/11/2018 Slight* Not Present Final

## 2018-06-24 MED ORDER — ELTROMBOPAG 25 MG TABLET
ORAL_TABLET | Freq: Every day | ORAL | 2 refills | 0.00000 days | Status: CP
Start: 2018-06-24 — End: 2018-09-04

## 2018-06-24 NOTE — Unmapped (Signed)
Hi,     Leanord Hawking contacted the Communication Center requesting to speak with the care team of Katrina Gould to discuss:    Patient needs to refill Promacta prescription. She states that she moved from 2 tablets/day to 3 tablets/day and consequently is running out faster than originally anticipated. Pharmacy can not release Rx early without MD approval.    Please contact Ms. Andreoli at [phone].      Check Indicates criteria has been reviewed and confirmed with the patient:    [x]  Preferred Name   [x]  DOB and/or MR#  [x]  Preferred Contact Method  [x]  Phone Number(s)   [x]  MyChart     Thank you,   Kelli Hope  [facility]   [phone]

## 2018-06-25 NOTE — Unmapped (Signed)
AOC Triage Note     Patient: Katrina Gould     Reason for call:  pt returning call to triage    Time call returned: 1349     Phone Assessment: Pt concerned that the pharmacy could not locate a script that was sent yesterday by Kendal Hymen.  Clarification given and pt will call back if the script is still not located     Triage Recommendations: RN suggested that the patient call the pharmacy again to clarify it is a NEW script that the pharmacy is looking for as she previously believed that the old script had been modified.  She will call back if the pharmacy cannot locate the script.     Patient Response: Pt states understanding.     Outstanding tasks: Notify navigator Amy [employer] of conversation     Patient Pharmacy has been verified and primary pharmacy has been marked as preferred

## 2018-06-25 NOTE — Unmapped (Signed)
AOC Triage Note     Patient: Katrina Gould     Reason for call: Per Kendal Hymen,  Called pt to notify that her new script with correct dosage has been received by the pharmacy.  The pt will need to call to arrange delivery as we cannot do that for her.     Time call returned: 1605     Phone Assessment: Pt driving at this time, but states understanding that she will need to schedule delivery     Triage Recommendations: Prescription was verified as received by Kendal Hymen, pt to call the pharmacy to arrange delivery.     Patient Response: Pt appreciative of response and verification.  She states understanding that new script with proper dosage is awaiting her call to schedule delivery     Outstanding tasks: notified Kendal Hymen of completion of call and pt response     Patient Pharmacy has been verified and primary pharmacy has been marked as preferred

## 2018-06-25 NOTE — Unmapped (Signed)
Hi,     Patient contacted the Communication Center requesting to speak with the care team of Katrina Gould to discuss:    Returning call to nurse triage.    Please contact [name] at [phone].    Program: Heme Malignancy  Speciality: Medical Oncology    Check Indicates criteria has been reviewed and confirmed with the patient:    [x]  Preferred Name   [x]  DOB and/or MR#  [x]  Preferred Contact Method  [x]  Phone Number(s)   []  MyChart     Thank you,   Jannette Spanner  [facility]   [phone]

## 2018-06-26 ENCOUNTER — Ambulatory Visit
Admit: 2018-06-26 | Discharge: 2018-06-26 | Payer: MEDICARE | Attending: Hematology & Oncology | Primary: Hematology & Oncology

## 2018-06-26 ENCOUNTER — Other Ambulatory Visit: Admit: 2018-06-26 | Discharge: 2018-06-26 | Payer: MEDICARE

## 2018-06-26 DIAGNOSIS — C9110 Chronic lymphocytic leukemia of B-cell type not having achieved remission: Principal | ICD-10-CM

## 2018-06-26 LAB — CBC W/ AUTO DIFF
BASOPHILS ABSOLUTE COUNT: 0 10*9/L (ref 0.0–0.1)
BASOPHILS RELATIVE PERCENT: 0.4 %
EOSINOPHILS ABSOLUTE COUNT: 0.1 10*9/L (ref 0.0–0.4)
EOSINOPHILS RELATIVE PERCENT: 3 %
HEMATOCRIT: 37.2 % (ref 36.0–46.0)
HEMOGLOBIN: 12.6 g/dL (ref 12.0–16.0)
LARGE UNSTAINED CELLS: 2 % (ref 0–4)
LYMPHOCYTES ABSOLUTE COUNT: 1.1 10*9/L — ABNORMAL LOW (ref 1.5–5.0)
LYMPHOCYTES RELATIVE PERCENT: 30.9 %
MEAN CORPUSCULAR HEMOGLOBIN CONC: 33.9 g/dL (ref 31.0–37.0)
MEAN CORPUSCULAR HEMOGLOBIN: 29.6 pg (ref 26.0–34.0)
MEAN CORPUSCULAR VOLUME: 87.2 fL (ref 80.0–100.0)
MEAN PLATELET VOLUME: 11.8 fL — ABNORMAL HIGH (ref 7.0–10.0)
MONOCYTES ABSOLUTE COUNT: 0.3 10*9/L (ref 0.2–0.8)
MONOCYTES RELATIVE PERCENT: 9.5 %
NEUTROPHILS ABSOLUTE COUNT: 1.9 10*9/L — ABNORMAL LOW (ref 2.0–7.5)
PLATELET COUNT: 25 10*9/L — ABNORMAL LOW (ref 150–440)
WBC ADJUSTED: 3.5 10*9/L — ABNORMAL LOW (ref 4.5–11.0)

## 2018-06-26 LAB — COMPREHENSIVE METABOLIC PANEL
ALBUMIN: 3.8 g/dL (ref 3.5–5.0)
ALKALINE PHOSPHATASE: 83 U/L (ref 38–126)
ALT (SGPT): 17 U/L (ref <35)
ANION GAP: 12 mmol/L (ref 7–15)
AST (SGOT): 26 U/L (ref 14–38)
BILIRUBIN TOTAL: 0.7 mg/dL (ref 0.0–1.2)
BLOOD UREA NITROGEN: 18 mg/dL (ref 7–21)
BUN / CREAT RATIO: 24
CALCIUM: 9 mg/dL (ref 8.5–10.2)
CO2: 23 mmol/L (ref 22.0–30.0)
CREATININE: 0.76 mg/dL (ref 0.60–1.00)
EGFR CKD-EPI AA FEMALE: 86 mL/min/{1.73_m2} (ref >=60)
EGFR CKD-EPI NON-AA FEMALE: 75 mL/min/{1.73_m2} (ref >=60)
GLUCOSE RANDOM: 95 mg/dL (ref 70–179)
POTASSIUM: 4 mmol/L (ref 3.5–5.0)
PROTEIN TOTAL: 6.5 g/dL (ref 6.5–8.3)
SODIUM: 140 mmol/L (ref 135–145)

## 2018-06-26 LAB — PHOSPHORUS: Phosphate:MCnc:Pt:Ser/Plas:Qn:: 5 — ABNORMAL HIGH

## 2018-06-26 LAB — GLUCOSE RANDOM: Glucose:MCnc:Pt:Ser/Plas:Qn:: 95

## 2018-06-26 LAB — NEUTROPHILS RELATIVE PERCENT: Lab: 53.9

## 2018-06-26 LAB — URIC ACID: Urate:MCnc:Pt:Ser/Plas:Qn:: 5.7

## 2018-06-26 NOTE — Unmapped (Signed)
[facility] Follow Up       Patient Name: Katrina Gould  Patient Age: 78 y.o.  Encounter Date: 06/26/2018      Chief complaint/Reason for visit: CLL/SLL    Assessment:  Katrina Gould is a 78 y.o. female who presents for follow of her CLL/SLL, Rai stage 4. Disease markers: 46,XX,der(4)t(4;4)(p14;q21). IGHV unmutated (0%, 3-33*01). B3m 5.05. No TP53 mutation. High-risk by CLL-IPI (6 points).    Katrina Gould continues to do fairly well. ANC is 1.9 today, slightly lower than prior but still well above needed granix. She has not needed granix since 1/3; this suggests improved marrow health. Her platelets remain stably low but not requiring transfusions. I reviewed with her that it is early to expect any improvement from the recent promacta dose increase though it is somewhat perplexing why we haven't seen any improvement yet.    We will do a CT c/a/p to eval for residual LAD / splenomegaly, the latter could explain thrombocytopenia.     We will continue to follow her closely with return appointment in two weeks. Will consider repeating a BM bx if the low plts persist in coming months.    Plans and Recommendations:  1. CLL, Rai 4   - Consider resuming Venetoclax at 100 mg when platelets recover to >100 (still on hold currently due to plt <<100)  - Continue eltrombopag 75 mg PO daily (dose was increased on 06/08/18)  - Given normalization of ANC and stable platelet count, will not check interim labs at this point  - RTC in 2 weeks    2. Arthritis: she feels she has had benefit from topical CBD oil  - May continue prn tylenol but should avoid NSAIDs    3. Health Maintenance: Up to date on 2019 influenza vaccine  - continue annual dermatology follow up  - 1st Shingrix vaccine was given 06/2017; patient will need second vaccine, but previously recommended that she get this done at a pharmacy or at her PCP's office for insurance purposes    Due to this patient's diagnosis, she is at significant risk for subsequent morbidity and/or mortality.      06/26/2018        Interval History:  Since last seen, Katrina Gould notes she's been doing well - feeling good, noting shoulder pain much improved with the CBD topical med. Has some mucus but no fevers.    Otherwise, she denies new constitutional symptoms such as anorexia, weight loss, drenching night sweats or unexplained fevers.  Furthermore, she denies symptoms of marrow failure:  recurrent or unexplained intercurrent infections.  There have been no new or unexplained pains or self-identified masses, swelling or enlarged lymph nodes. Some bruising but no bleeding.    Past Medical, Surgical and Family History were reviewed and pertinent updates were made in the Electronic Medical Record  ??  Review of Systems:  Other than as reported above in the interim history, the other systems reviewed were unremarkable.  ??  ECOG Performance Status: 1     Oncology History:    Oncology History    CLL    First seen in oncology clinic locally 03/31/16    Had a left axillary biopsy 03/15/16 that confirmed CLL/SLL: Flow showed monoclonal B-cell population with CD5, CD23, kappa light chain. IHC showed positive CD20 (diffuse), negative for cyclin D1.    No constitutional symptoms except some sweats    Labs: Hb 12.0, Hct 34.8, plt 147. WBC was 10.6 with ANC 2.5.  ALC was 7.5K.    CLL FISH studies: normal panel. No cells with 11q, tri 12, 17p or 13q, and the 11;14 translocation also not detected.    Karyotype: 46,XX,der(4)t(4;4)(p14;q21)[3]/45,XX,dic(6;17)(q12;p11.2) (no loss of TP53 on FISH)    Molecular: No TP53 mutation    CLL-IPI: As per Lancet Oncol Vol 15 October 2014  Includes (1) TP53 status (no abnormalities vs. 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 MRD = 0.32%.            CLL (chronic lymphocytic leukemia) (CMS-HCC)    05/16/2016 Initial Diagnosis     CLL (chronic lymphocytic leukemia) (RAF-HCC)      09/14/2017 - 10/05/2017 Chemotherapy     Rituximab weekly x4  No response      10/23/2017 -  Chemotherapy     Began ven/obi      03/19/2018 Adverse Reaction     Obinutuzumab held given persistent thrombocytopenia. Marrow performed on 03/22/18, patient with no overt CLL (0.32% by MRD flow testing from [facility]). Do not plan to give 6th cycle of obi (unless counts recover in a reasonable timeframe).      04/09/2018 Adverse Reaction     ANC down to 0.2. Held venetoclax (was previously at 200 mg daily).        B-cell chronic lymphocytic leukemia (CMS-HCC)    09/05/2017 - 10/11/2017 Chemotherapy     Chemotherapy Treatment    Treatment Goal Control   Line of Treatment [No plan line of treatment]   Plan Name OP RITUXIMAB    Start Date 09/14/2017   End Date 10/05/2017   Provider Pernell Dupre, MD   Chemotherapy riTUXimab (RITUXAN) 712.5 mg in sodium chloride (NS) 0.9 % 500 mL IVPB, 375 mg/m2 = 712.5 mg, Intravenous, Once, 1 of 1 cycle  Administration: 712.5 mg (09/14/2017)  riTUXimab (RITUXAN) 712.5 mg in sodium chloride (NS) 0.9 % 250 mL rapid infusion, 375 mg/m2 = 712.5 mg, Intravenous, Once, 1 of 1 cycle  Administration: 712.5 mg (09/21/2017), 712.5 mg (09/28/2017), 712.5 mg (10/05/2017)         10/16/2017 Initial Diagnosis     Chronic lymphocytic leukemia, Rai stage IV (CMS-HCC)      10/23/2017 -  Chemotherapy     OP OBINUTUZUMAB AND VENETOCLAX  chlorambucil 0.5 mg/kg PO on days 1, 15, obinutuzumab 100 mg IV on day 1, obinutuzumab 900 mg IV on day 2, obinutuzumab 1,000 mg IV on days 8,15 on Cycle 1, then obinutuzumab 1,000 mg IV on day 1 for sequent cycles, every 28 days.         Allergies   Allergen Reactions   ??? Baclofen  made me go crazy         Current Outpatient Medications   Medication Sig Dispense Refill   ??? eltrombopag (PROMACTA) 25 MG tablet Take 3 tablets (75 mg total) by mouth daily. Administer on an empty stomach, 1 hour before or 2 hours after a meal. 90 tablet 2   ??? fluticasone propion-salmeterol (ADVAIR) 100-50 mcg/dose diskus Inhale 2 puffs daily.      ??? loperamide (IMODIUM A-D) 2 mg tablet Take 2 mg by mouth Three (3) times a day as needed for diarrhea.     ??? LORazepam (ATIVAN) 0.5 MG tablet Take 0.5 mg by mouth two (2) times a day as needed.      ??? mirtazapine (REMERON) 7.5 MG tablet Take 7.5 mg by mouth nightly.     ??? multivitamin (TAB-A-VITE/THERAGRAN) per tablet Take 1 tablet by mouth daily.      ??? oxyCODONE (ROXICODONE) 5 MG immediate release tablet Take 1 tablet (5 mg total) by mouth every eight (8) hours as needed for pain. 20 tablet 0   ??? pantoprazole (PROTONIX) 40 MG tablet Take by mouth daily.      ??? pravastatin (PRAVACHOL) 80 MG tablet TAKE 1 TABLET BY MOUTH EVERY NIGHT AT BEDTIME     ??? sertraline (ZOLOFT) 50 MG tablet Take 50 mg by mouth daily.  1   ??? topiramate (TOPAMAX) 50 MG tablet Take 50 mg by mouth daily.  3     No current facility-administered medications for this visit.      Facility-Administered Medications Ordered in Other Visits   Medication Dose Route Frequency Provider Last Rate Last Dose   ??? heparin, porcine (PF) 100 unit/mL injection 500 Units  500 Units Intravenous Q30 Min PRN Pernell Dupre, MD         Physical exam:  Vitals:    06/26/18 1440   BP: 145/77   Pulse: 72   Resp: 16   Temp: 36.8 ??C (98.2 ??F)   SpO2: 96%     Constitutional: Resting, in no apparent distress  Eyes:  No scleral icterus or conjunctival injection.  Ear/nose/mouth/throat: Oral mucosa without ulceration, erythema or exudate.   Hematology/lymphatic/immunologic:  No lymphadenopathy in the anterior/posterior cervical, supraclavicular basins.  Cardiovascular:  RRR.  S1, S2.  No murmurs, gallops or rubs. Appear well-perfused.  No clubbing or cyanosis. 1+ edema to bilateral ankles and feet.  Respiratory:  Breathing is unlabored, and patient is speaking full sentences with ease.  No stridor.  CTAB. No rales, ronchi or crackles.     GI:  No distention. Bowel sounds are present and normal in quality.  Mild pain on palpation of right upper quadrant. No palpable hepatomegaly or splenomegaly.  No palpable masses.   Musculoskeletal: . No pain on palpation of the spinous processes of the cervical, thoracic or lumbar vertebral bodies. No grossly-evident joint effusions or deformities.  Range of motion about the shoulder, elbow, hips and knees is grossly normal.    Skin:  No rashes or areas of skin breakdown.  Warm to touch, dry, smooth and even. Scattered bruises, small.  Neurologic:  Gait is normal.    Psychiatric:  Alert and oriented to person, place, time and situation.  Range of affect is appropriate.      ECOG Performance Status: 1    Results:  Labs and pathology have been reviewed and pertinent results are as follows:  Results for orders placed or performed in visit on 06/26/18   CBC w/ Differential   Result  Value Ref Range    WBC 3.5 (L) 4.5 - 11.0 10*9/L    RBC 4.27 4.00 - 5.20 10*12/L    HGB 12.6 12.0 - 16.0 g/dL    HCT 32.4 40.1 - 02.7 %    MCV 87.2 80.0 - 100.0 fL    MCH 29.6 26.0 - 34.0 pg    MCHC 33.9 31.0 - 37.0 g/dL    RDW 25.3 (H) 66.4 - 15.0 %    MPV 11.8 (H) 7.0 - 10.0 fL    Platelet 25 (L) 150 - 440 10*9/L    Neutrophils % 53.9 %    Lymphocytes % 30.9 %    Monocytes % 9.5 %    Eosinophils % 3.0 %    Basophils % 0.4 %    Absolute Neutrophils 1.9 (L) 2.0 - 7.5 10*9/L    Absolute Lymphocytes 1.1 (L) 1.5 - 5.0 10*9/L Absolute Monocytes 0.3 0.2 - 0.8 10*9/L    Absolute Eosinophils 0.1 0.0 - 0.4 10*9/L    Absolute Basophils 0.0 0.0 - 0.1 10*9/L    Large Unstained Cells 2 0 - 4 %    Anisocytosis Slight (A) Not Present

## 2018-06-26 NOTE — Unmapped (Signed)
She is complaining of a headache, sinus pain, congestion and sore throat

## 2018-06-26 NOTE — Unmapped (Addendum)
Please return in 2 weeks for a follow up visit and labs.    Have your dentist call if any questions about what procedures you need. If procedures are high bleeding risk, it may be reasonable to do a platelet transfusion prior to the procedure.    We discussed that your platelets are pretty similar to before - they are at 25 today. Luckily they are not so low that we need to do a transfusion, but I still hope that they get better in the months to come on the eltrombopag medication.    Please call us if you experience:    1. Nausea or vomiting not controlled by nausea medicines  2. Fever of 100.5 F or higher, shaking chills, drenching night sweats.  3. Uncontrolled pain  4. Any rapidly enlarging lymph node or mass  5. Unintentional weight loss  6. Any other concerning symptom     MyChart Messages  For your safety and best care, please DO NOT use MyChart messages to report symptoms. (Symptoms should be reported by calling the nurse triage line). Please use MyChart for non-urgent matters such as general questions, non-urgent prescription refills, or non-urgent scheduling issues.     ?? Please do not use MyChart for URGENT messages, as messages are only checked during regular business hours.     ?? Please note that MyChart messages may be routed a central pool and one of your provider???s team members will get back to you.  - Expect up to 3 business days for response     If you have any other questions, please do not hesitate to contact us.    Nurse Navigator: Milinda Antis, RN      For health related questions Monday through Friday 8 AM??? 5 PM : please call the office at [phone] and ask to speak with a nurse.  For appointment changes call: Main Clinic [phone].  Toll free number is [phone].    On Nights, Weekends and Holidays:  Call [phone] and ask for the adult hematologist/oncologist on call.      [facility]  [address]  [place], [place] [address]  [email or web address]    Results for orders placed or performed in visit on 06/26/18   CBC w/ Differential   Result Value Ref Range    WBC 3.5 (L) 4.5 - 11.0 10*9/L    RBC 4.27 4.00 - 5.20 10*12/L    HGB 12.6 12.0 - 16.0 g/dL    HCT 02.7 25.3 - 66.4 %    MCV 87.2 80.0 - 100.0 fL    MCH 29.6 26.0 - 34.0 pg    MCHC 33.9 31.0 - 37.0 g/dL    RDW 40.3 (H) 47.4 - 15.0 %    MPV 11.8 (H) 7.0 - 10.0 fL    Platelet 25 (L) 150 - 440 10*9/L    Neutrophils % 53.9 %    Lymphocytes % 30.9 %    Monocytes % 9.5 %    Eosinophils % 3.0 %    Basophils % 0.4 %    Absolute Neutrophils 1.9 (L) 2.0 - 7.5 10*9/L    Absolute Lymphocytes 1.1 (L) 1.5 - 5.0 10*9/L    Absolute Monocytes 0.3 0.2 - 0.8 10*9/L    Absolute Eosinophils 0.1 0.0 - 0.4 10*9/L    Absolute Basophils 0.0 0.0 - 0.1 10*9/L    Large Unstained Cells 2 0 - 4 %    Anisocytosis Slight (A) Not Present

## 2018-06-27 NOTE — Unmapped (Signed)
Ewing Schlein contacted the [employer] requesting to speak with the care team of [name] to discuss:    Patient was returning missed phone call.    Please contact [name] at [phone].    Program: Heme Malignancy  Speciality: Medical Oncology    Check Indicates criteria has been reviewed and confirmed with the patient:    [x]  Preferred Name   [x]  DOB and/or MR#  [x]  Preferred Contact Method  [x]  Phone Number(s)   []  MyChart     Thank you,   [name]  [facility]   [phone]

## 2018-07-08 ENCOUNTER — Ambulatory Visit: Admit: 2018-07-08 | Discharge: 2018-07-09 | Payer: MEDICARE

## 2018-07-08 DIAGNOSIS — K5730 Diverticulosis of large intestine without perforation or abscess without bleeding: Principal | ICD-10-CM

## 2018-07-08 DIAGNOSIS — C9110 Chronic lymphocytic leukemia of B-cell type not having achieved remission: Principal | ICD-10-CM

## 2018-07-08 DIAGNOSIS — R599 Enlarged lymph nodes, unspecified: Principal | ICD-10-CM

## 2018-07-08 DIAGNOSIS — Z90710 Acquired absence of both cervix and uterus: Principal | ICD-10-CM

## 2018-07-08 DIAGNOSIS — R161 Splenomegaly, not elsewhere classified: Principal | ICD-10-CM

## 2018-07-08 DIAGNOSIS — I700 Atherosclerosis of aorta: Principal | ICD-10-CM

## 2018-07-08 DIAGNOSIS — I2510 Atherosclerotic heart disease of native coronary artery without angina pectoris: Principal | ICD-10-CM

## 2018-07-08 DIAGNOSIS — K449 Diaphragmatic hernia without obstruction or gangrene: Principal | ICD-10-CM

## 2018-07-09 ENCOUNTER — Other Ambulatory Visit: Admit: 2018-07-09 | Discharge: 2018-07-10 | Payer: MEDICARE

## 2018-07-09 ENCOUNTER — Ambulatory Visit
Admit: 2018-07-09 | Discharge: 2018-07-10 | Payer: MEDICARE | Attending: Hematology & Oncology | Primary: Hematology & Oncology

## 2018-07-09 DIAGNOSIS — G8929 Other chronic pain: Principal | ICD-10-CM

## 2018-07-09 DIAGNOSIS — M25511 Pain in right shoulder: Secondary | ICD-10-CM

## 2018-07-09 DIAGNOSIS — M1990 Unspecified osteoarthritis, unspecified site: Principal | ICD-10-CM

## 2018-07-09 DIAGNOSIS — C9110 Chronic lymphocytic leukemia of B-cell type not having achieved remission: Principal | ICD-10-CM

## 2018-07-09 DIAGNOSIS — C9590 Leukemia, unspecified not having achieved remission: Principal | ICD-10-CM

## 2018-07-09 DIAGNOSIS — C8590 Non-Hodgkin lymphoma, unspecified, unspecified site: Principal | ICD-10-CM

## 2018-07-09 LAB — CBC W/ AUTO DIFF
BASOPHILS ABSOLUTE COUNT: 0 10*9/L (ref 0.0–0.1)
BASOPHILS RELATIVE PERCENT: 0.5 %
EOSINOPHILS ABSOLUTE COUNT: 0.1 10*9/L (ref 0.0–0.4)
EOSINOPHILS ABSOLUTE COUNT: 0.1 10*9/L — AB (ref 0.0–0.4)
HEMATOCRIT: 37.6 % (ref 36.0–46.0)
HEMOGLOBIN: 12.7 g/dL (ref 12.0–16.0)
LARGE UNSTAINED CELLS: 2 % (ref 0–4)
LYMPHOCYTES ABSOLUTE COUNT: 0.9 10*9/L — ABNORMAL LOW (ref 1.5–5.0)
LYMPHOCYTES RELATIVE PERCENT: 33.2 %
MEAN CORPUSCULAR VOLUME: 87.2 fL (ref 80.0–100.0)
MEAN PLATELET VOLUME: 11.6 fL — ABNORMAL HIGH (ref 7.0–10.0)
MONOCYTES ABSOLUTE COUNT: 0.3 10*9/L (ref 0.2–0.8)
MONOCYTES RELATIVE PERCENT: 11.3 %
NEUTROPHILS ABSOLUTE COUNT: 1.3 10*9/L — ABNORMAL LOW (ref 2.0–7.5)
NEUTROPHILS RELATIVE PERCENT: 49 %
PLATELET COUNT: 40 10*9/L — ABNORMAL LOW (ref 150–440)
RED BLOOD CELL COUNT: 4.31 10*12/L (ref 4.00–5.20)
RED CELL DISTRIBUTION WIDTH: 16 % — ABNORMAL HIGH (ref 12.0–15.0)
WBC ADJUSTED: 2.7 10*9/L — ABNORMAL LOW (ref 4.5–11.0)

## 2018-07-09 LAB — COMPREHENSIVE METABOLIC PANEL
ALBUMIN: 3.6 g/dL (ref 3.5–5.0)
ALKALINE PHOSPHATASE: 82 U/L (ref 38–126)
ALT (SGPT): 16 U/L (ref <35)
ANION GAP: 11 mmol/L (ref 7–15)
AST (SGOT): 25 U/L (ref 14–38)
BILIRUBIN TOTAL: 0.7 mg/dL (ref 0.0–1.2)
BLOOD UREA NITROGEN: 15 mg/dL (ref 7–21)
BUN / CREAT RATIO: 21
CALCIUM: 8.9 mg/dL (ref 8.5–10.2)
CO2: 25 mmol/L (ref 22.0–30.0)
CREATININE: 0.72 mg/dL (ref 0.60–1.00)
EGFR CKD-EPI AA FEMALE: >90 mL/min/{1.73_m2} (ref >=60)
EGFR CKD-EPI NON-AA FEMALE: 80 mL/min/{1.73_m2} (ref >=60)
GLUCOSE RANDOM: 95 mg/dL (ref 70–179)
POTASSIUM: 4.1 mmol/L (ref 3.5–5.0)
PROTEIN TOTAL: 6.4 g/dL — ABNORMAL LOW (ref 6.5–8.3)
SODIUM: 142 mmol/L (ref 135–145)

## 2018-07-09 NOTE — Unmapped (Signed)
[facility] Follow Up       Patient Name: Katrina Gould  Patient Age: 78 y.o.  Encounter Date: 07/09/2018      Chief complaint/Reason for visit: CLL/SLL    Assessment:  Katrina Gould is a 78 y.o. female who presents for follow of her CLL/SLL, Rai stage 4. Disease markers: 46,XX,der(4)t(4;4)(p14;q21). IGHV unmutated (0%, 3-33*01). B22m 5.05. No TP53 mutation. High-risk by CLL-IPI (6 points).    Katrina Gould continues to do fairly well. ANC is 1.3 today, slightly lower than prior but still above needing granix. She has not needed granix since 1/3. Her platelets remain stably low but maybe a slight improvement today as they are 40.    CT c/a/p showed improvement in nodes and resolved splenomegaly which is reassuring. 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    We will continue to follow her closely with return appointment in two weeks. Will consider repeating a BM bx if the low plts persist in coming months.    Plans and Recommendations:  1. CLL, Rai 4   - Consider resuming Venetoclax at 100 mg when platelets recover to >100 (still on hold currently due to plt <<100)  - Continue eltrombopag 75 mg PO daily (dose was increased on 06/08/18)  - RTC in 2 weeks    2. Arthritis: she feels she has had benefit from topical CBD oil  - May continue prn tylenol but should avoid NSAIDs  - topical CBD is ok    3. Health Maintenance: Up to date on 2019 influenza vaccine  - continue annual dermatology follow up  - 1st Shingrix vaccine was given 06/2017; patient will need second vaccine, but previously recommended that she get this done at a pharmacy or at her PCP's office for insurance purposes     Due to this patient's diagnosis, she is at significant risk for subsequent morbidity and/or mortality.      07/09/2018        Interval History:  Since last seen, Katrina Gould notes she's been doing well - doing well except ongoing shoulder pain though thinks the CBD cream helping. Otherwise, she denies new constitutional symptoms such as anorexia, weight loss, drenching night sweats or unexplained fevers.  Furthermore, she denies symptoms of marrow failure:  recurrent or unexplained intercurrent infections.  There have been no new or unexplained pains or self-identified masses, swelling or enlarged lymph nodes. Some bruising but no bleeding.    Past Medical, Surgical and Family History were reviewed and pertinent updates were made in the Electronic Medical Record  ??  Review of Systems:  Other than as reported above in the interim history, the other systems reviewed were unremarkable.  ??  ECOG Performance Status: 1     Oncology History:    Oncology History    CLL    First seen in oncology clinic locally 03/31/16    Had a left axillary biopsy 03/15/16 that confirmed CLL/SLL: Flow showed monoclonal B-cell population with CD5, CD23, kappa light chain. IHC showed positive CD20 (diffuse), negative for cyclin D1.    No constitutional symptoms except some sweats    Labs: Hb 12.0, Hct 34.8, plt 147. WBC was 10.6 with ANC 2.5. ALC was 7.5K.    CLL FISH studies: normal panel. No cells with 11q, tri 12, 17p or 13q, and the 11;14 translocation also not detected.    Karyotype: 46,XX,der(4)t(4;4)(p14;q21)[3]/45,XX,dic(6;17)(q12;p11.2) (no loss of TP53 on FISH)    Molecular: No TP53  mutation    CLL-IPI: As per Lancet Oncol Vol 15 October 2014  Includes (1) TP53 status (no abnormalities vs. 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 MRD = 0.32%.    05/15/18: Repeated BM bx given persistent low counts - ~1% CLL in otherwise normal appearing marrow. Myeloid mutation panel negative. Karyotype: 45,XX,dic(6;17)(q12;p11.2)[2]/46,XX[18].          CLL (chronic lymphocytic leukemia) (CMS-HCC)    05/16/2016 Initial Diagnosis     CLL (chronic lymphocytic leukemia) (RAF-HCC)      09/14/2017 - 10/05/2017 Chemotherapy     Rituximab weekly x4  No response      10/23/2017 -  Chemotherapy     Began ven/obi      03/19/2018 Adverse Reaction     Obinutuzumab held given persistent thrombocytopenia. Marrow performed on 03/22/18, patient with no overt CLL (0.32% by MRD flow testing from [facility]). Do not plan to give 6th cycle of obi (unless counts recover in a reasonable timeframe).      04/09/2018 Adverse Reaction     ANC down to 0.2. Held venetoclax (was previously at 200 mg daily).        B-cell chronic lymphocytic leukemia (CMS-HCC)    09/05/2017 - 10/11/2017 Chemotherapy     Chemotherapy Treatment    Treatment Goal Control   Line of Treatment [No plan line of treatment]   Plan Name OP RITUXIMAB    Start Date 09/14/2017   End Date 10/05/2017   Provider Pernell Dupre, MD   Chemotherapy riTUXimab (RITUXAN) 712.5 mg in sodium chloride (NS) 0.9 % 500 mL IVPB, 375 mg/m2 = 712.5 mg, Intravenous, Once, 1 of 1 cycle  Administration: 712.5 mg (09/14/2017)  riTUXimab (RITUXAN) 712.5 mg in sodium chloride (NS) 0.9 % 250 mL rapid infusion, 375 mg/m2 = 712.5 mg, Intravenous, Once, 1 of 1 cycle  Administration: 712.5 mg (09/21/2017), 712.5 mg (09/28/2017), 712.5 mg (10/05/2017)         10/16/2017 Initial Diagnosis     Chronic lymphocytic leukemia, Rai stage IV (CMS-HCC)      10/23/2017 -  Chemotherapy     OP OBINUTUZUMAB AND VENETOCLAX  chlorambucil 0.5 mg/kg PO on days 1, 15, obinutuzumab 100 mg IV on day 1, obinutuzumab 900 mg IV on day 2, obinutuzumab 1,000 mg IV on days 8,15 on Cycle 1, then obinutuzumab 1,000 mg IV on day 1 for sequent cycles, every 28 days.         Allergies   Allergen Reactions   ??? Baclofen      made me go crazy         Current Outpatient Medications  Medication Sig Dispense Refill   ??? eltrombopag (PROMACTA) 25 MG tablet Take 3 tablets (75 mg total) by mouth daily. Administer on an empty stomach, 1 hour before or 2 hours after a meal. 90 tablet 2   ??? fluticasone propion-salmeterol (ADVAIR) 100-50 mcg/dose diskus Inhale 2 puffs daily.      ??? lidocaine-me.sal-menthol-camph (CBD-KINGS WITH LIDOCAINE) 4-9-1-1.2 % PtMd Apply 1 Dose topically Four (4) times a day.     ??? loperamide (IMODIUM A-D) 2 mg tablet Take 2 mg by mouth Three (3) times a day as needed for diarrhea.     ??? LORazepam (ATIVAN) 0.5 MG tablet Take 0.5 mg by mouth two (2) times a day as needed.      ??? mirtazapine (REMERON) 7.5 MG tablet Take 7.5 mg by mouth nightly.     ??? multivitamin (TAB-A-VITE/THERAGRAN) per tablet Take 1 tablet by mouth daily.      ??? oxyCODONE (ROXICODONE) 5 MG immediate release tablet Take 1 tablet (5 mg total) by mouth every eight (8) hours as needed for pain. 20 tablet 0   ??? pantoprazole (PROTONIX) 40 MG tablet Take by mouth daily.      ??? pravastatin (PRAVACHOL) 80 MG tablet TAKE 1 TABLET BY MOUTH EVERY NIGHT AT BEDTIME     ??? sertraline (ZOLOFT) 50 MG tablet Take 50 mg by mouth daily.  1   ??? topiramate (TOPAMAX) 50 MG tablet Take 50 mg by mouth daily.  3     No current facility-administered medications for this visit.      Facility-Administered Medications Ordered in Other Visits   Medication Dose Route Frequency Provider Last Rate Last Dose   ??? heparin, porcine (PF) 100 unit/mL injection 500 Units  500 Units Intravenous Q30 Min PRN Pernell Dupre, MD   500 Units at 07/09/18 1145     Physical exam:  Vitals:    07/09/18 1215   BP: 123/74   Pulse: 71   Resp: 20 Temp: 36.6 ??C (97.9 ??F)   SpO2: 95%     Constitutional: Resting, in no apparent distress  Eyes:  No scleral icterus or conjunctival injection.  Ear/nose/mouth/throat: Oral mucosa without ulceration, erythema or exudate.   Hematology/lymphatic/immunologic:  No lymphadenopathy in the anterior/posterior cervical, supraclavicular basins.  Cardiovascular:  RRR.  S1, S2.  No murmurs, gallops or rubs. Appear well-perfused.  No clubbing or cyanosis. 1+ edema to bilateral ankles and feet.  Respiratory:  Breathing is unlabored, and patient is speaking full sentences with ease.  No stridor.  CTAB. No rales, ronchi or crackles.     GI:  No distention. Bowel sounds are present and normal in quality.  Mild pain on palpation of right upper quadrant. No palpable hepatomegaly or splenomegaly.  No palpable masses.   Musculoskeletal: . No pain on palpation of the spinous processes of the cervical, thoracic or lumbar vertebral bodies. No grossly-evident joint effusions or deformities.  Range of motion about the shoulder, elbow, hips and knees is grossly normal.    Skin:  No rashes or areas of skin breakdown.  Warm to touch, dry, smooth and even. Scattered bruises, small.  Neurologic:  Gait is normal.    Psychiatric:  Alert and oriented to person, place, time and situation.  Range of affect is appropriate.      ECOG Performance Status: 1    Results:  Labs and pathology have been reviewed and pertinent results are as follows:  Results for orders placed or performed in visit on 07/09/18   CBC  w/ Differential   Result Value Ref Range    WBC 2.7 (L) 4.5 - 11.0 10*9/L    RBC 4.31 4.00 - 5.20 10*12/L    HGB 12.7 12.0 - 16.0 g/dL    HCT 16.1 09.6 - 04.5 %    MCV 87.2 80.0 - 100.0 fL    MCH 29.5 26.0 - 34.0 pg    MCHC 33.9 31.0 - 37.0 g/dL    RDW 40.9 (H) 81.1 - 15.0 %    MPV 11.6 (H) 7.0 - 10.0 fL    Platelet 40 (L) 150 - 440 10*9/L    Neutrophils % 49.0 %    Lymphocytes % 33.2 %    Monocytes % 11.3 %    Eosinophils % 3.6 %    Basophils % 0.5 %    Neutrophil Left Shift 1+ (A) Not Present    Absolute Neutrophils 1.3 (L) 2.0 - 7.5 10*9/L    Absolute Lymphocytes 0.9 (L) 1.5 - 5.0 10*9/L    Absolute Monocytes 0.3 0.2 - 0.8 10*9/L    Absolute Eosinophils 0.1 0.0 - 0.4 10*9/L    Absolute Basophils 0.0 0.0 - 0.1 10*9/L    Large Unstained Cells 2 0 - 4 %    Anisocytosis Slight (A) Not Present       CT a/p  IMPRESSION:  ??  -- Decreased size of lymph nodes along the left common iliac and right femoral arteries.  ??  -- Interval resolution of splenomegaly  ??  CT chest  IMPRESSION:  ??  No no new or enlarging lymphadenopathy.  ??  Coronary atherosclerosis. Large hiatal hernia.

## 2018-07-09 NOTE — Unmapped (Signed)
Labs drawn via POC, sent for analysis. POC flushed, hep locked, remains accessed. Care provided by Mickeal Needy, RN.

## 2018-07-09 NOTE — Unmapped (Signed)
Please return in 2 weeks for a follow up visit and labs.    Please call us if you experience:    1. Nausea or vomiting not controlled by nausea medicines  2. Fever of 100.5 F or higher, shaking chills, drenching night sweats.  3. Uncontrolled pain  4. Any rapidly enlarging lymph node or mass  5. Unintentional weight loss  6. Any other concerning symptom     MyChart Messages  For your safety and best care, please DO NOT use MyChart messages to report symptoms. (Symptoms should be reported by calling the nurse triage line). Please use MyChart for non-urgent matters such as general questions, non-urgent prescription refills, or non-urgent scheduling issues.     ?? Please do not use MyChart for URGENT messages, as messages are only checked during regular business hours.     ?? Please note that MyChart messages may be routed a central pool and one of your provider???s team members will get back to you.  - Expect up to 3 business days for response     If you have any other questions, please do not hesitate to contact us.    Nurse Navigator: Milinda Antis, RN      For health related questions Monday through Friday 8 AM??? 5 PM : please call the office at [phone] and ask to speak with a nurse.  For appointment changes call: Main Clinic [phone].  Toll free number is [phone].    On Nights, Weekends and Holidays:  Call [phone] and ask for the adult hematologist/oncologist on call.      [facility]  [address]  [place], [place] [address]  [email or web address]    Results for orders placed or performed in visit on 07/09/18   Comprehensive Metabolic Panel   Result Value Ref Range    Sodium 142 135 - 145 mmol/L    Potassium 4.1 3.5 - 5.0 mmol/L    Chloride 106 98 - 107 mmol/L    Anion Gap 11 7 - 15 mmol/L    CO2 25.0 22.0 - 30.0 mmol/L    BUN 15 7 - 21 mg/dL    Creatinine 2.44 0.10 - 1.00 mg/dL    BUN/Creatinine Ratio 21     EGFR CKD-EPI Non-African American, Female 80 >=60 mL/min/1.24m2    EGFR CKD-EPI African American, Female >90 >=60 mL/min/1.2m2    Glucose 95 70 - 179 mg/dL    Calcium 8.9 8.5 - 27.2 mg/dL    Albumin 3.6 3.5 - 5.0 g/dL    Total Protein 6.4 (L) 6.5 - 8.3 g/dL    Total Bilirubin 0.7 0.0 - 1.2 mg/dL    AST 25 14 - 38 U/L    ALT 16 <35 U/L    Alkaline Phosphatase 82 38 - 126 U/L   Type and Screen   Result Value Ref Range    ABO Grouping B NEG    CBC w/ Differential   Result Value Ref Range    WBC 2.7 (L) 4.5 - 11.0 10*9/L    RBC 4.31 4.00 - 5.20 10*12/L    HGB 12.7 12.0 - 16.0 g/dL    HCT 53.6 64.4 - 03.4 %    MCV 87.2 80.0 - 100.0 fL    MCH 29.5 26.0 - 34.0 pg    MCHC 33.9 31.0 - 37.0 g/dL    RDW 74.2 (H) 59.5 - 15.0 %    MPV 11.6 (H) 7.0 - 10.0 fL    Platelet 40 (L) 150 - 440  10*9/L    Neutrophils % 49.0 %    Lymphocytes % 33.2 %    Monocytes % 11.3 %    Eosinophils % 3.6 %    Basophils % 0.5 %    Neutrophil Left Shift 1+ (A) Not Present    Absolute Neutrophils 1.3 (L) 2.0 - 7.5 10*9/L    Absolute Lymphocytes 0.9 (L) 1.5 - 5.0 10*9/L    Absolute Monocytes 0.3 0.2 - 0.8 10*9/L    Absolute Eosinophils 0.1 0.0 - 0.4 10*9/L    Absolute Basophils 0.0 0.0 - 0.1 10*9/L    Large Unstained Cells 2 0 - 4 %    Anisocytosis Slight (A) Not Present

## 2018-07-17 DIAGNOSIS — C9110 Chronic lymphocytic leukemia of B-cell type not having achieved remission: Principal | ICD-10-CM

## 2018-07-23 ENCOUNTER — Ambulatory Visit: Admit: 2018-07-23 | Discharge: 2018-07-23 | Payer: MEDICARE

## 2018-07-23 ENCOUNTER — Ambulatory Visit
Admit: 2018-07-23 | Discharge: 2018-07-23 | Payer: MEDICARE | Attending: Hematology & Oncology | Primary: Hematology & Oncology

## 2018-07-23 DIAGNOSIS — C9110 Chronic lymphocytic leukemia of B-cell type not having achieved remission: Principal | ICD-10-CM

## 2018-07-23 LAB — CBC W/ AUTO DIFF
BASOPHILS ABSOLUTE COUNT: 0 10*9/L (ref 0.0–0.1)
BASOPHILS RELATIVE PERCENT: 0.8 %
EOSINOPHILS ABSOLUTE COUNT: 0.1 10*9/L (ref 0.0–0.4)
EOSINOPHILS RELATIVE PERCENT: 2.9 %
HEMATOCRIT: 37 % (ref 36.0–46.0)
HEMOGLOBIN: 12.8 g/dL — ABNORMAL LOW (ref 13.5–16.0)
LARGE UNSTAINED CELLS: 4 % (ref 0–4)
LYMPHOCYTES ABSOLUTE COUNT: 0.9 10*9/L — ABNORMAL LOW (ref 1.5–5.0)
LYMPHOCYTES RELATIVE PERCENT: 42.1 %
MEAN CORPUSCULAR HEMOGLOBIN CONC: 34.6 g/dL (ref 31.0–37.0)
MEAN PLATELET VOLUME: 9.3 fL (ref 7.0–10.0)
MONOCYTES ABSOLUTE COUNT: 0.3 10*9/L (ref 0.2–0.8)
MONOCYTES RELATIVE PERCENT: 12.3 %
NEUTROPHILS ABSOLUTE COUNT: 0.8 10*9/L — ABNORMAL LOW (ref 2.0–7.5)
NEUTROPHILS RELATIVE PERCENT: 38.2 %
PLATELET COUNT: 40 10*9/L — ABNORMAL LOW (ref 150–440)
PLATELET COUNT: 40 10*9/L — ABNORMAL LOW (ref 150–440)
RED BLOOD CELL COUNT: 4.26 10*12/L (ref 4.00–5.20)
RED CELL DISTRIBUTION WIDTH: 15.8 % — ABNORMAL HIGH (ref 12.0–15.0)

## 2018-07-23 LAB — COMPREHENSIVE METABOLIC PANEL
ALBUMIN: 3.9 g/dL (ref 3.5–5.0)
ALKALINE PHOSPHATASE: 80 U/L (ref 38–126)
ALT (SGPT): 16 U/L (ref <35)
BILIRUBIN TOTAL: 0.6 mg/dL (ref 0.0–1.2)
BLOOD UREA NITROGEN: 17 mg/dL (ref 7–21)
BUN / CREAT RATIO: 23
CALCIUM: 8.9 mg/dL (ref 8.5–10.2)
CHLORIDE: 106 mmol/L (ref 98–107)
CO2: 25 mmol/L (ref 22.0–30.0)
CREATININE: 0.73 mg/dL (ref 0.60–1.00)
EGFR CKD-EPI AA FEMALE: >90 mL/min/{1.73_m2} (ref >=60)
EGFR CKD-EPI NON-AA FEMALE: 79 mL/min/{1.73_m2} (ref >=60)
GLUCOSE RANDOM: 104 mg/dL (ref 65–179)
POTASSIUM: 4.4 mmol/L (ref 3.5–5.0)
PROTEIN TOTAL: 6.5 g/dL (ref 6.5–8.3)
SODIUM: 141 mmol/L (ref 135–145)

## 2018-07-23 LAB — SLIDE REVIEW

## 2018-07-23 NOTE — Unmapped (Signed)
Appointment on 07/23/2018   Component Date Value Ref Range Status   ??? Uric Acid 07/23/2018 5.2  3.0 - 6.5 mg/dL Final   ??? Phosphorus 07/23/2018 4.2  2.9 - 4.7 mg/dL Final   ??? Sodium 16/01/9603 141  135 - 145 mmol/L Final   ??? Potassium 07/23/2018 4.4  3.5 - 5.0 mmol/L Final   ??? Chloride 07/23/2018 106  98 - 107 mmol/L Final   ??? Anion Gap 07/23/2018 10  7 - 15 mmol/L Final   ??? CO2 07/23/2018 25.0  22.0 - 30.0 mmol/L Final   ??? BUN 07/23/2018 17  7 - 21 mg/dL Final   ??? Creatinine 07/23/2018 0.73  0.60 - 1.00 mg/dL Final   ??? BUN/Creatinine Ratio 07/23/2018 23   Final   ??? EGFR CKD-EPI Non-African American,* 07/23/2018 79  >=60 mL/min/1.59m2 Final   ??? EGFR CKD-EPI African American, Fem* 07/23/2018 >90  >=60 mL/min/1.75m2 Final   ??? Glucose 07/23/2018 104  65 - 179 mg/dL Final   ??? Calcium 54/12/8117 8.9  8.5 - 10.2 mg/dL Final   ??? Albumin 14/78/2956 3.9  3.5 - 5.0 g/dL Final   ??? Total Protein 07/23/2018 6.5  6.5 - 8.3 g/dL Final   ??? Total Bilirubin 07/23/2018 0.6  0.0 - 1.2 mg/dL Final   ??? AST 21/30/8657 24  14 - 38 U/L Final   ??? ALT 07/23/2018 16  <35 U/L Final   ??? Alkaline Phosphatase 07/23/2018 80  38 - 126 U/L Final   ??? WBC 07/23/2018 2.1* 4.5 - 11.0 10*9/L Final   ??? RBC 07/23/2018 4.26  4.00 - 5.20 10*12/L Final   ??? HGB 07/23/2018 12.8* 13.5 - 16.0 g/dL Final   ??? HCT 84/69/6295 37.0  36.0 - 46.0 % Final   ??? MCV 07/23/2018 86.6  80.0 - 100.0 fL Final   ??? MCH 07/23/2018 30.0  26.0 - 34.0 pg Final   ??? MCHC 07/23/2018 34.6  31.0 - 37.0 g/dL Final   ??? RDW 28/41/3244 15.8* 12.0 - 15.0 % Final   ??? MPV 07/23/2018 9.3  7.0 - 10.0 fL Final   ??? Platelet 07/23/2018 40* 150 - 440 10*9/L Final   ??? Neutrophils % 07/23/2018 38.2  % Final   ??? Lymphocytes % 07/23/2018 42.1  % Final   ??? Monocytes % 07/23/2018 12.3  % Final   ??? Eosinophils % 07/23/2018 2.9  % Final   ??? Basophils % 07/23/2018 0.8  % Final   ??? Neutrophil Left Shift 07/23/2018 2+* Not Present Final   ??? Absolute Neutrophils 07/23/2018 0.8* 2.0 - 7.5 10*9/L Final   ??? Absolute Lymphocytes 07/23/2018 0.9* 1.5 - 5.0 10*9/L Final   ??? Absolute Monocytes 07/23/2018 0.3  0.2 - 0.8 10*9/L Final   ??? Absolute Eosinophils 07/23/2018 0.1  0.0 - 0.4 10*9/L Final   ??? Absolute Basophils 07/23/2018 0.0  0.0 - 0.1 10*9/L Final   ??? Large Unstained Cells 07/23/2018 4  0 - 4 % Final   ??? Smear Review Comments 07/23/2018 See Comment* Undefined Final    Smear reviewed.   ??? Toxic Granulation 07/23/2018 Present* Not Present Final     If you feel like this is an emergency please call 911.  For appointments or questions Monday through Friday 8AM-5PM please call [phone] or Toll Free [phone]. For Medical questions or concerns ask for the Nurse Triage Line.  On Nights, Weekends, and Holidays call [phone] and ask for the Oncologist on Call.  Reasons to call the Nurse Triage Line:  Fever of 100.5 or greater  Nausea and/or vomiting not relived with nausea medicine  Diarrhea or constipation  Severe pain not relieved with usual pain regimen  Shortness of breath  Uncontrolled bleeding  Mental status changes

## 2018-07-23 NOTE — Unmapped (Signed)
1140: Patient present for possible transfusion, no acute concerns reported. Port accessed; labs drawn and sent for analysis. Patient comfortable with no requests at this time.    1220: Patient H/H 12.8/37 and plt 40; no transfusion needed per therapy plan. Line care performed and port de-accessed per protocol. AVS provided. Patient discharged home alert, oriented and in stable condition.

## 2018-07-23 NOTE — Unmapped (Addendum)
[facility] Telephone Visit    Patient Name: Katrina Gould  Patient Age: 78 y.o.  Encounter Date: 07/23/2018    Primary Care Provider:  Lauro Regulus    Referring Physician:  No ref. provider found    REASON FOR VISIT:   78 y.o. female referred by No ref. provider found in consultation for evaluation of CLL.    ASSESSMENT   Assessment:  Katrina Gould is a 78 y.o. female who presents for follow of her CLL/SLL, Rai stage 4. Disease markers: 46,XX,der(4)t(4;4)(p14;q21). IGHV unmutated (0%, 3-33*01). B25m 5.05. No TP53 mutation. High-risk by CLL-IPI (6 points).  ??  [name] continues to do fairly well. ANC is 0.8 today. She has not needed granix since 1/3 but will arrange for her to get given ANC<1 today. Her platelets remain stably low but maybe a slight improvement today as they are 40 (again).  ??  CT c/a/p showed improvement in nodes and resolved splenomegaly which is reassuring. 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  ??  We will continue to follow her closely with return appointment in two weeks. Will consider repeating a BM bx if the low plts persist in coming months.    PLAN   1. CLL, Rai 4   - Consider resuming Venetoclax at 100 mg when platelets recover to >100 (still on hold currently due to plt <<100)  - Continue eltrombopag 75 mg PO daily (dose was increased on 06/08/18)  - Arrange granix for ANC 0.8  - RTC in 4 weeks for labs and phone visit  ??  2. Arthritis: she feels she has had benefit from topical CBD oil  - May continue prn tylenol but should avoid NSAIDs  - topical CBD is ok  ??  3. Health Maintenance: Up to date on 2019 influenza vaccine  - continue annual dermatology follow up  - 1st Shingrix vaccine was given 06/2017; patient will need second vaccine, but previously recommended that she get this done at a pharmacy or at her PCP's office for insurance purposes    Salli Real, MD  Assistant Professor of Medicine    Total time spent on the phone was 5 minutes, >50% in counseling. Pt consented to phone visit.    INTERVAL HISTORY     Spoke by phone for follow-up.     Pt notes that she feels stable - low energy but mostly staying at home due to COVID, notes energy pretty similar overall though.    Appetite is good.    No fevers, chills. No cough.    HEMATOLOGICAL / ONCOLOGICAL HISTORY:     Oncology History    CLL    First seen in oncology clinic locally 03/31/16    Had a left axillary biopsy 03/15/16 that confirmed CLL/SLL: Flow showed monoclonal B-cell population with CD5, CD23, kappa light chain. IHC showed positive CD20 (diffuse), negative for cyclin D1.    No constitutional symptoms except some sweats    Labs: Hb 12.0, Hct 34.8, plt 147. WBC was 10.6 with ANC 2.5. ALC was 7.5K.    CLL FISH studies: normal panel. No cells with 11q, tri 12, 17p or 13q, and the 11;14 translocation also not detected.    Karyotype: 46,XX,der(4)t(4;4)(p14;q21)[3]/45,XX,dic(6;17)(q12;p11.2) (no loss of TP53 on FISH)    Molecular: No TP53 mutation    CLL-IPI: As per Lancet Oncol Vol 15 October 2014  Includes (1) TP53 status (no abnormalities vs. 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 MRD = 0.32%.    05/15/18: Repeated BM bx given persistent low counts - ~1% CLL in otherwise normal appearing marrow. Myeloid mutation panel negative. Karyotype: 45,XX,dic(6;17)(q12;p11.2)[2]/46,XX[18].          CLL (chronic lymphocytic leukemia) (CMS-HCC)    05/16/2016 Initial Diagnosis     CLL (chronic lymphocytic leukemia) (RAF-HCC)      09/14/2017 - 10/05/2017 Chemotherapy     Rituximab weekly x4  No response      10/23/2017 -  Chemotherapy     Began ven/obi      03/19/2018 Adverse Reaction     Obinutuzumab held given persistent thrombocytopenia. Marrow performed on 03/22/18, patient with no overt CLL (0.32% by MRD flow testing from [facility]). Do not plan to give 6th cycle of obi (unless counts recover in a reasonable timeframe).      04/09/2018 Adverse Reaction     ANC down to 0.2. Held venetoclax (was previously at 200 mg daily).        B-cell chronic lymphocytic leukemia (CMS-HCC)    09/05/2017 - 10/11/2017 Chemotherapy     Chemotherapy Treatment    Treatment Goal Control   Line of Treatment [No plan line of treatment]   Plan Name OP RITUXIMAB    Start Date 09/14/2017   End Date 10/05/2017   Provider Pernell Dupre, MD   Chemotherapy riTUXimab (RITUXAN) 712.5 mg in sodium chloride (NS) 0.9 % 500 mL IVPB, 375 mg/m2 = 712.5 mg, Intravenous, Once, 1 of 1 cycle  Administration: 712.5 mg (09/14/2017)  riTUXimab (RITUXAN) 712.5 mg in sodium chloride (NS) 0.9 % 250 mL rapid infusion, 375 mg/m2 = 712.5 mg, Intravenous, Once, 1 of 1 cycle  Administration: 712.5 mg (09/21/2017), 712.5 mg (09/28/2017), 712.5 mg (10/05/2017)         10/16/2017 Initial Diagnosis     Chronic lymphocytic leukemia, Rai stage IV (CMS-HCC)      10/23/2017 -  Chemotherapy     OP OBINUTUZUMAB AND VENETOCLAX  chlorambucil 0.5 mg/kg PO on days 1, 15, obinutuzumab 100 mg IV on day 1, obinutuzumab 900 mg IV on day 2, obinutuzumab 1,000 mg IV on days 8,15 on Cycle 1, then obinutuzumab 1,000 mg IV on day 1 for sequent cycles, every 28 days.         OTHER PAST MEDICAL HISTORY:     Patient Active Problem List   Diagnosis   ??? CLL (chronic lymphocytic leukemia) (CMS-HCC)   ??? Chronic migraine without aura   ??? 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 Left bundle branch block (LBBB)   ??? Hypercholesterolemia   ??? Chest pain, unspecified   ???  GERD (gastroesophageal reflux disease)   ??? Osteoporosis   ??? Needs flu shot   ??? BRBPR (bright red blood per rectum)   ??? Other autoimmune hemolytic anemias (CMS-HCC)    ??? B-cell chronic lymphocytic leukemia (CMS-HCC)   ??? Acute diarrhea   ??? Aortic atherosclerosis (CMS-HCC)   ??? Diverticulitis of colon   ??? Epigastric pain   ??? Gastritis, Helicobacter pylori   ??? H/O adenomatous polyp of colon   ??? Healthcare maintenance   ??? LLQ pain   ??? Lymphadenopathy   ??? Migraine without aura and without status migrainosus, not intractable   ??? Syncope and collapse   ??? White matter disease   ??? Vasovagal syncope   ??? Chemotherapy-induced neutropenia (CMS-HCC)   ??? Coccyx pain   ??? Chemotherapy induced diarrhea   ??? Chronic right shoulder pain   ??? Chemotherapy-induced thrombocytopenia       ALLERGIES:     Baclofen    MEDICATIONS:     Outpatient Encounter Medications as of 07/23/2018   Medication Sig Dispense Refill   ??? eltrombopag (PROMACTA) 25 MG tablet Take 3 tablets (75 mg total) by mouth daily. Administer on an empty stomach, 1 hour before or 2 hours after a meal. 90 tablet 2   ??? fluticasone propion-salmeterol (ADVAIR) 100-50 mcg/dose diskus Inhale 2 puffs daily.      ??? lidocaine-me.sal-menthol-camph (CBD-KINGS WITH LIDOCAINE) 4-9-1-1.2 % PtMd Apply 1 Dose topically Four (4) times a day.     ??? loperamide (IMODIUM A-D) 2 mg tablet Take 2 mg by mouth Three (3) times a day as needed for diarrhea.     ??? LORazepam (ATIVAN) 0.5 MG tablet Take 0.5 mg by mouth two (2) times a day as needed.      ??? mirtazapine (REMERON) 7.5 MG tablet Take 7.5 mg by mouth nightly.     ??? multivitamin (TAB-A-VITE/THERAGRAN) per tablet Take 1 tablet by mouth daily.      ??? oxyCODONE (ROXICODONE) 5 MG immediate release tablet Take 1 tablet (5 mg total) by mouth every eight (8) hours as needed for pain. 20 tablet 0   ??? pantoprazole (PROTONIX) 40 MG tablet Take by mouth daily.      ??? pravastatin (PRAVACHOL) 80 MG tablet TAKE 1 TABLET BY MOUTH EVERY NIGHT AT BEDTIME     ??? sertraline (ZOLOFT) 50 MG tablet Take 50 mg by mouth daily.  1   ??? topiramate (TOPAMAX) 50 MG tablet Take 50 mg by mouth daily.  3     Facility-Administered Encounter Medications as of 07/23/2018   Medication Dose Route Frequency Provider Last Rate Last Dose   ??? heparin, porcine (PF) 100 unit/mL injection 500 Units  500 Units Intravenous Q30 Min PRN Pernell Dupre, MD   500 Units at 07/23/18 1217       WBC   Date Value Ref Range Status   07/23/2018 2.1 (L) 4.5 - 11.0 10*9/L Final     Absolute Neutrophils   Date Value Ref Range Status   07/23/2018 0.8 (L) 2.0 - 7.5 10*9/L Final     Absolute Lymphocytes   Date Value Ref Range Status   07/23/2018 0.9 (L) 1.5 - 5.0 10*9/L Final     HGB   Date Value Ref Range Status   07/23/2018 12.8 (L) 13.5 - 16.0 g/dL Final     HCT   Date Value Ref Range Status   07/23/2018 37.0 36.0 - 46.0 % Final     MCV   Date Value Ref Range  Status   07/23/2018 86.6 80.0 - 100.0 fL Final     Platelet   Date Value Ref Range Status   07/23/2018 40 (L) 150 - 440 10*9/L Final     Sodium   Date Value Ref Range Status   07/23/2018 141 135 - 145 mmol/L Final     Potassium   Date Value Ref Range Status   07/23/2018 4.4 3.5 - 5.0 mmol/L Final     Chloride   Date Value Ref Range Status   07/23/2018 106 98 - 107 mmol/L Final     CO2   Date Value Ref Range Status   07/23/2018 25.0 22.0 - 30.0 mmol/L Final     BUN   Date Value Ref Range Status   07/23/2018 17 7 - 21 mg/dL Final     Creatinine   Date Value Ref Range Status   07/23/2018 0.73 0.60 - 1.00 mg/dL Final     Glucose   Date Value Ref Range Status   07/23/2018 104 65 - 179 mg/dL Final     Calcium   Date Value Ref Range Status   07/23/2018 8.9 8.5 - 10.2 mg/dL Final     Magnesium   Date Value Ref Range Status   01/18/2018 2.0 1.6 - 2.2 mg/dL Final Phosphorus   Date Value Ref Range Status   07/23/2018 4.2 2.9 - 4.7 mg/dL Final     Uric Acid   Date Value Ref Range Status   07/23/2018 5.2 3.0 - 6.5 mg/dL Final     Albumin   Date Value Ref Range Status   07/23/2018 3.9 3.5 - 5.0 g/dL Final     Total Protein   Date Value Ref Range Status   07/23/2018 6.5 6.5 - 8.3 g/dL Final     Total Bilirubin   Date Value Ref Range Status   07/23/2018 0.6 0.0 - 1.2 mg/dL Final     AST   Date Value Ref Range Status   07/23/2018 24 14 - 38 U/L Final     ALT   Date Value Ref Range Status   07/23/2018 16 <35 U/L Final     Alkaline Phosphatase   Date Value Ref Range Status   07/23/2018 80 38 - 126 U/L Final     LDH   Date Value Ref Range Status   02/14/2018 821 (H) 338 - 610 U/L Final     Total IgG   Date Value Ref Range Status   06/05/2017 1,121 600-1,700 mg/dL Final     Hep B S Ab   Date Value Ref Range Status   09/04/2017 Nonreactive Nonreactive, Grayzone Final     Comment:       Nonreactive and Grayzone results are considered non-immune.     Hep B Core Total Ab   Date Value Ref Range Status   09/04/2017 Nonreactive Nonreactive Final     Hepatitis C Ab   Date Value Ref Range Status   09/04/2017 Nonreactive Nonreactive Final     Comment:       Antibodies to HCV were not detected.  A nonreactive result does not exclude the possibility of exposure to HCV.

## 2018-07-29 NOTE — Unmapped (Signed)
[facility] Triage Note     Patient: Katrina Gould     Reason for call:  return call    Time call returned: 1335     Phone Assessment: pt concerned that she missed her granix injection last week.       Triage Recommendations: RN contacted Dr. Lonni Fix, pt last ANC on 3/24 was 0.8.  Dr Lonni Fix would like the pt to be scheduled this week for granix injection     Patient Response: Pt states understanding that the scheduler will be calling to schedule for this week     Outstanding tasks: route to Samella Parr for scheduling     Patient Pharmacy has been verified and primary pharmacy has been marked as preferred

## 2018-07-29 NOTE — Unmapped (Signed)
Hi,     Leanord Hawking contacted the Communication Center requesting to speak with the care team of [name] to discuss:    Patient missed Granix injection last week. She states she never received call with date and time, only that she spoke with nurse and was anticipating a call with instruction.    Please contact Ms. Sayres at [phone].      Check Indicates criteria has been reviewed and confirmed with the patient:    []  Preferred Name   []  DOB and/or MR#  []  Preferred Contact Method  []  Phone Number(s)   []  MyChart     Thank you,   Kelli Hope  [facility]   [phone]

## 2018-07-30 NOTE — Unmapped (Signed)
Patient scheduled for 4/8    Thanks

## 2018-07-31 ENCOUNTER — Institutional Professional Consult (permissible substitution): Admit: 2018-07-31 | Discharge: 2018-08-01 | Payer: MEDICARE

## 2018-07-31 DIAGNOSIS — C9110 Chronic lymphocytic leukemia of B-cell type not having achieved remission: Secondary | ICD-10-CM

## 2018-07-31 DIAGNOSIS — T451X5A Adverse effect of antineoplastic and immunosuppressive drugs, initial encounter: Secondary | ICD-10-CM

## 2018-07-31 DIAGNOSIS — D701 Agranulocytosis secondary to cancer chemotherapy: Principal | ICD-10-CM

## 2018-07-31 LAB — CBC W/ AUTO DIFF
BASOPHILS ABSOLUTE COUNT: 0 10*9/L (ref 0.0–0.1)
BASOPHILS RELATIVE PERCENT: 0.6 %
EOSINOPHILS ABSOLUTE COUNT: 0.1 10*9/L (ref 0.0–0.4)
EOSINOPHILS RELATIVE PERCENT: 3.8 %
HEMATOCRIT: 36.4 % (ref 36.0–46.0)
HEMOGLOBIN: 13 g/dL — ABNORMAL LOW (ref 13.5–16.0)
HEMOGLOBIN: 13 g/dL — ABNORMAL LOW (ref 13.5–16.0)
LYMPHOCYTES ABSOLUTE COUNT: 1 10*9/L — ABNORMAL LOW (ref 1.5–5.0)
LYMPHOCYTES RELATIVE PERCENT: 53.1 %
MEAN CORPUSCULAR HEMOGLOBIN CONC: 35.6 g/dL (ref 31.0–37.0)
MEAN CORPUSCULAR HEMOGLOBIN: 30.6 pg (ref 26.0–34.0)
MEAN CORPUSCULAR VOLUME: 85.9 fL (ref 80.0–100.0)
MEAN PLATELET VOLUME: 10.5 fL — ABNORMAL HIGH (ref 7.0–10.0)
MONOCYTES ABSOLUTE COUNT: 0.2 10*9/L (ref 0.2–0.8)
MONOCYTES RELATIVE PERCENT: 13.7 %
NEUTROPHILS ABSOLUTE COUNT: 0.4 10*9/L — CL (ref 2.0–7.5)
NEUTROPHILS RELATIVE PERCENT: 23.9 %
RED BLOOD CELL COUNT: 4.23 10*12/L (ref 4.00–5.20)
WBC ADJUSTED: 1.8 10*9/L — ABNORMAL LOW (ref 4.5–11.0)

## 2018-07-31 NOTE — Unmapped (Signed)
Pt arrived for lab draw and injection, injection completed and labs draw. AVS printed, pt d/c under self care

## 2018-07-31 NOTE — Unmapped (Signed)
No visits with results within 1 Day(s) from this visit.   Latest known visit with results is:   Appointment on 07/23/2018   Component Date Value Ref Range Status   ??? Uric Acid 07/23/2018 5.2  3.0 - 6.5 mg/dL Final   ??? Phosphorus 07/23/2018 4.2  2.9 - 4.7 mg/dL Final   ??? Sodium 16/01/9603 141  135 - 145 mmol/L Final   ??? Potassium 07/23/2018 4.4  3.5 - 5.0 mmol/L Final   ??? Chloride 07/23/2018 106  98 - 107 mmol/L Final   ??? Anion Gap 07/23/2018 10  7 - 15 mmol/L Final   ??? CO2 07/23/2018 25.0  22.0 - 30.0 mmol/L Final   ??? BUN 07/23/2018 17  7 - 21 mg/dL Final   ??? Creatinine 07/23/2018 0.73  0.60 - 1.00 mg/dL Final   ??? BUN/Creatinine Ratio 07/23/2018 23   Final   ??? EGFR CKD-EPI Non-African American,* 07/23/2018 79  >=60 mL/min/1.55m2 Final   ??? EGFR CKD-EPI African American, Fem* 07/23/2018 >90  >=60 mL/min/1.57m2 Final   ??? Glucose 07/23/2018 104  65 - 179 mg/dL Final   ??? Calcium 54/12/8117 8.9  8.5 - 10.2 mg/dL Final   ??? Albumin 14/78/2956 3.9  3.5 - 5.0 g/dL Final   ??? Total Protein 07/23/2018 6.5  6.5 - 8.3 g/dL Final   ??? Total Bilirubin 07/23/2018 0.6  0.0 - 1.2 mg/dL Final   ??? AST 21/30/8657 24  14 - 38 U/L Final   ??? ALT 07/23/2018 16  <35 U/L Final   ??? Alkaline Phosphatase 07/23/2018 80  38 - 126 U/L Final   ??? Blood Type 07/23/2018 B NEG   Final   ??? Antibody Screen 07/23/2018 NEG   Final   ??? WBC 07/23/2018 2.1* 4.5 - 11.0 10*9/L Final   ??? RBC 07/23/2018 4.26  4.00 - 5.20 10*12/L Final   ??? HGB 07/23/2018 12.8* 13.5 - 16.0 g/dL Final   ??? HCT 84/69/6295 37.0  36.0 - 46.0 % Final   ??? MCV 07/23/2018 86.6  80.0 - 100.0 fL Final   ??? MCH 07/23/2018 30.0  26.0 - 34.0 pg Final   ??? MCHC 07/23/2018 34.6  31.0 - 37.0 g/dL Final   ??? RDW 28/41/3244 15.8* 12.0 - 15.0 % Final   ??? MPV 07/23/2018 9.3  7.0 - 10.0 fL Final   ??? Platelet 07/23/2018 40* 150 - 440 10*9/L Final   ??? Neutrophils % 07/23/2018 38.2  % Final   ??? Lymphocytes % 07/23/2018 42.1  % Final   ??? Monocytes % 07/23/2018 12.3  % Final   ??? Eosinophils % 07/23/2018 2.9  % Final   ??? Basophils % 07/23/2018 0.8  % Final   ??? Neutrophil Left Shift 07/23/2018 2+* Not Present Final   ??? Absolute Neutrophils 07/23/2018 0.8* 2.0 - 7.5 10*9/L Final   ??? Absolute Lymphocytes 07/23/2018 0.9* 1.5 - 5.0 10*9/L Final   ??? Absolute Monocytes 07/23/2018 0.3  0.2 - 0.8 10*9/L Final   ??? Absolute Eosinophils 07/23/2018 0.1  0.0 - 0.4 10*9/L Final   ??? Absolute Basophils 07/23/2018 0.0  0.0 - 0.1 10*9/L Final   ??? Large Unstained Cells 07/23/2018 4  0 - 4 % Final   ??? Smear Review Comments 07/23/2018 See Comment* Undefined Final    Smear reviewed.   ??? Toxic Granulation 07/23/2018 Present* Not Present Final
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
## 2018-08-01 NOTE — Unmapped (Signed)
Hi,     Patient contacted the Communication Center requesting to speak with the care team of Katrina Gould to discuss:    Her labs going in the wrong direction. The patient would like to speak with the care team about her plan of care.    Please contact patient at [phone].    Check Indicates criteria has been reviewed and confirmed with the patient:    [x]  Preferred Name   [x]  DOB and/or MR#  [x]  Preferred Contact Method  [x]  Phone Number(s)   [x]  MyChart     Thank you,   Drema Balzarine  [facility]   [phone]

## 2018-08-02 NOTE — Unmapped (Signed)
Patient scheduled and confirmed appt date/time    Thanks
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
## 2018-08-06 NOTE — Unmapped (Signed)
[facility] Leukemia Clinic Telephone Visit    Patient Name: Katrina Gould  Patient Age: 78 y.o.  Encounter Date: 08/06/2018    Contact Information    Visit Conducted via   Person Contacted:   Patient location: Home  Contact Phone number: [phone] (home) [phone] (work)  Is there someone else in the room?     Primary Care Provider:  Lauro Regulus    Referring Physician:  No ref. provider found    REASON FOR VISIT:   78 y.o. female referred by No ref. provider found in consultation for evaluation of CLL.    ASSESSMENT/PLAN   Assessment:  Katrina Gould is a 78 y.o. female who presents for follow of her CLL/SLL, Rai stage 4. Disease markers: 46,XX,der(4)t(4;4)(p14;q21). IGHV unmutated (0%, 3-33*01). B15m 5.05. No TP53 mutation. High-risk by CLL-IPI (6 points).  ??  [name] continues to do fairly well. She had not needed granix since 1/3 but did require it again on 4/3 for ANC 0.4. Her platelets remain stably low but a bit improved and in 40K range lately.  ??  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  ??  We will continue to follow her closely with return appointment in two weeks. Will consider repeating a BM bx if the low plts persist in coming months.  ??  Plans and Recommendations:  1. CLL, Rai 4   - Consider resuming Venetoclax at 100 mg when platelets recover to >100 (still on hold currently due to plt <<100)  - Continue eltrombopag 75 mg PO daily (dose was increased on 06/08/18)  - RTC in 2 weeks  ??  2. Arthritis: she feels she has had benefit from topical CBD oil  - May continue prn tylenol but should avoid NSAIDs  - topical CBD is ok; I told her I do not recommend taking CBD oil due to medication interactions (I told her this is her choice but could be risky, she will avoid for now)  ??  3. Suspected Reflux   - told pt to avoid spicy/citrusy type foods which may aggravate this     4. Health Maintenance: Up to date on 2019 influenza vaccine  - continue annual dermatology follow up  - 1st Shingrix vaccine was given 06/2017; patient will need second vaccine, but previously recommended that she get this done at a pharmacy or at her PCP's office for insurance purposes    This patient visit was completed through the use of an audio/video or telephone encounter.      This patient encounter is appropriate and reasonable under the circumstances given the patient's particular presentation at this time. The patient has been advised of the potential risks and limitations of this mode of treatment (including, but not limited to, the absence of in-person examination) and has agreed to be treated in a remote fashion in spite of them. Any and all of the patient's/patient's family's questions on this issue have been answered.     The patient has also been advised to contact this office for worsening conditions or problems, and seek emergency medical treatment and/or call 911 if the patient deems either necessary.    Total time spent on the phone was 12 minutes, >50% in counseling.    Salli Real, MD  Assistant Professor of Medicine      INTERVAL HISTORY     Spoke by phone for follow-up.     She reports feeling better today than yesterday.  Yesterday she felt swimmy headed but is better today. Reports good po intake.    Wonders if her teeth could be leading to her low numbers. (I told her this is unlikely)     She does note an odor with one tooth - I told her okay to check in with dentist but to call us if needs an extraction (would likely need a plt transfusion to get>50K).    She had a back pain at night and wonders if this could be related to hiatal hernia. Pain radiated to chest. She had pizza just prior and lemonade so we discussed this could be related to reflux.       HEMATOLOGICAL / ONCOLOGICAL HISTORY:     Oncology History    CLL    First seen in oncology clinic locally 03/31/16    Had a left axillary biopsy 03/15/16 that confirmed CLL/SLL: Flow showed monoclonal B-cell population with CD5, CD23, kappa light chain. IHC showed positive CD20 (diffuse), negative for cyclin D1.    No constitutional symptoms except some sweats    Labs: Hb 12.0, Hct 34.8, plt 147. WBC was 10.6 with ANC 2.5. ALC was 7.5K.    CLL FISH studies: normal panel. No cells with 11q, tri 12, 17p or 13q, and the 11;14 translocation also not detected.    Karyotype: 46,XX,der(4)t(4;4)(p14;q21)[3]/45,XX,dic(6;17)(q12;p11.2) (no loss of TP53 on FISH)    Molecular: No TP53 mutation    CLL-IPI: As per Lancet Oncol Vol 15 October 2014  Includes (1) TP53 status (no abnormalities vs. 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 MRD = 0.32%.    05/15/18: Repeated BM bx given persistent low counts - ~1% CLL in otherwise normal appearing marrow. Myeloid mutation panel negative. Karyotype: 45,XX,dic(6;17)(q12;p11.2)[2]/46,XX[18].          CLL (chronic lymphocytic leukemia) (CMS-HCC)    05/16/2016 Initial Diagnosis     CLL (chronic lymphocytic leukemia) (RAF-HCC)      09/14/2017 - 10/05/2017 Chemotherapy     Rituximab weekly x4  No response      10/23/2017 -  Chemotherapy     Began ven/obi      03/19/2018 Adverse Reaction     Obinutuzumab held given persistent thrombocytopenia. Marrow performed on 03/22/18, patient with no overt CLL (0.32% by MRD flow testing from [facility]). Do not plan to give 6th cycle of obi (unless counts recover in a reasonable timeframe).      04/09/2018 Adverse Reaction     ANC down to 0.2. Held venetoclax (was previously at 200 mg daily).        B-cell chronic lymphocytic leukemia (CMS-HCC)    09/05/2017 -  10/11/2017 Chemotherapy     Chemotherapy Treatment    Treatment Goal Control   Line of Treatment [No plan line of treatment]   Plan Name OP RITUXIMAB    Start Date 09/14/2017   End Date 10/05/2017   Provider Pernell Dupre, MD   Chemotherapy riTUXimab (RITUXAN) 712.5 mg in sodium chloride (NS) 0.9 % 500 mL IVPB, 375 mg/m2 = 712.5 mg, Intravenous, Once, 1 of 1 cycle  Administration: 712.5 mg (09/14/2017)  riTUXimab (RITUXAN) 712.5 mg in sodium chloride (NS) 0.9 % 250 mL rapid infusion, 375 mg/m2 = 712.5 mg, Intravenous, Once, 1 of 1 cycle  Administration: 712.5 mg (09/21/2017), 712.5 mg (09/28/2017), 712.5 mg (10/05/2017)         10/16/2017 Initial Diagnosis     Chronic lymphocytic leukemia, Rai stage IV (CMS-HCC)      10/23/2017 -  Chemotherapy     OP OBINUTUZUMAB AND VENETOCLAX  chlorambucil 0.5 mg/kg PO on days 1, 15, obinutuzumab 100 mg IV on day 1, obinutuzumab 900 mg IV on day 2, obinutuzumab 1,000 mg IV on days 8,15 on Cycle 1, then obinutuzumab 1,000 mg IV on day 1 for sequent cycles, every 28 days.              OTHER PAST MEDICAL HISTORY:     Patient Active Problem List   Diagnosis   ??? CLL (chronic lymphocytic leukemia) (CMS-HCC)   ??? Chronic migraine without aura   ??? 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 Left bundle branch block (LBBB)   ??? Hypercholesterolemia   ??? Chest pain, unspecified   ??? GERD (gastroesophageal reflux disease)   ??? Osteoporosis   ??? Needs flu shot   ??? BRBPR (bright red blood per rectum)   ??? Other autoimmune hemolytic anemias (CMS-HCC)    ??? B-cell chronic lymphocytic leukemia (CMS-HCC)   ??? Acute diarrhea   ??? Aortic atherosclerosis (CMS-HCC)   ??? Diverticulitis of colon   ??? Epigastric pain   ??? Gastritis, Helicobacter pylori   ??? H/O adenomatous polyp of colon   ??? Healthcare maintenance   ??? LLQ pain   ??? Lymphadenopathy   ??? Migraine without aura and without status migrainosus, not intractable   ??? Syncope and collapse   ??? White matter disease   ??? Vasovagal syncope   ??? Chemotherapy-induced neutropenia (CMS-HCC)   ??? Coccyx pain   ??? Chemotherapy induced diarrhea   ??? Chronic right shoulder pain   ??? Chemotherapy-induced thrombocytopenia       ALLERGIES:     Baclofen    MEDICATIONS:     Outpatient Encounter Medications as of 08/06/2018   Medication Sig Dispense Refill   ??? eltrombopag (PROMACTA) 25 MG tablet Take 3 tablets (75 mg total) by mouth daily. Administer on an empty stomach, 1 hour before or 2 hours after a meal. 90 tablet 2   ??? fluticasone propion-salmeterol (ADVAIR) 100-50 mcg/dose diskus Inhale 2 puffs daily.      ??? lidocaine-me.sal-menthol-camph (CBD-KINGS WITH LIDOCAINE) 4-9-1-1.2 % PtMd Apply 1 Dose topically Four (4) times a day.     ??? loperamide (IMODIUM A-D) 2 mg tablet Take 2 mg by mouth Three (3) times a day as needed for diarrhea.     ??? LORazepam (ATIVAN) 0.5 MG tablet Take 0.5 mg by mouth two (2) times a day as needed.      ??? mirtazapine (REMERON) 7.5 MG tablet Take 7.5 mg by mouth nightly.     ??? multivitamin (TAB-A-VITE/THERAGRAN) per tablet Take  1 tablet by mouth daily.      ??? oxyCODONE (ROXICODONE) 5 MG immediate release tablet Take 1 tablet (5 mg total) by mouth every eight (8) hours as needed for pain. 20 tablet 0   ??? pantoprazole (PROTONIX) 40 MG tablet Take by mouth daily.      ??? pravastatin (PRAVACHOL) 80 MG tablet TAKE 1 TABLET BY MOUTH EVERY NIGHT AT BEDTIME     ??? sertraline (ZOLOFT) 50 MG tablet Take 50 mg by mouth daily.  1   ??? topiramate (TOPAMAX) 50 MG tablet Take 50 mg by mouth daily.  3     No facility-administered encounter medications on file as of 08/06/2018.        Labs from 07/31/18:  ANC 0.4, plt 42, Hb 13

## 2018-08-07 NOTE — Unmapped (Signed)
Katrina Gould contacted the [employer] regarding the following:    - Following up on a call with you.  She said the number you gave her told her she'd have to talk to you.    Please contact at [phone].    Thanks in advance,    Vernie Ammons  [facility]   [phone]

## 2018-08-08 NOTE — Unmapped (Signed)
R/c to Katrina Gould re: insurance claim. She will have insurance company fax a new form to be completed to [phone]. Insurance company has informed her that statement must be revised to successfully file claim. Pt expressed appreciation for the call.

## 2018-08-19 NOTE — Unmapped (Signed)
08/19/18    Travel Screening Questions Completed.    Travel Screening Questions/Answers:  1). Have you traveled within the last 14 days?: No  2). Do you have new or worsening respiratory symptoms (e.g. cough, difficulty breathing)?: No  3). Have you had close contact with a person with confirmed COVID-19 in the last 14 days before symptoms began?: No      Negative Travel Screen: Patient answered NO to questions 2 and 3. Proceed with current scheduling protocol for your clinic.       The call was handled in the following manner: Documented patient response and encounter closed     Advised of visitor restrictions: Patients are  encouraged not to bring visitors with them to their appointment unless needed for safety reasons. Advised that they will be given a mask and screened upon arrival.

## 2018-08-20 ENCOUNTER — Ambulatory Visit: Admit: 2018-08-20 | Discharge: 2018-08-20 | Payer: MEDICARE

## 2018-08-20 ENCOUNTER — Institutional Professional Consult (permissible substitution)
Admit: 2018-08-20 | Discharge: 2018-08-20 | Payer: MEDICARE | Attending: Hematology & Oncology | Primary: Hematology & Oncology

## 2018-08-20 DIAGNOSIS — C9110 Chronic lymphocytic leukemia of B-cell type not having achieved remission: Secondary | ICD-10-CM

## 2018-08-20 LAB — CBC W/ AUTO DIFF
BASOPHILS ABSOLUTE COUNT: 0 10*9/L (ref 0.0–0.1)
BASOPHILS RELATIVE PERCENT: 0.5 %
EOSINOPHILS ABSOLUTE COUNT: 0.1 10*9/L (ref 0.0–0.4)
EOSINOPHILS RELATIVE PERCENT: 3.4 %
HEMATOCRIT: 39.4 % (ref 36.0–46.0)
HEMOGLOBIN: 13.4 g/dL — ABNORMAL LOW (ref 13.5–16.0)
LARGE UNSTAINED CELLS: 4 % (ref 0–4)
LYMPHOCYTES ABSOLUTE COUNT: 1 10*9/L — ABNORMAL LOW (ref 1.5–5.0)
LYMPHOCYTES RELATIVE PERCENT: 37.4 %
MEAN CORPUSCULAR HEMOGLOBIN CONC: 34 g/dL (ref 31.0–37.0)
MEAN CORPUSCULAR HEMOGLOBIN: 29.4 pg (ref 26.0–34.0)
MEAN CORPUSCULAR VOLUME: 86.4 fL (ref 80.0–100.0)
MONOCYTES ABSOLUTE COUNT: 0.3 10*9/L (ref 0.2–0.8)
MONOCYTES RELATIVE PERCENT: 11.6 %
NEUTROPHILS ABSOLUTE COUNT: 1.1 10*9/L — ABNORMAL LOW (ref 2.0–7.5)
NEUTROPHILS ABSOLUTE COUNT: 1.1 10*9/L — ABNORMAL LOW (ref 2.0–7.5)
NEUTROPHILS RELATIVE PERCENT: 42.8 %
PLATELET COUNT: 51 10*9/L — ABNORMAL LOW (ref 150–440)
RED BLOOD CELL COUNT: 4.56 10*12/L (ref 4.00–5.20)
RED CELL DISTRIBUTION WIDTH: 15.6 % — ABNORMAL HIGH (ref 12.0–15.0)
WBC ADJUSTED: 2.6 10*9/L — ABNORMAL LOW (ref 4.5–11.0)

## 2018-08-20 NOTE — Unmapped (Signed)
Labs drawn via [place].    Port flushed and brisk blood return noted.    Pt tolerated well.    Results reviewed - WNL.    Interventions - no intervention needed.    Pt stable and ambulated independently from clinic.  Copy of AVS given.  VWilliams,RN.

## 2018-08-20 NOTE — Unmapped (Signed)
[facility] Leukemia Clinic Telephone Visit    Patient Name: Katrina Gould  Patient Age: 78 y.o.  Encounter Date: 08/20/2018    Contact Information    Visit Conducted via   Person Contacted:   Patient location: [employer] number: [phone] (home) [phone] (work)  Is there someone else in the room? No    Primary Care Provider:  Lauro Gould    Referring Physician:  Lauro Gould    REASON FOR VISIT:   78 y.o. female referred by Katrina Gould in consultation for evaluation of CLL.    ASSESSMENT/PLAN   Assessment:  Katrina Gould is a 78 y.o. female who presents for follow of her CLL/SLL, Rai stage 4. Disease markers: 46,XX,der(4)t(4;4)(p14;q21). IGHV unmutated (0%, 3-33*01). B68m 5.05. No TP53 mutation. High-risk by CLL-IPI (6 points).  ??  [name] continues to do fairly well. She had not needed granix since 1/3 but did require it again on 4/3 for ANC 0.4. Her platelets remain stably low but a bit improved, up to 51K today. ANC 1.1 today.   ??  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 She has had 2 BM bx's for work up and neither showed evidence for MDS.  ??  We will continue to follow her closely with return appointment in two weeks. Will consider repeating a BM bx if the low plts persist in coming months.  ??  Plans and Recommendations:  1. CLL, Rai 4   - Consider resuming Venetoclax at 100 mg when platelets recover to >100 (still on hold currently due to plt <<100)  - Continue eltrombopag 75 mg PO daily (dose was increased on 06/08/18)  - RTC in 2 weeks (labs and phone visit)  ??  2. Arthritis: she feels she has had benefit from topical CBD oil  - May continue prn tylenol but should avoid NSAIDs  - topical CBD is ok; I told her I do not recommend taking CBD oil due to medication interactions (I told her this is her choice but could be risky, she will avoid for now)  ??  3. Suspected Reflux   - told pt to avoid spicy/citrusy type foods which may aggravate this     4. Health Maintenance: Up to date on 2019 influenza vaccine  - continue annual dermatology follow up  - 1st Shingrix vaccine was given 06/2017; patient will need second vaccine, but previously recommended that she get this done at a pharmacy or at her PCP's office for insurance purposes    I spent 11 minutes on the phone with the patient. I spent an additional 15 minutes on pre- and post-visit activities.     The patient was physically located in [place] or a state in which I am permitted to provide care. The patient understood that s/he may incur co-pays and cost sharing, and agreed to the telemedicine visit. The visit was completed via phone and/or video, which was appropriate and reasonable under the circumstances given the patient's presentation at the time.    The patient has been advised of the potential risks and limitations of this mode of treatment (including, but not limited to, the absence of in-person examination) and has agreed to be treated using telemedicine. The patient's/patient's family's questions regarding telemedicine have been answered.     If the phone/video visit was completed in an ambulatory setting, the patient has also been advised to contact their provider???s office for worsening  conditions, and seek emergency medical treatment and/or call 911 if the patient deems either necessary.        Katrina Real, MD  Assistant Professor of Medicine      INTERVAL HISTORY     Spoke by phone for follow-up.     Denies bleeding or bruising.     Feels more tired lately. Has been walking on the street but feels tired after walking a bit. Wonders if she had been too sedentary at home and leading to her tiredness.    A bit frustrated that her labs had not been drawn yet as her appt was 40 min ago.    HEMATOLOGICAL / ONCOLOGICAL HISTORY:     Oncology History    CLL    First seen in oncology clinic locally 03/31/16    Had a left axillary biopsy 03/15/16 that confirmed CLL/SLL: Flow showed monoclonal B-cell population with CD5, CD23, kappa light chain. IHC showed positive CD20 (diffuse), negative for cyclin D1.    No constitutional symptoms except some sweats    Labs: Hb 12.0, Hct 34.8, plt 147. WBC was 10.6 with ANC 2.5. ALC was 7.5K.    CLL FISH studies: normal panel. No cells with 11q, tri 12, 17p or 13q, and the 11;14 translocation also not detected.    Karyotype: 46,XX,der(4)t(4;4)(p14;q21)[3]/45,XX,dic(6;17)(q12;p11.2) (no loss of TP53 on FISH)    Molecular: No TP53 mutation    CLL-IPI: As per Lancet Oncol Vol 15 October 2014  Includes (1) TP53 status (no abnormalities vs. 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 MRD = 0.32%.    05/15/18: Repeated BM bx given persistent low counts - ~1% CLL in otherwise normal appearing marrow. Myeloid mutation panel negative. Karyotype: 45,XX,dic(6;17)(q12;p11.2)[2]/46,XX[18].          CLL (chronic lymphocytic leukemia) (CMS-HCC)    03/15/2016 Initial Diagnosis     CLL (chronic lymphocytic leukemia) (RAF-HCC)      09/14/2017 - 10/05/2017 Chemotherapy     Rituximab weekly x4  No response      10/23/2017 -  Chemotherapy     Began ven/obi      03/19/2018 Adverse Reaction     Obinutuzumab held given persistent thrombocytopenia. Marrow performed on 03/22/18, patient with no overt CLL (0.32% by MRD flow testing from [facility]). Do not plan to give 6th cycle of obi (unless counts recover in a reasonable timeframe).      04/09/2018 Adverse Reaction     ANC down to 0.2. Held venetoclax (was previously at 200 mg daily).        B-cell chronic lymphocytic leukemia (CMS-HCC)    09/05/2017 - 10/11/2017 Chemotherapy  Chemotherapy Treatment    Treatment Goal Control   Line of Treatment [No plan line of treatment]   Plan Name OP RITUXIMAB    Start Date 09/14/2017   End Date 10/05/2017   Provider Pernell Dupre, MD   Chemotherapy riTUXimab (RITUXAN) 712.5 mg in sodium chloride (NS) 0.9 % 500 mL IVPB, 375 mg/m2 = 712.5 mg, Intravenous, Once, 1 of 1 cycle  Administration: 712.5 mg (09/14/2017)  riTUXimab (RITUXAN) 712.5 mg in sodium chloride (NS) 0.9 % 250 mL rapid infusion, 375 mg/m2 = 712.5 mg, Intravenous, Once, 1 of 1 cycle  Administration: 712.5 mg (09/21/2017), 712.5 mg (09/28/2017), 712.5 mg (10/05/2017)         10/16/2017 Initial Diagnosis     Chronic lymphocytic leukemia, Rai stage IV (CMS-HCC)      10/23/2017 -  Chemotherapy     OP OBINUTUZUMAB AND VENETOCLAX  chlorambucil 0.5 mg/kg PO on days 1, 15, obinutuzumab 100 mg IV on day 1, obinutuzumab 900 mg IV on day 2, obinutuzumab 1,000 mg IV on days 8,15 on Cycle 1, then obinutuzumab 1,000 mg IV on day 1 for sequent cycles, every 28 days.              OTHER PAST MEDICAL HISTORY:     Patient Active Problem List   Diagnosis   ??? CLL (chronic lymphocytic leukemia) (CMS-HCC)   ??? Chronic migraine without aura   ??? 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 Left bundle branch block (LBBB)   ??? Hypercholesterolemia   ??? Chest pain, unspecified   ??? GERD (gastroesophageal reflux disease)   ??? Osteoporosis   ??? Needs flu shot   ??? BRBPR (bright red blood per rectum)   ??? Other autoimmune hemolytic anemias (CMS-HCC)    ??? B-cell chronic lymphocytic leukemia (CMS-HCC)   ??? Acute diarrhea   ??? Aortic atherosclerosis (CMS-HCC) ??? Diverticulitis of colon   ??? Epigastric pain   ??? Gastritis, Helicobacter pylori   ??? H/O adenomatous polyp of colon   ??? Healthcare maintenance   ??? LLQ pain   ??? Lymphadenopathy   ??? Migraine without aura and without status migrainosus, not intractable   ??? Syncope and collapse   ??? White matter disease   ??? Vasovagal syncope   ??? Chemotherapy-induced neutropenia (CMS-HCC)   ??? Coccyx pain   ??? Chemotherapy induced diarrhea   ??? Chronic right shoulder pain   ??? Chemotherapy-induced thrombocytopenia       ALLERGIES:     Baclofen    MEDICATIONS:       Outpatient Encounter Medications as of 08/20/2018   Medication Sig Dispense Refill   ??? eltrombopag (PROMACTA) 25 MG tablet Take 3 tablets (75 mg total) by mouth daily. Administer on an empty stomach, 1 hour before or 2 hours after a meal. 90 tablet 2   ??? fluticasone propion-salmeterol (ADVAIR) 100-50 mcg/dose diskus Inhale 2 puffs daily.      ??? lidocaine-me.sal-menthol-camph (CBD-KINGS WITH LIDOCAINE) 4-9-1-1.2 % PtMd Apply 1 Dose topically Four (4) times a day.     ??? loperamide (IMODIUM A-D) 2 mg tablet Take 2 mg by mouth Three (3) times a day as needed for diarrhea.     ??? LORazepam (ATIVAN) 0.5 MG tablet Take 0.5 mg by mouth two (2) times a day as needed.      ??? mirtazapine (REMERON) 7.5 MG tablet Take 7.5 mg by mouth nightly.     ??? multivitamin (TAB-A-VITE/THERAGRAN) per tablet Take 1 tablet by mouth daily.      ???  oxyCODONE (ROXICODONE) 5 MG immediate release tablet Take 1 tablet (5 mg total) by mouth every eight (8) hours as needed for pain. 20 tablet 0   ??? pantoprazole (PROTONIX) 40 MG tablet Take by mouth daily.      ??? pravastatin (PRAVACHOL) 80 MG tablet TAKE 1 TABLET BY MOUTH EVERY NIGHT AT BEDTIME     ??? sertraline (ZOLOFT) 50 MG tablet Take 50 mg by mouth daily.  1   ??? topiramate (TOPAMAX) 50 MG tablet Take 50 mg by mouth daily.  3     Facility-Administered Encounter Medications as of 08/20/2018   Medication Dose Route Frequency Provider Last Rate Last Dose   ??? tbo-filgrastim (GRANIX) injection 480 mcg  480 mcg Subcutaneous Once Adine Madura Sawchak, AGNP             WBC 2.6, ANC 1.1  Hb 13.4  Plt 51

## 2018-08-20 NOTE — Unmapped (Signed)
Please return in 2 weeks for a follow up phone visit and labs.    Please call us if you experience:    1. Nausea or vomiting not controlled by nausea medicines  2. Fever of 100.5 F or higher, shaking chills, drenching night sweats.  3. Uncontrolled pain  4. Any rapidly enlarging lymph node or mass  5. Unintentional weight loss  6. Any other concerning symptom     MyChart Messages  For your safety and best care, please DO NOT use MyChart messages to report symptoms. (Symptoms should be reported by calling the nurse triage line). Please use MyChart for non-urgent matters such as general questions, non-urgent prescription refills, or non-urgent scheduling issues.     ?? Please do not use MyChart for URGENT messages, as messages are only checked during regular business hours.     ?? Please note that MyChart messages may be routed a central pool and one of your provider???s team members will get back to you.  - Expect up to 3 business days for response     If you have any other questions, please do not hesitate to contact us.    Nurse Navigator: Milinda Antis, RN      For health related questions Monday through Friday 8 AM??? 5 PM : please call the office at [phone] and ask to speak with a nurse.  For appointment changes call: Main Clinic [phone].  Toll free number is [phone].    On Nights, Weekends and Holidays:  Call [phone] and ask for the adult hematologist/oncologist on call.      [facility]  [address]  [place], [place] [address]  [email or web address]

## 2018-09-03 NOTE — Unmapped (Signed)
Unable to reach pt via phone. Left message reguarding the pre-appointment screening for COVID-19. Instructed to call only if they answer Yes to any of the 4 questions in the prescreening. Provided [facility] contact information.    Informed patient that only 1 guest will be allowed to accompanied them to their scheduled appointment. No one under the age of 66 is allowed to come into the building. Everyone needs to wear a mask. If you don't have a mask, a mask will be provided and needs to been worn during the entire visit.

## 2018-09-04 ENCOUNTER — Ambulatory Visit: Admit: 2018-09-04 | Discharge: 2018-09-05 | Payer: MEDICARE

## 2018-09-04 ENCOUNTER — Institutional Professional Consult (permissible substitution): Admit: 2018-09-04 | Discharge: 2018-09-05 | Payer: MEDICARE | Attending: Adult Health | Primary: Adult Health

## 2018-09-04 DIAGNOSIS — T451X5A Adverse effect of antineoplastic and immunosuppressive drugs, initial encounter: Secondary | ICD-10-CM

## 2018-09-04 DIAGNOSIS — G8929 Other chronic pain: Secondary | ICD-10-CM

## 2018-09-04 DIAGNOSIS — C9110 Chronic lymphocytic leukemia of B-cell type not having achieved remission: Secondary | ICD-10-CM

## 2018-09-04 DIAGNOSIS — D701 Agranulocytosis secondary to cancer chemotherapy: Secondary | ICD-10-CM

## 2018-09-04 DIAGNOSIS — D693 Immune thrombocytopenic purpura: Principal | ICD-10-CM

## 2018-09-04 DIAGNOSIS — M25511 Pain in right shoulder: Secondary | ICD-10-CM

## 2018-09-04 DIAGNOSIS — D6959 Other secondary thrombocytopenia: Secondary | ICD-10-CM

## 2018-09-04 MED ORDER — ELTROMBOPAG 25 MG TABLET
ORAL_TABLET | Freq: Every day | ORAL | 2 refills | 0 days | Status: CP
Start: 2018-09-04 — End: 2018-09-06

## 2018-09-04 NOTE — Unmapped (Addendum)
[facility] Telephone Visit    Patient Name: Katrina Gould  Patient Age: 78 y.o.  Encounter Date: 09/04/2018      This patient visit was completed through the use of an audio/video or telephone encounter.    I spent 15 minutes on the phone with the patient. I spent an additional 10 minutes on pre- and post-visit activities.     The patient was physically located in [place] or a state in which I am permitted to provide care. The patient understood that s/he may incur co-pays and cost sharing, and agreed to the telemedicine visit. The visit was completed via phone and/or video, which was appropriate and reasonable under the circumstances given the patient's presentation at the time.    The patient has been advised of the potential risks and limitations of this mode of treatment (including, but not limited to, the absence of in-person examination) and has agreed to be treated using telemedicine. The patient's/patient's family's questions regarding telemedicine have been answered.     If the phone/video visit was completed in an ambulatory setting, the patient has also been advised to contact their provider???s office for worsening conditions, and seek emergency medical treatment and/or call 911 if the patient deems either necessary.    Visit conducted by: Telephone  Person contacted: [name] phone number: [phone]  Is there someone else in the room? no         Primary Care Provider:  Lauro Regulus    Referring Physician:  Lauro Regulus    REASON FOR VISIT:   78 y.o. female referred by Lauro Regulus in consultation for evaluation of CLL.    ASSESSMENT/PLAN   Assessment:  Katrina Gould is a 78 y.o. female who presents for follow of her CLL/SLL, Rai stage 4. Disease markers: 46,XX,der(4)t(4;4)(p14;q21). IGHV unmutated (0%, 3-33*01). B48m 5.05. No TP53 mutation. High-risk by CLL-IPI (6 points).  ??  [name] continues to do fairly well. She did not get labs this morning as scheduled, but will get them done tomorrow.  She last received granix on 4/1.  Will review labs when they result tomorrow.    ??  Per Dr. Lonni Fix' previous plan, will consider repeat BMbx if plts continue to be low.  ??  ??  Plans and Recommendations:  1. CLL, Rai 4   - Consider resuming Venetoclax at 100 mg when platelets recover to >100 (still on hold currently due to plt <<100)  - Continue eltrombopag 75 mg PO daily refill sent today  - did not know about lab appt today; will get them drawn tomorrow  - RTC in 2 weeks (labs and phone visit) - already scheduled  ??  2. Arthritis  - continues to use topical CBD oil and this has helped  - May continue prn tylenol but should avoid NSAIDs  - knows to avoid oral CBD oil  ??  3. Suspected Reflux   - told pt to avoid spicy/citrusy type foods which may aggravate this     4. Health Maintenance: Up to date on 2019 influenza vaccine  - continue annual dermatology follow up  - 1st Shingrix vaccine was given 06/2017; patient will need second vaccine, but previously recommended that she get this done at a pharmacy or at her PCP's office for insurance purposes    Markus Jarvis, AGPCNP-C, MSN, OCN  Nurse Practitioner  Hematologic Malignancies  [facility]  [phone] (phone)  [phone] (fax)  [email or web address]  INTERVAL HISTORY   Reports that she is feeling a little better, though arthritis is worsening.  Her shoulder is causing her a lot of pain.  PCP says she needs a replacement, but can't do it right now.  Doing cortisone shots.  Larey Seat recently, but did not hit her head.  Did end up landing on her knee and skinning her knee.  Also tore her toenail off.  Her knee gives out on her and she falls.  She uses CBD cream on her arm and shoulder and knee.  Feels that this really helps.  Taking tylenol no more than every 6 hours.  No longer has diarrhea.    Otherwise, denies new bone pain, fevers, chills, night sweats, lumps/bumps, tongue swelling, shortness of breath, syncope, lightheadedness, constipation or diarrhea, nausea or vomiting, very easy bruising or bleeding, or urinary changes.       HEMATOLOGICAL / ONCOLOGICAL HISTORY:     Oncology History    CLL    First seen in oncology clinic locally 03/31/16    Had a left axillary biopsy 03/15/16 that confirmed CLL/SLL: Flow showed monoclonal B-cell population with CD5, CD23, kappa light chain. IHC showed positive CD20 (diffuse), negative for cyclin D1.    No constitutional symptoms except some sweats    Labs: Hb 12.0, Hct 34.8, plt 147. WBC was 10.6 with ANC 2.5. ALC was 7.5K.    CLL FISH studies: normal panel. No cells with 11q, tri 12, 17p or 13q, and the 11;14 translocation also not detected.    Karyotype: 46,XX,der(4)t(4;4)(p14;q21)[3]/45,XX,dic(6;17)(q12;p11.2) (no loss of TP53 on FISH)    Molecular: No TP53 mutation    CLL-IPI: As per Lancet Oncol Vol 15 October 2014  Includes (1) TP53 status (no abnormalities vs. 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 MRD = 0.32%.    05/15/18: Repeated BM bx given persistent low counts - ~1% CLL in otherwise normal appearing marrow. Myeloid mutation panel negative. Karyotype: 45,XX,dic(6;17)(q12;p11.2)[2]/46,XX[18].          CLL (chronic lymphocytic leukemia) (CMS-HCC)    03/15/2016 Initial Diagnosis     CLL (chronic lymphocytic leukemia) (RAF-HCC)      09/14/2017 - 10/05/2017 Chemotherapy     Rituximab weekly x4  No response      10/23/2017 -  Chemotherapy     Began ven/obi      03/19/2018 Adverse Reaction     Obinutuzumab held given persistent thrombocytopenia. Marrow performed on 03/22/18, patient with no overt CLL (0.32% by MRD flow testing from [facility]). Do not plan to give 6th cycle of obi (unless counts recover in a reasonable timeframe).      04/09/2018  Adverse Reaction     ANC down to 0.2. Held venetoclax (was previously at 200 mg daily).        B-cell chronic lymphocytic leukemia (CMS-HCC)    09/05/2017 - 10/11/2017 Chemotherapy     Chemotherapy Treatment    Treatment Goal Control   Line of Treatment [No plan line of treatment]   Plan Name OP RITUXIMAB    Start Date 09/14/2017   End Date 10/05/2017   Provider Pernell Dupre, MD   Chemotherapy riTUXimab (RITUXAN) 712.5 mg in sodium chloride (NS) 0.9 % 500 mL IVPB, 375 mg/m2 = 712.5 mg, Intravenous, Once, 1 of 1 cycle  Administration: 712.5 mg (09/14/2017)  riTUXimab (RITUXAN) 712.5 mg in sodium chloride (NS) 0.9 % 250 mL rapid infusion, 375 mg/m2 = 712.5 mg, Intravenous, Once, 1 of 1 cycle  Administration: 712.5 mg (09/21/2017), 712.5 mg (09/28/2017), 712.5 mg (10/05/2017)         10/16/2017 Initial Diagnosis     Chronic lymphocytic leukemia, Rai stage IV (CMS-HCC)      10/23/2017 -  Chemotherapy     OP OBINUTUZUMAB AND VENETOCLAX  chlorambucil 0.5 mg/kg PO on days 1, 15, obinutuzumab 100 mg IV on day 1, obinutuzumab 900 mg IV on day 2, obinutuzumab 1,000 mg IV on days 8,15 on Cycle 1, then obinutuzumab 1,000 mg IV on day 1 for sequent cycles, every 28 days.              OTHER PAST MEDICAL HISTORY:     Patient Active Problem List   Diagnosis   ??? CLL (chronic lymphocytic leukemia) (CMS-HCC)   ??? Chronic migraine without aura   ??? 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 Left bundle branch block (LBBB)   ??? Hypercholesterolemia ??? Chest pain, unspecified   ??? GERD (gastroesophageal reflux disease)   ??? Osteoporosis   ??? Needs flu shot   ??? BRBPR (bright red blood per rectum)   ??? Other autoimmune hemolytic anemias (CMS-HCC)    ??? B-cell chronic lymphocytic leukemia (CMS-HCC)   ??? Acute diarrhea   ??? Aortic atherosclerosis (CMS-HCC)   ??? Diverticulitis of colon   ??? Epigastric pain   ??? Gastritis, Helicobacter pylori   ??? H/O adenomatous polyp of colon   ??? Healthcare maintenance   ??? LLQ pain   ??? Lymphadenopathy   ??? Migraine without aura and without status migrainosus, not intractable   ??? Syncope and collapse   ??? White matter disease   ??? Vasovagal syncope   ??? Chemotherapy-induced neutropenia (CMS-HCC)   ??? Coccyx pain   ??? Chemotherapy induced diarrhea   ??? Chronic right shoulder pain   ??? Chemotherapy-induced thrombocytopenia       ALLERGIES:     Baclofen    MEDICATIONS:       Outpatient Encounter Medications as of 09/04/2018   Medication Sig Dispense Refill   ??? eltrombopag (PROMACTA) 25 MG tablet Take 3 tablets (75 mg total) by mouth daily. Administer on an empty stomach, 1 hour before or 2 hours after a meal. 90 tablet 2   ??? fluticasone propion-salmeterol (ADVAIR) 100-50 mcg/dose diskus Inhale 2 puffs daily.      ??? lidocaine-me.sal-menthol-camph (CBD-KINGS WITH LIDOCAINE) 4-9-1-1.2 % PtMd Apply 1 Dose topically Four (4) times a day.     ??? loperamide (IMODIUM A-D) 2 mg tablet Take 2 mg by mouth Three (3) times a day as needed for diarrhea.     ??? LORazepam (ATIVAN) 0.5 MG tablet Take 0.5 mg by  mouth two (2) times a day as needed.      ??? mirtazapine (REMERON) 7.5 MG tablet Take 7.5 mg by mouth nightly.     ??? multivitamin (TAB-A-VITE/THERAGRAN) per tablet Take 1 tablet by mouth daily.      ??? oxyCODONE (ROXICODONE) 5 MG immediate release tablet Take 1 tablet (5 mg total) by mouth every eight (8) hours as needed for pain. 20 tablet 0   ??? pantoprazole (PROTONIX) 40 MG tablet Take by mouth daily.      ??? pravastatin (PRAVACHOL) 80 MG tablet TAKE 1 TABLET BY MOUTH EVERY NIGHT AT BEDTIME     ??? sertraline (ZOLOFT) 50 MG tablet Take 50 mg by mouth daily.  1   ??? tiZANidine (ZANAFLEX) 2 MG tablet Take 2 mg by mouth Three (3) times a day as needed.     ??? topiramate (TOPAMAX) 50 MG tablet Take 50 mg by mouth daily.  3     Facility-Administered Encounter Medications as of 09/04/2018   Medication Dose Route Frequency Provider Last Rate Last Dose   ??? tbo-filgrastim (GRANIX) injection 480 mcg  480 mcg Subcutaneous Once Vernie Murders, [place]           Physical Exam:  Vital signs: There were no vitals taken for this visit.  LIMITED EXAM DUE TO ATTEMPT TO LIMIT CONTACT DUE TO CORONAVIRUS IN THE COMMUNITY    GENERAL:  The patient sounds well and is in no acute distress.  RESP: Breathing comfortably and unlabored speech.    NEURO: A&Ox4  PSYCH: Normal affect    Labs:  Pending draw tomorrow

## 2018-09-05 ENCOUNTER — Ambulatory Visit: Admit: 2018-09-05 | Discharge: 2018-09-06 | Payer: MEDICARE

## 2018-09-05 DIAGNOSIS — C9110 Chronic lymphocytic leukemia of B-cell type not having achieved remission: Secondary | ICD-10-CM

## 2018-09-05 DIAGNOSIS — D6959 Other secondary thrombocytopenia: Principal | ICD-10-CM

## 2018-09-05 DIAGNOSIS — T451X5A Adverse effect of antineoplastic and immunosuppressive drugs, initial encounter: Secondary | ICD-10-CM

## 2018-09-05 LAB — CBC W/ AUTO DIFF
BASOPHILS ABSOLUTE COUNT: 0 10*9/L (ref 0.0–0.1)
BASOPHILS RELATIVE PERCENT: 1.4 %
EOSINOPHILS ABSOLUTE COUNT: 0.1 10*9/L (ref 0.0–0.4)
EOSINOPHILS RELATIVE PERCENT: 4.9 %
HEMOGLOBIN: 12.4 g/dL — ABNORMAL LOW (ref 13.5–16.0)
LARGE UNSTAINED CELLS: 4 % (ref 0–4)
LYMPHOCYTES ABSOLUTE COUNT: 0.8 10*9/L — ABNORMAL LOW (ref 1.5–5.0)
LYMPHOCYTES RELATIVE PERCENT: 47.4 %
MEAN CORPUSCULAR HEMOGLOBIN CONC: 34.4 g/dL (ref 31.0–37.0)
MEAN CORPUSCULAR HEMOGLOBIN: 29.6 pg (ref 26.0–34.0)
MEAN CORPUSCULAR VOLUME: 86.1 fL (ref 80.0–100.0)
MEAN PLATELET VOLUME: 10.4 fL — ABNORMAL HIGH (ref 7.0–10.0)
MONOCYTES ABSOLUTE COUNT: 0.2 10*9/L (ref 0.2–0.8)
NEUTROPHILS ABSOLUTE COUNT: 0.5 10*9/L — ABNORMAL LOW (ref 2.0–7.5)
NEUTROPHILS RELATIVE PERCENT: 28 %
PLATELET COUNT: 67 10*9/L — ABNORMAL LOW (ref 150–440)
RED CELL DISTRIBUTION WIDTH: 16 % — ABNORMAL HIGH (ref 12.0–15.0)
WBC ADJUSTED: 1.6 10*9/L — ABNORMAL LOW (ref 4.5–11.0)

## 2018-09-05 LAB — MEAN CORPUSCULAR HEMOGLOBIN: Lab: 29.6

## 2018-09-05 NOTE — Unmapped (Signed)
L/m for Katrina Gould re: need for her to return to [facility] tomorrow for Granix injection. Provided blameless apology for the inconvenience of needing to make a second trip for labs. Advised that our scheduler would reach out to her to set up a time to return 5/8. Provided emotional support and encouragement.

## 2018-09-05 NOTE — Unmapped (Signed)
Received patient in no acute distress. Reports sore throat and endorses sleeping with mouth open all night states this sometimes happens and makes her throat dry and irritated. Ms Katrina Gould escorted to a private room, VSS patient afebrile. Directed patient to notify primary care physician for worsening symptoms.     Port accessed, labs drawn and sent for analysis.     Does not meet parameters for transfusion. ANC 0.5, encouraged patient to avoid public spaces and always wear a mask outside if she needs to go out for groceries. She states her son has been helping at home. Port flushed and de accessed per policy, patient discharged ambulatory in no acute distress with AVS.

## 2018-09-05 NOTE — Unmapped (Signed)
Infusion on 09/05/2018   Component Date Value Ref Range Status   ??? WBC 09/05/2018 1.6* 4.5 - 11.0 10*9/L Final   ??? RBC 09/05/2018 4.19  4.00 - 5.20 10*12/L Final   ??? HGB 09/05/2018 12.4* 13.5 - 16.0 g/dL Final   ??? HCT 69/62/9528 36.0  36.0 - 46.0 % Final   ??? MCV 09/05/2018 86.1  80.0 - 100.0 fL Final   ??? MCH 09/05/2018 29.6  26.0 - 34.0 pg Final   ??? MCHC 09/05/2018 34.4  31.0 - 37.0 g/dL Final   ??? RDW 41/32/4401 16.0* 12.0 - 15.0 % Final   ??? MPV 09/05/2018 10.4* 7.0 - 10.0 fL Final   ??? Platelet 09/05/2018 67* 150 - 440 10*9/L Final   ??? Variable HGB Concentration 09/05/2018 Slight* Not Present Final   ??? Neutrophils % 09/05/2018 28.0  % Final   ??? Lymphocytes % 09/05/2018 47.4  % Final   ??? Monocytes % 09/05/2018 14.3  % Final   ??? Eosinophils % 09/05/2018 4.9  % Final   ??? Basophils % 09/05/2018 1.4  % Final   ??? Absolute Neutrophils 09/05/2018 0.5* 2.0 - 7.5 10*9/L Final   ??? Absolute Lymphocytes 09/05/2018 0.8* 1.5 - 5.0 10*9/L Final   ??? Absolute Monocytes 09/05/2018 0.2  0.2 - 0.8 10*9/L Final   ??? Absolute Eosinophils 09/05/2018 0.1  0.0 - 0.4 10*9/L Final   ??? Absolute Basophils 09/05/2018 0.0  0.0 - 0.1 10*9/L Final   ??? Large Unstained Cells 09/05/2018 4  0 - 4 % Final       If you feel like this is an emergency please call 911.  For appointments or questions Monday through Friday 8AM-5PM please call [phone] or Toll Free [phone]. For Medical questions or concerns ask for the Nurse Triage Line.  On Nights, Weekends, and Holidays call [phone] and ask for the Oncologist on Call.  Reasons to call the Nurse Triage Line:  Fever of 100.5 or greater  Nausea and/or vomiting not relived with nausea medicine  Diarrhea or constipation  Severe pain not relieved with usual pain regimen  Shortness of breath  Uncontrolled bleeding  Mental status changes

## 2018-09-05 NOTE — Unmapped (Signed)
09/05/2018 Left vm for [name] about 5/8 inj in [facility].

## 2018-09-06 ENCOUNTER — Institutional Professional Consult (permissible substitution): Admit: 2018-09-06 | Discharge: 2018-09-07 | Payer: MEDICARE

## 2018-09-06 DIAGNOSIS — D701 Agranulocytosis secondary to cancer chemotherapy: Principal | ICD-10-CM

## 2018-09-06 DIAGNOSIS — T451X5A Adverse effect of antineoplastic and immunosuppressive drugs, initial encounter: Secondary | ICD-10-CM

## 2018-09-06 MED ORDER — ELTROMBOPAG 25 MG TABLET
ORAL_TABLET | Freq: Every day | ORAL | 2 refills | 0 days | Status: CP
Start: 2018-09-06 — End: 2018-12-25

## 2018-09-06 NOTE — Unmapped (Signed)
Patient received Granix today in right lower abd. Patient tolerated injection without complications and ambulated to checkout without any assistance.

## 2018-09-06 NOTE — Unmapped (Signed)
Blanca Friend with [facility] contacted the Communication Center requesting authorization for:    What: promacta  Needed by: asap  Contact Name: kim  Contact Number: [phone] ext [phone] call [phone] for prior auth    Thank you,   Deatra Ina  [facility]  [phone]

## 2018-09-06 NOTE — Unmapped (Signed)
Message Patent attorney CPP and she messaged Paige Roop.  They will work on getting this medication the authorization needed.

## 2018-09-06 NOTE — Unmapped (Signed)
[facility] Triage Note     Patient: Katrina Gould     Reason for call:  return call    Time call returned: [phone]     Phone Assessment: Spoke with [facility].      Triage Recommendations: Notified them that the prescription was sent in error.     Patient Response: unknown     Outstanding tasks: Care team notification no further actions needed      Patient Pharmacy has been verified and primary pharmacy has been marked as preferred

## 2018-09-16 NOTE — Unmapped (Signed)
LVM to mention any changes in her medication during the phone appointment tomorrow.    Attempted to call pt for pre-appointment screening, pt did not answer. LVM instructing to return call to [phone] for screening. Informed patient of the oncology visitor policy (1 visitor, 80 years old or older) and process of screening prior to entering [facility]. Explained that patient will need to wait to come to their appointment until they have heard from someone if they develop any symptoms overnight (cough, shortness of breath, fever 100+, nausea, diarrhea, nasal congestion). Unable to complete med-rec at this time.

## 2018-09-16 NOTE — Unmapped (Signed)
09/16/18    Travel Screening Questions Completed.    Travel Screening Questions/Answers:  1). Have you traveled within the last 14 days?: No  2). Do you have any of the following symptoms that are new or worsening: cough, shortness of breath, loss of taste/smell, sore throat, fever or feeling feverish, repeated shaking chills, muscle pain, vomiting, or diarrhea?: No  3). Have you had close contact with a person with confirmed COVID-19 in the last 21 days before symptoms began?: No  4). Have you tested positive in the last 21 days for COVID-19?: No      Negative Travel Screen: Patient answered NO to questions 2, 3, and 4. Offer Telephone visit, Virtual Visit or In Person Visit according to the current scheduling protocol for your clinic.       The call was handled in the following manner: Scheduled for In Person Visit     Informed patient that only 1 guest will be allowed to accompanied them to their scheduled appointment. No one under the age of 68 is allowed to come into the building. Everyone needs to wear a mask. If you don't have a mask, a mask will be provided and needs to been worn during the entire visit. Pt verbalized understanding and had no additional questions at this time.

## 2018-09-17 ENCOUNTER — Ambulatory Visit: Admit: 2018-09-17 | Discharge: 2018-09-17 | Payer: MEDICARE

## 2018-09-17 ENCOUNTER — Institutional Professional Consult (permissible substitution)
Admit: 2018-09-17 | Discharge: 2018-09-17 | Payer: MEDICARE | Attending: Hematology & Oncology | Primary: Hematology & Oncology

## 2018-09-17 ENCOUNTER — Institutional Professional Consult (permissible substitution): Admit: 2018-09-17 | Discharge: 2018-09-17 | Payer: MEDICARE

## 2018-09-17 DIAGNOSIS — C9110 Chronic lymphocytic leukemia of B-cell type not having achieved remission: Principal | ICD-10-CM

## 2018-09-17 LAB — CBC W/ AUTO DIFF
BASOPHILS ABSOLUTE COUNT: 0 10*9/L (ref 0.0–0.1)
BASOPHILS RELATIVE PERCENT: 0.3 %
EOSINOPHILS ABSOLUTE COUNT: 0.2 10*9/L (ref 0.0–0.4)
EOSINOPHILS RELATIVE PERCENT: 4 %
HEMATOCRIT: 38 % (ref 36.0–46.0)
LARGE UNSTAINED CELLS: 2 % (ref 0–4)
LYMPHOCYTES ABSOLUTE COUNT: 1 10*9/L — ABNORMAL LOW (ref 1.5–5.0)
LYMPHOCYTES RELATIVE PERCENT: 22.1 %
MEAN CORPUSCULAR HEMOGLOBIN CONC: 34.5 g/dL (ref 31.0–37.0)
MEAN CORPUSCULAR HEMOGLOBIN: 29.8 pg (ref 26.0–34.0)
MEAN CORPUSCULAR VOLUME: 86.3 fL (ref 80.0–100.0)
MONOCYTES ABSOLUTE COUNT: 0.2 10*9/L (ref 0.2–0.8)
MONOCYTES RELATIVE PERCENT: 4.5 %
NEUTROPHILS ABSOLUTE COUNT: 3 10*9/L (ref 2.0–7.5)
NEUTROPHILS RELATIVE PERCENT: 66.9 %
PLATELET COUNT: 66 10*9/L — ABNORMAL LOW (ref 150–440)
RED CELL DISTRIBUTION WIDTH: 16.7 % — ABNORMAL HIGH (ref 12.0–15.0)
WBC ADJUSTED: 4.6 10*9/L (ref 4.5–11.0)

## 2018-09-17 LAB — COMPREHENSIVE METABOLIC PANEL
ALBUMIN: 4 g/dL (ref 3.5–5.0)
ALKALINE PHOSPHATASE: 92 U/L (ref 38–126)
ALT (SGPT): 15 U/L (ref <35)
ANION GAP: 11 mmol/L (ref 7–15)
AST (SGOT): 21 U/L (ref 14–38)
BLOOD UREA NITROGEN: 17 mg/dL (ref 7–21)
CALCIUM: 9.1 mg/dL (ref 8.5–10.2)
CO2: 25 mmol/L (ref 22.0–30.0)
CREATININE: 0.71 mg/dL (ref 0.60–1.00)
EGFR CKD-EPI AA FEMALE: >90 mL/min/{1.73_m2} (ref >=60)
EGFR CKD-EPI NON-AA FEMALE: 81 mL/min/{1.73_m2} (ref >=60)
GLUCOSE RANDOM: 100 mg/dL (ref 70–179)
POTASSIUM: 4.6 mmol/L (ref 3.5–5.0)
PROTEIN TOTAL: 6.3 g/dL — ABNORMAL LOW (ref 6.5–8.3)
SODIUM: 142 mmol/L (ref 135–145)

## 2018-09-17 LAB — AST (SGOT): Aspartate aminotransferase:CCnc:Pt:Ser/Plas:Qn:: 21

## 2018-09-17 LAB — EOSINOPHILS ABSOLUTE COUNT: Lab: 0.2

## 2018-09-17 NOTE — Unmapped (Signed)
Accessed patient's port with a 20g 3/4 inch needle.  Obtained labs and flushed port with 20ml of normal saline and 5ml of heparin. Patient's port was deaccessed prior to leaving clinic.      Patient did not need Granix today.  ANC was 3.0.  Printed labs for patient.      Katrina Gould

## 2018-09-17 NOTE — Unmapped (Signed)
Please return in 2 weeks for a follow up visit and labs.    Please call us if you experience:    1. Nausea or vomiting not controlled by nausea medicines  2. Fever of 100.5 F or higher, shaking chills, drenching night sweats.  3. Uncontrolled pain  4. Any rapidly enlarging lymph node or mass  5. Unintentional weight loss  6. Any other concerning symptom     MyChart Messages  For your safety and best care, please DO NOT use MyChart messages to report symptoms. (Symptoms should be reported by calling the nurse triage line). Please use MyChart for non-urgent matters such as general questions, non-urgent prescription refills, or non-urgent scheduling issues.     ?? Please do not use MyChart for URGENT messages, as messages are only checked during regular business hours.     ?? Please note that MyChart messages may be routed a central pool and one of your provider???s team members will get back to you.  - Expect up to 3 business days for response     If you have any other questions, please do not hesitate to contact us.    Nurse Navigator: Milinda Antis, RN      For health related questions Monday through Friday 8 AM??? 5 PM : please call the office at [phone] and ask to speak with a nurse.  For appointment changes call: Main Clinic [phone].  Toll free number is [phone].    On Nights, Weekends and Holidays:  Call [phone] and ask for the adult hematologist/oncologist on call.      [facility]  [address]  [place], [place] [address]  [email or web address]

## 2018-09-17 NOTE — Unmapped (Signed)
[facility] Telephone Visit    Patient Name: Katrina Gould  Patient Age: 78 y.o.  Encounter Date: 09/17/2018      This patient visit was completed through the use of an audio/video or telephone encounter.    I spent 7 minutes on the phone with the patient. I spent an additional 10 minutes on pre- and post-visit activities.     The patient was physically located in [place] or a state in which I am permitted to provide care. The patient understood that s/he may incur co-pays and cost sharing, and agreed to the telemedicine visit. The visit was completed via phone and/or video, which was appropriate and reasonable under the circumstances given the patient's presentation at the time.    The patient has been advised of the potential risks and limitations of this mode of treatment (including, but not limited to, the absence of in-person examination) and has agreed to be treated using telemedicine. The patient's/patient's family's questions regarding telemedicine have been answered.     If the phone/video visit was completed in an ambulatory setting, the patient has also been advised to contact their provider???s office for worsening conditions, and seek emergency medical treatment and/or call 911 if the patient deems either necessary.    Visit conducted by: Telephone  Person contacted: Katrina Gould phone number: [phone]  Is there someone else in the room? no         Primary Care Provider:  Lauro Regulus    Referring Physician:  Self, Referred    REASON FOR VISIT:   78 y.o. female referred by Self, Referred in consultation for evaluation of CLL.    ASSESSMENT/PLAN   Assessment:  Katrina Gould is a 78 y.o. female who presents for follow of her CLL/SLL, Rai stage 4. Disease markers: 46,XX,der(4)t(4;4)(p14;q21). IGHV unmutated (0%, 3-33*01). B72m 5.05. No TP53 mutation. High-risk by CLL-IPI (6 points).  ??  Katrina Gould continues to do fairly well and is having improvement in plts on the eltrombopag. Still with intermittently low ANC but ANC good at 3.0 today.     Katrina Gould has been on hold for many months now, discussed risk/benefit of restarting it, to prevent CLL from acting up, but will stay on hold for now. Do not plan to resume obinutuzumab (received 5 cycles).  ??  ??  Plans and Recommendations:  1. CLL, Rai 4   - Consider resuming Venetoclax at 100 mg when platelets stabilize (originally was waiting for plt >100, but may consider starting if plts above 75 given that it appears we may not get to 100)  - Continue eltrombopag 75 mg PO daily refill sent today  - RTC in 2 weeks (labs and phone visit)   ??  2. Arthritis  - continues to use topical CBD oil and this has helped, told ok to try next higher %  - May continue prn tylenol but should avoid NSAIDs  - knows to avoid oral CBD oil  ??  3. Health Maintenance: Up to date on 2019 influenza vaccine  - continue annual dermatology follow up  - 1st Shingrix vaccine was given 06/2017; patient will need second vaccine, but previously recommended that she get this done at a pharmacy or at her PCP's office for insurance purposes        INTERVAL HISTORY   Reports that she is feeling well overall - a little sore from doing yardwork yesterday. Otherwise no issues.    Otherwise, denies new  bone pain, fevers, chills, night sweats, lumps/bumps, tongue swelling, shortness of breath, syncope, lightheadedness, constipation or diarrhea, nausea or vomiting, very easy bruising or bleeding, or urinary changes.       HEMATOLOGICAL / ONCOLOGICAL HISTORY:     Oncology History    CLL    First seen in oncology clinic locally 03/31/16    Had a left axillary biopsy 03/15/16 that confirmed CLL/SLL: Flow showed monoclonal B-cell population with CD5, CD23, kappa light chain. IHC showed positive CD20 (diffuse), negative for cyclin D1.    No constitutional symptoms except some sweats    Labs: Hb 12.0, Hct 34.8, plt 147. WBC was 10.6 with ANC 2.5. ALC was 7.5K.    CLL FISH studies: normal panel. No cells with 11q, tri 12, 17p or 13q, and the 11;14 translocation also not detected.    Karyotype: 46,XX,der(4)t(4;4)(p14;q21)[3]/45,XX,dic(6;17)(q12;p11.2) (no loss of TP53 on FISH)    Molecular: No TP53 mutation    CLL-IPI: As per Lancet Oncol Vol 15 October 2014  Includes (1) TP53 status (no abnormalities vs. 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 MRD = 0.32%.    05/15/18: Repeated BM bx given persistent low counts - ~1% CLL in otherwise normal appearing marrow. Myeloid mutation panel negative. Karyotype: 45,XX,dic(6;17)(q12;p11.2)[2]/46,XX[18].          CLL (chronic lymphocytic leukemia) (CMS-HCC)    03/15/2016 Initial Diagnosis     CLL (chronic lymphocytic leukemia) (RAF-HCC)      09/14/2017 - 10/05/2017 Chemotherapy     Rituximab weekly x4  No response      10/23/2017 -  Chemotherapy     Began ven/obi      03/19/2018 Adverse Reaction     Obinutuzumab held given persistent thrombocytopenia. Marrow performed on 03/22/18, patient with no overt CLL (0.32% by MRD flow testing from [facility]). Do not plan to give 6th cycle of obi (unless counts recover in a reasonable timeframe).      04/09/2018 Adverse Reaction     ANC down to 0.2. Held venetoclax (was previously at 200 mg daily).        B-cell chronic lymphocytic leukemia (CMS-HCC)    09/05/2017 - 10/11/2017 Chemotherapy     Chemotherapy Treatment    Treatment Goal Control   Line of Treatment [No plan line of treatment]   Plan Name OP RITUXIMAB    Start Date 09/14/2017   End Date 10/05/2017   Provider Pernell Dupre, MD   Chemotherapy riTUXimab (RITUXAN) 712.5 mg in sodium chloride (NS) 0.9 % 500 mL IVPB, 375 mg/m2 = 712.5 mg, Intravenous, Once, 1 of 1 cycle  Administration: 712.5 mg (09/14/2017)  riTUXimab (RITUXAN)  712.5 mg in sodium chloride (NS) 0.9 % 250 mL rapid infusion, 375 mg/m2 = 712.5 mg, Intravenous, Once, 1 of 1 cycle  Administration: 712.5 mg (09/21/2017), 712.5 mg (09/28/2017), 712.5 mg (10/05/2017)         10/16/2017 Initial Diagnosis     Chronic lymphocytic leukemia, Rai stage IV (CMS-HCC)      10/23/2017 -  Chemotherapy     OP OBINUTUZUMAB AND VENETOCLAX  chlorambucil 0.5 mg/kg PO on days 1, 15, obinutuzumab 100 mg IV on day 1, obinutuzumab 900 mg IV on day 2, obinutuzumab 1,000 mg IV on days 8,15 on Cycle 1, then obinutuzumab 1,000 mg IV on day 1 for sequent cycles, every 28 days.              OTHER PAST MEDICAL HISTORY:     Patient Active Problem List   Diagnosis   ??? CLL (chronic lymphocytic leukemia) (CMS-HCC)   ??? Chronic migraine without aura   ??? 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 Left bundle branch block (LBBB)   ??? Hypercholesterolemia   ??? Chest pain, unspecified   ??? GERD (gastroesophageal reflux disease)   ??? Osteoporosis   ??? Needs flu shot   ??? BRBPR (bright red blood per rectum)   ??? Other autoimmune hemolytic anemias (CMS-HCC)    ??? B-cell chronic lymphocytic leukemia (CMS-HCC)   ??? Acute diarrhea   ??? Aortic atherosclerosis (CMS-HCC)   ??? Diverticulitis of colon   ??? Epigastric pain   ??? Gastritis, Helicobacter pylori   ??? H/O adenomatous polyp of colon   ??? Healthcare maintenance   ??? LLQ pain   ??? Lymphadenopathy   ??? Migraine without aura and without status migrainosus, not intractable   ??? Syncope and collapse   ??? White matter disease   ??? Vasovagal syncope   ??? Chemotherapy-induced neutropenia (CMS-HCC)   ??? Coccyx pain   ??? Chemotherapy induced diarrhea   ??? Chronic right shoulder pain   ??? Chemotherapy-induced thrombocytopenia       ALLERGIES:     Baclofen    MEDICATIONS:       Outpatient Encounter Medications as of 09/17/2018   Medication Sig Dispense Refill   ??? eltrombopag (PROMACTA) 25 MG tablet Take 3 tablets (75 mg total) by mouth daily. Administer on an empty stomach, 1 hour before or 2 hours after a meal. 90 tablet 2   ??? fluticasone propion-salmeterol (ADVAIR) 100-50 mcg/dose diskus Inhale 2 puffs daily.      ??? lidocaine-me.sal-menthol-camph (CBD-KINGS WITH LIDOCAINE) 4-9-1-1.2 % PtMd Apply 1 Dose topically Four (4) times a day.     ??? loperamide (IMODIUM A-D) 2 mg tablet Take 2 mg by mouth Three (3) times a day as needed for diarrhea.     ??? LORazepam (ATIVAN) 0.5 MG tablet Take 0.5 mg by mouth two (2) times a day as needed.      ??? mirtazapine (REMERON) 7.5 MG tablet Take 7.5 mg by mouth nightly.     ??? multivitamin (TAB-A-VITE/THERAGRAN) per tablet Take 1 tablet by mouth daily.      ??? oxyCODONE (ROXICODONE) 5 MG immediate release tablet Take 1 tablet (5 mg total) by mouth every eight (8) hours as needed for pain. 20 tablet 0   ??? pantoprazole (PROTONIX) 40 MG tablet Take by mouth daily.      ??? pravastatin (PRAVACHOL) 80 MG tablet TAKE 1 TABLET BY MOUTH EVERY NIGHT AT BEDTIME     ??? sertraline (ZOLOFT) 50 MG tablet Take 50  mg by mouth daily.  1   ??? tiZANidine (ZANAFLEX) 2 MG tablet Take 2 mg by mouth Three (3) times a day as needed.     ??? topiramate (TOPAMAX) 50 MG tablet Take 50 mg by mouth daily.  3     Facility-Administered Encounter Medications as of 09/17/2018   Medication Dose Route Frequency Provider Last Rate Last Dose   ??? heparin, porcine (PF) 100 unit/mL injection 200 Units  200 Units Intravenous Q30 Min PRN Pernell Dupre, MD   200 Units at 09/17/18 1100   ??? tbo-filgrastim (GRANIX) injection 480 mcg  480 mcg Subcutaneous Once Vernie Murders, [place]           Physical Exam:  LIMITED EXAM DUE TO ATTEMPT TO LIMIT CONTACT DUE TO CORONAVIRUS IN THE COMMUNITY    GENERAL:  The patient sounds well and is in no acute distress.  RESP: Breathing comfortably and unlabored speech.    NEURO: A&Ox4  PSYCH: Normal affect    Labs:  Results for orders placed or performed in visit on 09/05/18   Type and Screen   Result Value Ref Range    Blood Type B NEG     Antibody Screen NEG    CBC w/ Differential   Result Value Ref Range    WBC 1.6 (L) 4.5 - 11.0 10*9/L    RBC 4.19 4.00 - 5.20 10*12/L    HGB 12.4 (L) 13.5 - 16.0 g/dL    HCT 56.2 13.0 - 86.5 %    MCV 86.1 80.0 - 100.0 fL    MCH 29.6 26.0 - 34.0 pg    MCHC 34.4 31.0 - 37.0 g/dL    RDW 78.4 (H) 69.6 - 15.0 %    MPV 10.4 (H) 7.0 - 10.0 fL    Platelet 67 (L) 150 - 440 10*9/L    Variable HGB Concentration Slight (A) Not Present    Neutrophils % 28.0 %    Lymphocytes % 47.4 %    Monocytes % 14.3 %    Eosinophils % 4.9 %    Basophils % 1.4 %    Absolute Neutrophils 0.5 (L) 2.0 - 7.5 10*9/L    Absolute Lymphocytes 0.8 (L) 1.5 - 5.0 10*9/L    Absolute Monocytes 0.2 0.2 - 0.8 10*9/L    Absolute Eosinophils 0.1 0.0 - 0.4 10*9/L    Absolute Basophils 0.0 0.0 - 0.1 10*9/L    Large Unstained Cells 4 0 - 4 %

## 2018-09-17 NOTE — Unmapped (Signed)
Addended by: Duffy Rhody F on: 09/17/2018 11:01 AM     Modules accepted: Orders

## 2018-09-30 NOTE — Unmapped (Signed)
09/30/18    Travel Screening Questions Completed.    Travel Screening Questions/Answers:  1). Have you traveled within the last 14 days?: No  2). Do you have any of the following symptoms that are new or worsening: cough, shortness of breath, loss of taste/smell, sore throat, fever or feeling feverish, repeated shaking chills, muscle pain, vomiting, or diarrhea?: No  3). Have you had close contact with a person with confirmed COVID-19 in the last 21 days before symptoms began?: No  4). Have you tested positive in the last 21 days for COVID-19?: No      Negative Travel Screen: Patient answered NO to questions 2, 3, and 4. Offer Telephone visit, Virtual Visit or In Person Visit according to the current scheduling protocol for your clinic.       The call was handled in the following manner: Scheduled for In Person Visit     Informed patient that only 1 guest will be allowed to accompanied them to their scheduled appointment. No one under the age of 64 is allowed to come into the building. Everyone needs to wear a mask. If you don't have a mask, a mask will be provided and needs to been worn during the entire visit. Pt verbalized understanding and had no additional questions at this time.

## 2018-10-01 ENCOUNTER — Ambulatory Visit: Admit: 2018-10-01 | Discharge: 2018-10-01 | Payer: MEDICARE

## 2018-10-01 ENCOUNTER — Other Ambulatory Visit: Admit: 2018-10-01 | Discharge: 2018-10-01 | Payer: MEDICARE

## 2018-10-01 ENCOUNTER — Institutional Professional Consult (permissible substitution): Admit: 2018-10-01 | Discharge: 2018-10-01 | Payer: MEDICARE

## 2018-10-01 ENCOUNTER — Institutional Professional Consult (permissible substitution): Admit: 2018-10-01 | Discharge: 2018-10-01 | Payer: MEDICARE | Attending: Adult Health | Primary: Adult Health

## 2018-10-01 DIAGNOSIS — D701 Agranulocytosis secondary to cancer chemotherapy: Principal | ICD-10-CM

## 2018-10-01 DIAGNOSIS — C9110 Chronic lymphocytic leukemia of B-cell type not having achieved remission: Principal | ICD-10-CM

## 2018-10-01 DIAGNOSIS — T451X5A Adverse effect of antineoplastic and immunosuppressive drugs, initial encounter: Secondary | ICD-10-CM

## 2018-10-01 DIAGNOSIS — D6959 Other secondary thrombocytopenia: Secondary | ICD-10-CM

## 2018-10-01 LAB — CBC W/ AUTO DIFF
BASOPHILS ABSOLUTE COUNT: 0 10*9/L (ref 0.0–0.1)
BASOPHILS RELATIVE PERCENT: 0.5 %
EOSINOPHILS ABSOLUTE COUNT: 0.1 10*9/L (ref 0.0–0.4)
EOSINOPHILS RELATIVE PERCENT: 4.7 %
HEMOGLOBIN: 13.4 g/dL — ABNORMAL LOW (ref 13.5–16.0)
LARGE UNSTAINED CELLS: 3 % (ref 0–4)
LYMPHOCYTES ABSOLUTE COUNT: 1 10*9/L — ABNORMAL LOW (ref 1.5–5.0)
LYMPHOCYTES RELATIVE PERCENT: 31.7 %
MEAN CORPUSCULAR HEMOGLOBIN CONC: 35.3 g/dL (ref 31.0–37.0)
MEAN CORPUSCULAR HEMOGLOBIN: 30.8 pg (ref 26.0–34.0)
MEAN CORPUSCULAR VOLUME: 87.2 fL (ref 80.0–100.0)
MEAN PLATELET VOLUME: 9.4 fL (ref 7.0–10.0)
MONOCYTES ABSOLUTE COUNT: 0.3 10*9/L (ref 0.2–0.8)
MONOCYTES RELATIVE PERCENT: 8.6 %
NEUTROPHILS ABSOLUTE COUNT: 1.5 10*9/L — ABNORMAL LOW (ref 2.0–7.5)
PLATELET COUNT: 36 10*9/L — ABNORMAL LOW (ref 150–440)
RED BLOOD CELL COUNT: 4.34 10*12/L (ref 4.00–5.20)
RED CELL DISTRIBUTION WIDTH: 16.8 % — ABNORMAL HIGH (ref 12.0–15.0)
WBC ADJUSTED: 3 10*9/L — ABNORMAL LOW (ref 4.5–11.0)

## 2018-10-01 LAB — COMPREHENSIVE METABOLIC PANEL
ALBUMIN: 4.1 g/dL (ref 3.5–5.0)
ALT (SGPT): 17 U/L (ref <35)
ANION GAP: 10 mmol/L (ref 7–15)
AST (SGOT): 25 U/L (ref 14–38)
BILIRUBIN TOTAL: 0.9 mg/dL (ref 0.0–1.2)
BLOOD UREA NITROGEN: 15 mg/dL (ref 7–21)
BUN / CREAT RATIO: 20
CALCIUM: 9.2 mg/dL (ref 8.5–10.2)
CHLORIDE: 108 mmol/L — ABNORMAL HIGH (ref 98–107)
CO2: 23 mmol/L (ref 22.0–30.0)
CREATININE: 0.75 mg/dL (ref 0.60–1.00)
EGFR CKD-EPI AA FEMALE: 88 mL/min/{1.73_m2} (ref >=60)
EGFR CKD-EPI NON-AA FEMALE: 76 mL/min/{1.73_m2} (ref >=60)
GLUCOSE RANDOM: 118 mg/dL (ref 70–179)
POTASSIUM: 4.2 mmol/L (ref 3.5–5.0)
PROTEIN TOTAL: 6.5 g/dL (ref 6.5–8.3)

## 2018-10-01 LAB — URIC ACID: Urate:MCnc:Pt:Ser/Plas:Qn:: 6.2

## 2018-10-01 LAB — LYMPHOCYTES RELATIVE PERCENT: Lab: 31.7

## 2018-10-01 LAB — PHOSPHORUS
PHOSPHORUS: 4.3 mg/dL (ref 2.9–4.7)
Phosphate:MCnc:Pt:Ser/Plas:Qn:: 4.3

## 2018-10-01 LAB — ANION GAP: Anion gap 3:SCnc:Pt:Ser/Plas:Qn:: 10

## 2018-10-01 NOTE — Unmapped (Signed)
Pt wanting to know if it is possible for her to go ahead and have a root canal because a crown has come off one of her molars.

## 2018-10-01 NOTE — Unmapped (Signed)
See previous note from Nurse visit encounter.

## 2018-10-01 NOTE — Unmapped (Signed)
[facility] Telephone Visit    Patient Name: Katrina Gould  Patient Age: 78 y.o.  Encounter Date: 10/01/2018      This patient visit was completed through the use of an audio/video or telephone encounter.    I spent 15 minutes on the phone with the patient. I spent an additional 10 minutes on pre- and post-visit activities.     The patient was physically located in [place] or a state in which I am permitted to provide care. The patient understood that s/he may incur co-pays and cost sharing, and agreed to the telemedicine visit. The visit was completed via phone and/or video, which was appropriate and reasonable under the circumstances given the patient's presentation at the time.    The patient has been advised of the potential risks and limitations of this mode of treatment (including, but not limited to, the absence of in-person examination) and has agreed to be treated using telemedicine. The patient's/patient's family's questions regarding telemedicine have been answered.     If the phone/video visit was completed in an ambulatory setting, the patient has also been advised to contact their provider???s office for worsening conditions, and seek emergency medical treatment and/or call 911 if the patient deems either necessary.    Visit conducted by: Telephone  Person contacted: [name] phone number: [phone]  Is there someone else in the room? no         Primary Care Provider:  Lauro Regulus    Referring Physician:  Lauro Regulus    REASON FOR VISIT:   78 y.o. female referred by Lauro Regulus in consultation for evaluation of CLL.    ASSESSMENT/PLAN   Katrina Gould is a 78 y.o. female who presents for follow of her CLL/SLL, Rai stage 4. Disease markers: 46,XX,der(4)t(4;4)(p14;q21). IGHV unmutated (0%, 3-33*01). B60m 5.05. No TP53 mutation. High-risk by CLL-IPI (6 points).  ??  [name] is doing well overall.  Her platelets have dropped to 36k today. ANC is 1.5, so no need for Granix today.  It is concerning that her platelets have dropped - she has not missed any doses of eltrombopag.      Overall she feels well.  Will recheck her labs in 2 weeks.  If still thrombocytopenia, should do a bone marrow biopsy to assess marrow.  Still not on venetoclax.    ??  ??  Plans and Recommendations:  1. CLL, Rai 4   - continue to hold venetoclax  - if platelets not back up to >50 or 60k, then BMBx  - Continue eltrombopag 75 mg PO daily  - RTC in 2 weeks (labs and phone visit)   ??  2. Arthritis  - continues to use topical CBD oil and this has helped  - May continue prn tylenol but should avoid NSAIDs  - knows to avoid oral CBD oil  ??  3. Health Maintenance: Up to date on 2019 influenza vaccine  - continue annual dermatology follow up  - 1st Shingrix vaccine was given 06/2017; patient will need second vaccine, but previously recommended that she get this done at a pharmacy or at her PCP's office for insurance purposes        INTERVAL HISTORY   Doing fair.  Feeling draggy the past few days.  She had been doing a lot in the yard recently.  She says she sleeps really well. Bowels are moving well.  Still having urinary incontinence. Needs to see  urology.  She had a tooth pulled last week and had no bleeding or complications.    Otherwise, denies new bone pain, fevers, chills, night sweats, lumps/bumps, tongue swelling, shortness of breath, syncope, lightheadedness, constipation or diarrhea, nausea or vomiting, very easy bruising or bleeding, or urinary changes.       HEMATOLOGICAL / ONCOLOGICAL HISTORY:     Oncology History    CLL    First seen in oncology clinic locally 03/31/16    Had a left axillary biopsy 03/15/16 that confirmed CLL/SLL: Flow showed monoclonal B-cell population with CD5, CD23, kappa light chain. IHC showed positive CD20 (diffuse), negative for cyclin D1.    No constitutional symptoms except some sweats    Labs: Hb 12.0, Hct 34.8, plt 147. WBC was 10.6 with ANC 2.5. ALC was 7.5K.    CLL FISH studies: normal panel. No cells with 11q, tri 12, 17p or 13q, and the 11;14 translocation also not detected.    Karyotype: 46,XX,der(4)t(4;4)(p14;q21)[3]/45,XX,dic(6;17)(q12;p11.2) (no loss of TP53 on FISH)    Molecular: No TP53 mutation    CLL-IPI: As per Lancet Oncol Vol 15 October 2014  Includes (1) TP53 status (no abnormalities vs. 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 MRD = 0.32%.    05/15/18: Repeated BM bx given persistent low counts - ~1% CLL in otherwise normal appearing marrow. Myeloid mutation panel negative. Karyotype: 45,XX,dic(6;17)(q12;p11.2)[2]/46,XX[18].          CLL (chronic lymphocytic leukemia) (CMS-HCC)    03/15/2016 Initial Diagnosis     CLL (chronic lymphocytic leukemia) (RAF-HCC)      09/14/2017 - 10/05/2017 Chemotherapy     Rituximab weekly x4  No response      10/23/2017 -  Chemotherapy     Began ven/obi      03/19/2018 Adverse Reaction     Obinutuzumab held given persistent thrombocytopenia. Marrow performed on 03/22/18, patient with no overt CLL (0.32% by MRD flow testing from [facility]). Do not plan to give 6th cycle of obi (unless counts recover in a reasonable timeframe).      04/09/2018 Adverse Reaction     ANC down to 0.2. Held venetoclax (was previously at 200 mg daily). B-cell chronic lymphocytic leukemia (CMS-HCC)    09/05/2017 - 10/11/2017 Chemotherapy     Chemotherapy Treatment    Treatment Goal Control   Line of Treatment [No plan line of treatment]   Plan Name OP RITUXIMAB    Start Date 09/14/2017   End Date 10/05/2017   Provider Pernell Dupre, MD   Chemotherapy riTUXimab (RITUXAN) 712.5 mg in sodium chloride (NS) 0.9 % 500 mL IVPB, 375 mg/m2 = 712.5 mg,  Intravenous, Once, 1 of 1 cycle  Administration: 712.5 mg (09/14/2017)  riTUXimab (RITUXAN) 712.5 mg in sodium chloride (NS) 0.9 % 250 mL rapid infusion, 375 mg/m2 = 712.5 mg, Intravenous, Once, 1 of 1 cycle  Administration: 712.5 mg (09/21/2017), 712.5 mg (09/28/2017), 712.5 mg (10/05/2017)         10/16/2017 Initial Diagnosis     Chronic lymphocytic leukemia, Rai stage IV (CMS-HCC)      10/23/2017 -  Chemotherapy     OP OBINUTUZUMAB AND VENETOCLAX  chlorambucil 0.5 mg/kg PO on days 1, 15, obinutuzumab 100 mg IV on day 1, obinutuzumab 900 mg IV on day 2, obinutuzumab 1,000 mg IV on days 8,15 on Cycle 1, then obinutuzumab 1,000 mg IV on day 1 for sequent cycles, every 28 days.              OTHER PAST MEDICAL HISTORY:     Patient Active Problem List   Diagnosis   ??? CLL (chronic lymphocytic leukemia) (CMS-HCC)   ??? Chronic migraine without aura   ??? 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 Left bundle branch block (LBBB)   ??? Hypercholesterolemia   ??? Chest pain, unspecified   ??? GERD (gastroesophageal reflux disease)   ??? Osteoporosis   ??? Needs flu shot   ??? BRBPR (bright red blood per rectum)   ??? Other autoimmune hemolytic anemias (CMS-HCC)    ??? B-cell chronic lymphocytic leukemia (CMS-HCC)   ??? Acute diarrhea   ??? Aortic atherosclerosis (CMS-HCC)   ??? Diverticulitis of colon   ??? Epigastric pain   ??? Gastritis, Helicobacter pylori   ??? H/O adenomatous polyp of colon   ??? Healthcare maintenance   ??? LLQ pain   ??? Lymphadenopathy   ??? Migraine without aura and without status migrainosus, not intractable   ??? Syncope and collapse   ??? White matter disease   ??? Vasovagal syncope   ??? Chemotherapy-induced neutropenia (CMS-HCC)   ??? Coccyx pain   ??? Chemotherapy induced diarrhea   ??? Chronic right shoulder pain   ??? Chemotherapy-induced thrombocytopenia       ALLERGIES:     Baclofen    MEDICATIONS:       Outpatient Encounter Medications as of 10/01/2018   Medication Sig Dispense Refill   ??? eltrombopag (PROMACTA) 25 MG tablet Take 3 tablets (75 mg total) by mouth daily. Administer on an empty stomach, 1 hour before or 2 hours after a meal. 90 tablet 2   ??? fluticasone propion-salmeterol (ADVAIR) 100-50 mcg/dose diskus Inhale 2 puffs daily.      ??? lidocaine-me.sal-menthol-camph (CBD-KINGS WITH LIDOCAINE) 4-9-1-1.2 % PtMd Apply 1 Dose topically Four (4) times a day.     ??? loperamide (IMODIUM A-D) 2 mg tablet Take 2 mg by mouth Three (3) times a day as needed for diarrhea.     ??? LORazepam (ATIVAN) 0.5 MG tablet Take 0.5 mg by mouth two (2) times a day as needed.      ??? mirtazapine (REMERON) 7.5 MG tablet Take 7.5 mg by mouth nightly.     ??? multivitamin (TAB-A-VITE/THERAGRAN) per tablet Take 1 tablet by mouth daily.      ??? oxyCODONE (ROXICODONE) 5 MG immediate release tablet Take 1 tablet (5 mg total) by mouth every eight (8) hours as needed for pain. 20 tablet 0   ??? pantoprazole (PROTONIX) 40 MG tablet Take by mouth daily.      ??? pravastatin (PRAVACHOL) 80 MG tablet TAKE 1 TABLET BY MOUTH EVERY NIGHT  AT BEDTIME     ??? sertraline (ZOLOFT) 50 MG tablet Take 50 mg by mouth daily.  1   ??? tiZANidine (ZANAFLEX) 2 MG tablet Take 2 mg by mouth Three (3) times a day as needed.     ??? topiramate (TOPAMAX) 50 MG tablet Take 50 mg by mouth daily.  3     Facility-Administered Encounter Medications as of 10/01/2018   Medication Dose Route Frequency Provider Last Rate Last Dose   ??? tbo-filgrastim (GRANIX) injection 480 mcg  480 mcg Subcutaneous Once Vernie Murders, [place]           Physical Exam:  LIMITED EXAM DUE TO ATTEMPT TO LIMIT CONTACT DUE TO CORONAVIRUS IN THE COMMUNITY    GENERAL:  The patient sounds well and is in no acute distress.  RESP: Breathing comfortably and unlabored speech.    NEURO: A&Ox4  PSYCH: Normal affect    Labs:  Results for orders placed or performed in visit on 09/17/18   Comprehensive Metabolic Panel   Result Value Ref Range    Sodium 142 135 - 145 mmol/L    Potassium 4.6 3.5 - 5.0 mmol/L    Chloride 106 98 - 107 mmol/L    Anion Gap 11 7 - 15 mmol/L    CO2 25.0 22.0 - 30.0 mmol/L    BUN 17 7 - 21 mg/dL    Creatinine 4.54 0.98 - 1.00 mg/dL    BUN/Creatinine Ratio 24     EGFR CKD-EPI Non-African American, Female 81 >=60 mL/min/1.57m2    EGFR CKD-EPI African American, Female >90 >=60 mL/min/1.30m2    Glucose 100 70 - 179 mg/dL    Calcium 9.1 8.5 - 11.9 mg/dL    Albumin 4.0 3.5 - 5.0 g/dL    Total Protein 6.3 (L) 6.5 - 8.3 g/dL    Total Bilirubin 0.6 0.0 - 1.2 mg/dL    AST 21 14 - 38 U/L    ALT 15 <35 U/L    Alkaline Phosphatase 92 38 - 126 U/L   Type and Screen   Result Value Ref Range    Blood Type B NEG     Antibody Screen NEG    CBC w/ Differential   Result Value Ref Range    WBC 4.6 4.5 - 11.0 10*9/L    RBC 4.41 4.00 - 5.20 10*12/L    HGB 13.1 (L) 13.5 - 16.0 g/dL    HCT 14.7 82.9 - 56.2 %    MCV 86.3 80.0 - 100.0 fL    MCH 29.8 26.0 - 34.0 pg    MCHC 34.5 31.0 - 37.0 g/dL    RDW 13.0 (H) 86.5 - 15.0 %    MPV 9.1 7.0 - 10.0 fL    Platelet 66 (L) 150 - 440 10*9/L    Neutrophils % 66.9 %    Lymphocytes % 22.1 %    Monocytes % 4.5 %    Eosinophils % 4.0 %    Basophils % 0.3 %    Absolute Neutrophils 3.0 2.0 - 7.5 10*9/L    Absolute Lymphocytes 1.0 (L) 1.5 - 5.0 10*9/L    Absolute Monocytes 0.2 0.2 - 0.8 10*9/L    Absolute Eosinophils 0.2 0.0 - 0.4 10*9/L    Absolute Basophils 0.0 0.0 - 0.1 10*9/L    Large Unstained Cells 2 0 - 4 %    Anisocytosis Slight (A) Not Present

## 2018-10-01 NOTE — Unmapped (Signed)
Accessed patient's port with a 20g 3/4 inch needle.  Obtained labs and flushed port with 20ml of normal saline and 5ml of heparin. Patient's port was deaccessed prior to leaving clinic.      Patient did not receive Granix today due to ANC 1.5.

## 2018-10-06 NOTE — Unmapped (Signed)
Left message for patient with time and date with infusion appointment on 10/29/18

## 2018-10-08 NOTE — Unmapped (Signed)
Patient Katrina Gould was contacted today regarding upcoming appts. Voicemail was left for patient with information to call back.     Thank you,  Samella Parr  [facility]  [phone]

## 2018-10-15 NOTE — Unmapped (Signed)
Contacted Ms. Winterrowd per Markus Jarvis to advise that Kriste Basque was unable to get her scheduled at [facility] today due to the lateness of the day, but that Kriste Basque will contact [facility] first thing in the morning to have Ms. Dismore added to their schedule. Advised Ms. Belfield that she would be notified of the time of her appointments once confirmed. Pt verbalized understanding and expressed appreciation for the call.

## 2018-10-16 ENCOUNTER — Institutional Professional Consult (permissible substitution): Admit: 2018-10-16 | Discharge: 2018-10-16 | Payer: MEDICARE

## 2018-10-16 ENCOUNTER — Ambulatory Visit: Admit: 2018-10-16 | Discharge: 2018-10-16 | Payer: MEDICARE

## 2018-10-16 DIAGNOSIS — D701 Agranulocytosis secondary to cancer chemotherapy: Secondary | ICD-10-CM

## 2018-10-16 DIAGNOSIS — T451X5A Adverse effect of antineoplastic and immunosuppressive drugs, initial encounter: Secondary | ICD-10-CM

## 2018-10-16 DIAGNOSIS — C9101 Acute lymphoblastic leukemia, in remission: Principal | ICD-10-CM

## 2018-10-16 DIAGNOSIS — C9110 Chronic lymphocytic leukemia of B-cell type not having achieved remission: Secondary | ICD-10-CM

## 2018-10-16 LAB — CBC W/ AUTO DIFF
BASOPHILS ABSOLUTE COUNT: 0 10*9/L (ref 0.0–0.1)
BASOPHILS RELATIVE PERCENT: 0.6 %
EOSINOPHILS ABSOLUTE COUNT: 0.1 10*9/L (ref 0.0–0.4)
EOSINOPHILS RELATIVE PERCENT: 3.9 %
HEMATOCRIT: 38.8 % (ref 36.0–46.0)
HEMOGLOBIN: 13.4 g/dL — ABNORMAL LOW (ref 13.5–16.0)
LARGE UNSTAINED CELLS: 4 % (ref 0–4)
LYMPHOCYTES ABSOLUTE COUNT: 1.1 10*9/L — ABNORMAL LOW (ref 1.5–5.0)
LYMPHOCYTES RELATIVE PERCENT: 40.1 %
MEAN CORPUSCULAR HEMOGLOBIN CONC: 34.5 g/dL (ref 31.0–37.0)
MEAN CORPUSCULAR HEMOGLOBIN: 30 pg (ref 26.0–34.0)
MEAN PLATELET VOLUME: 9.2 fL (ref 7.0–10.0)
MONOCYTES ABSOLUTE COUNT: 0.3 10*9/L (ref 0.2–0.8)
MONOCYTES RELATIVE PERCENT: 10 %
NEUTROPHILS RELATIVE PERCENT: 40.9 %
RED BLOOD CELL COUNT: 4.47 10*12/L (ref 4.00–5.20)
RED CELL DISTRIBUTION WIDTH: 16.3 % — ABNORMAL HIGH (ref 12.0–15.0)
WBC ADJUSTED: 2.8 10*9/L — ABNORMAL LOW (ref 4.5–11.0)

## 2018-10-16 LAB — NEUTROPHILS RELATIVE PERCENT: Lab: 40.9

## 2018-10-16 NOTE — Unmapped (Signed)
Labs drawn via [place].    Port flushed and brisk blood return     Pt tolerated well.    Results reviewed - WNL.    Interventions - no intervention needed.    Pt stable and ambulated independently from clinic.

## 2018-10-29 ENCOUNTER — Ambulatory Visit: Admit: 2018-10-29 | Discharge: 2018-10-30 | Payer: MEDICARE

## 2018-10-29 ENCOUNTER — Institutional Professional Consult (permissible substitution): Admit: 2018-10-29 | Discharge: 2018-10-30 | Payer: MEDICARE

## 2018-10-29 ENCOUNTER — Institutional Professional Consult (permissible substitution): Admit: 2018-10-29 | Discharge: 2018-10-30 | Payer: MEDICARE | Attending: Adult Health | Primary: Adult Health

## 2018-10-29 DIAGNOSIS — C9110 Chronic lymphocytic leukemia of B-cell type not having achieved remission: Secondary | ICD-10-CM

## 2018-10-29 DIAGNOSIS — T451X5A Adverse effect of antineoplastic and immunosuppressive drugs, initial encounter: Secondary | ICD-10-CM

## 2018-10-29 DIAGNOSIS — D701 Agranulocytosis secondary to cancer chemotherapy: Secondary | ICD-10-CM

## 2018-10-29 DIAGNOSIS — R5383 Other fatigue: Principal | ICD-10-CM

## 2018-10-29 DIAGNOSIS — D6959 Other secondary thrombocytopenia: Secondary | ICD-10-CM

## 2018-10-29 LAB — CBC W/ AUTO DIFF
BASOPHILS ABSOLUTE COUNT: 0 10*9/L (ref 0.0–0.1)
BASOPHILS RELATIVE PERCENT: 0.8 %
EOSINOPHILS RELATIVE PERCENT: 2.5 %
HEMATOCRIT: 38 % (ref 36.0–46.0)
HEMOGLOBIN: 13.5 g/dL (ref 13.5–16.0)
LARGE UNSTAINED CELLS: 9 % — ABNORMAL HIGH (ref 0–4)
LYMPHOCYTES ABSOLUTE COUNT: 0.9 10*9/L — ABNORMAL LOW (ref 1.5–5.0)
LYMPHOCYTES RELATIVE PERCENT: 63.5 %
MEAN CORPUSCULAR HEMOGLOBIN CONC: 35.5 g/dL (ref 31.0–37.0)
MEAN CORPUSCULAR VOLUME: 85.5 fL (ref 80.0–100.0)
MEAN PLATELET VOLUME: 10.5 fL — ABNORMAL HIGH (ref 7.0–10.0)
MONOCYTES RELATIVE PERCENT: 15.3 %
NEUTROPHILS ABSOLUTE COUNT: 0.1 10*9/L — CL (ref 2.0–7.5)
NEUTROPHILS RELATIVE PERCENT: 8.5 %
PLATELET COUNT: 42 10*9/L — ABNORMAL LOW (ref 150–440)
RED BLOOD CELL COUNT: 4.44 10*12/L (ref 4.00–5.20)
RED CELL DISTRIBUTION WIDTH: 16 % — ABNORMAL HIGH (ref 12.0–15.0)
WBC ADJUSTED: 1.4 10*9/L — ABNORMAL LOW (ref 4.5–11.0)

## 2018-10-29 LAB — COMPREHENSIVE METABOLIC PANEL
ALBUMIN: 3.8 g/dL (ref 3.5–5.0)
ALKALINE PHOSPHATASE: 80 U/L (ref 38–126)
ALT (SGPT): 18 U/L (ref <35)
ANION GAP: 6 mmol/L — ABNORMAL LOW (ref 7–15)
AST (SGOT): 27 U/L (ref 14–38)
BILIRUBIN TOTAL: 0.6 mg/dL (ref 0.0–1.2)
BLOOD UREA NITROGEN: 20 mg/dL (ref 7–21)
BUN / CREAT RATIO: 28
CALCIUM: 9.1 mg/dL (ref 8.5–10.2)
CHLORIDE: 108 mmol/L — ABNORMAL HIGH (ref 98–107)
CO2: 24 mmol/L (ref 22.0–30.0)
CREATININE: 0.72 mg/dL (ref 0.60–1.00)
EGFR CKD-EPI NON-AA FEMALE: 80 mL/min/{1.73_m2} (ref >=60)
GLUCOSE RANDOM: 99 mg/dL (ref 70–179)
POTASSIUM: 4.5 mmol/L (ref 3.5–5.0)
PROTEIN TOTAL: 6.2 g/dL — ABNORMAL LOW (ref 6.5–8.3)

## 2018-10-29 LAB — THYROID STIMULATING HORMONE: Thyrotropin:ACnc:Pt:Ser/Plas:Qn:: 2.044

## 2018-10-29 LAB — FREE T4: Thyroxine.free:MCnc:Pt:Ser/Plas:Qn:: 0.76

## 2018-10-29 LAB — LARGE UNSTAINED CELLS: Lab: 9 — ABNORMAL HIGH

## 2018-10-29 LAB — ALKALINE PHOSPHATASE: Alkaline phosphatase:CCnc:Pt:Ser/Plas:Qn:: 80

## 2018-10-29 LAB — SMEAR REVIEW

## 2018-10-29 NOTE — Unmapped (Signed)
Pt states Granix was not delivered yesterday and she is hoping it will be delivered today otherwise she will be completely out.

## 2018-10-29 NOTE — Unmapped (Signed)
[facility] Telephone Visit    Patient Name: Katrina Gould  Patient Age: 78 y.o.  Encounter Date: 10/29/2018      This patient visit was completed through the use of an audio/video or telephone encounter.    I spent 15 minutes on the phone with the patient. I spent an additional 10 minutes on pre- and post-visit activities.     The patient was physically located in [place] or a state in which I am permitted to provide care. The patient understood that s/he may incur co-pays and cost sharing, and agreed to the telemedicine visit. The visit was completed via phone and/or video, which was appropriate and reasonable under the circumstances given the patient's presentation at the time.    The patient has been advised of the potential risks and limitations of this mode of treatment (including, but not limited to, the absence of in-person examination) and has agreed to be treated using telemedicine. The patient's/patient's family's questions regarding telemedicine have been answered.     If the phone/video visit was completed in an ambulatory setting, the patient has also been advised to contact their provider???s office for worsening conditions, and seek emergency medical treatment and/or call 911 if the patient deems either necessary.    Visit conducted by: Telephone  Person contacted: [name] phone number: [phone]  Is there someone else in the room? no         Primary Care Provider:  Lauro Regulus    Referring Physician:  Lauro Regulus    REASON FOR VISIT:   78 y.o. female referred by Lauro Regulus in consultation for evaluation of CLL.    ASSESSMENT/PLAN   Katrina Gould is a 78 y.o. female who presents for follow of her CLL/SLL, Rai stage 4. Disease markers: 46,XX,der(4)t(4;4)(p14;q21). IGHV unmutated (0%, 3-33*01). B108m 5.05. No TP53 mutation. High-risk by CLL-IPI (6 points).  ??  Katrina Gould is doing fair.  She is very tired.  Hgb WNL.  Added on TSH/FT4. Continues to have baseline vertigo.    ANC today is 0.1.  She was 1.2 two weeks ago, and did not meet parameters for Granix.  She got that today.  Platelets are 42k, which is stable from previous.  She is out of eltrombopag, encouraged her to call the pharmacy on the bottle to get her refill.    Given continued cytopenias, will move forward with bone marrow biopsy.    Plans and Recommendations:  1. CLL, Rai 4   - continue to hold venetoclax  - plan for BMBx next available  - got Granix today for ANC 0.1  - Continue eltrombopag 75 mg PO daily  - RTC 1 week after marrow  ??  2. Arthritis  - continues to use topical CBD oil and this has helped  - May continue prn tylenol but should avoid NSAIDs  - knows to avoid oral CBD oil  ??  3. Health Maintenance: Up to date on 2019 influenza vaccine  - continue annual dermatology follow up  - 1st Shingrix vaccine was given 06/2017; patient will need second vaccine, but previously recommended that she get this done at a pharmacy or at her PCP's office for insurance purposes        INTERVAL HISTORY   Doing fair. Still feeling very tired. Still feeling dizzy (prior to diagnosis).      Otherwise, denies new bone pain, fevers, chills, night sweats, lumps/bumps, tongue swelling, shortness of breath,  syncope, lightheadedness, constipation or diarrhea, nausea or vomiting, very easy bruising or bleeding, or urinary changes.       HEMATOLOGICAL / ONCOLOGICAL HISTORY:     Oncology History    CLL    First seen in oncology clinic locally 03/31/16    Had a left axillary biopsy 03/15/16 that confirmed CLL/SLL: Flow showed monoclonal B-cell population with CD5, CD23, kappa light chain. IHC showed positive CD20 (diffuse), negative for cyclin D1.    No constitutional symptoms except some sweats    Labs: Hb 12.0, Hct 34.8, plt 147. WBC was 10.6 with ANC 2.5. ALC was 7.5K.    CLL FISH studies: normal panel. No cells with 11q, tri 12, 17p or 13q, and the 11;14 translocation also not detected. Karyotype: 46,XX,der(4)t(4;4)(p14;q21)[3]/45,XX,dic(6;17)(q12;p11.2) (no loss of TP53 on FISH)    Molecular: No TP53 mutation    CLL-IPI: As per Lancet Oncol Vol 15 October 2014  Includes (1) TP53 status (no abnormalities vs. 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 MRD = 0.32%.    05/15/18: Repeated BM bx given persistent low counts - ~1% CLL in otherwise normal appearing marrow. Myeloid mutation panel negative. Karyotype: 45,XX,dic(6;17)(q12;p11.2)[2]/46,XX[18].          CLL (chronic lymphocytic leukemia) (CMS-HCC)    03/15/2016 Initial Diagnosis     CLL (chronic lymphocytic leukemia) (RAF-HCC)      09/14/2017 - 10/05/2017 Chemotherapy     Rituximab weekly x4  No response      10/23/2017 -  Chemotherapy     Began ven/obi      03/19/2018 Adverse Reaction     Obinutuzumab held given persistent thrombocytopenia. Marrow performed on 03/22/18, patient with no overt CLL (0.32% by MRD flow testing from [facility]). Do not plan to give 6th cycle of obi (unless counts recover in a reasonable timeframe).      04/09/2018 Adverse Reaction     ANC down to 0.2. Held venetoclax (was previously at 200 mg daily).        B-cell chronic lymphocytic leukemia (CMS-HCC)    09/05/2017 - 10/11/2017 Chemotherapy     Chemotherapy Treatment    Treatment Goal Control Line of Treatment [No plan line of treatment]   Plan Name OP RITUXIMAB    Start Date 09/14/2017   End Date 10/05/2017   Provider Pernell Dupre, MD   Chemotherapy riTUXimab (RITUXAN) 712.5 mg in sodium chloride (NS) 0.9 % 500 mL IVPB, 375 mg/m2 = 712.5 mg, Intravenous, Once, 1 of 1 cycle  Administration: 712.5 mg (09/14/2017)  riTUXimab (RITUXAN) 712.5 mg in sodium chloride (NS) 0.9 % 250 mL rapid infusion, 375 mg/m2 = 712.5 mg,  Intravenous, Once, 1 of 1 cycle  Administration: 712.5 mg (09/21/2017), 712.5 mg (09/28/2017), 712.5 mg (10/05/2017)         10/16/2017 Initial Diagnosis     Chronic lymphocytic leukemia, Rai stage IV (CMS-HCC)      10/23/2017 -  Chemotherapy     OP OBINUTUZUMAB AND VENETOCLAX  chlorambucil 0.5 mg/kg PO on days 1, 15, obinutuzumab 100 mg IV on day 1, obinutuzumab 900 mg IV on day 2, obinutuzumab 1,000 mg IV on days 8,15 on Cycle 1, then obinutuzumab 1,000 mg IV on day 1 for sequent cycles, every 28 days.              OTHER PAST MEDICAL HISTORY:     Patient Active Problem List   Diagnosis   ??? CLL (chronic lymphocytic leukemia) (CMS-HCC)   ??? Chronic migraine without aura   ??? 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 Left bundle branch block (LBBB)   ??? Hypercholesterolemia   ??? Chest pain, unspecified   ??? GERD (gastroesophageal reflux disease)   ??? Osteoporosis   ??? Needs flu shot   ??? BRBPR (bright red blood per rectum)   ??? Other autoimmune hemolytic anemias (CMS-HCC)    ??? B-cell chronic lymphocytic leukemia (CMS-HCC)   ??? Acute diarrhea   ??? Aortic atherosclerosis (CMS-HCC)   ??? Diverticulitis of colon   ??? Epigastric pain   ??? Gastritis, Helicobacter pylori   ??? H/O adenomatous polyp of colon   ??? Healthcare maintenance   ??? LLQ pain   ??? Lymphadenopathy   ??? Migraine without aura and without status migrainosus, not intractable   ??? Syncope and collapse   ??? White matter disease   ??? Vasovagal syncope   ??? Chemotherapy-induced neutropenia (CMS-HCC)   ??? Coccyx pain   ??? Chemotherapy induced diarrhea   ??? Chronic right shoulder pain   ??? Chemotherapy-induced thrombocytopenia       ALLERGIES:     Baclofen    MEDICATIONS:       Outpatient Encounter Medications as of 10/29/2018   Medication Sig Dispense Refill   ??? eltrombopag (PROMACTA) 25 MG tablet Take 3 tablets (75 mg total) by mouth daily. Administer on an empty stomach, 1 hour before or 2 hours after a meal. 90 tablet 2   ??? fluticasone propion-salmeterol (ADVAIR) 100-50 mcg/dose diskus Inhale 2 puffs daily.      ??? lidocaine-me.sal-menthol-camph (CBD-KINGS WITH LIDOCAINE) 4-9-1-1.2 % PtMd Apply 1 Dose topically Four (4) times a day.     ??? loperamide (IMODIUM A-D) 2 mg tablet Take 2 mg by mouth Three (3) times a day as needed for diarrhea.     ??? LORazepam (ATIVAN) 0.5 MG tablet Take 0.5 mg by mouth two (2) times a day as needed.      ??? mirtazapine (REMERON) 7.5 MG tablet Take 7.5 mg by mouth nightly.     ??? multivitamin (TAB-A-VITE/THERAGRAN) per tablet Take 1 tablet by mouth daily.      ??? oxyCODONE (ROXICODONE) 5 MG immediate release tablet Take 1 tablet (5 mg total) by mouth every eight (8) hours as needed for pain. 20 tablet 0   ??? pantoprazole (PROTONIX) 40 MG tablet Take by mouth daily.      ??? pravastatin (PRAVACHOL) 80 MG tablet TAKE 1 TABLET BY MOUTH EVERY NIGHT AT BEDTIME     ??? sertraline (ZOLOFT) 50 MG tablet Take 50 mg by mouth daily.  1   ??? tiZANidine (ZANAFLEX) 2 MG tablet Take 2 mg  by mouth Three (3) times a day as needed.     ??? topiramate (TOPAMAX) 50 MG tablet Take 50 mg by mouth daily.  3     Facility-Administered Encounter Medications as of 10/29/2018   Medication Dose Route Frequency Provider Last Rate Last Dose   ??? tbo-filgrastim (GRANIX) injection 480 mcg  480 mcg Subcutaneous Once Vernie Murders, [place]           Physical Exam:  LIMITED EXAM DUE TO ATTEMPT TO LIMIT CONTACT DUE TO CORONAVIRUS IN THE COMMUNITY    GENERAL:  The patient sounds well and is in no acute distress.  RESP: Breathing comfortably and unlabored speech. NEURO: A&Ox4  PSYCH: Normal affect    Labs:  Results for orders placed or performed in visit on 10/16/18   CBC w/ Differential   Result Value Ref Range    WBC 2.8 (L) 4.5 - 11.0 10*9/L    RBC 4.47 4.00 - 5.20 10*12/L    HGB 13.4 (L) 13.5 - 16.0 g/dL    HCT 16.1 09.6 - 04.5 %    MCV 86.8 80.0 - 100.0 fL    MCH 30.0 26.0 - 34.0 pg    MCHC 34.5 31.0 - 37.0 g/dL    RDW 40.9 (H) 81.1 - 15.0 %    MPV 9.2 7.0 - 10.0 fL    Platelet 46 (L) 150 - 440 10*9/L    Neutrophils % 40.9 %    Lymphocytes % 40.1 %    Monocytes % 10.0 %    Eosinophils % 3.9 %    Basophils % 0.6 %    Absolute Neutrophils 1.2 (L) 2.0 - 7.5 10*9/L    Absolute Lymphocytes 1.1 (L) 1.5 - 5.0 10*9/L    Absolute Monocytes 0.3 0.2 - 0.8 10*9/L    Absolute Eosinophils 0.1 0.0 - 0.4 10*9/L    Absolute Basophils 0.0 0.0 - 0.1 10*9/L    Large Unstained Cells 4 0 - 4 %    Anisocytosis Slight (A) Not Present

## 2018-11-08 NOTE — Unmapped (Signed)
Pt needs to have her driver's license renewed and would like to r/s her bmbx, infusion and labs on the 15th. Please contact her at [phone].  Thank you,   Deatra Ina  [facility]  [phone]

## 2018-11-11 NOTE — Unmapped (Signed)
Please see below. Thanks!

## 2018-11-12 NOTE — Unmapped (Signed)
Hi,     Patient contacted the Communication Center requesting to speak with the care team of Katrina Gould to discuss:    Would like to know why she needs to have another bone marrow biopsy.    Please contact patient at [phone].    Check Indicates criteria has been reviewed and confirmed with the patient:    [x]  Preferred Name   [x]  DOB and/or MR#  [x]  Preferred Contact Method  [x]  Phone Number(s)   []  MyChart     Thank you,   Laverna Peace  [facility]   [phone]

## 2018-11-12 NOTE — Unmapped (Signed)
[name] has been rescheduled for 7/22 bone marrow and 7/28 as phone visit with [facility]    Thanks

## 2018-11-13 NOTE — Unmapped (Signed)
[facility] Triage Note     Patient: Katrina Gould     Reason for call:  return call    Time call returned: 1154     Phone Assessment: Spoke with Katrina Gould.  She was calling because she thought her last BMB showed that her cancer was good. And regarding her low counts, she stated that she went to [facility] to get a second opinion last Thursday and her Neutrophils went from 0.1 to 0.7.   She stated her platelets went from 33 to 57?     Triage Recommendations: Will reach out to team with concern and call her back.     Patient Response: grateful     Outstanding tasks: Follow up with patient.     Patient Pharmacy has been verified and primary pharmacy has been marked as preferred

## 2018-11-15 NOTE — Unmapped (Signed)
Spoke with patient about scheduled port lab draw and injection on 7/21 in [place].  Patient confirmed appt details.    Thanks  CJ

## 2018-11-19 ENCOUNTER — Institutional Professional Consult (permissible substitution): Admit: 2018-11-19 | Discharge: 2018-11-19 | Payer: MEDICARE

## 2018-11-19 ENCOUNTER — Other Ambulatory Visit: Admit: 2018-11-19 | Discharge: 2018-11-19 | Payer: MEDICARE

## 2018-11-19 DIAGNOSIS — D701 Agranulocytosis secondary to cancer chemotherapy: Secondary | ICD-10-CM

## 2018-11-19 DIAGNOSIS — C9111 Chronic lymphocytic leukemia of B-cell type in remission: Principal | ICD-10-CM

## 2018-11-19 DIAGNOSIS — T451X5A Adverse effect of antineoplastic and immunosuppressive drugs, initial encounter: Secondary | ICD-10-CM

## 2018-11-19 DIAGNOSIS — C9110 Chronic lymphocytic leukemia of B-cell type not having achieved remission: Secondary | ICD-10-CM

## 2018-11-19 LAB — COMPREHENSIVE METABOLIC PANEL
ALBUMIN: 3.8 g/dL (ref 3.5–5.0)
ALKALINE PHOSPHATASE: 91 U/L (ref 38–126)
ALT (SGPT): 18 U/L (ref <35)
AST (SGOT): 20 U/L (ref 14–38)
BILIRUBIN TOTAL: 0.6 mg/dL (ref 0.0–1.2)
BLOOD UREA NITROGEN: 18 mg/dL (ref 7–21)
BUN / CREAT RATIO: 22
CALCIUM: 9.1 mg/dL (ref 8.5–10.2)
CHLORIDE: 109 mmol/L — ABNORMAL HIGH (ref 98–107)
CO2: 25 mmol/L (ref 22.0–30.0)
CREATININE: 0.81 mg/dL (ref 0.60–1.00)
EGFR CKD-EPI AA FEMALE: 79 mL/min/{1.73_m2} (ref >=60)
EGFR CKD-EPI NON-AA FEMALE: 69 mL/min/{1.73_m2} (ref >=60)
POTASSIUM: 4.2 mmol/L (ref 3.5–5.0)
PROTEIN TOTAL: 6 g/dL — ABNORMAL LOW (ref 6.5–8.3)
SODIUM: 140 mmol/L (ref 135–145)

## 2018-11-19 LAB — CBC W/ AUTO DIFF
BASOPHILS RELATIVE PERCENT: 0.4 %
EOSINOPHILS ABSOLUTE COUNT: 0.1 10*9/L (ref 0.0–0.4)
EOSINOPHILS RELATIVE PERCENT: 3.3 %
HEMOGLOBIN: 13.6 g/dL (ref 13.5–16.0)
LARGE UNSTAINED CELLS: 4 % (ref 0–4)
LYMPHOCYTES ABSOLUTE COUNT: 1.3 10*9/L — ABNORMAL LOW (ref 1.5–5.0)
LYMPHOCYTES RELATIVE PERCENT: 51.3 %
MEAN CORPUSCULAR HEMOGLOBIN CONC: 35.1 g/dL (ref 31.0–37.0)
MEAN CORPUSCULAR HEMOGLOBIN: 31.7 pg (ref 26.0–34.0)
MEAN CORPUSCULAR VOLUME: 90.3 fL (ref 80.0–100.0)
MEAN PLATELET VOLUME: 9.6 fL (ref 7.0–10.0)
MONOCYTES ABSOLUTE COUNT: 0.3 10*9/L (ref 0.2–0.8)
MONOCYTES RELATIVE PERCENT: 9.8 %
NEUTROPHILS ABSOLUTE COUNT: 0.8 10*9/L — ABNORMAL LOW (ref 2.0–7.5)
NEUTROPHILS RELATIVE PERCENT: 31.6 %
PLATELET COUNT: 51 10*9/L — ABNORMAL LOW (ref 150–440)
RED BLOOD CELL COUNT: 4.29 10*12/L (ref 4.00–5.20)
RED CELL DISTRIBUTION WIDTH: 16.9 % — ABNORMAL HIGH (ref 12.0–15.0)
WBC ADJUSTED: 2.5 10*9/L — ABNORMAL LOW (ref 4.5–11.0)

## 2018-11-19 LAB — ANION GAP: Anion gap 3:SCnc:Pt:Ser/Plas:Qn:: 6 — ABNORMAL LOW

## 2018-11-19 LAB — MEAN PLATELET VOLUME: Lab: 9.6

## 2018-11-19 NOTE — Unmapped (Signed)
Accessed patient's port with a 20g 3/4 inch needle.  Obtained labs and flushed port with 20ml of normal saline and 5ml of heparin. Patient's port was deaccessed prior to leaving clinic.     Patient received Granix SQ in left lower abd per therapy plan ok to give with ANC 0.8.  Patient tolerated injection without complications and ambulated to checkout without any assistance.

## 2018-11-26 ENCOUNTER — Ambulatory Visit: Admit: 2018-11-26 | Discharge: 2018-11-27 | Payer: MEDICARE | Attending: Adult Health | Primary: Adult Health

## 2018-11-26 NOTE — Unmapped (Signed)
I was able to get in touch with the patient. Patient stated she wasn't sure why she was having a phone appointment today. She stated her next lab drawn is scheduled to August 4th. Synetta Fail was made aware and will followup.

## 2018-11-27 NOTE — Unmapped (Signed)
I spoke with patient Katrina Gould to confirm appointments on the following date(s): appts scheduled for 8/4 in [place]    Samella Parr

## 2018-12-02 NOTE — Unmapped (Signed)
[facility] Telephone Visit    Patient Name: Katrina Gould  Patient Age: 78 y.o.  Encounter Date: 12/03/2018      This patient visit was completed through the use of an audio/video or telephone encounter.    I spent 15 minutes on the phone with the patient. I spent an additional 10 minutes on pre- and post-visit activities.     The patient was physically located in [place] or a state in which I am permitted to provide care. The patient understood that s/he may incur co-pays and cost sharing, and agreed to the telemedicine visit. The visit was completed via phone and/or video, which was appropriate and reasonable under the circumstances given the patient's presentation at the time.    The patient has been advised of the potential risks and limitations of this mode of treatment (including, but not limited to, the absence of in-person examination) and has agreed to be treated using telemedicine. The patient's/patient's family's questions regarding telemedicine have been answered.     If the phone/video visit was completed in an ambulatory setting, the patient has also been advised to contact their provider???s office for worsening conditions, and seek emergency medical treatment and/or call 911 if the patient deems either necessary.    Visit conducted by: Telephone  Person contacted: [name] phone number: [phone]  Is there someone else in the room? no         Primary Care Provider:  Lauro Regulus    Referring Physician:  Lauro Regulus    REASON FOR VISIT:   78 y.o. female referred by Lauro Regulus in consultation for evaluation of CLL.    ASSESSMENT/PLAN     Katrina Gould is a 78 y.o. female who presents for follow of her CLL/SLL, Rai stage 4. Disease markers: 46,XX,der(4)t(4;4)(p14;q21). IGHV unmutated (0%, 3-33*01). B51m 5.05. No TP53 mutation. High-risk by CLL-IPI (6 points).  The patient had a visit to [facility] for second opinion, with no new recommendations regarding therapy. She will continue her care here. She feels better than in prior visits, verbalizes no new complaints. Her fatigue is improved (and her TSH/T4 returned normal) Her baseline vertigo is still present.   Given continued cytopenias, a bone marrow biopsy was planned but patient prefers to continue the Grannix supportive therapy. Last dose was on 7/21, and her ANC today is at 2.3 with WBC 3.6  Platelets are also improved at 78    She continues to take  Eltrombopag, had missed a few doses till refilling it, but no bleeding issues had occurred in the interim.            Plans and Recommendations:    1. CLL, Rai 4   Continue to hold venetoclax   Hold bone marrow biopsy plans in view of patient's preference for Grannix supportive therpy  Continue Rani today for ANC 0.1   Continue eltrombopag 75 mg PO daily  Labs every 2 weeks     RTC in 4 weeks ??    2. Arthritis  - continues to use topical CBD oil and this has helped  - May continue prn tylenol but should avoid NSAIDs  - knows to avoid oral CBD oil  ??  3. Health Maintenance: Up to date on 2019 influenza vaccine  - continue annual dermatology follow up  - 1st Shingrix vaccine was given 06/2017; patient will need second vaccine, but previously recommended that she get this done at a pharmacy  or at her PCP's office for insurance purposes        INTERVAL HISTORY   Doing fair. Still feeling  Tired but improving since last visit. Still feeling dizzy (prior to diagnosis).    Otherwise, denies new bone pain, fevers, chills, night sweats, lumps/bumps, tongue swelling, shortness of breath, syncope, lightheadedness, constipation or diarrhea, nausea or vomiting, very easy bruising or bleeding, or urinary changes.       HEMATOLOGICAL / ONCOLOGICAL HISTORY:     Oncology History Overview Note   CLL    First seen in oncology clinic locally 03/31/16    Had a left axillary biopsy 03/15/16 that confirmed CLL/SLL: Flow showed monoclonal B-cell population with CD5, CD23, kappa light chain. IHC showed positive CD20 (diffuse), negative for cyclin D1.    No constitutional symptoms except some sweats    Labs: Hb 12.0, Hct 34.8, plt 147. WBC was 10.6 with ANC 2.5. ALC was 7.5K.    CLL FISH studies: normal panel. No cells with 11q, tri 12, 17p or 13q, and the 11;14 translocation also not detected.    Karyotype: 46,XX,der(4)t(4;4)(p14;q21)[3]/45,XX,dic(6;17)(q12;p11.2) (no loss of TP53 on FISH)    Molecular: No TP53 mutation    CLL-IPI: As per Lancet Oncol Vol 15 October 2014  Includes (1) TP53 status (no abnormalities vs. 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 MRD = 0.32%.    05/15/18: Repeated BM bx given persistent low counts - ~1% CLL in otherwise normal appearing marrow. Myeloid mutation panel negative. Karyotype: 45,XX,dic(6;17)(q12;p11.2)[2]/46,XX[18].       CLL (chronic lymphocytic leukemia) (CMS-HCC)   03/15/2016 Initial Diagnosis    CLL (chronic lymphocytic leukemia) (RAF-HCC)     09/14/2017 - 10/05/2017 Chemotherapy    Rituximab weekly x4  No response     10/23/2017 -  Chemotherapy    Began ven/obi     03/19/2018 Adverse Reaction    Obinutuzumab held given persistent thrombocytopenia. Marrow performed on 03/22/18, patient with no overt CLL (0.32% by MRD flow testing from [facility]). Do not plan to give 6th cycle of obi (unless counts recover in a reasonable timeframe).     04/09/2018 Adverse Reaction    ANC down to 0.2. Held venetoclax (was previously at 200 mg daily).     B-cell chronic lymphocytic leukemia (CMS-HCC)   09/05/2017 - 10/11/2017 Chemotherapy    Chemotherapy Treatment    Treatment Goal Control   Line of Treatment [No plan line of treatment]   Plan Name OP RITUXIMAB    Start Date 09/14/2017   End Date 10/05/2017   Provider Pernell Dupre, MD   Chemotherapy riTUXimab (RITUXAN) 712.5 mg in sodium chloride (  NS) 0.9 % 500 mL IVPB, 375 mg/m2 = 712.5 mg, Intravenous, Once, 1 of 1 cycle  Administration: 712.5 mg (09/14/2017)  riTUXimab (RITUXAN) 712.5 mg in sodium chloride (NS) 0.9 % 250 mL rapid infusion, 375 mg/m2 = 712.5 mg, Intravenous, Once, 1 of 1 cycle  Administration: 712.5 mg (09/21/2017), 712.5 mg (09/28/2017), 712.5 mg (10/05/2017)        10/16/2017 Initial Diagnosis    Chronic lymphocytic leukemia, Rai stage IV (CMS-HCC)     10/23/2017 -  Chemotherapy    OP OBINUTUZUMAB AND VENETOCLAX  chlorambucil 0.5 mg/kg PO on days 1, 15, obinutuzumab 100 mg IV on day 1, obinutuzumab 900 mg IV on day 2, obinutuzumab 1,000 mg IV on days 8,15 on Cycle 1, then obinutuzumab 1,000 mg IV on day 1 for sequent cycles, every 28 days.              OTHER PAST MEDICAL HISTORY:     Patient Active Problem List   Diagnosis   ??? CLL (chronic lymphocytic leukemia) (CMS-HCC)   ??? Chronic migraine without aura   ??? 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 Left bundle branch block (LBBB)   ??? Hypercholesterolemia   ??? Chest pain, unspecified   ??? GERD (gastroesophageal reflux disease)   ??? Osteoporosis   ??? Needs flu shot   ??? BRBPR (bright red blood per rectum)   ??? Other autoimmune hemolytic anemias (CMS-HCC)    ??? B-cell chronic lymphocytic leukemia (CMS-HCC)   ??? Acute diarrhea   ??? Aortic atherosclerosis (CMS-HCC)   ??? Diverticulitis of colon   ??? Epigastric pain   ??? Gastritis, Helicobacter pylori   ??? H/O adenomatous polyp of colon   ??? Healthcare maintenance   ??? LLQ pain   ??? Lymphadenopathy   ??? Migraine without aura and without status migrainosus, not intractable   ??? Syncope and collapse   ??? White matter disease   ??? Vasovagal syncope   ??? Chemotherapy-induced neutropenia (CMS-HCC)   ??? Coccyx pain   ??? Chemotherapy induced diarrhea   ??? Chronic right shoulder pain   ??? Chemotherapy-induced thrombocytopenia       ALLERGIES:     Baclofen    MEDICATIONS:       Outpatient Encounter Medications as of 12/03/2018   Medication Sig Dispense Refill   ??? eltrombopag (PROMACTA) 25 MG tablet Take 3 tablets (75 mg total) by mouth daily. Administer on an empty stomach, 1 hour before or 2 hours after a meal. 90 tablet 2   ??? fluticasone propion-salmeterol (ADVAIR) 100-50 mcg/dose diskus Inhale 2 puffs daily.      ??? lidocaine-me.sal-menthol-camph (CBD-KINGS WITH LIDOCAINE) 4-9-1-1.2 % PtMd Apply 1 Dose topically Four (4) times a day.     ??? loperamide (IMODIUM A-D) 2 mg tablet Take 2 mg by mouth Three (3) times a day as needed for diarrhea.     ??? LORazepam (ATIVAN) 0.5 MG tablet Take 0.5 mg by mouth two (2) times a day as needed.      ??? mirtazapine (REMERON) 7.5 MG tablet Take 7.5 mg by mouth nightly.     ??? multivitamin (TAB-A-VITE/THERAGRAN) per tablet Take 1 tablet by mouth daily.      ??? [EXPIRED] oxyCODONE (ROXICODONE) 5 MG immediate release tablet Take 1 tablet (5 mg total) by mouth every eight (8) hours as needed for pain. 20 tablet 0   ??? pantoprazole (PROTONIX) 40 MG tablet Take by mouth daily.      ??? pravastatin (PRAVACHOL) 80 MG  tablet TAKE 1 TABLET BY MOUTH EVERY NIGHT AT BEDTIME     ??? sertraline (ZOLOFT) 50 MG tablet Take 50 mg by mouth daily.  1   ??? tiZANidine (ZANAFLEX) 2 MG tablet Take 2 mg by mouth Three (3) times a day as needed.     ??? topiramate (TOPAMAX) 50 MG tablet Take 50 mg by mouth daily.  3     Facility-Administered Encounter Medications as of 12/03/2018   Medication Dose Route Frequency Provider Last Rate Last Dose   ??? tbo-filgrastim (GRANIX) injection 480 mcg  480 mcg Subcutaneous Once Vernie Murders, [place]           Physical Exam:  Unable to perform due to CoVID 19 Pandemic       Labs:  Results for orders placed or performed in visit on 11/19/18   Comprehensive Metabolic Panel   Result Value Ref Range    Sodium 140 135 - 145 mmol/L    Potassium 4.2 3.5 - 5.0 mmol/L    Chloride 109 (H) 98 - 107 mmol/L    Anion Gap 6 (L) 7 - 15 mmol/L    CO2 25.0 22.0 - 30.0 mmol/L    BUN 18 7 - 21 mg/dL    Creatinine 1.61 0.96 - 1.00 mg/dL    BUN/Creatinine Ratio 22     EGFR CKD-EPI Non-African American, Female 69 >=60 mL/min/1.77m2    EGFR CKD-EPI African American, Female 25 >=60 mL/min/1.33m2    Glucose 96 70 - 179 mg/dL    Calcium 9.1 8.5 - 04.5 mg/dL    Albumin 3.8 3.5 - 5.0 g/dL    Total Protein 6.0 (L) 6.5 - 8.3 g/dL    Total Bilirubin 0.6 0.0 - 1.2 mg/dL    AST 20 14 - 38 U/L    ALT 18 <35 U/L    Alkaline Phosphatase 91 38 - 126 U/L   CBC w/ Differential   Result Value Ref Range    WBC 2.5 (L) 4.5 - 11.0 10*9/L    RBC 4.29 4.00 - 5.20 10*12/L    HGB 13.6 13.5 - 16.0 g/dL    HCT 40.9 81.1 - 91.4 %    MCV 90.3 80.0 - 100.0 fL    MCH 31.7 26.0 - 34.0 pg    MCHC 35.1 31.0 - 37.0 g/dL    RDW 78.2 (H) 95.6 - 15.0 %    MPV 9.6 7.0 - 10.0 fL    Platelet 51 (L) 150 - 440 10*9/L    Neutrophils % 31.6 %    Lymphocytes % 51.3 %    Monocytes % 9.8 %    Eosinophils % 3.3 %    Basophils % 0.4 %    Absolute Neutrophils 0.8 (L) 2.0 - 7.5 10*9/L    Absolute Lymphocytes 1.3 (L) 1.5 - 5.0 10*9/L    Absolute Monocytes 0.3 0.2 - 0.8 10*9/L    Absolute Eosinophils 0.1 0.0 - 0.4 10*9/L    Absolute Basophils 0.0 0.0 - 0.1 10*9/L    Large Unstained Cells 4 0 - 4 %    Anisocytosis Slight (A) Not Present

## 2018-12-03 ENCOUNTER — Ambulatory Visit: Admit: 2018-12-03 | Discharge: 2018-12-03 | Payer: MEDICARE

## 2018-12-03 ENCOUNTER — Other Ambulatory Visit: Admit: 2018-12-03 | Discharge: 2018-12-03 | Payer: MEDICARE

## 2018-12-03 ENCOUNTER — Institutional Professional Consult (permissible substitution): Admit: 2018-12-03 | Discharge: 2018-12-03 | Payer: MEDICARE

## 2018-12-03 DIAGNOSIS — C9210 Chronic myeloid leukemia, BCR/ABL-positive, not having achieved remission: Principal | ICD-10-CM

## 2018-12-03 DIAGNOSIS — C9110 Chronic lymphocytic leukemia of B-cell type not having achieved remission: Secondary | ICD-10-CM

## 2018-12-03 LAB — CBC W/ AUTO DIFF
BASOPHILS ABSOLUTE COUNT: 0 10*9/L (ref 0.0–0.1)
BASOPHILS RELATIVE PERCENT: 0.7 %
EOSINOPHILS ABSOLUTE COUNT: 0.1 10*9/L (ref 0.0–0.4)
EOSINOPHILS RELATIVE PERCENT: 2 %
HEMATOCRIT: 39.8 % (ref 36.0–46.0)
HEMOGLOBIN: 13.4 g/dL — ABNORMAL LOW (ref 13.5–16.0)
LARGE UNSTAINED CELLS: 2 % (ref 0–4)
LYMPHOCYTES ABSOLUTE COUNT: 1 10*9/L — ABNORMAL LOW (ref 1.5–5.0)
LYMPHOCYTES RELATIVE PERCENT: 27.5 %
MEAN CORPUSCULAR HEMOGLOBIN: 30.5 pg (ref 26.0–34.0)
MEAN CORPUSCULAR VOLUME: 90.7 fL (ref 80.0–100.0)
MEAN PLATELET VOLUME: 8.8 fL (ref 7.0–10.0)
MONOCYTES ABSOLUTE COUNT: 0.2 10*9/L (ref 0.2–0.8)
MONOCYTES RELATIVE PERCENT: 4.5 %
NEUTROPHILS ABSOLUTE COUNT: 2.3 10*9/L (ref 2.0–7.5)
PLATELET COUNT: 78 10*9/L — ABNORMAL LOW (ref 150–440)
RED BLOOD CELL COUNT: 4.39 10*12/L (ref 4.00–5.20)
RED CELL DISTRIBUTION WIDTH: 16.5 % — ABNORMAL HIGH (ref 12.0–15.0)
WBC ADJUSTED: 3.6 10*9/L — ABNORMAL LOW (ref 4.5–11.0)

## 2018-12-03 LAB — COMPREHENSIVE METABOLIC PANEL
ALBUMIN: 3.9 g/dL (ref 3.5–5.0)
ALKALINE PHOSPHATASE: 83 U/L (ref 38–126)
ALT (SGPT): 18 U/L (ref <35)
ANION GAP: 9 mmol/L (ref 7–15)
AST (SGOT): 21 U/L (ref 14–38)
BLOOD UREA NITROGEN: 19 mg/dL (ref 7–21)
BUN / CREAT RATIO: 22
CALCIUM: 9 mg/dL (ref 8.5–10.2)
CHLORIDE: 108 mmol/L — ABNORMAL HIGH (ref 98–107)
CO2: 24 mmol/L (ref 22.0–30.0)
CREATININE: 0.85 mg/dL (ref 0.60–1.00)
EGFR CKD-EPI AA FEMALE: 75 mL/min/{1.73_m2} (ref >=60)
EGFR CKD-EPI NON-AA FEMALE: 65 mL/min/{1.73_m2} (ref >=60)
POTASSIUM: 3.8 mmol/L (ref 3.5–5.0)
PROTEIN TOTAL: 6.5 g/dL (ref 6.5–8.3)
SODIUM: 141 mmol/L (ref 135–145)

## 2018-12-03 LAB — CO2: Carbon dioxide:SCnc:Pt:Ser/Plas:Qn:: 24

## 2018-12-03 LAB — LARGE UNSTAINED CELLS: Lab: 2

## 2018-12-03 NOTE — Unmapped (Signed)
Accessed patient's port with a 20g 3/4 inch needle.  Obtained labs and flushed port with 20ml of normal saline and 5ml of heparin. Patient's port was deaccessed prior to leaving clinic.

## 2018-12-03 NOTE — Unmapped (Signed)
Patient did not have to receive Granix today due to ANC being greater than 1.0 per orders.  Patient checked out and made follow up appts.

## 2018-12-03 NOTE — Unmapped (Signed)
Virtual visit confirmed.Marland Kitchen

## 2018-12-03 NOTE — Unmapped (Signed)
It was a pleasure talking with you today. Your labs look better   Follow up with [place] in 4 weeks with labs every 2 weeks  Continue to hold venetoclax      Please call us if you have any questions or concerns

## 2018-12-06 LAB — INTERPRETATION NEUT AB

## 2018-12-18 ENCOUNTER — Institutional Professional Consult (permissible substitution): Admit: 2018-12-18 | Discharge: 2018-12-19 | Payer: MEDICARE

## 2018-12-18 DIAGNOSIS — C9110 Chronic lymphocytic leukemia of B-cell type not having achieved remission: Secondary | ICD-10-CM

## 2018-12-18 LAB — CBC W/ AUTO DIFF
BASOPHILS ABSOLUTE COUNT: 0 10*9/L (ref 0.0–0.1)
BASOPHILS RELATIVE PERCENT: 0.5 %
EOSINOPHILS ABSOLUTE COUNT: 0.2 10*9/L (ref 0.0–0.4)
EOSINOPHILS RELATIVE PERCENT: 2.8 %
HEMATOCRIT: 39.9 % (ref 36.0–46.0)
HEMOGLOBIN: 13.6 g/dL (ref 13.5–16.0)
LARGE UNSTAINED CELLS: 2 % (ref 0–4)
LYMPHOCYTES ABSOLUTE COUNT: 1.3 10*9/L — ABNORMAL LOW (ref 1.5–5.0)
LYMPHOCYTES RELATIVE PERCENT: 24 %
MEAN CORPUSCULAR HEMOGLOBIN CONC: 34 g/dL (ref 31.0–37.0)
MEAN CORPUSCULAR HEMOGLOBIN: 30.4 pg (ref 26.0–34.0)
MEAN CORPUSCULAR VOLUME: 89.5 fL (ref 80.0–100.0)
MEAN PLATELET VOLUME: 9.1 fL (ref 7.0–10.0)
MONOCYTES ABSOLUTE COUNT: 0.4 10*9/L (ref 0.2–0.8)
NEUTROPHILS RELATIVE PERCENT: 64.2 %
PLATELET COUNT: 70 10*9/L — ABNORMAL LOW (ref 150–440)
RED BLOOD CELL COUNT: 4.46 10*12/L (ref 4.00–5.20)
RED CELL DISTRIBUTION WIDTH: 16.3 % — ABNORMAL HIGH (ref 12.0–15.0)
WBC ADJUSTED: 5.6 10*9/L (ref 4.5–11.0)

## 2018-12-18 LAB — ANISOCYTOSIS

## 2018-12-18 LAB — COMPREHENSIVE METABOLIC PANEL
ALBUMIN: 4.1 g/dL (ref 3.5–5.0)
ALKALINE PHOSPHATASE: 89 U/L (ref 38–126)
ALT (SGPT): 18 U/L (ref <35)
ANION GAP: 3 mmol/L — ABNORMAL LOW (ref 7–15)
AST (SGOT): 22 U/L (ref 14–38)
BILIRUBIN TOTAL: 0.8 mg/dL (ref 0.0–1.2)
BLOOD UREA NITROGEN: 18 mg/dL (ref 7–21)
BUN / CREAT RATIO: 23
CALCIUM: 9.1 mg/dL (ref 8.5–10.2)
CO2: 24 mmol/L (ref 22.0–30.0)
CREATININE: 0.77 mg/dL (ref 0.60–1.00)
EGFR CKD-EPI AA FEMALE: 84 mL/min/{1.73_m2} (ref >=60)
EGFR CKD-EPI NON-AA FEMALE: 73 mL/min/{1.73_m2} (ref >=60)
GLUCOSE RANDOM: 81 mg/dL (ref 70–179)
POTASSIUM: 4.4 mmol/L (ref 3.5–5.0)
SODIUM: 138 mmol/L (ref 135–145)

## 2018-12-18 LAB — POTASSIUM: Potassium:SCnc:Pt:Ser/Plas:Qn:: 4.4

## 2018-12-18 NOTE — Unmapped (Signed)
Pt's port accessed - flushed, bld return noted, labs drawn and heplocked. Port de-accessed. Pt tolerated well.

## 2018-12-25 MED ORDER — ELTROMBOPAG 25 MG TABLET
ORAL_TABLET | Freq: Every day | ORAL | 5 refills | 30 days | Status: CP
Start: 2018-12-25 — End: 2019-03-25

## 2018-12-25 NOTE — Unmapped (Signed)
Hi,    Patient Katrina Gould called requesting a medication refill for the following:    ? Medication: Promacta  ? Dosage: 25 mg  ? Days left of medication: 4  ? Pharmacy: [facility]    Program:   Speciality:      The expected turnaround time is 3-4 business days       Check Indicates criteria has been reviewed and confirmed with the patient:    []  Preferred Name   [x]  DOB and/or MR#  [x]  Preferred Contact Method  [x]  Phone Number(s)   [x]  Preferred Pharmacy   []  MyChart     Thank you,  Christell Faith  [facility]  [phone]

## 2019-01-01 ENCOUNTER — Institutional Professional Consult (permissible substitution): Admit: 2019-01-01 | Discharge: 2019-01-01 | Payer: MEDICARE | Attending: Adult Health | Primary: Adult Health

## 2019-01-01 ENCOUNTER — Ambulatory Visit: Admit: 2019-01-01 | Discharge: 2019-01-01 | Payer: MEDICARE

## 2019-01-01 ENCOUNTER — Institutional Professional Consult (permissible substitution): Admit: 2019-01-01 | Discharge: 2019-01-01 | Payer: MEDICARE

## 2019-01-01 DIAGNOSIS — C9110 Chronic lymphocytic leukemia of B-cell type not having achieved remission: Secondary | ICD-10-CM

## 2019-01-01 DIAGNOSIS — M1990 Unspecified osteoarthritis, unspecified site: Secondary | ICD-10-CM

## 2019-01-01 DIAGNOSIS — T451X5A Adverse effect of antineoplastic and immunosuppressive drugs, initial encounter: Secondary | ICD-10-CM

## 2019-01-01 DIAGNOSIS — Z79899 Other long term (current) drug therapy: Secondary | ICD-10-CM

## 2019-01-01 DIAGNOSIS — D696 Thrombocytopenia, unspecified: Secondary | ICD-10-CM

## 2019-01-01 DIAGNOSIS — D6959 Other secondary thrombocytopenia: Secondary | ICD-10-CM

## 2019-01-01 LAB — CBC W/ AUTO DIFF
BASOPHILS ABSOLUTE COUNT: 0 10*9/L (ref 0.0–0.1)
BASOPHILS RELATIVE PERCENT: 0.5 %
EOSINOPHILS ABSOLUTE COUNT: 0.1 10*9/L (ref 0.0–0.4)
HEMATOCRIT: 37.9 % (ref 36.0–46.0)
HEMOGLOBIN: 13.2 g/dL — ABNORMAL LOW (ref 13.5–16.0)
LYMPHOCYTES ABSOLUTE COUNT: 1.3 10*9/L — ABNORMAL LOW (ref 1.5–5.0)
LYMPHOCYTES RELATIVE PERCENT: 29.6 %
MEAN CORPUSCULAR HEMOGLOBIN CONC: 34.8 g/dL (ref 31.0–37.0)
MEAN CORPUSCULAR HEMOGLOBIN: 31.1 pg (ref 26.0–34.0)
MEAN CORPUSCULAR VOLUME: 89.3 fL (ref 80.0–100.0)
MEAN PLATELET VOLUME: 9.3 fL (ref 7.0–10.0)
MONOCYTES ABSOLUTE COUNT: 0.3 10*9/L (ref 0.2–0.8)
MONOCYTES RELATIVE PERCENT: 6.3 %
NEUTROPHILS ABSOLUTE COUNT: 2.5 10*9/L (ref 2.0–7.5)
NEUTROPHILS RELATIVE PERCENT: 57.6 %
RED BLOOD CELL COUNT: 4.25 10*12/L (ref 4.00–5.20)
RED CELL DISTRIBUTION WIDTH: 16.3 % — ABNORMAL HIGH (ref 12.0–15.0)
WBC ADJUSTED: 4.3 10*9/L — ABNORMAL LOW (ref 4.5–11.0)

## 2019-01-01 LAB — EOSINOPHILS RELATIVE PERCENT: Lab: 3.3

## 2019-01-01 NOTE — Unmapped (Signed)
[facility] Telephone Visit    Patient Name: Katrina Gould  Patient Age: 78 y.o.  Encounter Date: 01/01/2019      This patient visit was completed through the use of an audio/video or telephone encounter.    I spent 15 minutes on the phone with the patient. I spent an additional 10 minutes on pre- and post-visit activities.     The patient was physically located in [place] or a state in which I am permitted to provide care. The patient understood that s/he may incur co-pays and cost sharing, and agreed to the telemedicine visit. The visit was completed via phone and/or video, which was appropriate and reasonable under the circumstances given the patient's presentation at the time.    The patient has been advised of the potential risks and limitations of this mode of treatment (including, but not limited to, the absence of in-person examination) and has agreed to be treated using telemedicine. The patient's/patient's family's questions regarding telemedicine have been answered.     If the phone/video visit was completed in an ambulatory setting, the patient has also been advised to contact their provider???s office for worsening conditions, and seek emergency medical treatment and/or call 911 if the patient deems either necessary.    Visit conducted by: Telephone  Person contacted: [name] phone number: [phone]  Is there someone else in the room? no         Primary Care Provider:  Lauro Regulus    Referring Physician:  Lauro Regulus    REASON FOR VISIT:   78 y.o. female referred by Lauro Regulus in consultation for evaluation of CLL.    ASSESSMENT/PLAN     Katrina Gould is a 78 y.o. female who presents for follow of her CLL/SLL, Rai stage 4. Disease markers: 46,XX,der(4)t(4;4)(p14;q21). IGHV unmutated (0%, 3-33*01). B67m 5.05. No TP53 mutation. High-risk by CLL-IPI (6 points).  The patient had a visit to [facility] for second opinion, with no new recommendations regarding therapy. She will continue her care here.     Katrina Gould is doing well.  She does still have thrombocytopenia, with platelets that have gone back down to 59.  She's not having any bleeding and is taking her eltrombopag as directed. Fortunately, her ANC is 2.5 and she has not needed Granix since 7/21.   She feels well overall, but is disappointed to see her platelets have fallen again.  Again reviewed that we could do a BMBx to evaluate possible cause of TCP, but she declines.    Since ANC recovered, would be reasonable to spread out labs to monthly.    Dizziness is at baseline, and has not spoken to her PCP regarding treatment.    Plans and Recommendations:    1. CLL, Rai 4   - Continue to hold venetoclax   - Continue eltrombopag 75 mg PO daily  - Labs every 2 weeks    - RTC in 4 weeks ??    2. Arthritis  - continues to use topical CBD oil and this has helped  - May continue prn tylenol but should avoid NSAIDs  - knows to avoid oral CBD oil  ??  3. Health Maintenance: Up to date on 2019 influenza vaccine  - continue annual dermatology follow up  - 1st Shingrix vaccine was given 06/2017; patient will need second vaccine, but previously recommended that she get this done at a pharmacy or at her PCP's office for insurance  purposes    Markus Jarvis, RN, MSN, AGPCNP-C  Nurse Practitioner  Hematologic Malignancies  [facility]  [phone] (phone)  [phone] (fax)  [email or web address]      INTERVAL HISTORY   Doing fair overall.  Didn't sleep well last night.  Has not missed any doses of Promacta.  Reports that she is gaining weight.  No b-symptoms.  Still feeling dizzy (prior to diagnosis).      Otherwise, denies new bone pain, fevers, chills, night sweats, lumps/bumps, tongue swelling, shortness of breath, syncope, lightheadedness, constipation or diarrhea, nausea or vomiting, very easy bruising or bleeding, or urinary changes.       HEMATOLOGICAL / ONCOLOGICAL HISTORY: Oncology History Overview Note   CLL    First seen in oncology clinic locally 03/31/16    Had a left axillary biopsy 03/15/16 that confirmed CLL/SLL: Flow showed monoclonal B-cell population with CD5, CD23, kappa light chain. IHC showed positive CD20 (diffuse), negative for cyclin D1.    No constitutional symptoms except some sweats    Labs: Hb 12.0, Hct 34.8, plt 147. WBC was 10.6 with ANC 2.5. ALC was 7.5K.    CLL FISH studies: normal panel. No cells with 11q, tri 12, 17p or 13q, and the 11;14 translocation also not detected.    Karyotype: 46,XX,der(4)t(4;4)(p14;q21)[3]/45,XX,dic(6;17)(q12;p11.2) (no loss of TP53 on FISH)    Molecular: No TP53 mutation    CLL-IPI: As per Lancet Oncol Vol 15 October 2014  Includes (1) TP53 status (no abnormalities vs. 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 MRD = 0.32%.    05/15/18: Repeated BM bx given persistent low counts - ~1% CLL in otherwise normal appearing marrow. Myeloid mutation panel negative. Karyotype: 45,XX,dic(6;17)(q12;p11.2)[2]/46,XX[18].       CLL (chronic lymphocytic leukemia) (CMS-HCC)   03/15/2016 Initial Diagnosis    CLL (chronic lymphocytic leukemia) (RAF-HCC)     09/14/2017 - 10/05/2017 Chemotherapy    Rituximab weekly x4  No response     10/23/2017 -  Chemotherapy    Began ven/obi     03/19/2018 Adverse Reaction    Obinutuzumab held given persistent thrombocytopenia. Marrow performed on 03/22/18, patient with no overt CLL (0.32% by MRD flow testing from [facility]). Do not plan to give 6th cycle of obi (unless counts recover in a reasonable timeframe).     04/09/2018 Adverse Reaction    ANC down to 0.2. Held venetoclax (was previously at 200 mg daily).     B-cell chronic lymphocytic leukemia (CMS-HCC)   09/05/2017 - 10/11/2017 Chemotherapy    Chemotherapy Treatment    Treatment Goal Control   Line of Treatment [No plan line of treatment]  Plan Name OP RITUXIMAB    Start Date 09/14/2017   End Date 10/05/2017   Provider Pernell Dupre, MD   Chemotherapy riTUXimab (RITUXAN) 712.5 mg in sodium chloride (NS) 0.9 % 500 mL IVPB, 375 mg/m2 = 712.5 mg, Intravenous, Once, 1 of 1 cycle  Administration: 712.5 mg (09/14/2017)  riTUXimab (RITUXAN) 712.5 mg in sodium chloride (NS) 0.9 % 250 mL rapid infusion, 375 mg/m2 = 712.5 mg, Intravenous, Once, 1 of 1 cycle  Administration: 712.5 mg (09/21/2017), 712.5 mg (09/28/2017), 712.5 mg (10/05/2017)        10/16/2017 Initial Diagnosis    Chronic lymphocytic leukemia, Rai stage IV (CMS-HCC)     10/23/2017 -  Chemotherapy    OP OBINUTUZUMAB AND VENETOCLAX  chlorambucil 0.5 mg/kg PO on days 1, 15, obinutuzumab 100 mg IV on day 1, obinutuzumab 900 mg IV on day 2, obinutuzumab 1,000 mg IV on days 8,15 on Cycle 1, then obinutuzumab 1,000 mg IV on day 1 for sequent cycles, every 28 days.              OTHER PAST MEDICAL HISTORY:     Patient Active Problem List   Diagnosis   ??? CLL (chronic lymphocytic leukemia) (CMS-HCC)   ??? Chronic migraine without aura   ??? 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 Left bundle branch block (LBBB)   ??? Hypercholesterolemia   ??? Chest pain, unspecified   ??? GERD (gastroesophageal reflux disease)   ??? Osteoporosis   ??? Needs flu shot   ??? BRBPR (bright red blood per rectum)   ??? Other autoimmune hemolytic anemias (CMS-HCC) ??? B-cell chronic lymphocytic leukemia (CMS-HCC)   ??? Acute diarrhea   ??? Aortic atherosclerosis (CMS-HCC)   ??? Diverticulitis of colon   ??? Epigastric pain   ??? Gastritis, Helicobacter pylori   ??? H/O adenomatous polyp of colon   ??? Healthcare maintenance   ??? LLQ pain   ??? Lymphadenopathy   ??? Migraine without aura and without status migrainosus, not intractable   ??? Syncope and collapse   ??? White matter disease   ??? Vasovagal syncope   ??? Chemotherapy-induced neutropenia (CMS-HCC)   ??? Coccyx pain   ??? Chemotherapy induced diarrhea   ??? Chronic right shoulder pain   ??? Chemotherapy-induced thrombocytopenia       ALLERGIES:     Baclofen    MEDICATIONS:       Outpatient Encounter Medications as of 01/01/2019   Medication Sig Dispense Refill   ??? eltrombopag (PROMACTA) 25 MG tablet Take 3 tablets (75 mg total) by mouth daily. Administer on an empty stomach, 1 hour before or 2 hours after a meal. 90 tablet 5   ??? fluticasone propion-salmeterol (ADVAIR) 100-50 mcg/dose diskus Inhale 2 puffs daily.      ??? lidocaine-me.sal-menthol-camph (CBD-KINGS WITH LIDOCAINE) 4-9-1-1.2 % PtMd Apply 1 Dose topically Four (4) times a day.     ??? loperamide (IMODIUM A-D) 2 mg tablet Take 2 mg by mouth Three (3) times a day as needed for diarrhea.     ??? LORazepam (ATIVAN) 0.5 MG tablet Take 0.5 mg by mouth two (2) times a day as needed.      ??? mirtazapine (REMERON) 7.5 MG tablet Take 7.5 mg by mouth nightly.     ??? multivitamin (TAB-A-VITE/THERAGRAN) per tablet Take 1 tablet by mouth daily.      ??? pantoprazole (PROTONIX) 40 MG tablet Take by mouth daily.      ??? pravastatin (PRAVACHOL) 80 MG tablet  TAKE 1 TABLET BY MOUTH EVERY NIGHT AT BEDTIME     ??? sertraline (ZOLOFT) 50 MG tablet Take 50 mg by mouth daily.  1   ??? tiZANidine (ZANAFLEX) 2 MG tablet Take 2 mg by mouth Three (3) times a day as needed.     ??? topiramate (TOPAMAX) 50 MG tablet Take 50 mg by mouth daily.  3     Facility-Administered Encounter Medications as of 01/01/2019   Medication Dose Route Frequency Provider Last Rate Last Dose   ??? tbo-filgrastim (GRANIX) injection 480 mcg  480 mcg Subcutaneous Once Vernie Murders, [place]           Physical Exam:  LIMITED EXAM DUE TO ATTEMPT TO LIMIT CONTACT DUE TO CORONAVIRUS IN THE COMMUNITY    GENERAL:  The patient sounds well and is in no acute distress.  RESP: Breathing comfortably and unlabored speech.    NEURO: A&Ox4  PSYCH: Normal affect    Labs:  Results for orders placed or performed in visit on 01/01/19   CBC w/ Differential   Result Value Ref Range    WBC 4.3 (L) 4.5 - 11.0 10*9/L    RBC 4.25 4.00 - 5.20 10*12/L    HGB 13.2 (L) 13.5 - 16.0 g/dL    HCT 16.1 09.6 - 04.5 %    MCV 89.3 80.0 - 100.0 fL    MCH 31.1 26.0 - 34.0 pg    MCHC 34.8 31.0 - 37.0 g/dL    RDW 40.9 (H) 81.1 - 15.0 %    MPV 9.3 7.0 - 10.0 fL    Platelet 59 (L) 150 - 440 10*9/L    Neutrophils % 57.6 %    Lymphocytes % 29.6 %    Monocytes % 6.3 %    Eosinophils % 3.3 %    Basophils % 0.5 %    Absolute Neutrophils 2.5 2.0 - 7.5 10*9/L    Absolute Lymphocytes 1.3 (L) 1.5 - 5.0 10*9/L    Absolute Monocytes 0.3 0.2 - 0.8 10*9/L    Absolute Eosinophils 0.1 0.0 - 0.4 10*9/L    Absolute Basophils 0.0 0.0 - 0.1 10*9/L    Large Unstained Cells 3 0 - 4 %    Anisocytosis Slight (A) Not Present

## 2019-01-01 NOTE — Unmapped (Signed)
Received patient for port lab draw in no acute distress. Port accessed, labs drawn and sent for analysis.     ANC 2.5 platelet 59, no indication for treatment today.     Port deaccessed and patient discharged with AVS.

## 2019-01-01 NOTE — Unmapped (Signed)
Clinical Support on 01/01/2019   Component Date Value Ref Range Status   ??? WBC 01/01/2019 4.3* 4.5 - 11.0 10*9/L Final   ??? RBC 01/01/2019 4.25  4.00 - 5.20 10*12/L Final   ??? HGB 01/01/2019 13.2* 13.5 - 16.0 g/dL Final   ??? HCT 16/01/9603 37.9  36.0 - 46.0 % Final   ??? MCV 01/01/2019 89.3  80.0 - 100.0 fL Final   ??? MCH 01/01/2019 31.1  26.0 - 34.0 pg Final   ??? MCHC 01/01/2019 34.8  31.0 - 37.0 g/dL Final   ??? RDW 54/12/8117 16.3* 12.0 - 15.0 % Final   ??? MPV 01/01/2019 9.3  7.0 - 10.0 fL Final   ??? Platelet 01/01/2019 59* 150 - 440 10*9/L Final   ??? Neutrophils % 01/01/2019 57.6  % Final   ??? Lymphocytes % 01/01/2019 29.6  % Final   ??? Monocytes % 01/01/2019 6.3  % Final   ??? Eosinophils % 01/01/2019 3.3  % Final   ??? Basophils % 01/01/2019 0.5  % Final   ??? Absolute Neutrophils 01/01/2019 2.5  2.0 - 7.5 10*9/L Final   ??? Absolute Lymphocytes 01/01/2019 1.3* 1.5 - 5.0 10*9/L Final   ??? Absolute Monocytes 01/01/2019 0.3  0.2 - 0.8 10*9/L Final   ??? Absolute Eosinophils 01/01/2019 0.1  0.0 - 0.4 10*9/L Final   ??? Absolute Basophils 01/01/2019 0.0  0.0 - 0.1 10*9/L Final   ??? Large Unstained Cells 01/01/2019 3  0 - 4 % Final   ??? Anisocytosis 01/01/2019 Slight* Not Present Final       If you feel like this is an emergency please call 911.  For appointments or questions Monday through Friday 8AM-5PM please call [phone] or Toll Free [phone]. For Medical questions or concerns ask for the Nurse Triage Line.  On Nights, Weekends, and Holidays call [phone] and ask for the Oncologist on Call.  Reasons to call the Nurse Triage Line:  Fever of 100.5 or greater  Nausea and/or vomiting not relived with nausea medicine  Diarrhea or constipation  Severe pain not relieved with usual pain regimen  Shortness of breath  Uncontrolled bleeding  Mental status changes

## 2019-01-03 NOTE — Unmapped (Signed)
Addended by: Synetta Fail K on: 01/03/2019 09:01 AM     Modules accepted: Level of Service

## 2019-01-15 ENCOUNTER — Ambulatory Visit: Admit: 2019-01-15 | Discharge: 2019-01-16 | Payer: MEDICARE

## 2019-01-15 DIAGNOSIS — C9110 Chronic lymphocytic leukemia of B-cell type not having achieved remission: Secondary | ICD-10-CM

## 2019-01-15 LAB — COMPREHENSIVE METABOLIC PANEL
ALKALINE PHOSPHATASE: 100 U/L (ref 38–126)
ALT (SGPT): 16 U/L (ref <35)
ANION GAP: 10 mmol/L (ref 7–15)
AST (SGOT): 22 U/L (ref 14–38)
BILIRUBIN TOTAL: 1.1 mg/dL (ref 0.0–1.2)
BLOOD UREA NITROGEN: 19 mg/dL (ref 7–21)
BUN / CREAT RATIO: 24
CALCIUM: 9.1 mg/dL (ref 8.5–10.2)
CHLORIDE: 106 mmol/L (ref 98–107)
CO2: 26 mmol/L (ref 22.0–30.0)
CREATININE: 0.78 mg/dL (ref 0.60–1.00)
EGFR CKD-EPI AA FEMALE: 83 mL/min/{1.73_m2} (ref >=60)
EGFR CKD-EPI NON-AA FEMALE: 72 mL/min/{1.73_m2} (ref >=60)
GLUCOSE RANDOM: 122 mg/dL (ref 70–179)
POTASSIUM: 4.5 mmol/L (ref 3.5–5.0)
PROTEIN TOTAL: 6.6 g/dL (ref 6.5–8.3)
SODIUM: 142 mmol/L (ref 135–145)

## 2019-01-15 LAB — CBC W/ AUTO DIFF
BASOPHILS ABSOLUTE COUNT: 0 10*9/L (ref 0.0–0.1)
BASOPHILS RELATIVE PERCENT: 0.5 %
EOSINOPHILS ABSOLUTE COUNT: 0.1 10*9/L (ref 0.0–0.4)
EOSINOPHILS RELATIVE PERCENT: 3.3 %
HEMATOCRIT: 38.4 % (ref 36.0–46.0)
LARGE UNSTAINED CELLS: 2 % (ref 0–4)
LYMPHOCYTES ABSOLUTE COUNT: 1.1 10*9/L — ABNORMAL LOW (ref 1.5–5.0)
LYMPHOCYTES RELATIVE PERCENT: 27.7 %
MEAN CORPUSCULAR HEMOGLOBIN: 30.3 pg (ref 26.0–34.0)
MEAN CORPUSCULAR VOLUME: 87.3 fL (ref 80.0–100.0)
MEAN PLATELET VOLUME: 9.6 fL (ref 7.0–10.0)
MONOCYTES ABSOLUTE COUNT: 0.3 10*9/L (ref 0.2–0.8)
NEUTROPHILS ABSOLUTE COUNT: 2.3 10*9/L (ref 2.0–7.5)
NEUTROPHILS RELATIVE PERCENT: 59.3 %
PLATELET COUNT: 56 10*9/L — ABNORMAL LOW (ref 150–440)
RED BLOOD CELL COUNT: 4.4 10*12/L (ref 4.00–5.20)
RED CELL DISTRIBUTION WIDTH: 15.5 % — ABNORMAL HIGH (ref 12.0–15.0)
WBC ADJUSTED: 3.8 10*9/L — ABNORMAL LOW (ref 4.5–11.0)

## 2019-01-15 LAB — MONOCYTES RELATIVE PERCENT: Lab: 6.9

## 2019-01-15 LAB — AST (SGOT): Aspartate aminotransferase:CCnc:Pt:Ser/Plas:Qn:: 22

## 2019-01-15 NOTE — Unmapped (Signed)
If you feel like this is an emergency please call 911.  For appointments or questions Monday through Friday 8AM-5PM please call [phone] or Toll Free [phone]. For Medical questions or concerns ask for the Nurse Triage Line.  On Nights, Weekends, and Holidays call [phone] and ask for the Oncologist on Call.  Reasons to call the Nurse Triage Line:  Fever of 100.5 or greater  Nausea and/or vomiting not relived with nausea medicine  Diarrhea or constipation  Severe pain not relieved with usual pain regimen  Shortness of breath  Uncontrolled bleeding  Mental status changes    Office Visit on 01/15/2019   Component Date Value Ref Range Status   ??? Sodium 01/15/2019 142  135 - 145 mmol/L Final   ??? Potassium 01/15/2019 4.5  3.5 - 5.0 mmol/L Final   ??? Chloride 01/15/2019 106  98 - 107 mmol/L Final   ??? Anion Gap 01/15/2019 10  7 - 15 mmol/L Final   ??? CO2 01/15/2019 26.0  22.0 - 30.0 mmol/L Final   ??? BUN 01/15/2019 19  7 - 21 mg/dL Final   ??? Creatinine 01/15/2019 0.78  0.60 - 1.00 mg/dL Final   ??? BUN/Creatinine Ratio 01/15/2019 24   Final   ??? EGFR CKD-EPI Non-African American,* 01/15/2019 72  >=60 mL/min/1.38m2 Final   ??? EGFR CKD-EPI African American, Fem* 01/15/2019 83  >=60 mL/min/1.44m2 Final   ??? Glucose 01/15/2019 122  70 - 179 mg/dL Final   ??? Calcium 57/84/6962 9.1  8.5 - 10.2 mg/dL Final   ??? Albumin 95/28/4132 4.0  3.5 - 5.0 g/dL Final   ??? Total Protein 01/15/2019 6.6  6.5 - 8.3 g/dL Final   ??? Total Bilirubin 01/15/2019 1.1  0.0 - 1.2 mg/dL Final   ??? AST 44/05/270 22  14 - 38 U/L Final   ??? ALT 01/15/2019 16  <35 U/L Final   ??? Alkaline Phosphatase 01/15/2019 100  38 - 126 U/L Final   ??? WBC 01/15/2019 3.8* 4.5 - 11.0 10*9/L Final   ??? RBC 01/15/2019 4.40  4.00 - 5.20 10*12/L Final   ??? HGB 01/15/2019 13.3* 13.5 - 16.0 g/dL Final   ??? HCT 53/66/4403 38.4  36.0 - 46.0 % Final   ??? MCV 01/15/2019 87.3  80.0 - 100.0 fL Final   ??? MCH 01/15/2019 30.3  26.0 - 34.0 pg Final   ??? MCHC 01/15/2019 34.7  31.0 - 37.0 g/dL Final   ??? RDW 47/42/5956 15.5* 12.0 - 15.0 % Final   ??? MPV 01/15/2019 9.6  7.0 - 10.0 fL Final   ??? Platelet 01/15/2019 56* 150 - 440 10*9/L Final   ??? Neutrophils % 01/15/2019 59.3  % Final   ??? Lymphocytes % 01/15/2019 27.7  % Final   ??? Monocytes % 01/15/2019 6.9  % Final   ??? Eosinophils % 01/15/2019 3.3  % Final   ??? Basophils % 01/15/2019 0.5  % Final   ??? Absolute Neutrophils 01/15/2019 2.3  2.0 - 7.5 10*9/L Final   ??? Absolute Lymphocytes 01/15/2019 1.1* 1.5 - 5.0 10*9/L Final   ??? Absolute Monocytes 01/15/2019 0.3  0.2 - 0.8 10*9/L Final   ??? Absolute Eosinophils 01/15/2019 0.1  0.0 - 0.4 10*9/L Final   ??? Absolute Basophils 01/15/2019 0.0  0.0 - 0.1 10*9/L Final   ??? Large Unstained Cells 01/15/2019 2  0 - 4 % Final

## 2019-01-15 NOTE — Unmapped (Signed)
Pt arrived to infusion in NAD. Reports BLE swelling with increased activity recently. APP made aware. Pt denies any other issues. Port accessed. Labs collected and sent. Results WNL. Port heparin locked and deaccessed. Pt given AVS and discharged in NAD.

## 2019-01-28 ENCOUNTER — Ambulatory Visit: Admit: 2019-01-28 | Discharge: 2019-01-28 | Payer: MEDICARE

## 2019-01-28 ENCOUNTER — Institutional Professional Consult (permissible substitution): Admit: 2019-01-28 | Discharge: 2019-01-28 | Payer: MEDICARE | Attending: Adult Health | Primary: Adult Health

## 2019-01-28 DIAGNOSIS — C9110 Chronic lymphocytic leukemia of B-cell type not having achieved remission: Secondary | ICD-10-CM

## 2019-01-28 NOTE — Unmapped (Signed)
[facility] Telephone Visit    Patient Name: Katrina Gould  Patient Age: 78 y.o.  Encounter Date: 01/28/2019      This patient visit was completed through the use of an audio/video or telephone encounter.    I spent 15 minutes on the phone with the patient. I spent an additional 10 minutes on pre- and post-visit activities.     The patient was physically located in [place] or a state in which I am permitted to provide care. The patient understood that s/he may incur co-pays and cost sharing, and agreed to the telemedicine visit. The visit was completed via phone and/or video, which was appropriate and reasonable under the circumstances given the patient's presentation at the time.    The patient has been advised of the potential risks and limitations of this mode of treatment (including, but not limited to, the absence of in-person examination) and has agreed to be treated using telemedicine. The patient's/patient's family's questions regarding telemedicine have been answered.     If the phone/video visit was completed in an ambulatory setting, the patient has also been advised to contact their provider???s office for worsening conditions, and seek emergency medical treatment and/or call 911 if the patient deems either necessary.    Visit conducted by: Telephone  Person contacted: [name] phone number: [phone]  Is there someone else in the room? no         Primary Care Provider:  Lauro Regulus    Referring Physician:  Lauro Regulus    REASON FOR VISIT:   78 y.o. female referred by Lauro Regulus in consultation for evaluation of CLL.    ASSESSMENT/PLAN     Katrina Gould is a 78 y.o. female who presents for follow of her CLL/SLL, Rai stage 4. Disease markers: 46,XX,der(4)t(4;4)(p14;q21). IGHV unmutated (0%, 3-33*01). B81m 5.05. No TP53 mutation. High-risk by CLL-IPI (6 points).  The patient had a visit to [facility] for second opinion, with no new recommendations regarding therapy. She will continue her care here.     Katrina Gould is feeling well with good performance status.  She forgot to have her labs checked today, so we will arrange for labs later this week.       Plans and Recommendations:    1. CLL, Rai 4   - obtain labs later this week  - Continue to hold venetoclax   - Continue eltrombopag 75 mg PO daily  - Labs every 2 weeks    - RTC in 4 weeks ??    2. Arthritis  - continues to use topical CBD oil and this has helped  - May continue prn tylenol but should avoid NSAIDs  - knows to avoid oral CBD oil  ??  3. Health Maintenance: Up to date on 2019 influenza vaccine  - continue annual dermatology follow up  - 1st Shingrix vaccine was given 06/2017; patient will need second vaccine, but previously recommended that she get this done at a pharmacy or at her PCP's office for insurance purposes    Dr. Lonni Fix was available.    Katrina Gould, AGPCNP-BC  Nurse Practitioner  Hematology/Oncology  [facility]        INTERVAL HISTORY   Doing well overall. She reports good energy. She rents out a house and has spent the past couple of days cleaning it out for new tenants. She reports that she is tired now, but had the stamina to do a lot  of cleaning. With being busy, she forgot to get her labs checked today but is available to get them collected this week.    Otherwise, denies new bone pain, fevers, chills, night sweats, lumps/bumps, tongue swelling, shortness of breath, syncope, lightheadedness, constipation or diarrhea, nausea or vomiting, very easy bruising or bleeding, or urinary changes.       HEMATOLOGICAL / ONCOLOGICAL HISTORY:     Oncology History Overview Note   CLL    First seen in oncology clinic locally 03/31/16    Had a left axillary biopsy 03/15/16 that confirmed CLL/SLL: Flow showed monoclonal B-cell population with CD5, CD23, kappa light chain. IHC showed positive CD20 (diffuse), negative for cyclin D1.    No constitutional symptoms except some sweats    Labs: Hb 12.0, Hct 34.8, plt 147. WBC was 10.6 with ANC 2.5. ALC was 7.5K.    CLL FISH studies: normal panel. No cells with 11q, tri 12, 17p or 13q, and the 11;14 translocation also not detected.    Karyotype: 46,XX,der(4)t(4;4)(p14;q21)[3]/45,XX,dic(6;17)(q12;p11.2) (no loss of TP53 on FISH)    Molecular: No TP53 mutation    CLL-IPI: As per Lancet Oncol Vol 15 October 2014  Includes (1) TP53 status (no abnormalities vs. 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 revealed CD5-positive, kappa light chain-restricted B-cell  population is detected. MRD = 0.32%.    05/15/18: Repeated BM bx given persistent low counts - ~1% CLL in otherwise normal appearing marrow. Myeloid mutation panel negative. Karyotype: 45,XX,dic(6;17)(q12;p11.2)[2]/46,XX[18].       CLL (chronic lymphocytic leukemia) (CMS-HCC)   03/15/2016 Initial Diagnosis    CLL (chronic lymphocytic leukemia) (RAF-HCC)     09/14/2017 - 10/05/2017 Chemotherapy    Rituximab weekly x4  No response     10/23/2017 -  Chemotherapy    Began ven/obi     03/19/2018 Adverse Reaction    Obinutuzumab held given persistent thrombocytopenia. Marrow performed on 03/22/18, patient with no overt CLL (0.32% by MRD flow testing from [facility]). Do not plan to give 6th cycle of obi (unless counts recover in a reasonable timeframe).     04/09/2018 Adverse Reaction    ANC down to 0.2. Held venetoclax (was previously at 200 mg daily).     B-cell chronic lymphocytic leukemia (CMS-HCC)   09/05/2017 - 10/11/2017 Chemotherapy    Chemotherapy Treatment    Treatment Goal Control   Line of Treatment [No plan line of treatment]   Plan Name OP RITUXIMAB    Start Date 09/14/2017   End Date 10/05/2017   Provider Pernell Dupre, MD   Chemotherapy riTUXimab (RITUXAN) 712.5 mg in sodium chloride (NS) 0.9 % 500 mL IVPB, 375 mg/m2 = 712.5 mg, Intravenous, Once, 1  of 1 cycle  Administration: 712.5 mg (09/14/2017)  riTUXimab (RITUXAN) 712.5 mg in sodium chloride (NS) 0.9 % 250 mL rapid infusion, 375 mg/m2 = 712.5 mg, Intravenous, Once, 1 of 1 cycle  Administration: 712.5 mg (09/21/2017), 712.5 mg (09/28/2017), 712.5 mg (10/05/2017)        10/16/2017 Initial Diagnosis    Chronic lymphocytic leukemia, Rai stage IV (CMS-HCC)     10/23/2017 -  Chemotherapy    OP OBINUTUZUMAB AND VENETOCLAX  chlorambucil 0.5 mg/kg PO on days 1, 15, obinutuzumab 100 mg IV on day 1, obinutuzumab 900 mg IV on day 2, obinutuzumab 1,000 mg IV on days 8,15 on Cycle 1, then obinutuzumab 1,000 mg IV on day 1 for sequent cycles, every 28 days.              OTHER PAST MEDICAL HISTORY:     Patient Active Problem List   Diagnosis   ??? CLL (chronic lymphocytic leukemia) (CMS-HCC)   ??? Chronic migraine without aura   ??? 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 Left bundle branch block (LBBB)   ??? Hypercholesterolemia   ??? Chest pain, unspecified   ??? GERD (gastroesophageal reflux disease)   ??? Osteoporosis   ??? Needs flu shot   ??? BRBPR (bright red blood per rectum)   ??? Other autoimmune hemolytic anemias (CMS-HCC)    ??? B-cell chronic lymphocytic leukemia (CMS-HCC)   ??? Acute diarrhea   ??? Aortic atherosclerosis (CMS-HCC)   ??? Diverticulitis of colon   ??? Epigastric pain   ??? Gastritis, Helicobacter pylori   ??? H/O adenomatous polyp of colon   ??? Healthcare maintenance   ??? LLQ pain   ??? Lymphadenopathy   ??? Migraine without aura and without status migrainosus, not intractable   ??? Syncope and collapse   ??? White matter disease   ??? Vasovagal syncope   ??? Chemotherapy-induced neutropenia (CMS-HCC)   ??? Coccyx pain   ??? Chemotherapy induced diarrhea   ??? Chronic right shoulder pain   ??? Chemotherapy-induced thrombocytopenia       ALLERGIES:     Baclofen    MEDICATIONS:       Outpatient Encounter Medications as of 01/28/2019   Medication Sig Dispense Refill   ??? eltrombopag (PROMACTA) 25 MG tablet Take 3 tablets (75 mg total) by mouth daily. Administer on an empty stomach, 1 hour before or 2 hours after a meal. 90 tablet 5   ??? fluticasone propion-salmeterol (ADVAIR) 100-50 mcg/dose diskus Inhale 2 puffs daily.      ??? lidocaine-me.sal-menthol-camph (CBD-KINGS WITH LIDOCAINE) 4-9-1-1.2 % PtMd Apply 1 Dose topically Four (4) times a day.     ??? loperamide (IMODIUM A-D) 2 mg tablet Take 2 mg by mouth Three (3) times a day as needed for diarrhea.     ??? LORazepam (ATIVAN) 0.5 MG tablet Take 0.5 mg by mouth two (2) times a day as needed.      ??? mirtazapine (REMERON) 7.5 MG tablet Take 7.5 mg by mouth nightly.     ??? multivitamin (TAB-A-VITE/THERAGRAN) per tablet Take 1 tablet by mouth daily.      ??? pantoprazole (PROTONIX) 40 MG tablet Take by mouth daily.      ??? pravastatin (PRAVACHOL) 80 MG tablet TAKE 1 TABLET BY MOUTH EVERY NIGHT AT BEDTIME     ??? sertraline (ZOLOFT) 50 MG tablet Take 50 mg by mouth daily.  1   ??? tiZANidine (ZANAFLEX) 2 MG tablet Take 2 mg by mouth Three (3) times  a day as needed.     ??? topiramate (TOPAMAX) 50 MG tablet Take 50 mg by mouth daily.  3     Facility-Administered Encounter Medications as of 01/28/2019   Medication Dose Route Frequency Provider Last Rate Last Dose   ??? tbo-filgrastim (GRANIX) injection 480 mcg  480 mcg Subcutaneous Once Vernie Murders, [place]           Physical Exam:  LIMITED EXAM DUE TO ATTEMPT TO LIMIT CONTACT DUE TO CORONAVIRUS IN THE COMMUNITY  Telephone call  GENERAL:  The patient sounds well and is in no acute distress. RESP: Breathing comfortably and unlabored speech.    NEURO: A&Ox4  PSYCH: Normal affect    Labs:  Results for orders placed or performed in visit on 01/15/19   Comprehensive Metabolic Panel   Result Value Ref Range    Sodium 142 135 - 145 mmol/L    Potassium 4.5 3.5 - 5.0 mmol/L    Chloride 106 98 - 107 mmol/L    Anion Gap 10 7 - 15 mmol/L    CO2 26.0 22.0 - 30.0 mmol/L    BUN 19 7 - 21 mg/dL    Creatinine 1.61 0.96 - 1.00 mg/dL    BUN/Creatinine Ratio 24     EGFR CKD-EPI Non-African American, Female 72 >=60 mL/min/1.45m2    EGFR CKD-EPI African American, Female 44 >=60 mL/min/1.77m2    Glucose 122 70 - 179 mg/dL    Calcium 9.1 8.5 - 04.5 mg/dL    Albumin 4.0 3.5 - 5.0 g/dL    Total Protein 6.6 6.5 - 8.3 g/dL    Total Bilirubin 1.1 0.0 - 1.2 mg/dL    AST 22 14 - 38 U/L    ALT 16 <35 U/L    Alkaline Phosphatase 100 38 - 126 U/L   CBC w/ Differential   Result Value Ref Range    WBC 3.8 (L) 4.5 - 11.0 10*9/L    RBC 4.40 4.00 - 5.20 10*12/L    HGB 13.3 (L) 13.5 - 16.0 g/dL    HCT 40.9 81.1 - 91.4 %    MCV 87.3 80.0 - 100.0 fL    MCH 30.3 26.0 - 34.0 pg    MCHC 34.7 31.0 - 37.0 g/dL    RDW 78.2 (H) 95.6 - 15.0 %    MPV 9.6 7.0 - 10.0 fL    Platelet 56 (L) 150 - 440 10*9/L    Neutrophils % 59.3 %    Lymphocytes % 27.7 %    Monocytes % 6.9 %    Eosinophils % 3.3 %    Basophils % 0.5 %    Absolute Neutrophils 2.3 2.0 - 7.5 10*9/L    Absolute Lymphocytes 1.1 (L) 1.5 - 5.0 10*9/L    Absolute Monocytes 0.3 0.2 - 0.8 10*9/L    Absolute Eosinophils 0.1 0.0 - 0.4 10*9/L    Absolute Basophils 0.0 0.0 - 0.1 10*9/L    Large Unstained Cells 2 0 - 4 %

## 2019-01-29 NOTE — Unmapped (Signed)
We will call you after your labs are collected later this week.

## 2019-01-30 NOTE — Unmapped (Signed)
This patient has been disenrolled from the [facility] specialty pharmacy services due to due to discontinuation of medication.Nanetta Batty  [facility] Specialty Pharmacist

## 2019-01-31 ENCOUNTER — Other Ambulatory Visit: Admit: 2019-01-31 | Discharge: 2019-02-01 | Payer: MEDICARE

## 2019-01-31 DIAGNOSIS — C9110 Chronic lymphocytic leukemia of B-cell type not having achieved remission: Secondary | ICD-10-CM

## 2019-02-11 ENCOUNTER — Institutional Professional Consult (permissible substitution): Admit: 2019-02-11 | Discharge: 2019-02-12 | Payer: MEDICARE

## 2019-02-11 DIAGNOSIS — C9110 Chronic lymphocytic leukemia of B-cell type not having achieved remission: Principal | ICD-10-CM

## 2019-02-25 ENCOUNTER — Institutional Professional Consult (permissible substitution): Admit: 2019-02-25 | Discharge: 2019-02-26 | Payer: MEDICARE

## 2019-02-25 DIAGNOSIS — C9110 Chronic lymphocytic leukemia of B-cell type not having achieved remission: Principal | ICD-10-CM

## 2019-02-26 ENCOUNTER — Other Ambulatory Visit: Payer: Self-pay | Admitting: Internal Medicine

## 2019-02-26 ENCOUNTER — Telehealth: Admit: 2019-02-26 | Discharge: 2019-02-27 | Payer: MEDICARE | Attending: Adult Health | Primary: Adult Health

## 2019-02-26 DIAGNOSIS — N644 Mastodynia: Secondary | ICD-10-CM

## 2019-03-11 ENCOUNTER — Institutional Professional Consult (permissible substitution): Admit: 2019-03-11 | Discharge: 2019-03-11 | Payer: MEDICARE

## 2019-03-11 DIAGNOSIS — C9110 Chronic lymphocytic leukemia of B-cell type not having achieved remission: Principal | ICD-10-CM

## 2019-03-18 ENCOUNTER — Ambulatory Visit
Admit: 2019-03-18 | Discharge: 2019-03-18 | Payer: MEDICARE | Attending: Hematology & Oncology | Primary: Hematology & Oncology

## 2019-03-18 ENCOUNTER — Other Ambulatory Visit: Admit: 2019-03-18 | Discharge: 2019-03-18 | Payer: MEDICARE

## 2019-03-18 DIAGNOSIS — C9110 Chronic lymphocytic leukemia of B-cell type not having achieved remission: Principal | ICD-10-CM

## 2019-03-20 DIAGNOSIS — C9110 Chronic lymphocytic leukemia of B-cell type not having achieved remission: Principal | ICD-10-CM

## 2019-03-25 ENCOUNTER — Ambulatory Visit: Admit: 2019-03-25 | Discharge: 2019-03-28 | Disposition: A | Discharge status: Home / Self Care | Payer: MEDICARE

## 2019-03-25 ENCOUNTER — Institutional Professional Consult (permissible substitution): Admit: 2019-03-25 | Discharge: 2019-03-28 | Disposition: A | Discharge status: Home / Self Care | Payer: MEDICARE

## 2019-03-25 DIAGNOSIS — C9110 Chronic lymphocytic leukemia of B-cell type not having achieved remission: Principal | ICD-10-CM

## 2019-03-28 MED ORDER — ELTROMBOPAG 25 MG TABLET
ORAL_TABLET | Freq: Every day | ORAL | 5 refills | 30.00000 days | Status: CP
Start: 2019-03-28 — End: 2019-06-26

## 2019-03-28 MED ORDER — TIZANIDINE 2 MG TABLET
ORAL_TABLET | Freq: Every evening | ORAL | 0 refills | 30.00000 days | Status: CP | PRN
Start: 2019-03-28 — End: 2020-03-27

## 2019-04-01 ENCOUNTER — Ambulatory Visit: Admit: 2019-04-01 | Discharge: 2019-04-02 | Payer: MEDICARE

## 2019-04-01 ENCOUNTER — Ambulatory Visit
Admit: 2019-04-01 | Discharge: 2019-04-02 | Payer: MEDICARE | Attending: Hematology & Oncology | Primary: Hematology & Oncology

## 2019-04-01 ENCOUNTER — Other Ambulatory Visit: Admit: 2019-04-01 | Discharge: 2019-04-02 | Payer: MEDICARE

## 2019-04-01 DIAGNOSIS — D693 Immune thrombocytopenic purpura: Principal | ICD-10-CM

## 2019-04-01 DIAGNOSIS — C9110 Chronic lymphocytic leukemia of B-cell type not having achieved remission: Principal | ICD-10-CM

## 2019-04-01 DIAGNOSIS — Z1159 Encounter for screening for other viral diseases: Principal | ICD-10-CM

## 2019-04-02 ENCOUNTER — Institutional Professional Consult (permissible substitution): Admit: 2019-04-02 | Discharge: 2019-04-03 | Payer: MEDICARE

## 2019-04-02 MED ORDER — ALLOPURINOL 300 MG TABLET
ORAL_TABLET | 0 refills | 0 days | Status: CP
Start: 2019-04-02 — End: ?

## 2019-04-04 ENCOUNTER — Ambulatory Visit: Admit: 2019-04-04 | Discharge: 2019-04-04 | Payer: MEDICARE

## 2019-04-04 ENCOUNTER — Ambulatory Visit: Admit: 2019-04-04 | Discharge: 2019-04-04 | Payer: MEDICARE | Attending: Adult Health | Primary: Adult Health

## 2019-04-08 ENCOUNTER — Other Ambulatory Visit: Admit: 2019-04-08 | Discharge: 2019-04-08 | Payer: MEDICARE

## 2019-04-08 ENCOUNTER — Ambulatory Visit: Admit: 2019-04-08 | Discharge: 2019-04-08 | Payer: MEDICARE | Attending: Adult Health | Primary: Adult Health

## 2019-04-08 ENCOUNTER — Ambulatory Visit: Admit: 2019-04-08 | Discharge: 2019-04-08 | Payer: MEDICARE

## 2019-04-08 DIAGNOSIS — C9110 Chronic lymphocytic leukemia of B-cell type not having achieved remission: Principal | ICD-10-CM

## 2019-04-08 DIAGNOSIS — D693 Immune thrombocytopenic purpura: Principal | ICD-10-CM

## 2019-04-08 DIAGNOSIS — D696 Thrombocytopenia, unspecified: Principal | ICD-10-CM

## 2019-04-09 ENCOUNTER — Ambulatory Visit
Admit: 2019-04-09 | Discharge: 2019-04-10 | Payer: MEDICARE | Attending: Student in an Organized Health Care Education/Training Program | Primary: Student in an Organized Health Care Education/Training Program

## 2019-04-09 ENCOUNTER — Ambulatory Visit: Admit: 2019-04-09 | Discharge: 2019-04-10 | Payer: MEDICARE

## 2019-04-09 ENCOUNTER — Other Ambulatory Visit: Admit: 2019-04-09 | Discharge: 2019-04-10 | Payer: MEDICARE

## 2019-04-09 DIAGNOSIS — D693 Immune thrombocytopenic purpura: Principal | ICD-10-CM

## 2019-04-09 DIAGNOSIS — C9110 Chronic lymphocytic leukemia of B-cell type not having achieved remission: Principal | ICD-10-CM

## 2019-04-11 ENCOUNTER — Ambulatory Visit: Admit: 2019-04-11 | Discharge: 2019-04-12 | Payer: MEDICARE

## 2019-04-11 DIAGNOSIS — C9110 Chronic lymphocytic leukemia of B-cell type not having achieved remission: Principal | ICD-10-CM

## 2019-04-15 ENCOUNTER — Other Ambulatory Visit: Admit: 2019-04-15 | Discharge: 2019-04-16 | Payer: MEDICARE

## 2019-04-15 ENCOUNTER — Ambulatory Visit: Admit: 2019-04-15 | Discharge: 2019-04-16 | Payer: MEDICARE

## 2019-04-15 ENCOUNTER — Ambulatory Visit: Admit: 2019-04-15 | Discharge: 2019-04-16 | Payer: MEDICARE | Attending: Adult Health | Primary: Adult Health

## 2019-04-15 DIAGNOSIS — C9110 Chronic lymphocytic leukemia of B-cell type not having achieved remission: Principal | ICD-10-CM

## 2019-04-15 DIAGNOSIS — D693 Immune thrombocytopenic purpura: Principal | ICD-10-CM

## 2019-04-18 ENCOUNTER — Ambulatory Visit: Payer: Medicare HMO | Attending: Internal Medicine

## 2019-04-18 ENCOUNTER — Ambulatory Visit: Admit: 2019-04-18 | Discharge: 2019-04-19 | Payer: MEDICARE

## 2019-04-18 DIAGNOSIS — C9110 Chronic lymphocytic leukemia of B-cell type not having achieved remission: Principal | ICD-10-CM

## 2019-04-18 DIAGNOSIS — Z20822 Contact with and (suspected) exposure to covid-19: Secondary | ICD-10-CM

## 2019-04-19 LAB — NOVEL CORONAVIRUS, NAA: SARS-CoV-2, NAA: NOT DETECTED

## 2019-04-20 ENCOUNTER — Telehealth: Payer: Self-pay | Admitting: General Practice

## 2019-04-20 NOTE — Telephone Encounter (Signed)
Negative COVID results given. Patient results "NOT Detected." Caller expressed understanding. ° °

## 2019-04-22 ENCOUNTER — Other Ambulatory Visit: Admit: 2019-04-22 | Discharge: 2019-04-23 | Payer: MEDICARE

## 2019-04-22 ENCOUNTER — Ambulatory Visit: Admit: 2019-04-22 | Discharge: 2019-04-23 | Payer: MEDICARE

## 2019-04-22 DIAGNOSIS — C9110 Chronic lymphocytic leukemia of B-cell type not having achieved remission: Principal | ICD-10-CM

## 2019-04-26 ENCOUNTER — Ambulatory Visit: Admit: 2019-04-26 | Discharge: 2019-04-27 | Payer: MEDICARE

## 2019-04-28 ENCOUNTER — Other Ambulatory Visit: Payer: Self-pay | Admitting: Internal Medicine

## 2019-04-29 ENCOUNTER — Ambulatory Visit: Admit: 2019-04-29 | Discharge: 2019-04-30 | Payer: MEDICARE

## 2019-04-29 DIAGNOSIS — C9110 Chronic lymphocytic leukemia of B-cell type not having achieved remission: Principal | ICD-10-CM

## 2019-05-01 ENCOUNTER — Other Ambulatory Visit: Payer: Self-pay | Admitting: Internal Medicine

## 2019-05-01 DIAGNOSIS — N6452 Nipple discharge: Secondary | ICD-10-CM

## 2019-05-01 DIAGNOSIS — N644 Mastodynia: Secondary | ICD-10-CM

## 2019-05-03 ENCOUNTER — Ambulatory Visit: Admit: 2019-05-03 | Discharge: 2019-05-04 | Payer: MEDICARE

## 2019-05-06 ENCOUNTER — Ambulatory Visit: Admit: 2019-05-06 | Discharge: 2019-05-06 | Payer: MEDICARE | Attending: Adult Health | Primary: Adult Health

## 2019-05-06 ENCOUNTER — Ambulatory Visit: Admit: 2019-05-06 | Discharge: 2019-05-06 | Payer: MEDICARE

## 2019-05-06 ENCOUNTER — Other Ambulatory Visit: Admit: 2019-05-06 | Discharge: 2019-05-06 | Payer: MEDICARE

## 2019-05-06 DIAGNOSIS — D693 Immune thrombocytopenic purpura: Principal | ICD-10-CM

## 2019-05-06 DIAGNOSIS — C9110 Chronic lymphocytic leukemia of B-cell type not having achieved remission: Principal | ICD-10-CM

## 2019-05-06 DIAGNOSIS — B370 Candidal stomatitis: Principal | ICD-10-CM

## 2019-05-06 MED ORDER — NYSTATIN 100,000 UNIT/ML ORAL SUSPENSION
Freq: Four times a day (QID) | ORAL | 0 refills | 7.00000 days | Status: CP
Start: 2019-05-06 — End: 2019-05-13

## 2019-05-09 ENCOUNTER — Ambulatory Visit: Admit: 2019-05-09 | Discharge: 2019-05-10 | Payer: MEDICARE

## 2019-05-09 DIAGNOSIS — C9110 Chronic lymphocytic leukemia of B-cell type not having achieved remission: Principal | ICD-10-CM

## 2019-05-12 ENCOUNTER — Ambulatory Visit
Admission: RE | Admit: 2019-05-12 | Discharge: 2019-05-12 | Disposition: A | Discharge status: Home / Self Care | Payer: Medicare HMO | Source: Ambulatory Visit | Attending: Internal Medicine | Admitting: Internal Medicine

## 2019-05-12 DIAGNOSIS — N644 Mastodynia: Secondary | ICD-10-CM

## 2019-05-12 DIAGNOSIS — N6452 Nipple discharge: Secondary | ICD-10-CM

## 2019-05-13 ENCOUNTER — Ambulatory Visit: Admit: 2019-05-13 | Discharge: 2019-05-14 | Payer: MEDICARE

## 2019-05-13 DIAGNOSIS — C9110 Chronic lymphocytic leukemia of B-cell type not having achieved remission: Principal | ICD-10-CM

## 2019-05-16 ENCOUNTER — Ambulatory Visit: Admit: 2019-05-16 | Discharge: 2019-05-17 | Payer: MEDICARE

## 2019-05-16 DIAGNOSIS — C9110 Chronic lymphocytic leukemia of B-cell type not having achieved remission: Principal | ICD-10-CM

## 2019-05-17 ENCOUNTER — Ambulatory Visit: Admit: 2019-05-17 | Discharge: 2019-05-18 | Payer: MEDICARE

## 2019-05-21 ENCOUNTER — Ambulatory Visit: Admit: 2019-05-21 | Discharge: 2019-05-22 | Payer: MEDICARE

## 2019-05-21 DIAGNOSIS — C9110 Chronic lymphocytic leukemia of B-cell type not having achieved remission: Principal | ICD-10-CM

## 2019-05-27 ENCOUNTER — Ambulatory Visit: Admit: 2019-05-27 | Discharge: 2019-05-28 | Payer: MEDICARE

## 2019-05-27 DIAGNOSIS — C9110 Chronic lymphocytic leukemia of B-cell type not having achieved remission: Principal | ICD-10-CM

## 2019-06-03 ENCOUNTER — Other Ambulatory Visit: Admit: 2019-06-03 | Discharge: 2019-06-03 | Payer: MEDICARE

## 2019-06-03 ENCOUNTER — Ambulatory Visit: Admit: 2019-06-03 | Discharge: 2019-06-03 | Payer: MEDICARE | Attending: Adult Health | Primary: Adult Health

## 2019-06-03 ENCOUNTER — Ambulatory Visit: Admit: 2019-06-03 | Discharge: 2019-06-03 | Payer: MEDICARE

## 2019-06-03 DIAGNOSIS — R002 Palpitations: Principal | ICD-10-CM

## 2019-06-03 DIAGNOSIS — D693 Immune thrombocytopenic purpura: Principal | ICD-10-CM

## 2019-06-03 DIAGNOSIS — C9110 Chronic lymphocytic leukemia of B-cell type not having achieved remission: Principal | ICD-10-CM

## 2019-06-03 DIAGNOSIS — R0602 Shortness of breath: Principal | ICD-10-CM

## 2019-06-10 ENCOUNTER — Ambulatory Visit: Admit: 2019-06-10 | Discharge: 2019-06-10 | Payer: MEDICARE

## 2019-06-10 ENCOUNTER — Other Ambulatory Visit: Admit: 2019-06-10 | Discharge: 2019-06-10 | Payer: MEDICARE

## 2019-06-10 DIAGNOSIS — D179 Benign lipomatous neoplasm, unspecified: Principal | ICD-10-CM

## 2019-06-10 DIAGNOSIS — K449 Diaphragmatic hernia without obstruction or gangrene: Principal | ICD-10-CM

## 2019-06-10 DIAGNOSIS — C9110 Chronic lymphocytic leukemia of B-cell type not having achieved remission: Principal | ICD-10-CM

## 2019-06-10 DIAGNOSIS — K219 Gastro-esophageal reflux disease without esophagitis: Principal | ICD-10-CM

## 2019-06-17 ENCOUNTER — Ambulatory Visit: Admit: 2019-06-17 | Discharge: 2019-06-18 | Payer: MEDICARE

## 2019-06-17 DIAGNOSIS — C9110 Chronic lymphocytic leukemia of B-cell type not having achieved remission: Principal | ICD-10-CM

## 2019-06-24 ENCOUNTER — Ambulatory Visit: Admit: 2019-06-24 | Discharge: 2019-06-25 | Payer: MEDICARE

## 2019-06-24 DIAGNOSIS — C9110 Chronic lymphocytic leukemia of B-cell type not having achieved remission: Principal | ICD-10-CM

## 2019-07-02 ENCOUNTER — Ambulatory Visit
Admit: 2019-07-02 | Discharge: 2019-07-03 | Disposition: A | Discharge status: Home / Self Care | Payer: MEDICARE | Attending: Hematology

## 2019-07-02 ENCOUNTER — Emergency Department
Admit: 2019-07-02 | Discharge: 2019-07-03 | Disposition: A | Discharge status: Home / Self Care | Payer: MEDICARE | Attending: Hematology

## 2019-07-02 ENCOUNTER — Other Ambulatory Visit
Admit: 2019-07-02 | Discharge: 2019-07-03 | Disposition: A | Discharge status: Home / Self Care | Payer: MEDICARE | Attending: Hematology

## 2019-07-02 DIAGNOSIS — T451X5A Adverse effect of antineoplastic and immunosuppressive drugs, initial encounter: Principal | ICD-10-CM

## 2019-07-02 DIAGNOSIS — C9110 Chronic lymphocytic leukemia of B-cell type not having achieved remission: Principal | ICD-10-CM

## 2019-07-02 DIAGNOSIS — D693 Immune thrombocytopenic purpura: Principal | ICD-10-CM

## 2019-07-02 DIAGNOSIS — D6959 Other secondary thrombocytopenia: Principal | ICD-10-CM

## 2019-07-02 NOTE — Unmapped (Signed)
[facility] Visit    Patient Name: Katrina Gould  Patient Age: 79 y.o.  Encounter Date: 07/02/2019      Primary Care Provider:  Lauro Regulus, MD    Referring Physician:  Lauro Regulus, MD    REASON FOR VISIT:   79 y.o. female here to follow up for her CLL.    ASSESSMENT/PLAN     Katrina Gould is a 79 y.o.Mar[place] Kitchen female who presents for follow of her CLL/SLL, Rai stage 4. Disease markers: 46,XX,der(4)t(4;4)(p14;q21). IGHV unmutated (0%, 3-33*01). B93m 5.05. No TP53 mutation. High-risk by CLL-IPI (6 points).  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  She's not had any improvement of platelet counts, and seems to be refractory to platelets.  We will draw HLA labs today so we can try HLA matched platelet.  I'll contact the blood bank to ensure there are HLA matched platelets for her transfusions next week.      Will plan to meet with Dr. 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 Malen Gauze) for ITP.    Stop obinituzumab.    Plans and Recommendations:    1. 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 DOE and occasional rapid heart rate  - recommended contacting PCP to further evaluate.    4. Arthritis  - continues to use topical CBD oil and this has helped  - May continue prn tylenol but should avoid NSAIDs  - knows to avoid oral CBD oil    5. Health Maintenance:   - rec annual flu vaccine  - recommend COVID-19 vaccine when available, but will need platelets 50K or higher    Markus Jarvis, RN, MSN, AGPCNP-C  Nurse Practitioner  Hematologic Malignancies  [facility]  [phone] (phone)  [phone] (fax)  [email or web address]      I personally spent 45 minutes face-to-face and non-face-to-face in the care of this patient, which includes all pre, intra, and post visit time on the date of service.        INTERVAL HISTORY   Thrush with platelets - magic mouthwash worked  Energy is fair  Eating well.  She reports taht sometimes she gets nervous and nauseous at night. Takes sertraline and lorazepam at night and the next day.  She gets shaky/trembly around 7pm each night.  Has been going on for about 1.5 weeks.  No new lymph nodes  Occasional chills at night  No night sweats  Mild gum bleeding when brushing teeth    Otherwise, denies new bone pain, fevers, chills, night sweats, lumps/bumps, tongue swelling, shortness of breath, syncope, lightheadedness, constipation or diarrhea, nausea or vomiting, very easy bruising or bleeding, or urinary changes.       HEMATOLOGICAL / ONCOLOGICAL HISTORY:     Oncology History Overview Note   CLL    First seen in oncology clinic locally 03/31/16    Had a left axillary biopsy 03/15/16 that confirmed CLL/SLL: Flow showed monoclonal B-cell population with CD5, CD23, kappa light chain. IHC showed positive CD20 (diffuse), negative for cyclin D1.    No constitutional symptoms except some sweats  Labs: Hb 12.0, Hct 34.8, plt 147. WBC was 10.6 with ANC 2.5. ALC was 7.5K.    CLL FISH studies: normal panel. No cells with 11q, tri 12, 17p or 13q, and the 11;14 translocation also not detected.    Karyotype: 46,XX,der(4)t(4;4)(p14;q21)[3]/45,XX,dic(6;17)(q12;p11.2) (no loss of TP53 on FISH)    Molecular: No TP53 mutation    CLL-IPI: As per Lancet Oncol Vol 15 October 2014  Includes (1) TP53 status (no abnormalities vs. Del 17p and/or TP53 mutation - 4 points)=0, (2) IGHV mutation status (mut vs unmut - 2 points)=2 (3) serum B2-microglobulin (</=3.5 vs >3.5 - 2 points)=2 (4) clinical stage (Rai 0 vs Rai I-IV - 1 point)=1 and (5) age (</=65 vs >65 - 1 point)=1    Total: 6 points    Low risk (0-1 points)= 93.2% OS at 5 years  Int risk (2-3 points)= 79.3%  High risk (4-6 points)= 63.3%    Very high risk (7-10 points)= 23.3%     Received rituximab weekly x4 from 09/14/17 to 10/05/17  - For ITP 2/t CLL vs. CLL  - No response    Started ven/obi regimen on 10/23/17  Baseline counts:  WBC 45.9  ALC 2.6  Hb 12.5  Plt 10    03/22/18: BM bx without overt CLL, MRD testing at [facility] revealed CD5-positive, kappa light chain-restricted B-cell  population is detected. MRD = 0.32%.    05/15/18: Repeated BM bx given persistent low counts - ~1% CLL in otherwise normal appearing marrow. Myeloid mutation panel negative. Karyotype: 45,XX,dic(6;17)(q12;p11.2)[2]/46,XX[18].       CLL (chronic lymphocytic leukemia) (CMS-HCC)   03/15/2016 Initial Diagnosis    CLL (chronic lymphocytic leukemia) (RAF-HCC)     09/14/2017 - 10/05/2017 Chemotherapy    Rituximab weekly x4  No response     09/14/2017 - 10/05/2017 Chemotherapy    Rituximab x 4     10/23/2017 -  Chemotherapy    Began ven/obi     03/19/2018 Adverse Reaction    Obinutuzumab held given persistent thrombocytopenia. Marrow performed on 03/22/18, patient with no overt CLL (0.32% by MRD flow testing from [facility]). Do not plan to give 6th cycle of obi (unless counts recover in a reasonable timeframe).     03/22/2018 Biopsy    Bone marrow, right iliac, aspiration and biopsy  -  Normocellular bone marrow (30%) with erythroid-predominant trilineage hematopoiesis   -  Suspicious for low level (<5%) residual involvement by chronic lymphocytic leukemia in a background of reactive lymphoid aggregates  -  Routine cytogenetic results reveal a normal karyotype; FISH (CLL panel) results are normal in a limited analysis; see details below (case [id number]).  -  Flow cytometric CLL MRD results reveal a CD5-positive, kappa light chain-restricted B-cell population is detected; MRD = 0.32%; see details in the Referral Testing Report in the Media Tab.     04/09/2018 Adverse Reaction    ANC down to 0.2. Held venetoclax (was previously at 200 mg daily).     05/15/2018 Biopsy    Bone marrow, right iliac, aspiration and biopsy  -  Mildly hypercellular bone marrow (40%) with trilineage hematopoiesis and persistent low level (less than 1% of cells) involvement by chronic lymphocytic leukemia by flow cytometric analysis  -  Numerous reactive lymphoid aggregates  -  No significant morphologic dyspoiesis or increase in blasts  -  Routine cytogenetic results reveal an abnormal karyotype; FISH (CLL panel) results are normal; see details below (case [id number]).  -  Myeloid mutation panel results reveal no mutations were  identified in the 34 genes analyzed; see details below (case [id number]).     03/26/2019 Biopsy    Bone marrow, right iliac, aspiration and biopsy  -  Slightly hypercellular bone marrow (40%) with persistent low-level involvement by patient's known chronic lymphocytic leukemia, representing approximately 5% of marrow cells by PAX5 immunohistochemical stain.  -  Increased megakaryocytes identified on bone marrow aspirate smears (see Comment)  -  No significant morphologic dyspoiesis or increase in blasts.  -  Routine cytogenetic analysis is pending.  -  Myeloid mutation panel is pending.    Although not a specific finding, increased megakaryocytes can be seen in the setting of immune-mediated thrombocytopenia (ITP).     B-cell chronic lymphocytic leukemia (CMS-HCC)   09/05/2017 - 10/11/2017 Chemotherapy    Chemotherapy Treatment    Treatment Goal Control   Line of Treatment [No plan line of treatment]   Plan Name OP RITUXIMAB    Start Date 09/14/2017   End Date 10/05/2017   Provider Pernell Dupre, MD   Chemotherapy riTUXimab (RITUXAN) 712.5 mg in sodium chloride (NS) 0.9 % 500 mL IVPB, 375 mg/m2 = 712.5 mg, Intravenous, Once, 1 of 1 cycle  Administration: 712.5 mg (09/14/2017)  riTUXimab (RITUXAN) 712.5 mg in sodium chloride (NS) 0.9 % 250 mL rapid infusion, 375 mg/m2 = 712.5 mg, Intravenous, Once, 1 of 1 cycle  Administration: 712.5 mg (09/21/2017), 712.5 mg (09/28/2017), 712.5 mg (10/05/2017) 10/16/2017 Initial Diagnosis    Chronic lymphocytic leukemia, Rai stage IV (CMS-HCC)     10/23/2017 - 03/18/2018 Chemotherapy    OP OBINUTUZUMAB AND VENETOCLAX  chlorambucil 0.5 mg/kg PO on days 1, 15, obinutuzumab 100 mg IV on day 1, obinutuzumab 900 mg IV on day 2, obinutuzumab 1,000 mg IV on days 8,15 on Cycle 1, then obinutuzumab 1,000 mg IV on day 1 for sequent cycles, every 28 days.     04/07/2019 -  Chemotherapy    OP CLL OBINUTUZUMAB  chlorambucil 0.5 mg/kg PO on days 1, 15, obinutuzumab 100 mg IV on day 1, obinutuzumab 900 mg IV on day 2, obinutuzumab 1,000 mg IV on days 8,15 on Cycle 1, then obinutuzumab 1,000 mg IV on day 1 for sequent cycles, every 28 days.              OTHER PAST MEDICAL HISTORY:     Patient Active Problem List   Diagnosis   ??? CLL (chronic lymphocytic leukemia) (CMS-HCC)   ??? Chronic migraine without aura   ??? 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 Left bundle branch block (LBBB)   ??? Hypercholesterolemia   ??? Chest pain, unspecified   ??? GERD (gastroesophageal reflux disease)   ??? Osteoporosis   ??? Needs flu shot   ??? BRBPR (bright red blood per rectum)   ??? Other autoimmune hemolytic anemia   ??? B-cell chronic lymphocytic leukemia (CMS-HCC)   ??? Aortic atherosclerosis (CMS-HCC)   ??? Diverticulitis of colon   ??? Epigastric pain   ??? Gastritis, Helicobacter pylori   ??? H/O adenomatous polyp of colon   ??? Healthcare maintenance   ??? Lymphadenopathy   ??? Migraine without aura and without status migrainosus, not intractable   ??? Syncope and collapse   ??? White matter disease   ??? Vasovagal syncope   ??? Chemotherapy-induced neutropenia (CMS-HCC)   ??? Coccyx pain   ??? Chemotherapy induced diarrhea   ??? Chronic right shoulder pain   ??? Chemotherapy-induced thrombocytopenia   ??? Thrombocytopenia (CMS-HCC)   ??? Dyspnea on  exertion   ??? Idiopathic thrombocytopenic purpura (ITP) (CMS-HCC)   ??? Oral thrush   ??? Febrile nonhemolytic transfusion reaction       ALLERGIES:     Baclofen    MEDICATIONS: Outpatient Encounter Medications as of 07/02/2019   Medication Sig Dispense Refill   ??? fluticasone propion-salmeterol (ADVAIR) 100-50 mcg/dose diskus Inhale 2 puffs daily.      ??? lidocaine (LIDODERM) 5 % patch Place 1 patch on the skin daily.     ??? mirtazapine (REMERON) 7.5 MG tablet Take 7.5 mg by mouth nightly.     ??? multivitamin (TAB-A-VITE/THERAGRAN) per tablet Take 1 tablet by mouth daily.      ??? pantoprazole (PROTONIX) 40 MG tablet Take by mouth daily.      ??? sertraline (ZOLOFT) 50 MG tablet Take 50 mg by mouth daily.  1   ??? tiZANidine (ZANAFLEX) 2 MG tablet Take 1 tablet (2 mg total) by mouth nightly as needed. 30 tablet 0   ??? lidocaine-me.sal-menthol-camph (CBD-KINGS WITH LIDOCAINE) 4-9-1-1.2 % PtMd Apply 1 Dose topically Four (4) times a day.     ??? loperamide (IMODIUM A-D) 2 mg tablet Take 2 mg by mouth Three (3) times a day as needed for diarrhea.     ??? LORazepam (ATIVAN) 0.5 MG tablet Take 0.5 mg by mouth two (2) times a day as needed.      ??? mv-mn-vitC-asbNa-Glu-Lys-hc124 (AIRBORNE, ASCORBATE SODIUM,) 333-1.7 mg Chew Chew 2 tablets Three (3) times a day as needed (cold prevention).     ??? nystatin (MYCOSTATIN) 100,000 unit/mL suspension Take 500,000 Units by mouth Four (4) times a day.     ??? [DISCONTINUED] azelastine (ASTELIN) 137 mcg (0.1 %) nasal spray 2 sprays into each nostril.       Facility-Administered Encounter Medications as of 07/02/2019   Medication Dose Route Frequency Provider Last Rate Last Admin   ??? tbo-filgrastim (GRANIX) injection 480 mcg  480 mcg Subcutaneous Once Vernie Murders, [place]           Physical Exam:  Vitals:    07/02/19 1339   BP: 132/68   Pulse: 79   Temp: 36.7 ??C (98 ??F)       General: Resting in no apparent distress, accompanied by son  HEENT:  Clear sclera, conjunctiva, mask in place  LYMPH:  no palpable cervical, supraclavicular, axillary, or inguinal nodes  CARDAC: Occasional premature beat, RR, no R,M,Gs, no pitting peripheral edema  RESP: nonlabored, bilaterally CTA  GI: Soft, nontender, active bowel sounds, no hepatic or splenomegaly  NEURO: alert, 0x4, steady gait, no focal deficits  PSYCH: appropriate  DERM: no visible rashes, lesions.   LINE: PORT    Labs:    Results for orders placed or performed in visit on 07/02/19   Comprehensive Metabolic Panel   Result Value Ref Range    Sodium 140 135 - 145 mmol/L    Potassium 4.5 3.5 - 5.0 mmol/L    Chloride 107 98 - 107 mmol/L    Anion Gap 7 7 - 15 mmol/L    CO2 26.0 22.0 - 30.0 mmol/L    BUN 21 7 - 21 mg/dL    Creatinine 1.61 0.96 - 1.00 mg/dL    BUN/Creatinine Ratio 27     EGFR CKD-EPI Non-African American, Female 72 >=60 mL/min/1.29m2    EGFR CKD-EPI African American, Female 28 >=60 mL/min/1.16m2    Glucose 97 70 - 179 mg/dL    Calcium 8.7 8.5 - 04.5 mg/dL    Albumin 3.8  3.5 - 5.0 g/dL    Total Protein 6.4 (L) 6.5 - 8.3 g/dL    Total Bilirubin 0.7 0.0 - 1.2 mg/dL    AST 53 (H) 14 - 38 U/L    ALT 45 (H) <35 U/L    Alkaline Phosphatase 261 (H) 38 - 126 U/L   Magnesium Level   Result Value Ref Range    Magnesium 1.8 1.6 - 2.2 mg/dL   Phosphorus Level   Result Value Ref Range    Phosphorus 3.5 2.9 - 4.7 mg/dL   Lactate dehydrogenase   Result Value Ref Range    LDH 554 338 - 610 U/L   Uric acid   Result Value Ref Range    Uric Acid 6.0 3.0 - 6.5 mg/dL   Type and Screen   Result Value Ref Range    Antibody Screen NEG     ABO Grouping B NEG    CBC w/ Differential   Result Value Ref Range    WBC 4.5 4.5 - 11.0 10*9/L    RBC 4.66 4.00 - 5.20 10*12/L    HGB 12.4 12.0 - 16.0 g/dL    HCT 95.6 21.3 - 08.6 %    MCV 83.2 80.0 - 100.0 fL    MCH 26.6 26.0 - 34.0 pg    MCHC 31.9 31.0 - 37.0 g/dL    RDW 57.8 (H) 46.9 - 15.0 %    MPV 12.4 (H) 7.0 - 10.0 fL    Platelet 16 (L) 150 - 440 10*9/L    Variable HGB Concentration Slight (A) Not Present    Neutrophils % 49.8 %    Lymphocytes % 34.3 %    Monocytes % 10.1 %    Eosinophils % 1.9 %    Basophils % 0.6 %    Absolute Neutrophils 2.2 2.0 - 7.5 10*9/L    Absolute Lymphocytes 1.5 1.5 - 5.0 10*9/L Absolute Monocytes 0.5 0.2 - 0.8 10*9/L    Absolute Eosinophils 0.1 0.0 - 0.4 10*9/L    Absolute Basophils 0.0 0.0 - 0.1 10*9/L    Large Unstained Cells 3 0 - 4 %    Microcytosis Slight (A) Not Present    Anisocytosis Slight (A) Not Present    Hypochromasia Moderate (A) Not Present

## 2019-07-03 MED ORDER — METOPROLOL SUCCINATE ER 25 MG TABLET,EXTENDED RELEASE 24 HR
ORAL_TABLET | Freq: Every day | ORAL | 3 refills | 30.00000 days | Status: CP
Start: 2019-07-03 — End: ?

## 2019-07-04 MED ORDER — ATORVASTATIN 40 MG TABLET
ORAL_TABLET | Freq: Every day | ORAL | 3 refills | 30 days | Status: CP
Start: 2019-07-04 — End: ?

## 2019-07-07 ENCOUNTER — Other Ambulatory Visit: Admit: 2019-07-07 | Discharge: 2019-07-08 | Payer: MEDICARE

## 2019-07-07 ENCOUNTER — Ambulatory Visit: Admit: 2019-07-07 | Discharge: 2019-07-08 | Payer: MEDICARE | Attending: Adult Health | Primary: Adult Health

## 2019-07-07 DIAGNOSIS — D693 Immune thrombocytopenic purpura: Principal | ICD-10-CM

## 2019-07-07 DIAGNOSIS — C9110 Chronic lymphocytic leukemia of B-cell type not having achieved remission: Principal | ICD-10-CM

## 2019-07-07 NOTE — Unmapped (Signed)
[facility] Visit    Patient Name: Katrina Gould  Patient Age: 79 y.o.  Encounter Date: 07/07/2019      Primary Care Provider:  Lauro Regulus, MD    Referring Physician:  Lauro Regulus, MD    REASON FOR VISIT:   79 y.o. female here to follow up for her CLL.    ASSESSMENT/PLAN     Katrina Gould is a 79 y.o. female who presents for follow of her CLL/SLL, Rai stage 4. Disease markers: 46,XX,der(4)t(4;4)(p14;q21). IGHV unmutated (0%, 3-33*01). B77m 5.05. No TP53 mutation. High-risk by CLL-IPI (6 points).    [name] is here today in follow up of an ER visit after her platelet infusion last Wednesday.  She is doing well, though is considerably nervous to get her next transfusion.  She had chest pain and SOB with this and infusion sent her to ER for management.      She is here with her son, who is quite upset about where she was placed in the ER (among COVID patients when she is immunocompromised).  I reviewed that unfortunately, we do not have any input in terms of ER placement and it is at the discretion of the ER charge nurses and providers.    Today we discussed her upcoming transfusion and the need for HLA platelets.  Blood bank is aware of her upcoming transfusion tomorrow and Friday and has 2 units of HLA matches platelets available for her.  Infusion is aware to transfuse whether she meets requirements or not.  Reviewed with patient and son that this does not replace the need for treatment for the ITP, but will likely help keep her body from destroying the platelets and hopefully to reduce the reactions she has been having with them.    Reviewed upcoming treatment with RCD and why it is necessary.  She will meet with Dr. Pollyann Savoy next week prior to therapy and discuss more in detail.      Plans and Recommendations:    1. 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 Pollyann Savoy    2. 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 DOE and occasional rapid heart rate  - see above  - recommended contacting PCP to further evaluate.    4. Arthritis  - continues to use topical CBD oil and this has helped  - May continue prn tylenol but should avoid NSAIDs  - knows to avoid oral CBD oil  ??  5. Health Maintenance:   - rec annual flu vaccine  - recommend COVID-19 vaccine when available, but will need platelets 50K or higher    Markus Jarvis, RN, MSN, AGPCNP-C  Nurse Practitioner  Hematologic Malignancies  [facility]  [phone] (phone)  [phone] (fax)  [email or web address]      I personally spent 60 minutes face-to-face and non-face-to-face in the care of this patient, which includes all pre, intra, and post visit time on the date of service.      INTERVAL HISTORY   Sent to ER after infusion last week d/t platelet reaction.  CP and SOB.    Otherwise, denies new bone pain, fevers, chills, night sweats, lumps/bumps, tongue swelling, shortness of breath, syncope, lightheadedness, constipation or diarrhea, nausea or vomiting, very easy bruising or bleeding, or urinary changes.  HEMATOLOGICAL / ONCOLOGICAL HISTORY:     Oncology History Overview Note   CLL    First seen in oncology clinic locally 03/31/16    Had a left axillary biopsy 03/15/16 that confirmed CLL/SLL: Flow showed monoclonal B-cell population with CD5, CD23, kappa light chain. IHC showed positive CD20 (diffuse), negative for cyclin D1.    No constitutional symptoms except some sweats    Labs: Hb 12.0, Hct 34.8, plt 147. WBC was 10.6 with ANC 2.5. ALC was 7.5K.    CLL FISH studies: normal panel. No cells with 11q, tri 12, 17p or 13q, and the 11;14 translocation also not detected.    Karyotype: 46,XX,der(4)t(4;4)(p14;q21)[3]/45,XX,dic(6;17)(q12;p11.2) (no loss of TP53 on FISH)    Molecular: No TP53 mutation    CLL-IPI: As per Lancet Oncol Vol 15 October 2014  Includes (1) TP53 status (no abnormalities vs. Del 17p and/or TP53 mutation - 4 points)=0, (2) IGHV mutation status (mut vs unmut - 2 points)=2 (3) serum B2-microglobulin (</=3.5 vs >3.5 - 2 points)=2 (4) clinical stage (Rai 0 vs Rai I-IV - 1 point)=1 and (5) age (</=65 vs >65 - 1 point)=1    Total: 6 points    Low risk (0-1 points)= 93.2% OS at 5 years  Int risk (2-3 points)= 79.3%  High risk (4-6 points)= 63.3%    Very high risk (7-10 points)= 23.3%     Received rituximab weekly x4 from 09/14/17 to 10/05/17  - For ITP 2/t CLL vs. CLL  - No response    Started ven/obi regimen on 10/23/17  Baseline counts:  WBC 45.9  ALC 2.6  Hb 12.5  Plt 10    03/22/18: BM bx without overt CLL, MRD testing at [facility] revealed CD5-positive, kappa light chain-restricted B-cell  population is detected. MRD = 0.32%.    05/15/18: Repeated BM bx given persistent low counts - ~1% CLL in otherwise normal appearing marrow. Myeloid mutation panel negative. Karyotype: 45,XX,dic(6;17)(q12;p11.2)[2]/46,XX[18].       CLL (chronic lymphocytic leukemia) (CMS-HCC)   03/15/2016 Initial Diagnosis    CLL (chronic lymphocytic leukemia) (RAF-HCC)     09/14/2017 - 10/05/2017 Chemotherapy    Rituximab weekly x4  No response     09/14/2017 - 10/05/2017 Chemotherapy    Rituximab x 4     10/23/2017 -  Chemotherapy    Began ven/obi     03/19/2018 Adverse Reaction    Obinutuzumab held given persistent thrombocytopenia. Marrow performed on 03/22/18, patient with no overt CLL (0.32% by MRD flow testing from [facility]). Do not plan to give 6th cycle of obi (unless counts recover in a reasonable timeframe).     03/22/2018 Biopsy    Bone marrow, right iliac, aspiration and biopsy  -  Normocellular bone marrow (30%) with erythroid-predominant trilineage hematopoiesis   -  Suspicious for low level (<5%) residual involvement by chronic lymphocytic leukemia in a background of reactive lymphoid aggregates  -  Routine cytogenetic results reveal a normal karyotype; FISH (CLL panel) results are normal in a limited analysis; see details below (case [id number]).  -  Flow cytometric CLL MRD results reveal a CD5-positive, kappa light chain-restricted B-cell population is detected; MRD = 0.32%; see details in the Referral Testing Report in the Media Tab.     04/09/2018 Adverse Reaction    ANC down to 0.2. Held venetoclax (was previously at 200 mg daily).     05/15/2018 Biopsy    Bone marrow, right iliac, aspiration and biopsy  -  Mildly hypercellular bone marrow (40%) with trilineage  hematopoiesis and persistent low level (less than 1% of cells) involvement by chronic lymphocytic leukemia by flow cytometric analysis  -  Numerous reactive lymphoid aggregates  -  No significant morphologic dyspoiesis or increase in blasts  -  Routine cytogenetic results reveal an abnormal karyotype; FISH (CLL panel) results are normal; see details below (case [id number]).  -  Myeloid mutation panel results reveal no mutations were identified in the 34 genes analyzed; see details below (case [id number]).     03/26/2019 Biopsy    Bone marrow, right iliac, aspiration and biopsy  -  Slightly hypercellular bone marrow (40%) with persistent low-level involvement by patient's known chronic lymphocytic leukemia, representing approximately 5% of marrow cells by PAX5 immunohistochemical stain.  -  Increased megakaryocytes identified on bone marrow aspirate smears (see Comment)  -  No significant morphologic dyspoiesis or increase in blasts.  -  Routine cytogenetic analysis is pending.  -  Myeloid mutation panel is pending.    Although not a specific finding, increased megakaryocytes can be seen in the setting of immune-mediated thrombocytopenia (ITP).     B-cell chronic lymphocytic leukemia (CMS-HCC)   09/05/2017 - 10/11/2017 Chemotherapy    Chemotherapy Treatment    Treatment Goal Control   Line of Treatment [No plan line of treatment]   Plan Name OP RITUXIMAB    Start Date 09/14/2017   End Date 10/05/2017   Provider Pernell Dupre, MD   Chemotherapy riTUXimab (RITUXAN) 712.5 mg in sodium chloride (NS) 0.9 % 500 mL IVPB, 375 mg/m2 = 712.5 mg, Intravenous, Once, 1 of 1 cycle  Administration: 712.5 mg (09/14/2017)  riTUXimab (RITUXAN) 712.5 mg in sodium chloride (NS) 0.9 % 250 mL rapid infusion, 375 mg/m2 = 712.5 mg, Intravenous, Once, 1 of 1 cycle  Administration: 712.5 mg (09/21/2017), 712.5 mg (09/28/2017), 712.5 mg (10/05/2017)        10/16/2017 Initial Diagnosis    Chronic lymphocytic leukemia, Rai stage IV (CMS-HCC)     10/23/2017 - 03/18/2018 Chemotherapy    OP OBINUTUZUMAB AND VENETOCLAX  chlorambucil 0.5 mg/kg PO on days 1, 15, obinutuzumab 100 mg IV on day 1, obinutuzumab 900 mg IV on day 2, obinutuzumab 1,000 mg IV on days 8,15 on Cycle 1, then obinutuzumab 1,000 mg IV on day 1 for sequent cycles, every 28 days.     04/07/2019 - 06/30/2019 Chemotherapy    OP CLL OBINUTUZUMAB  chlorambucil 0.5 mg/kg PO on days 1, 15, obinutuzumab 100 mg IV on day 1, obinutuzumab 900 mg IV on day 2, obinutuzumab 1,000 mg IV on days 8,15 on Cycle 1, then obinutuzumab 1,000 mg IV on day 1 for sequent cycles, every 28 days.     07/16/2019 -  Chemotherapy    OP RCD CLL ITP  riTUXimab IV 375 mg/m2 on day 1, cyclophosphamide 750 mg/m2 IV on day 1, vinCRIStine 1.4 mg/m2 IV on day 1, predniSONE 100 mg PO on days 1-5, every 21 days.              OTHER PAST MEDICAL HISTORY:     Patient Active Problem List   Diagnosis   ??? CLL (chronic lymphocytic leukemia) (CMS-HCC)   ??? Chronic migraine without aura   ??? 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 Left bundle branch block (LBBB)   ??? Hypercholesterolemia   ??? Chest pain, unspecified   ??? GERD (gastroesophageal reflux disease)   ??? Osteoporosis   ??? Needs flu shot   ??? BRBPR (bright red blood per rectum)   ???  Other autoimmune hemolytic anemia   ??? B-cell chronic lymphocytic leukemia (CMS-HCC)   ??? Aortic atherosclerosis (CMS-HCC)   ??? Diverticulitis of colon   ??? Epigastric pain   ??? Gastritis, Helicobacter pylori   ??? H/O adenomatous polyp of colon   ??? Healthcare maintenance   ??? Lymphadenopathy   ??? Migraine without aura and without status migrainosus, not intractable   ??? Syncope and collapse   ??? White matter disease   ??? Vasovagal syncope   ??? Chemotherapy-induced neutropenia (CMS-HCC)   ??? Coccyx pain   ??? Chemotherapy induced diarrhea   ??? Chronic right shoulder pain   ??? Chemotherapy-induced thrombocytopenia   ??? Thrombocytopenia (CMS-HCC)   ??? Dyspnea on exertion   ??? Idiopathic thrombocytopenic purpura (ITP) (CMS-HCC)   ??? Oral thrush   ??? Febrile nonhemolytic transfusion reaction   ??? Transfusion reaction   ??? Exertional chest pain       ALLERGIES:     Baclofen    MEDICATIONS:       Outpatient Encounter Medications as of 07/07/2019   Medication Sig Dispense Refill   ??? atorvastatin (LIPITOR) 40 MG tablet Take 1 tablet (40 mg total) by mouth daily. 30 tablet 3   ??? fluticasone propion-salmeterol (ADVAIR) 100-50 mcg/dose diskus Inhale 2 puffs daily.      ??? lidocaine-me.sal-menthol-camph (CBD-KINGS WITH LIDOCAINE) 4-9-1-1.2 % PtMd Apply 1 Dose topically Four (4) times a day.     ??? loperamide (IMODIUM A-D) 2 mg tablet Take 2 mg by mouth Three (3) times a day as needed for diarrhea.     ??? LORazepam (ATIVAN) 0.5 MG tablet Take 0.5 mg by mouth nightly as needed for anxiety.      ??? metoprolol succinate (TOPROL-XL) 25 MG 24 hr tablet Take 1 tablet (25 mg total) by mouth daily. 30 tablet 3   ??? mirtazapine (REMERON) 7.5 MG tablet Take 7.5 mg by mouth nightly.     ??? multivitamin (TAB-A-VITE/THERAGRAN) per tablet Take 1 tablet by mouth daily.      ??? mv-mn-vitC-asbNa-Glu-Lys-hc124 (AIRBORNE, ASCORBATE SODIUM,) 333-1.7 mg Chew Chew 2 tablets Three (3) times a day as needed (cold prevention).     ??? pantoprazole (PROTONIX) 40 MG tablet Take by mouth daily.      ??? sertraline (ZOLOFT) 50 MG tablet Take 50 mg by mouth daily.  1   ??? tiZANidine (ZANAFLEX) 2 MG tablet Take 1 tablet (2 mg total) by mouth nightly as needed. 30 tablet 0   ??? lidocaine (LIDODERM) 5 % patch Place 1 patch on the skin daily.       Facility-Administered Encounter Medications as of 07/07/2019   Medication Dose Route Frequency Provider Last Rate Last Admin   ??? tbo-filgrastim (GRANIX) injection 480 mcg  480 mcg Subcutaneous Once Vernie Murders, [place]           Physical Exam:  Vitals:    07/07/19 1414   BP: 119/58   Pulse: 69   Resp: 18   Temp: 36.9 ??C (98.4 ??F)   SpO2: 98%       General: Resting in no apparent distress, accompanied by son  HEENT:  Clear sclera, conjunctiva, mask in place  LYMPH:  no palpable cervical, supraclavicular, axillary, or inguinal nodes  CARDAC: Occasional premature beat, RR, no R,M,Gs, no pitting peripheral edema  RESP: nonlabored, bilaterally CTA  GI: Soft, nontender, active bowel sounds, no hepatic or splenomegaly  NEURO: alert, 0x4, steady gait, no focal deficits  PSYCH: appropriate  DERM: no visible rashes, lesions.  LINE: PORT    Labs:    Results for orders placed or performed in visit on 07/07/19   Comprehensive Metabolic Panel   Result Value Ref Range    Sodium 140 135 - 145 mmol/L    Potassium 4.4 3.5 - 5.0 mmol/L    Chloride 107 98 - 107 mmol/L    Anion Gap 7 7 - 15 mmol/L    CO2 26.0 22.0 - 30.0 mmol/L    BUN 18 7 - 21 mg/dL    Creatinine 1.61 0.96 - 1.00 mg/dL    BUN/Creatinine Ratio 23     EGFR CKD-EPI Non-African American, Female 71 >=60 mL/min/1.19m2    EGFR CKD-EPI African American, Female 6 >=60 mL/min/1.24m2    Glucose 110 70 - 179 mg/dL    Calcium 8.7 8.5 - 04.5 mg/dL    Albumin 3.4 (L) 3.5 - 5.0 g/dL    Total Protein 5.8 (L) 6.5 - 8.3 g/dL    Total Bilirubin 0.7 0.0 - 1.2 mg/dL    AST 43 (H) 14 - 38 U/L    ALT 40 (H) <35 U/L    Alkaline Phosphatase 235 (H) 38 - 126 U/L   CBC w/ Differential   Result Value Ref Range    WBC 3.4 (L) 4.5 - 11.0 10*9/L    RBC 4.29 4.00 - 5.20 10*12/L    HGB 11.8 (L) 12.0 - 16.0 g/dL    HCT 40.9 (L) 81.1 - 46.0 %    MCV 82.8 80.0 - 100.0 fL    MCH 27.6 26.0 - 34.0 pg    MCHC 33.3 31.0 - 37.0 g/dL    RDW 91.4 (H) 78.2 - 15.0 %    MPV 11.2 (H) 7.0 - 10.0 fL    Platelet 26 (L) 150 - 440 10*9/L    Variable HGB Concentration Slight (A) Not Present    Neutrophils % 51.0 %    Lymphocytes % 34.5 %    Monocytes % 9.5 %    Eosinophils % 1.9 %    Basophils % 0.7 %    Absolute Neutrophils 1.7 (L) 2.0 - 7.5 10*9/L    Absolute Lymphocytes 1.2 (L) 1.5 - 5.0 10*9/L    Absolute Monocytes 0.3 0.2 - 0.8 10*9/L    Absolute Eosinophils 0.1 0.0 - 0.4 10*9/L    Absolute Basophils 0.0 0.0 - 0.1 10*9/L    Large Unstained Cells 3 0 - 4 %    Microcytosis Slight (A) Not Present    Anisocytosis Slight (A) Not Present    Hypochromasia Moderate (A) Not Present

## 2019-07-08 ENCOUNTER — Other Ambulatory Visit: Admit: 2019-07-08 | Discharge: 2019-07-09 | Payer: MEDICARE

## 2019-07-08 ENCOUNTER — Ambulatory Visit: Admit: 2019-07-08 | Discharge: 2019-07-09 | Payer: MEDICARE

## 2019-07-08 DIAGNOSIS — C9110 Chronic lymphocytic leukemia of B-cell type not having achieved remission: Principal | ICD-10-CM

## 2019-07-11 ENCOUNTER — Ambulatory Visit: Admit: 2019-07-11 | Discharge: 2019-07-12 | Payer: MEDICARE

## 2019-07-11 ENCOUNTER — Other Ambulatory Visit: Admit: 2019-07-11 | Discharge: 2019-07-12 | Payer: MEDICARE

## 2019-07-11 DIAGNOSIS — C9110 Chronic lymphocytic leukemia of B-cell type not having achieved remission: Principal | ICD-10-CM

## 2019-07-15 ENCOUNTER — Institutional Professional Consult (permissible substitution): Admit: 2019-07-15 | Discharge: 2019-07-16 | Payer: MEDICARE

## 2019-07-15 NOTE — Unmapped (Signed)
[facility] Visit    Patient Name: Katrina Gould  Patient Age: 79 y.o.  Encounter Date: 07/16/2019      Primary Care Provider:  Lauro Regulus, MD    Referring Physician:  Lauro Regulus, MD    REASON FOR VISIT:   79 y.o. female here to follow up for her CLL.    ASSESSMENT/PLAN     Katrina Gould is a 79 y.o. female who presents for follow of her CLL/SLL, Rai stage 4. Disease markers: 46,XX,der(4)t(4;4)(p14;q21). IGHV unmutated (0%, 3-33*01). B52m 5.05. No TP53 mutation. High-risk by CLL-IPI (6 points).    Platelet count downtrended despite the eltrombopag. BM bx 03/26/19 most consistent with ITP and CLL. After discussion with patient/son, stopped eltrombopag and started obinutuzumab 04/2019. Patient has now received 5 doses without improvement in platelets.     Given refractory nature of CLL-related ITP, will plan to start RCD today. In a small trial of RCD conducted in 8 CLL patients with steroid refractory AIHA, all 8 patients achieved remission Chales Abrahams et al, Leukemia 2002). While the patient has not yet been treated with a BTK inhibitor for CLL-directed treatment, given that the patient's thrombocytopenia is her major problem with a controlled lymphocyte count and very little marrow involvement by her CLL, we recommend RCD rather than BTK inhibitor given its effectiveness specifically for CLL-related autoimmune cytopenias. If no response to RCD, can trial BTK inhibitor.    RCD regimen is as follows:  -- rituximab 375 mg/m2 on D1  -- cyclophosphamide 750 mg/m2 on D1  -- dexamethasone 12 mg on D1-7    We reviewed the common side effects of this regimen including but not limited to infusion reaction, nausea, fever / infection, headache, rash and myelosuppression. We reviewed risk of Hep B reactivation and need for entecavir prophylaxis given her positive Hep B core antibody and low-positive Hep B viral load. She will start entecavir and will continue for 1 year s/p rituximab completion. We discussed the side effects associated with dexamethasone including peripheral edema, hypertension, hyperglycemia, insomnia and increased appetite. We asked her to let us know if she is having intolerable side effects from the dexamethasone and we will decrease the dose. She has not yet had COVID19 vaccine 2/2 thrombocytopenia so we have discussed risk of COVID19, as well as other infections, and need to continue to follow all CDC recommended precautions, including mask wearing, social distancing, frequent handwashing. We reviewed the need to call us with any fevers and need for possible admission if     We will plan to treat with RCD until best response (ideally platelets > 100K). Once best response, will stop. Will plan for a maximum of 6 cycles.     Plans and Recommendations:    1. CLL with CLL-related ITP  - Proceed with C1D1 RCD today Chales Abrahams et al, Leukemia 2002)  - low threshold to reduce dexamethasone dose if not tolerating  - start allopurinol 300 mg daily  - compazine, ativan prn  - weekly labs/transfusion support  - RTC 3 weeks for consideration of cycle 2    2. 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 Platelet transfusion reaction: Patient has had febrile non-hemolytic transfusion reaction x 2. Also had chest pain during reaction possibly related to underlying CAD.  -- infuse platelets more slowly per Transfusion Medicine  -- follows with  Cardiology  -- hold ASA while platelets < 50K  -- low threshold to reduce dexamethasone dose  -- monitor sxs    4. Prior Hep B infection / low-level viremia: Hep B core Ab +, HBV DNA 20. Given risk of Hep B reactivation with RTX, will start entecavir.   -- start entecavir 0.5 mg daily.   -- Continue x 1 year s/p RTX    5. Chest pain: Recent TTE with EF > 55%. Cardiac cath not performed 2/2 thrombocytopenia but stress test recommended when platelets improve.   -- Cardiology follow up.     6. DOE and occasional rapid heart rate  - stable and chronic  - recommended contacting PCP to further evaluate.    7. Arthritis  - continues to use topical CBD oil and this has helped  - May continue prn tylenol but should avoid NSAIDs  - knows to avoid oral CBD oil  ??  8. Health Maintenance:   - rec annual flu vaccine  - recommend COVID-19 vaccine when available, but will need platelets 50K or higher    Patient seen and discussed with Dr. Malen Gauze.     Starr Sinclair, MD  PGY-6  Hematology/Oncology Fellow    INTERVAL HISTORY     Patient accompanied by her son today.     Patient had recent admission for platelet transfusion reaction. Transfusion medicine deemed febrile non hemolytic transfusion reaction. Recommendation to transfuse platelets more slowly in future. Recently completed a course of obinutuzumab (4 weekly doses 04/08/2019 - 05/06/2019) with 1 additional dose 06/03/19. Platelets have remained < 30K since 03/2019. Bone marrow biopsy 03/26/19 revealed slightly hypercellular marrow with persistent low-level involvement by patient's CLL (representing 5% of marrow cells) with increased megakaryocytes c/w ITP.     Patient reports feeling generally well. No bruising or bleeding other than very small amount of blood when brushing her teeth. She is currently receiving platelet transfusions every week. Her current platelet transfusion threshold is 20K given that she requires HLA-matched platelets. She denies fevers or night sweats. Occasional chills. No diarrhea or constipation. Occasional palpitations and chronic DOE but no chest pain.     Otherwise, denies new bone pain, fevers, chills, night sweats, lumps/bumps, tongue swelling,  syncope, lightheadedness, constipation or diarrhea, nausea or vomiting, very easy bruising or bleeding, or urinary changes.       HEMATOLOGICAL / ONCOLOGICAL HISTORY:     Oncology History Overview Note   CLL    First seen in oncology clinic locally 03/31/16    Had a left axillary biopsy 03/15/16 that confirmed CLL/SLL: Flow showed monoclonal B-cell population with CD5, CD23, kappa light chain. IHC showed positive CD20 (diffuse), negative for cyclin D1.    No constitutional symptoms except some sweats    Labs: Hb 12.0, Hct 34.8, plt 147. WBC was 10.6 with ANC 2.5. ALC was 7.5K.    CLL FISH studies: normal panel. No cells with 11q, tri 12, 17p or 13q, and the 11;14 translocation also not detected.    Karyotype: 46,XX,der(4)t(4;4)(p14;q21)[3]/45,XX,dic(6;17)(q12;p11.2) (no loss of TP53 on FISH)    Molecular: No TP53 mutation    CLL-IPI: As per Lancet Oncol Vol 15 October 2014  Includes (1) TP53 status (no abnormalities vs. Del 17p and/or TP53 mutation - 4 points)=0, (2) IGHV mutation status (mut vs unmut - 2 points)=2 (3) serum B2-microglobulin (</=3.5 vs >3.5 - 2 points)=2 (4) clinical stage (Rai 0 vs Rai I-IV - 1 point)=1 and (5) age (</=65 vs >65 - 1 point)=1  Total: 6 points    Low risk (0-1 points)= 93.2% OS at 5 years  Int risk (2-3 points)= 79.3%  High risk (4-6 points)= 63.3%    Very high risk (7-10 points)= 23.3%     Received rituximab weekly x4 from 09/14/17 to 10/05/17  - For ITP 2/t CLL vs. CLL  - No response    Started ven/obi regimen on 10/23/17  Baseline counts:  WBC 45.9  ALC 2.6  Hb 12.5  Plt 10    03/22/18: BM bx without overt CLL, MRD testing at [facility] revealed CD5-positive, kappa light chain-restricted B-cell  population is detected. MRD = 0.32%.    05/15/18: Repeated BM bx given persistent low counts - ~1% CLL in otherwise normal appearing marrow. Myeloid mutation panel negative. Karyotype: 45,XX,dic(6;17)(q12;p11.2)[2]/46,XX[18].       CLL (chronic lymphocytic leukemia) (CMS-HCC)   03/15/2016 Initial Diagnosis    CLL (chronic lymphocytic leukemia) (RAF-HCC)     09/14/2017 - 10/05/2017 Chemotherapy    Rituximab weekly x4  No response     09/14/2017 - 10/05/2017 Chemotherapy    Rituximab x 4     10/23/2017 -  Chemotherapy    Began ven/obi 03/19/2018 Adverse Reaction    Obinutuzumab held given persistent thrombocytopenia. Marrow performed on 03/22/18, patient with no overt CLL (0.32% by MRD flow testing from [facility]). Do not plan to give 6th cycle of obi (unless counts recover in a reasonable timeframe).     03/22/2018 Biopsy    Bone marrow, right iliac, aspiration and biopsy  -  Normocellular bone marrow (30%) with erythroid-predominant trilineage hematopoiesis   -  Suspicious for low level (<5%) residual involvement by chronic lymphocytic leukemia in a background of reactive lymphoid aggregates  -  Routine cytogenetic results reveal a normal karyotype; FISH (CLL panel) results are normal in a limited analysis; see details below (case [id number]).  -  Flow cytometric CLL MRD results reveal a CD5-positive, kappa light chain-restricted B-cell population is detected; MRD = 0.32%; see details in the Referral Testing Report in the Media Tab.     04/09/2018 Adverse Reaction    ANC down to 0.2. Held venetoclax (was previously at 200 mg daily).     05/15/2018 Biopsy    Bone marrow, right iliac, aspiration and biopsy  -  Mildly hypercellular bone marrow (40%) with trilineage hematopoiesis and persistent low level (less than 1% of cells) involvement by chronic lymphocytic leukemia by flow cytometric analysis  -  Numerous reactive lymphoid aggregates  -  No significant morphologic dyspoiesis or increase in blasts  -  Routine cytogenetic results reveal an abnormal karyotype; FISH (CLL panel) results are normal; see details below (case [id number]).  -  Myeloid mutation panel results reveal no mutations were identified in the 34 genes analyzed; see details below (case [id number]).     03/26/2019 Biopsy    Bone marrow, right iliac, aspiration and biopsy  -  Slightly hypercellular bone marrow (40%) with persistent low-level involvement by patient's known chronic lymphocytic leukemia, representing approximately 5% of marrow cells by PAX5 immunohistochemical stain.  - Increased megakaryocytes identified on bone marrow aspirate smears (see Comment)  -  No significant morphologic dyspoiesis or increase in blasts.  -  Routine cytogenetic analysis is pending.  -  Myeloid mutation panel is pending.    Although not a specific finding, increased megakaryocytes can be seen in the setting of immune-mediated thrombocytopenia (ITP).     B-cell chronic lymphocytic leukemia (CMS-HCC)   09/05/2017 - 10/11/2017 Chemotherapy  Chemotherapy Treatment    Treatment Goal Control   Line of Treatment [No plan line of treatment]   Plan Name OP RITUXIMAB    Start Date 09/14/2017   End Date 10/05/2017   Provider Pernell Dupre, MD   Chemotherapy riTUXimab (RITUXAN) 712.5 mg in sodium chloride (NS) 0.9 % 500 mL IVPB, 375 mg/m2 = 712.5 mg, Intravenous, Once, 1 of 1 cycle  Administration: 712.5 mg (09/14/2017)  riTUXimab (RITUXAN) 712.5 mg in sodium chloride (NS) 0.9 % 250 mL rapid infusion, 375 mg/m2 = 712.5 mg, Intravenous, Once, 1 of 1 cycle  Administration: 712.5 mg (09/21/2017), 712.5 mg (09/28/2017), 712.5 mg (10/05/2017)        10/16/2017 Initial Diagnosis    Chronic lymphocytic leukemia, Rai stage IV (CMS-HCC)     10/23/2017 - 03/18/2018 Chemotherapy    OP OBINUTUZUMAB AND VENETOCLAX  chlorambucil 0.5 mg/kg PO on days 1, 15, obinutuzumab 100 mg IV on day 1, obinutuzumab 900 mg IV on day 2, obinutuzumab 1,000 mg IV on days 8,15 on Cycle 1, then obinutuzumab 1,000 mg IV on day 1 for sequent cycles, every 28 days.     04/07/2019 - 06/30/2019 Chemotherapy    OP CLL OBINUTUZUMAB  chlorambucil 0.5 mg/kg PO on days 1, 15, obinutuzumab 100 mg IV on day 1, obinutuzumab 900 mg IV on day 2, obinutuzumab 1,000 mg IV on days 8,15 on Cycle 1, then obinutuzumab 1,000 mg IV on day 1 for sequent cycles, every 28 days.     07/17/2019 -  Chemotherapy    OP RCD CLL ITP  riTUXimab IV 375 mg/m2 on day 1, cyclophosphamide 750 mg/m2 IV on day 1, vinCRIStine 1.4 mg/m2 IV on day 1, predniSONE 100 mg PO on days 1-5, every 21 days.              OTHER PAST MEDICAL HISTORY:     Patient Active Problem List   Diagnosis   ??? CLL (chronic lymphocytic leukemia) (CMS-HCC)   ??? Chronic migraine without aura   ??? 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 Left bundle branch block (LBBB)   ??? Hypercholesterolemia   ??? Chest pain, unspecified   ??? GERD (gastroesophageal reflux disease)   ??? Osteoporosis   ??? Needs flu shot   ??? BRBPR (bright red blood per rectum)   ??? Other autoimmune hemolytic anemia   ??? B-cell chronic lymphocytic leukemia (CMS-HCC)   ??? Aortic atherosclerosis (CMS-HCC)   ??? Diverticulitis of colon   ??? Epigastric pain   ??? Gastritis, Helicobacter pylori   ??? H/O adenomatous polyp of colon   ??? Healthcare maintenance   ??? Lymphadenopathy   ??? Migraine without aura and without status migrainosus, not intractable   ??? Syncope and collapse   ??? White matter disease   ??? Vasovagal syncope   ??? Chemotherapy-induced neutropenia (CMS-HCC)   ??? Coccyx pain   ??? Chemotherapy induced diarrhea   ??? Chronic right shoulder pain   ??? Chemotherapy-induced thrombocytopenia   ??? Thrombocytopenia (CMS-HCC)   ??? Dyspnea on exertion   ??? Idiopathic thrombocytopenic purpura (ITP) (CMS-HCC)   ??? Oral thrush   ??? Febrile nonhemolytic transfusion reaction   ??? Transfusion reaction   ??? Exertional chest pain       ALLERGIES:     Baclofen    MEDICATIONS:       Outpatient Encounter Medications as of 07/16/2019   Medication Sig Dispense Refill   ??? atorvastatin (LIPITOR) 40 MG tablet Take 1 tablet (40 mg  total) by mouth daily. 30 tablet 3   ??? fluticasone propion-salmeterol (ADVAIR) 100-50 mcg/dose diskus Inhale 2 puffs daily.      ??? lidocaine (LIDODERM) 5 % patch Place 1 patch on the skin daily.     ??? lidocaine-me.sal-menthol-camph (CBD-KINGS WITH LIDOCAINE) 4-9-1-1.2 % PtMd Apply 1 Dose topically Four (4) times a day.     ??? loperamide (IMODIUM A-D) 2 mg tablet Take 2 mg by mouth Three (3) times a day as needed for diarrhea.     ??? LORazepam (ATIVAN) 0.5 MG tablet Take 0.5 mg by mouth nightly as needed for anxiety.      ??? metoprolol succinate (TOPROL-XL) 25 MG 24 hr tablet Take 1 tablet (25 mg total) by mouth daily. 30 tablet 3   ??? mirtazapine (REMERON) 7.5 MG tablet Take 7.5 mg by mouth nightly.     ??? multivitamin (TAB-A-VITE/THERAGRAN) per tablet Take 1 tablet by mouth daily.      ??? mv-mn-vitC-asbNa-Glu-Lys-hc124 (AIRBORNE, ASCORBATE SODIUM,) 333-1.7 mg Chew Chew 2 tablets Three (3) times a day as needed (cold prevention).     ??? pantoprazole (PROTONIX) 40 MG tablet Take by mouth daily.      ??? sertraline (ZOLOFT) 50 MG tablet Take 50 mg by mouth daily.  1   ??? tiZANidine (ZANAFLEX) 2 MG tablet Take 1 tablet (2 mg total) by mouth nightly as needed. 30 tablet 0     Facility-Administered Encounter Medications as of 07/16/2019   Medication Dose Route Frequency Provider Last Rate Last Admin   ??? tbo-filgrastim (GRANIX) injection 480 mcg  480 mcg Subcutaneous Once Vernie Murders, [place]           Physical Exam:  Vitals:    07/16/19 0800   BP: 119/72   Pulse: 63   Resp: 16   Temp: 36.2 ??C (97.2 ??F)   SpO2: 95%       General: Well-appearing older female in no acute distress, accompanied by son  HEENT:  Clear sclera, conjunctiva, mask in place  LYMPH:  no palpable cervical, supraclavicular, axillary, or inguinal nodes  CARDAC: RRR, no M/R/Gs, no pitting peripheral edema  RESP: nonlabored, bilaterally CTA  GI: Soft, nontender, active bowel sounds, no hepatosplenomegaly  NEURO: A&Ox4, steady gait, no focal deficits  PSYCH: appropriate  DERM: no visible rashes, lesions.   LINE: PORT    Labs:    Results for orders placed or performed in visit on 07/15/19   Comprehensive Metabolic Panel   Result Value Ref Range    Sodium 141 135 - 145 mmol/L    Potassium 4.5 3.5 - 5.0 mmol/L    Chloride 106 98 - 107 mmol/L    Anion Gap 11 7 - 15 mmol/L    CO2 24.0 22.0 - 30.0 mmol/L    BUN 14 7 - 21 mg/dL    Creatinine 1.61 0.96 - 1.00 mg/dL    BUN/Creatinine Ratio 18     EGFR CKD-EPI Non-African American, Female 70 >=60 mL/min/1.100m2    EGFR CKD-EPI African American, Female 44 >=60 mL/min/1.87m2    Glucose 127 70 - 179 mg/dL    Calcium 9.0 8.5 - 04.5 mg/dL    Albumin 3.6 3.5 - 5.0 g/dL    Total Protein 6.0 (L) 6.5 - 8.3 g/dL    Total Bilirubin 0.8 0.0 - 1.2 mg/dL    AST 70 (H) 14 - 38 U/L    ALT 58 (H) <35 U/L    Alkaline Phosphatase 331 (H) 38 - 126 U/L  CBC w/ Differential   Result Value Ref Range    WBC 3.2 (L) 3.5 - 10.5 10*9/L    RBC 4.37 3.90 - 5.03 10*12/L    HGB 11.5 (L) 12.0 - 15.5 g/dL    HCT 16.1 (L) 09.6 - 44.0 %    MCV 79.3 (L) 82.0 - 98.0 fL    MCH 26.4 26.0 - 34.0 pg    MCHC 33.3 30.0 - 36.0 g/dL    RDW 04.5 (H) 40.9 - 15.0 %    MPV 9.8 7.0 - 10.0 fL    Platelet 24 (L) 150 - 450 10*9/L    nRBC 0 <=4 /100 WBCs    Neutrophils % 47.6 %    Lymphocytes % 33.4 %    Monocytes % 13.7 %    Eosinophils % 4.3 %    Basophils % 1.0 %    Absolute Neutrophils 1.5 (L) 1.7 - 7.7 10*9/L    Absolute Lymphocytes 1.1 0.7 - 4.0 10*9/L    Absolute Monocytes 0.4 0.1 - 1.0 10*9/L    Absolute Eosinophils 0.1 0.0 - 0.7 10*9/L    Absolute Basophils 0.0 0.0 - 0.1 10*9/L

## 2019-07-16 ENCOUNTER — Ambulatory Visit: Admit: 2019-07-16 | Discharge: 2019-07-17 | Payer: MEDICARE

## 2019-07-16 DIAGNOSIS — C9110 Chronic lymphocytic leukemia of B-cell type not having achieved remission: Principal | ICD-10-CM

## 2019-07-16 MED ORDER — ALLOPURINOL 100 MG TABLET
ORAL_TABLET | Freq: Every day | ORAL | 11 refills | 30.00000 days | Status: CP
Start: 2019-07-16 — End: 2020-07-15

## 2019-07-16 MED ORDER — ALLOPURINOL 100 MG TABLET: 300 mg | tablet | Freq: Every day | 11 refills | 30 days | Status: AC

## 2019-07-16 MED ORDER — DEXAMETHASONE 4 MG TABLET
ORAL_TABLET | INTRAMUSCULAR | 5 refills | 0.00000 days | Status: CP
Start: 2019-07-16 — End: ?

## 2019-07-16 MED ORDER — ENTECAVIR 0.5 MG TABLET
ORAL_TABLET | Freq: Every day | ORAL | 11 refills | 30.00000 days | Status: CP
Start: 2019-07-16 — End: ?
  Filled 2019-07-17: qty 30, 30d supply, fill #0

## 2019-07-16 MED ORDER — LORAZEPAM 1 MG TABLET
ORAL_TABLET | Freq: Four times a day (QID) | ORAL | 0 refills | 8.00000 days | Status: CP | PRN
Start: 2019-07-16 — End: ?

## 2019-07-16 MED ORDER — PROCHLORPERAZINE MALEATE 10 MG TABLET
ORAL_TABLET | Freq: Four times a day (QID) | ORAL | 2 refills | 8.00000 days | Status: CP | PRN
Start: 2019-07-16 — End: ?

## 2019-07-17 MED FILL — ENTECAVIR 0.5 MG TABLET: 30 days supply | Qty: 30 | Fill #0 | Status: AC

## 2019-07-17 NOTE — Unmapped (Signed)
Price is actually $0 with grant.

## 2019-07-17 NOTE — Unmapped (Signed)
[facility]   Patient Onboarding/Medication Counseling    Katrina Gould is a 79 y.o. female with CLL of B-cell type who I am counseling today on initiation of therapy due to past Hepatitis B infection.  I am speaking to the patient.    Was a Nurse, learning disability used for this call? No    Verified patient's date of birth / HIPAA.    Specialty medication(s) to be sent: Hematology/Oncology: entecavir      Non-specialty medications/supplies to be sent: n/a      Medications not needed at this time: n/a         Entecavir  0.5mg  tablets    Medication & Administration     Dosage: Take one tablet by mouth daily    Administration: Take 1 tablet daily on an empty stomach (2 hours before or 2 hours after eating)    Adherence/Missed dose instructions: take missed dose as soon as you remember. If it is close to the time of your next dose, skip the dose and resume with your next scheduled dose.    Goals of Therapy     The goal is to keep HBV levels non-detectable on lab tests    Side Effects & Monitoring Parameters     Common Side Effects:     ?? Headache  ?? Dizziness  ?? Feeling tired or weak  ?? Upset stomach    The following side effects should be reported to the provider:    ?? Signs of an allergic reaction, like rash; hives; itching; red, swollen, blistered, or peeling skin with or without fever; wheezing; tightness in the chest or throat; trouble breathing, swallowing, or talking; unusual hoarseness; or swelling of the mouth, face, lips, tongue, or throat.  ?? Signs of liver problems like dark urine, feeling tired, not hungry, upset stomach or stomach pain, light-colored stools, throwing up, or yellow skin or eyes.  ?? Signs of too much lactic acid in the blood (lactic acidosis) like fast breathing, fast heartbeat, a heartbeat that does not feel normal, very bad upset stomach or throwing up, feeling very sleepy, shortness of breath, feeling very tired or weak, very bad dizziness, feeling cold, or muscle pain or cramps Monitoring Parameters:    ?? HBV DNA (HBV DNA usually done every 3 months until undetectable and then every 3 to 6 months thereafter)  ?? ALT   ?? HBeAg  ?? anti-HBe   ?? HBsAg  ?? Renal function at baseline and at least annually; monitor renal function more frequently in patients at high risk of renal dysfunction           Contraindications, Warnings, & Precautions     ?? Lactic acidosis/hepatomegaly: [US Boxed Warning]: Lactic acidosis and severe hepatomegaly with steatosis (including fatal cases) have been reported with nucleoside analogue inhibitors; use with caution in patients with risk factors for liver disease (risk may be increased with female gender, decompensated liver disease, obesity, or prolonged nucleoside inhibitor exposure) and suspend treatment in any patient who develops clinical or laboratory findings suggestive of lactic acidosis or hepatotoxicity (transaminase elevation may/may not accompany hepatomegaly and steatosis).  ?? HIV: [US Boxed Warning]: May cause the development of HIV resistance in chronic hepatitis B patients with unrecognized or untreated HIV infection. Determine HIV status prior to initiating treatment with entecavir. Not recommended for HIV/HBV coinfected patients unless also receiving antiretroviral therapy. The manufacturer's labeling states that entecavir does not exhibit any clinically-relevant activity against human immunodeficiency virus (HIV type 1). However, a  small number of case reports have indicated declines in virus levels during entecavir therapy. HIV resistance to a common HIV drug has been reported in an HIV/HBV-infected patient receiving entecavir as monotherapy for HBV.      Drug/Food Interactions     ? Medication list reviewed in Epic. The patient was instructed to inform the care team before taking any new medications or supplements. No drug interactions identified.   ? Food decreases absorption. Must be taken on empty stomach      Storage, Handling Precautions, & Disposal     ? Store at room temperature in a dry place. Do not store in a bathroom.  ? Keep lid tightly closed.  ? Store in the original container to protect from light.  ? Keep all drugs in a safe place. Keep all drugs out of the reach of children and pets.  ? Throw away unused or expired drugs. Do not flush down a toilet or pour down a drain unless you are told to do so. Check with your pharmacist if you have questions about the best way to throw out drugs. There may be drug take-back programs in your area.      Current Medications (including OTC/herbals), Comorbidities and Allergies     Current Outpatient Medications   Medication Sig Dispense Refill   ??? allopurinoL (ZYLOPRIM) 100 MG tablet Take 3 tablets (300 mg total) by mouth daily. 90 tablet 11   ??? atorvastatin (LIPITOR) 40 MG tablet Take 1 tablet (40 mg total) by mouth daily. 30 tablet 3   ??? dexAMETHasone (DECADRON) 4 MG tablet Take 12 mg (3 tablets) by mouth in the morning once daily on Days 2-7 of each cycle 18 tablet 5   ??? entecavir (BARACLUDE) 0.5 MG tablet Take 1 tablet (0.5 mg total) by mouth daily. 30 tablet 11   ??? fluticasone propion-salmeterol (ADVAIR) 100-50 mcg/dose diskus Inhale 2 puffs daily.      ??? lidocaine (LIDODERM) 5 % patch Place 1 patch on the skin daily.     ??? lidocaine-me.sal-menthol-camph (CBD-KINGS WITH LIDOCAINE) 4-9-1-1.2 % PtMd Apply 1 Dose topically Four (4) times a day.     ??? loperamide (IMODIUM A-D) 2 mg tablet Take 2 mg by mouth Three (3) times a day as needed for diarrhea.     ??? LORazepam (ATIVAN) 0.5 MG tablet Take 0.5 mg by mouth nightly as needed for anxiety.      ??? LORazepam (ATIVAN) 1 MG tablet Take 1 tablet (1 mg total) by mouth every six (6) hours as needed (nausea/vomiting or insomnia). 30 tablet 0   ??? metoprolol succinate (TOPROL-XL) 25 MG 24 hr tablet Take 1 tablet (25 mg total) by mouth daily. 30 tablet 3   ??? mirtazapine (REMERON) 7.5 MG tablet Take 7.5 mg by mouth nightly.     ??? multivitamin (TAB-A-VITE/THERAGRAN) per tablet Take 1 tablet by mouth daily.      ??? mv-mn-vitC-asbNa-Glu-Lys-hc124 (AIRBORNE, ASCORBATE SODIUM,) 333-1.7 mg Chew Chew 2 tablets Three (3) times a day as needed (cold prevention).     ??? pantoprazole (PROTONIX) 40 MG tablet Take by mouth daily.      ??? prochlorperazine (COMPAZINE) 10 MG tablet Take 1 tablet (10 mg total) by mouth every six (6) hours as needed (nausea). 30 tablet 2   ??? sertraline (ZOLOFT) 50 MG tablet Take 50 mg by mouth daily.  1   ??? tiZANidine (ZANAFLEX) 2 MG tablet Take 1 tablet (2 mg total) by mouth nightly as needed. 30 tablet 0  No current facility-administered medications for this visit.      Facility-Administered Medications Ordered in Other Visits   Medication Dose Route Frequency Provider Last Rate Last Admin   ??? tbo-filgrastim (GRANIX) injection 480 mcg  480 mcg Subcutaneous Once Vernie Murders, AGNP           Allergies   Allergen Reactions   ??? Baclofen      made me go crazy       Patient Active Problem List   Diagnosis   ??? CLL (chronic lymphocytic leukemia) (CMS-HCC)   ??? Chronic migraine without aura   ??? 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 Left bundle branch block (LBBB)   ??? Hypercholesterolemia   ??? Chest pain, unspecified   ??? GERD (gastroesophageal reflux disease)   ??? Osteoporosis   ??? Needs flu shot   ??? BRBPR (bright red blood per rectum)   ??? Other autoimmune hemolytic anemia   ??? B-cell chronic lymphocytic leukemia (CMS-HCC)   ??? Aortic atherosclerosis (CMS-HCC)   ??? Diverticulitis of colon   ??? Epigastric pain   ??? Gastritis, Helicobacter pylori   ??? H/O adenomatous polyp of colon   ??? Healthcare maintenance   ??? Lymphadenopathy   ??? Migraine without aura and without status migrainosus, not intractable   ??? Syncope and collapse   ??? White matter disease   ??? Vasovagal syncope   ??? Chemotherapy-induced neutropenia (CMS-HCC)   ??? Coccyx pain   ??? Chemotherapy induced diarrhea   ??? Chronic right shoulder pain   ??? Chemotherapy-induced thrombocytopenia   ??? Thrombocytopenia (CMS-HCC)   ??? Dyspnea on exertion   ??? Idiopathic thrombocytopenic purpura (ITP) (CMS-HCC)   ??? Oral thrush   ??? Febrile nonhemolytic transfusion reaction   ??? Transfusion reaction   ??? Exertional chest pain       Reviewed and up to date in Epic.    Appropriateness of Therapy     Is medication and dose appropriate based on diagnosis? Yes    Prescription has been clinically reviewed: Yes    Baseline Quality of Life Assessment      How many days over the past month did your cancer keep you from your normal activities? For example, brushing your teeth or getting up in the morning. 0    Financial Information     Medication Assistance provided: [employer] Assistance    Anticipated copay of $0 reviewed with patient. Verified delivery address.    Delivery Information     Scheduled delivery date: 3/18    Expected start date: 3/18    Medication will be delivered via Same Day Courier to the prescription address in [facility].  This shipment will not require a signature.      Explained the services we provide at [facility] and that each month we would call to set up refills.  Stressed importance of returning phone calls so that we could ensure they receive their medications in time each month.  Informed patient that we should be setting up refills 7-10 days prior to when they will run out of medication.  A pharmacist will reach out to perform a clinical assessment periodically.  Informed patient that a welcome packet and a drug information handout will be sent.      Patient verbalized understanding of the above information as well as how to contact the pharmacy at [phone] option 4 with any questions/concerns.  The pharmacy is open Monday through Friday 8:30am-4:30pm.  A pharmacist  is available 24/7 via pager to answer any clinical questions they may have.    Patient Specific Needs     ? Does the patient have any physical, cognitive, or cultural barriers? No    ? Patient prefers to have medications discussed with  Patient     ? Is the patient or caregiver able to read and understand education materials at a high school level or above? Yes    ? Patient's primary language is  English     ? Is the patient high risk? Yes, patient is taking oral chemotherapy. Appropriateness of therapy has been assessed.     ? Does the patient require a Care Management Plan? No     ? Does the patient require physician intervention or other additional services (i.e. nutrition, smoking cessation, social work)? No      Clydell Hakim  [facility] Specialty Pharmacist

## 2019-07-17 NOTE — Unmapped (Signed)
[facility] Specialty Medication Onboarding    Specialty Medication: ENTECAVIR  Prior Authorization: Not Required   Financial Assistance: No - copay card or gant not available   Final Copay/Day Supply: $100.60 / 30 DAYS     Insurance Restrictions: Yes - max 1 month supply     Notes to Pharmacist:     The triage team has completed the benefits investigation and has determined that the patient is able to fill this medication at [facility]. Please contact the patient to complete the onboarding or follow up with the prescribing physician as needed.

## 2019-07-17 NOTE — Unmapped (Signed)
Contacted pt per Katrina Gould; pt reports she is jittery, nauseated and only slept 30 minutes last night. Has taken compazine and eaten a few crackers so far this morning, no emesis. States entecavir is being mailed to her. NN will update team for possible changes to medication regime. NN confirmed with pt that [facility] co-pay assistance program paperwork has been faxed 06/18/2019, advised pt to contact us if further assistance is required. Pt verbalized understanding and expressed appreciation for the call.

## 2019-07-18 NOTE — Unmapped (Signed)
Followed up with Katrina Gould after she started RCD on Wednesday. She is taking 12 mg dex daily. She has had trouble sleeping but otherwise is feeling okay. Some L lower extremity edema that improves with elevation. Instructed her to take the steroids as early as possible in the morning and she can take an ativan 0.5-1 mg at night to help sleep if needed. She will continue on dexamethasone 12 mg daily until Tuesday. No obvious dose-limiting toxicities reported. She sounds well on the phone. She is not sure if she has received the entecavir but later in the phone call reports she did receive something, so I think she has started the entecavir. She will return next week for lab check.     Starr Sinclair, MD  PGY-6  Hematology/Oncology Fellow

## 2019-07-21 NOTE — Unmapped (Signed)
Hi,     Katrina Gould contacted the Communication Center regarding the following:    - States she is returning call from A.Elinoff. Pt states she does have the entecavir. States she does also have a question but they are about possible side effects. The patient states since last Friday 07/18/19 at the start of her taking the medication she has been waking up drenched with sweat.     Please contact Katrina Gould at [phone] .    Thanks in advance,    Drema Balzarine  [facility]   [phone]

## 2019-07-21 NOTE — Unmapped (Signed)
R/c call to [name] who confirms she has one dose of dexamethasone remaining for tomorrow. Advised pt per Dr. Pollyann Savoy to please take her temperature tonight before bed to monitor for fever; pt verbalized understanding. Pt states she believes her sx are from the medication as she says she feels swimmyheaded and is not driving while taking this med. NN advised that sx may resolve with discontinuation of dexamethasone after last dose tomorrow morning, but that she should monitor beyond and if sx continue, to please let us know. Emotional support provided. Pt verbalized understanding and expressed appreciation for the call.

## 2019-07-21 NOTE — Unmapped (Signed)
Contacted pt per Dr. Pollyann Savoy and l/m advising check-in on status of entecavir receipt. Advised pt to call Nurse Triage and let us know if she has NOT received medication or if she has any other questions or concerns. Emotional support provided.

## 2019-07-23 ENCOUNTER — Ambulatory Visit: Admit: 2019-07-23 | Discharge: 2019-07-24 | Payer: MEDICARE

## 2019-07-23 DIAGNOSIS — C9110 Chronic lymphocytic leukemia of B-cell type not having achieved remission: Principal | ICD-10-CM

## 2019-07-23 LAB — NUCLEATED RED BLOOD CELLS: Lab: 0

## 2019-07-23 LAB — CBC W/ AUTO DIFF
BASOPHILS ABSOLUTE COUNT: 0 10*9/L (ref 0.0–0.1)
BASOPHILS RELATIVE PERCENT: 0.4 %
EOSINOPHILS ABSOLUTE COUNT: 0 10*9/L (ref 0.0–0.7)
EOSINOPHILS RELATIVE PERCENT: 0 %
HEMATOCRIT: 35.8 % (ref 35.0–44.0)
HEMOGLOBIN: 12 g/dL (ref 12.0–15.5)
LYMPHOCYTES ABSOLUTE COUNT: 1.1 10*9/L (ref 0.7–4.0)
LYMPHOCYTES RELATIVE PERCENT: 13.5 %
MEAN CORPUSCULAR HEMOGLOBIN CONC: 33.6 g/dL (ref 30.0–36.0)
MEAN CORPUSCULAR HEMOGLOBIN: 26.6 pg (ref 26.0–34.0)
MEAN CORPUSCULAR VOLUME: 79.2 fL — ABNORMAL LOW (ref 82.0–98.0)
MEAN PLATELET VOLUME: 7.7 fL (ref 7.0–10.0)
MONOCYTES ABSOLUTE COUNT: 0.5 10*9/L (ref 0.1–1.0)
MONOCYTES RELATIVE PERCENT: 6.4 %
NEUTROPHILS ABSOLUTE COUNT: 6.4 10*9/L (ref 1.7–7.7)
NEUTROPHILS RELATIVE PERCENT: 79.7 %
NUCLEATED RED BLOOD CELLS: 0 /100{WBCs} (ref <=4)
PLATELET COUNT: 138 10*9/L — ABNORMAL LOW (ref 150–450)
RED BLOOD CELL COUNT: 4.52 10*12/L (ref 3.90–5.03)
WBC ADJUSTED: 8 10*9/L (ref 3.5–10.5)

## 2019-07-23 NOTE — Unmapped (Signed)
Labs reviewed, no need for tretment. Pt discharged without concerns.

## 2019-07-23 NOTE — Unmapped (Signed)
Infusion on 07/23/2019   Component Date Value Ref Range Status   ??? WBC 07/23/2019 8.0  3.5 - 10.5 10*9/L Final   ??? RBC 07/23/2019 4.52  3.90 - 5.03 10*12/L Final   ??? HGB 07/23/2019 12.0  12.0 - 15.5 g/dL Final   ??? HCT 29/56/2130 35.8  35.0 - 44.0 % Final   ??? MCV 07/23/2019 79.2* 82.0 - 98.0 fL Final   ??? MCH 07/23/2019 26.6  26.0 - 34.0 pg Final   ??? MCHC 07/23/2019 33.6  30.0 - 36.0 g/dL Final   ??? RDW 86/57/8469 16.5* 12.0 - 15.0 % Final   ??? MPV 07/23/2019 7.7  7.0 - 10.0 fL Final   ??? Platelet 07/23/2019 138* 150 - 450 10*9/L Final   ??? nRBC 07/23/2019 0  <=4 /100 WBCs Final   ??? Neutrophils % 07/23/2019 79.7  % Final   ??? Lymphocytes % 07/23/2019 13.5  % Final   ??? Monocytes % 07/23/2019 6.4  % Final   ??? Eosinophils % 07/23/2019 0.0  % Final   ??? Basophils % 07/23/2019 0.4  % Final   ??? Absolute Neutrophils 07/23/2019 6.4  1.7 - 7.7 10*9/L Final   ??? Absolute Lymphocytes 07/23/2019 1.1  0.7 - 4.0 10*9/L Final   ??? Absolute Monocytes 07/23/2019 0.5  0.1 - 1.0 10*9/L Final   ??? Absolute Eosinophils 07/23/2019 0.0  0.0 - 0.7 10*9/L Final   ??? Absolute Basophils 07/23/2019 0.0  0.0 - 0.1 10*9/L Final

## 2019-07-30 ENCOUNTER — Ambulatory Visit: Admit: 2019-07-30 | Discharge: 2019-07-31 | Payer: MEDICARE

## 2019-07-30 DIAGNOSIS — C9110 Chronic lymphocytic leukemia of B-cell type not having achieved remission: Principal | ICD-10-CM

## 2019-07-30 LAB — CBC W/ AUTO DIFF
BASOPHILS ABSOLUTE COUNT: 0 10*9/L (ref 0.0–0.1)
BASOPHILS RELATIVE PERCENT: 1.7 %
EOSINOPHILS ABSOLUTE COUNT: 0 10*9/L (ref 0.0–0.7)
EOSINOPHILS RELATIVE PERCENT: 2.5 %
LYMPHOCYTES ABSOLUTE COUNT: 0.6 10*9/L — ABNORMAL LOW (ref 0.7–4.0)
LYMPHOCYTES RELATIVE PERCENT: 43.7 %
MEAN CORPUSCULAR HEMOGLOBIN CONC: 33.7 g/dL (ref 30.0–36.0)
MEAN CORPUSCULAR HEMOGLOBIN: 26.7 pg (ref 26.0–34.0)
MEAN CORPUSCULAR VOLUME: 79.3 fL — ABNORMAL LOW (ref 82.0–98.0)
MEAN PLATELET VOLUME: 9.1 fL (ref 7.0–10.0)
MONOCYTES ABSOLUTE COUNT: 0.3 10*9/L (ref 0.1–1.0)
MONOCYTES RELATIVE PERCENT: 19 %
NEUTROPHILS ABSOLUTE COUNT: 0.4 10*9/L — CL (ref 1.7–7.7)
NEUTROPHILS RELATIVE PERCENT: 33.1 %
RED BLOOD CELL COUNT: 4.09 10*12/L (ref 3.90–5.03)
RED CELL DISTRIBUTION WIDTH: 17 % — ABNORMAL HIGH (ref 12.0–15.0)
WBC ADJUSTED: 1.3 10*9/L — CL (ref 3.5–10.5)

## 2019-07-30 LAB — RED BLOOD CELL COUNT: Lab: 4.09

## 2019-07-30 NOTE — Unmapped (Signed)
Receive patient for possible transfusion, port accessed, labs drawn and sent for analysis.    Platelets 34, notified Becky Sawchak. [facility] blood bank does not have HLA matched platelets and next appointment 4/7. Per Becky patient to be scheduled Friday 4/2 for follow up labs, request sent for appointment in [place].    Port deaccessed, patient discharged in no acute distress.

## 2019-07-30 NOTE — Unmapped (Signed)
Infusion on 07/30/2019   Component Date Value Ref Range Status   ??? WBC 07/30/2019 1.3* 3.5 - 10.5 10*9/L Final   ??? RBC 07/30/2019 4.09  3.90 - 5.03 10*12/L Final   ??? HGB 07/30/2019 10.9* 12.0 - 15.5 g/dL Final   ??? HCT 09/81/1914 32.4* 35.0 - 44.0 % Final   ??? MCV 07/30/2019 79.3* 82.0 - 98.0 fL Final   ??? MCH 07/30/2019 26.7  26.0 - 34.0 pg Final   ??? MCHC 07/30/2019 33.7  30.0 - 36.0 g/dL Final   ??? RDW 78/29/5621 17.0* 12.0 - 15.0 % Final   ??? MPV 07/30/2019 9.1  7.0 - 10.0 fL Final   ??? Platelet 07/30/2019 34* 150 - 450 10*9/L Final   ??? nRBC 07/30/2019 0  <=4 /100 WBCs Final   ??? Neutrophils % 07/30/2019 33.1  % Final   ??? Lymphocytes % 07/30/2019 43.7  % Final   ??? Monocytes % 07/30/2019 19.0  % Final   ??? Eosinophils % 07/30/2019 2.5  % Final   ??? Basophils % 07/30/2019 1.7  % Final   ??? Absolute Neutrophils 07/30/2019 0.4* 1.7 - 7.7 10*9/L Final   ??? Absolute Lymphocytes 07/30/2019 0.6* 0.7 - 4.0 10*9/L Final   ??? Absolute Monocytes 07/30/2019 0.3  0.1 - 1.0 10*9/L Final   ??? Absolute Eosinophils 07/30/2019 0.0  0.0 - 0.7 10*9/L Final   ??? Absolute Basophils 07/30/2019 0.0  0.0 - 0.1 10*9/L Final     '

## 2019-08-01 ENCOUNTER — Other Ambulatory Visit: Admit: 2019-08-01 | Discharge: 2019-08-02 | Payer: MEDICARE

## 2019-08-01 ENCOUNTER — Ambulatory Visit: Admit: 2019-08-01 | Discharge: 2019-08-02 | Payer: MEDICARE

## 2019-08-01 DIAGNOSIS — C9110 Chronic lymphocytic leukemia of B-cell type not having achieved remission: Principal | ICD-10-CM

## 2019-08-01 LAB — CBC W/ AUTO DIFF
EOSINOPHILS RELATIVE PERCENT: 1.8 %
HEMATOCRIT: 38.3 % (ref 36.0–46.0)
HEMOGLOBIN: 11.9 g/dL — ABNORMAL LOW (ref 12.0–16.0)
LARGE UNSTAINED CELLS: 3 % (ref 0–4)
LYMPHOCYTES ABSOLUTE COUNT: 0.6 10*9/L — ABNORMAL LOW (ref 1.5–5.0)
LYMPHOCYTES RELATIVE PERCENT: 33 %
MEAN CORPUSCULAR HEMOGLOBIN CONC: 31.2 g/dL (ref 31.0–37.0)
MEAN CORPUSCULAR HEMOGLOBIN: 26.4 pg (ref 26.0–34.0)
MEAN CORPUSCULAR VOLUME: 84.7 fL (ref 80.0–100.0)
MEAN PLATELET VOLUME: 9.6 fL (ref 7.0–10.0)
MONOCYTES ABSOLUTE COUNT: 0.2 10*9/L (ref 0.2–0.8)
MONOCYTES RELATIVE PERCENT: 8.5 %
NEUTROPHILS ABSOLUTE COUNT: 1 10*9/L — ABNORMAL LOW (ref 2.0–7.5)
NEUTROPHILS RELATIVE PERCENT: 52.7 %
PLATELET COUNT: 44 10*9/L — ABNORMAL LOW (ref 150–440)
RED BLOOD CELL COUNT: 4.52 10*12/L (ref 4.00–5.20)
RED CELL DISTRIBUTION WIDTH: 16.9 % — ABNORMAL HIGH (ref 12.0–15.0)
WBC ADJUSTED: 1.9 10*9/L — ABNORMAL LOW (ref 4.5–11.0)

## 2019-08-01 LAB — SMEAR REVIEW

## 2019-08-01 LAB — BASOPHILS ABSOLUTE COUNT: Basophils:NCnc:Pt:Bld:Qn:Automated count: 0

## 2019-08-01 NOTE — Unmapped (Signed)
Lab on 08/01/2019   Component Date Value Ref Range Status   ??? WBC 08/01/2019 1.9* 4.5 - 11.0 10*9/L Final   ??? RBC 08/01/2019 4.52  4.00 - 5.20 10*12/L Final   ??? HGB 08/01/2019 11.9* 12.0 - 16.0 g/dL Final   ??? HCT 16/01/9603 38.3  36.0 - 46.0 % Final   ??? MCV 08/01/2019 84.7  80.0 - 100.0 fL Final   ??? MCH 08/01/2019 26.4  26.0 - 34.0 pg Final   ??? MCHC 08/01/2019 31.2  31.0 - 37.0 g/dL Final   ??? RDW 54/12/8117 16.9* 12.0 - 15.0 % Final   ??? MPV 08/01/2019 9.6  7.0 - 10.0 fL Final   ??? Platelet 08/01/2019 44* 150 - 440 10*9/L Final   ??? Variable HGB Concentration 08/01/2019 Slight* Not Present Final   ??? Neutrophils % 08/01/2019 52.7  % Final   ??? Lymphocytes % 08/01/2019 33.0  % Final   ??? Monocytes % 08/01/2019 8.5  % Final   ??? Eosinophils % 08/01/2019 1.8  % Final   ??? Basophils % 08/01/2019 0.7  % Final   ??? Absolute Neutrophils 08/01/2019 1.0* 2.0 - 7.5 10*9/L Final   ??? Absolute Lymphocytes 08/01/2019 0.6* 1.5 - 5.0 10*9/L Final   ??? Absolute Monocytes 08/01/2019 0.2  0.2 - 0.8 10*9/L Final   ??? Absolute Eosinophils 08/01/2019 0.0  0.0 - 0.4 10*9/L Final   ??? Absolute Basophils 08/01/2019 0.0  0.0 - 0.1 10*9/L Final   ??? Large Unstained Cells 08/01/2019 3  0 - 4 % Final   ??? Microcytosis 08/01/2019 Slight* Not Present Final   ??? Anisocytosis 08/01/2019 Slight* Not Present Final   ??? Hypochromasia 08/01/2019 Marked* Not Present Final

## 2019-08-01 NOTE — Unmapped (Signed)
Discharged from infusion area ambulatory with no transfusions needed today. AVs given to pt.

## 2019-08-01 NOTE — Unmapped (Signed)
Port accessed.  Labs collected & sent for analysis.  To next appt.  Care provided by C. Burks RN.

## 2019-08-05 NOTE — Unmapped (Addendum)
[facility] Visit    Patient Name: Katrina Gould  Patient Age: 79 y.o.  Encounter Date: 08/06/2019      Primary Care Provider:  Lauro Regulus, MD    Referring Physician:  Lauro Regulus, MD    REASON FOR VISIT:   79 y.o. female here to follow up for her CLL.    ASSESSMENT/PLAN     Katrina Gould is a 79 y.o. female who presents for follow of her CLL/SLL, Rai stage 4. Disease markers: 46,XX,der(4)t(4;4)(p14;q21). IGHV unmutated (0%, 3-33*01). B53m 5.05. No TP53 mutation. High-risk by CLL-IPI (6 points).    Platelet count downtrended despite the eltrombopag. BM bx 03/26/19 most consistent with ITP and CLL. After discussion with patient/son, stopped eltrombopag and started obinutuzumab 04/2019. Patient has now received 5 doses without improvement in platelets.     Given refractory nature of CLL-related ITP, will plan to start RCD today. In a small trial of RCD conducted in 8 CLL patients with steroid refractory AIHA, all 8 patients achieved remission Chales Abrahams et al, Leukemia 2002). While the patient has not yet been treated with a BTK inhibitor for CLL-directed treatment, given that the patient's thrombocytopenia is her major problem with a controlled lymphocyte count and very little marrow involvement by her CLL, we recommend RCD rather than BTK inhibitor given its effectiveness specifically for CLL-related autoimmune cytopenias. If no response to RCD, can trial BTK inhibitor.    RCD regimen is as follows and is given q4 weeks Chales Abrahams et al, Leukemia 2002):  -- rituximab 375 mg/m2 on D1  -- cyclophosphamide 750 mg/m2 on D1  -- dexamethasone 12 mg on D1-7    The risks and benefits of treatment were discussed at last visit and patient consented to treatment.     Patient is now D21 C1 RCD. She is tolerating treatment fairly well. Does have bilateral lower extremity edema, mild hypertension and night sweats, likely attributable to steroids. Will monitor night sweats and consider CT scans if do not improve with treatment. She has been taking her temperature and denies fevers but will monitor closely for infection as well. Patient also believes she may have been taking dexamethasone continuously for past 3 weeks so we will provide her with a more clear calendar at start of her next cycle and have instructed her to stop steroids today if she is, in fact, still taking them. She will check her pill organizer when she gets home. Instructed her if she stops today to call us with any symptoms of dizziness or lightheadedness, so we can start a taper. Her platelets initially improved exceptionally well to 138K then dropped along with her WBC on D14. Her platelets are now slowly improving to 48K today. ANC dropped transiently to 0.4, now recovered to 1.5. Will not dose reduce chemotherapy at this time given improvement in counts but will continue to monitor.  Will plan to proceed with C2 next week if platelets > 50K. We will plan to stop RCD once she has achieved best platelet response and we will continually re-assess.    RTC in 1 week for C2 RCD. Weekly blood counts. RTC in 5 weeks for consideration of C3 RCD.     Plans and Recommendations:    1. CLL with CLL-related ITP  - C1D21 RCD today  - plan for C2D1 RCD next week if counts improve  - plan for cycles q4 weeks until best platelet response, plan for no more than 6 cycles, hopefully less  -  low threshold to reduce dexamethasone dose if not tolerating  - s/p allopurinol w/ C1, discontinue today  - compazine, ativan prn for nausea  - weekly labs/transfusion support as needed  - will see CPP before C2 to provide clear schedule for steroids  - if still taking dexamethasone, instructed to stop today and call us if any dizziness. She should be out of dex so unclear if she is really still taking it  - RTC 1 week for cycle 2, in 5 weeks for consideration of C3    2. 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 Platelet transfusion reaction: Patient has had febrile non-hemolytic transfusion reaction x 2. Also had chest pain during reaction possibly related to underlying CAD.  -- infuse platelets more slowly per Transfusion Medicine  -- follows with Cardiology  -- hold ASA while platelets < 50K  -- low threshold to reduce dexamethasone dose  -- monitor sxs    4. 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 Given risk of Hep B reactivation with RTX, will start entecavir.   -- continue entecavir 0.5 mg daily. Confirmed compliance with medication.   - monitor HBV DNA PCR monthly  - follow LFTs closely  -- Continue x 1 year s/p RTX    5. Transaminitis: AST 70, ALT 78 today. Alk phos 304. Has been present  since 06/24/19. And has mildly worsened since that time. Bilirubin normal.   --Will obtain abdominal ultrasound  --hold statin  --will recheck HBV DNA PCR next week  --if not improved next week, will consider delaying next cycle of RCD until LFTs improve    6. Chest pain: Has had past episodes of chest pain, particularly with platelet transfusions. Recent TTE with EF > 55%. Cardiac cath not performed 2/2 thrombocytopenia but stress test recommended when platelets improve.   -- Cardiology follow up.     7. DOE and occasional rapid heart rate  - stable and chronic    8. Arthritis  - continues to use topical CBD oil and this has helped  - May continue prn tylenol but should avoid NSAIDs  - knows to avoid oral CBD oil    9. Thrush: related to steroids  - nystatin swish and swallow  ??  10. Health Maintenance:   - rec annual flu vaccine  - recommend COVID-19 vaccine, but will need platelets consistently 50K or higher. We will defer until platelets more stable.     Patient seen and discussed with Dr. Malen Gauze.     RTC next week.     Starr Sinclair, MD  PGY-6  Hematology/Oncology Fellow    INTERVAL HISTORY     Patient accompanied by her son today. Patient is s/p C1 RCD started on 07/16/19. She reports feeling generally well just quite fatigued. No nausea or vomiting. Denies pain. Endorses continued night sweats. Mild LE edema bilaterally. Did have some difficulty sleeping with first few days of steroids. Is not sure if she is still taking dexamethasone or not (should have stopped 2 weeks ago after 1 week course). Denies fevers, chills, lumps or bumps. No bleeding or bruising. Had diarrhea, which has now resolved. Overall, she is pleased her platelets are improving and she is able to do maintain a good quality of life at home.     HEMATOLOGICAL / ONCOLOGICAL HISTORY:  Oncology History Overview Note   CLL    First seen in oncology clinic locally 03/31/16    Had a left axillary biopsy 03/15/16 that confirmed CLL/SLL: Flow showed monoclonal B-cell population with CD5, CD23, kappa light chain. IHC showed positive CD20 (diffuse), negative for cyclin D1.    No constitutional symptoms except some sweats    Labs: Hb 12.0, Hct 34.8, plt 147. WBC was 10.6 with ANC 2.5. ALC was 7.5K.    CLL FISH studies: normal panel. No cells with 11q, tri 12, 17p or 13q, and the 11;14 translocation also not detected.    Karyotype: 46,XX,der(4)t(4;4)(p14;q21)[3]/45,XX,dic(6;17)(q12;p11.2) (no loss of TP53 on FISH)    Molecular: No TP53 mutation    CLL-IPI: As per Lancet Oncol Vol 15 October 2014  Includes (1) TP53 status (no abnormalities vs. Del 17p and/or TP53 mutation - 4 points)=0, (2) IGHV mutation status (mut vs unmut - 2 points)=2 (3) serum B2-microglobulin (</=3.5 vs >3.5 - 2 points)=2 (4) clinical stage (Rai 0 vs Rai I-IV - 1 point)=1 and (5) age (</=65 vs >65 - 1 point)=1    Total: 6 points    Low risk (0-1 points)= 93.2% OS at 5 years  Int risk (2-3 points)= 79.3%  High risk (4-6 points)= 63.3%    Very high risk (7-10 points)= 23.3%     Received rituximab weekly x4 from 09/14/17 to 10/05/17  - For ITP 2/t CLL vs. CLL  - No response    Started ven/obi regimen on 10/23/17  Baseline counts:  WBC 45.9  ALC 2.6  Hb 12.5  Plt 10    03/22/18: BM bx without overt CLL, MRD testing at [facility] revealed CD5-positive, kappa light chain-restricted B-cell  population is detected. MRD = 0.32%.    05/15/18: Repeated BM bx given persistent low counts - ~1% CLL in otherwise normal appearing marrow. Myeloid mutation panel negative. Karyotype: 45,XX,dic(6;17)(q12;p11.2)[2]/46,XX[18].       CLL (chronic lymphocytic leukemia) (CMS-HCC)   03/15/2016 Initial Diagnosis    CLL (chronic lymphocytic leukemia) (RAF-HCC)     09/14/2017 - 10/05/2017 Chemotherapy    Rituximab weekly x4  No response     09/14/2017 - 10/05/2017 Chemotherapy    Rituximab x 4     10/23/2017 -  Chemotherapy    Began ven/obi     03/19/2018 Adverse Reaction    Obinutuzumab held given persistent thrombocytopenia. Marrow performed on 03/22/18, patient with no overt CLL (0.32% by MRD flow testing from [facility]). Do not plan to give 6th cycle of obi (unless counts recover in a reasonable timeframe).     03/22/2018 Biopsy    Bone marrow, right iliac, aspiration and biopsy  -  Normocellular bone marrow (30%) with erythroid-predominant trilineage hematopoiesis   -  Suspicious for low level (<5%) residual involvement by chronic lymphocytic leukemia in a background of reactive lymphoid aggregates  -  Routine cytogenetic results reveal a normal karyotype; FISH (CLL panel) results are normal in a limited analysis; see details below (case [id number]).  -  Flow cytometric CLL MRD results reveal a CD5-positive, kappa light chain-restricted B-cell population is detected; MRD = 0.32%; see details in the Referral Testing Report in the Media Tab.     04/09/2018 Adverse Reaction    ANC down to 0.2. Held venetoclax (was previously at 200 mg daily).     05/15/2018 Biopsy    Bone marrow, right iliac, aspiration and biopsy  -  Mildly hypercellular bone marrow (40%) with trilineage hematopoiesis and persistent low level (less than 1%  of cells) involvement by chronic lymphocytic leukemia by flow cytometric analysis  -  Numerous reactive lymphoid aggregates  -  No significant morphologic dyspoiesis or increase in blasts  -  Routine cytogenetic results reveal an abnormal karyotype; FISH (CLL panel) results are normal; see details below (case [id number]).  -  Myeloid mutation panel results reveal no mutations were identified in the 34 genes analyzed; see details below (case [id number]).     03/26/2019 Biopsy    Bone marrow, right iliac, aspiration and biopsy  -  Slightly hypercellular bone marrow (40%) with persistent low-level involvement by patient's known chronic lymphocytic leukemia, representing approximately 5% of marrow cells by PAX5 immunohistochemical stain.  -  Increased megakaryocytes identified on bone marrow aspirate smears (see Comment)  -  No significant morphologic dyspoiesis or increase in blasts.  -  Routine cytogenetic analysis is pending.  -  Myeloid mutation panel is pending.    Although not a specific finding, increased megakaryocytes can be seen in the setting of immune-mediated thrombocytopenia (ITP).     B-cell chronic lymphocytic leukemia (CMS-HCC)   09/05/2017 - 10/11/2017 Chemotherapy    Chemotherapy Treatment    Treatment Goal Control   Line of Treatment [No plan line of treatment]   Plan Name OP RITUXIMAB    Start Date 09/14/2017   End Date 10/05/2017   Provider Pernell Dupre, MD   Chemotherapy riTUXimab (RITUXAN) 712.5 mg in sodium chloride (NS) 0.9 % 500 mL IVPB, 375 mg/m2 = 712.5 mg, Intravenous, Once, 1 of 1 cycle  Administration: 712.5 mg (09/14/2017)  riTUXimab (RITUXAN) 712.5 mg in sodium chloride (NS) 0.9 % 250 mL rapid infusion, 375 mg/m2 = 712.5 mg, Intravenous, Once, 1 of 1 cycle  Administration: 712.5 mg (09/21/2017), 712.5 mg (09/28/2017), 712.5 mg (10/05/2017)        10/16/2017 Initial Diagnosis    Chronic lymphocytic leukemia, Rai stage IV (CMS-HCC)     10/23/2017 - 03/18/2018 Chemotherapy    OP OBINUTUZUMAB AND VENETOCLAX  chlorambucil 0.5 mg/kg PO on days 1, 15, obinutuzumab 100 mg IV on day 1, obinutuzumab 900 mg IV on day 2, obinutuzumab 1,000 mg IV on days 8,15 on Cycle 1, then obinutuzumab 1,000 mg IV on day 1 for sequent cycles, every 28 days.     04/07/2019 - 06/30/2019 Chemotherapy    OP CLL OBINUTUZUMAB  chlorambucil 0.5 mg/kg PO on days 1, 15, obinutuzumab 100 mg IV on day 1, obinutuzumab 900 mg IV on day 2, obinutuzumab 1,000 mg IV on days 8,15 on Cycle 1, then obinutuzumab 1,000 mg IV on day 1 for sequent cycles, every 28 days.     07/16/2019 -  Chemotherapy    OP RCD CLL ITP  riTUXimab IV 375 mg/m2 on day 1, cyclophosphamide 750 mg/m2 IV on day 1, vinCRIStine 1.4 mg/m2 IV on day 1, predniSONE 100 mg PO on days 1-5, every 21 days.              OTHER PAST MEDICAL HISTORY:     Patient Active Problem List   Diagnosis   ??? CLL (chronic lymphocytic leukemia) (CMS-HCC)   ??? Chronic migraine without aura   ??? 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 Left bundle branch block (LBBB)   ??? Hypercholesterolemia   ??? Chest pain, unspecified   ??? GERD (gastroesophageal reflux disease)   ??? Osteoporosis   ??? Needs flu shot   ??? BRBPR (bright red blood per rectum)   ??? Other autoimmune hemolytic anemia   ???  B-cell chronic lymphocytic leukemia (CMS-HCC)   ??? Aortic atherosclerosis (CMS-HCC)   ??? Diverticulitis of colon   ??? Epigastric pain   ??? Gastritis, Helicobacter pylori   ??? H/O adenomatous polyp of colon   ??? Healthcare maintenance   ??? Lymphadenopathy   ??? Migraine without aura and without status migrainosus, not intractable   ??? Syncope and collapse   ??? White matter disease   ??? Vasovagal syncope   ??? Chemotherapy-induced neutropenia (CMS-HCC)   ??? Coccyx pain   ??? Chemotherapy induced diarrhea   ??? Chronic right shoulder pain   ??? Chemotherapy-induced thrombocytopenia   ??? Thrombocytopenia (CMS-HCC)   ??? Dyspnea on exertion   ??? Idiopathic thrombocytopenic purpura (ITP) (CMS-HCC)   ??? Oral thrush   ??? Febrile nonhemolytic transfusion reaction   ??? Transfusion reaction   ??? Exertional chest pain       ALLERGIES:     Baclofen    MEDICATIONS:       Outpatient Encounter Medications as of 08/06/2019   Medication Sig Dispense Refill   ??? atorvastatin (LIPITOR) 40 MG tablet Take 1 tablet (40 mg total) by mouth daily. 30 tablet 3   ??? dexAMETHasone (DECADRON) 4 MG tablet Take 12 mg (3 tablets) by mouth in the morning once daily on Days 2-7 of each cycle 18 tablet 5   ??? entecavir (BARACLUDE) 0.5 MG tablet Take 1 tablet (0.5 mg total) by mouth daily. 30 tablet 11   ??? fluticasone propion-salmeterol (ADVAIR) 100-50 mcg/dose diskus Inhale 2 puffs daily.      ??? lidocaine (LIDODERM) 5 % patch Place 1 patch on the skin daily.     ??? lidocaine-me.sal-menthol-camph (CBD-KINGS WITH LIDOCAINE) 4-9-1-1.2 % PtMd Apply 1 Dose topically Four (4) times a day.     ??? loperamide (IMODIUM A-D) 2 mg tablet Take 2 mg by mouth Three (3) times a day as needed for diarrhea.     ??? LORazepam (ATIVAN) 0.5 MG tablet Take 1 tablet (0.5 mg total) by mouth nightly as needed for anxiety. 30 tablet 1   ??? metoprolol succinate (TOPROL-XL) 25 MG 24 hr tablet Take 1 tablet (25 mg total) by mouth daily. 30 tablet 3   ??? mirtazapine (REMERON) 7.5 MG tablet Take 7.5 mg by mouth nightly.     ??? multivitamin (TAB-A-VITE/THERAGRAN) per tablet Take 1 tablet by mouth daily.      ??? mv-mn-vitC-asbNa-Glu-Lys-hc124 (AIRBORNE, ASCORBATE SODIUM,) 333-1.7 mg Chew Chew 2 tablets Three (3) times a day as needed (cold prevention).     ??? pantoprazole (PROTONIX) 40 MG tablet Take by mouth daily.      ??? prochlorperazine (COMPAZINE) 10 MG tablet Take 1 tablet (10 mg total) by mouth every six (6) hours as needed (nausea). 30 tablet 2   ??? sertraline (ZOLOFT) 50 MG tablet Take 50 mg by mouth daily.  1   ??? tiZANidine (ZANAFLEX) 2 MG tablet Take 1 tablet (2 mg total) by mouth nightly as needed. 30 tablet 0   ??? [DISCONTINUED] allopurinoL (ZYLOPRIM) 100 MG tablet Take 3 tablets (300 mg total) by mouth daily. 90 tablet 11   ??? [DISCONTINUED] LORazepam (ATIVAN) 0.5 MG tablet Take 0.5 mg by mouth nightly as needed for anxiety.      ??? [DISCONTINUED] LORazepam (ATIVAN) 1 MG tablet Take 1 tablet (1 mg total) by mouth every six (6) hours as needed (nausea/vomiting or insomnia). 30 tablet 0     Facility-Administered Encounter Medications as of 08/06/2019   Medication Dose Route Frequency Provider Last Rate  Last Admin   ??? nystatin (MYCOSTATIN) oral suspension  500,000 Units Oral 4x Daily Doneta Public, MD       ??? tbo-filgrastim [facility]) injection 480 mcg  480 mcg Subcutaneous Once Vernie Murders, [place]           Physical Exam:  Vitals:    08/06/19 0839   BP: 162/79   Pulse: 71   Resp: 18   Temp: 36.3 ??C (97.3 ??F)   SpO2: 96%       General: Well-appearing older female in no acute distress, accompanied by son  HEENT:  Clear sclera, conjunctiva, mask in place  LYMPH:  no palpable cervical, supraclavicular, axillary, or inguinal nodes  CARDAC: RRR, no M/R/Gs, trace peripheral edema  RESP: nonlabored, bilaterally CTA  GI: Soft, nontender, active bowel sounds, no hepatosplenomegaly  NEURO: A&Ox4, steady gait, no focal deficits  PSYCH: appropriate  DERM: no visible rashes, lesions.   LINE: PORT    Labs:    Results for orders placed or performed in visit on 08/06/19   Comprehensive metabolic panel   Result Value Ref Range    Sodium 139 135 - 145 mmol/L    Potassium 4.5 3.5 - 5.0 mmol/L    Chloride 104 98 - 107 mmol/L    Anion Gap 10 7 - 15 mmol/L    CO2 25.0 22.0 - 30.0 mmol/L    BUN 15 7 - 21 mg/dL    Creatinine 5.78 4.69 - 1.00 mg/dL    BUN/Creatinine Ratio 20     EGFR CKD-EPI Non-African American, Female 74 >=60 mL/min/1.41m2    EGFR CKD-EPI African American, Female 53 >=60 mL/min/1.57m2    Glucose 122 70 - 179 mg/dL    Calcium 8.8 8.5 - 62.9 mg/dL    Albumin 3.5 3.5 - 5.0 g/dL    Total Protein 5.8 (L) 6.5 - 8.3 g/dL    Total Bilirubin 0.8 0.0 - 1.2 mg/dL    AST 70 (H) 14 - 38 U/L    ALT 78 (H) <35 U/L    Alkaline Phosphatase 304 (H) 38 - 126 U/L   Lactate dehydrogenase   Result Value Ref Range LDH 614 (H) 338 - 610 U/L   Type and Screen   Result Value Ref Range    Antibody Screen NEG     ABO Grouping B NEG    CBC w/ Differential   Result Value Ref Range    WBC 2.8 (L) 4.5 - 11.0 10*9/L    RBC 4.32 4.00 - 5.20 10*12/L    HGB 11.6 (L) 12.0 - 16.0 g/dL    HCT 52.8 41.3 - 24.4 %    MCV 84.7 80.0 - 100.0 fL    MCH 27.0 26.0 - 34.0 pg    MCHC 31.8 31.0 - 37.0 g/dL    RDW 01.0 (H) 27.2 - 15.0 %    MPV 9.7 7.0 - 10.0 fL    Platelet 48 (L) 150 - 440 10*9/L    Variable HGB Concentration Slight (A) Not Present    Neutrophils % 52.6 %    Lymphocytes % 35.1 %    Monocytes % 6.1 %    Eosinophils % 2.7 %    Basophils % 0.5 %    Absolute Neutrophils 1.5 (L) 2.0 - 7.5 10*9/L    Absolute Lymphocytes 1.0 (L) 1.5 - 5.0 10*9/L    Absolute Monocytes 0.2 0.2 - 0.8 10*9/L    Absolute Eosinophils 0.1 0.0 - 0.4 10*9/L    Absolute Basophils  0.0 0.0 - 0.1 10*9/L    Large Unstained Cells 3 0 - 4 %    Microcytosis Slight (A) Not Present    Anisocytosis Slight (A) Not Present    Hypochromasia Marked (A) Not Present

## 2019-08-06 ENCOUNTER — Ambulatory Visit: Admit: 2019-08-06 | Discharge: 2019-08-06 | Payer: MEDICARE

## 2019-08-06 ENCOUNTER — Other Ambulatory Visit: Admit: 2019-08-06 | Discharge: 2019-08-06 | Payer: MEDICARE

## 2019-08-06 DIAGNOSIS — D693 Immune thrombocytopenic purpura: Principal | ICD-10-CM

## 2019-08-06 DIAGNOSIS — B370 Candidal stomatitis: Principal | ICD-10-CM

## 2019-08-06 DIAGNOSIS — C9110 Chronic lymphocytic leukemia of B-cell type not having achieved remission: Principal | ICD-10-CM

## 2019-08-06 LAB — CBC W/ AUTO DIFF
BASOPHILS ABSOLUTE COUNT: 0 10*9/L (ref 0.0–0.1)
BASOPHILS RELATIVE PERCENT: 0.5 %
HEMATOCRIT: 36.6 % (ref 36.0–46.0)
HEMOGLOBIN: 11.6 g/dL — ABNORMAL LOW (ref 12.0–16.0)
LARGE UNSTAINED CELLS: 3 % (ref 0–4)
LYMPHOCYTES ABSOLUTE COUNT: 1 10*9/L — ABNORMAL LOW (ref 1.5–5.0)
LYMPHOCYTES RELATIVE PERCENT: 35.1 %
MEAN CORPUSCULAR HEMOGLOBIN CONC: 31.8 g/dL (ref 31.0–37.0)
MEAN CORPUSCULAR HEMOGLOBIN: 27 pg (ref 26.0–34.0)
MEAN CORPUSCULAR VOLUME: 84.7 fL (ref 80.0–100.0)
MEAN PLATELET VOLUME: 9.7 fL (ref 7.0–10.0)
MONOCYTES ABSOLUTE COUNT: 0.2 10*9/L (ref 0.2–0.8)
MONOCYTES RELATIVE PERCENT: 6.1 %
NEUTROPHILS ABSOLUTE COUNT: 1.5 10*9/L — ABNORMAL LOW (ref 2.0–7.5)
NEUTROPHILS RELATIVE PERCENT: 52.6 %
RED BLOOD CELL COUNT: 4.32 10*12/L (ref 4.00–5.20)
RED CELL DISTRIBUTION WIDTH: 17.8 % — ABNORMAL HIGH (ref 12.0–15.0)
WBC ADJUSTED: 2.8 10*9/L — ABNORMAL LOW (ref 4.5–11.0)

## 2019-08-06 LAB — LACTATE DEHYDROGENASE: Lactate dehydrogenase:CCnc:Pt:Ser/Plas:Qn:Reaction: pyruvate to lactate: 614 — ABNORMAL HIGH

## 2019-08-06 LAB — COMPREHENSIVE METABOLIC PANEL
ALBUMIN: 3.5 g/dL (ref 3.5–5.0)
ALKALINE PHOSPHATASE: 304 U/L — ABNORMAL HIGH (ref 38–126)
ALT (SGPT): 78 U/L — ABNORMAL HIGH (ref <35)
ANION GAP: 10 mmol/L (ref 7–15)
AST (SGOT): 70 U/L — ABNORMAL HIGH (ref 14–38)
BILIRUBIN TOTAL: 0.8 mg/dL (ref 0.0–1.2)
BLOOD UREA NITROGEN: 15 mg/dL (ref 7–21)
CALCIUM: 8.8 mg/dL (ref 8.5–10.2)
CHLORIDE: 104 mmol/L (ref 98–107)
CO2: 25 mmol/L (ref 22.0–30.0)
CREATININE: 0.76 mg/dL (ref 0.60–1.00)
EGFR CKD-EPI AA FEMALE: 86 mL/min/{1.73_m2} (ref >=60)
EGFR CKD-EPI NON-AA FEMALE: 74 mL/min/{1.73_m2} (ref >=60)
GLUCOSE RANDOM: 122 mg/dL (ref 70–179)
POTASSIUM: 4.5 mmol/L (ref 3.5–5.0)
SODIUM: 139 mmol/L (ref 135–145)

## 2019-08-06 LAB — PROTEIN TOTAL: Protein:MCnc:Pt:Ser/Plas:Qn:: 5.8 — ABNORMAL LOW

## 2019-08-06 LAB — RED BLOOD CELL COUNT: Lab: 4.32

## 2019-08-06 MED ORDER — LORAZEPAM 0.5 MG TABLET
ORAL_TABLET | Freq: Every evening | ORAL | 1 refills | 30 days | Status: CP | PRN
Start: 2019-08-06 — End: ?

## 2019-08-06 NOTE — Unmapped (Signed)
08/06/19 confirmed via vm 4/14, 4/21, 4/28, 5/5  @ 11:30am for Pat.

## 2019-08-06 NOTE — Unmapped (Signed)
Contacted [name] Benfield per [employer] to advise him to call Financial Counseling to resolve questions about outstanding pt balance vs possible LLS grant. NN will be available for further assistance if needed. Emotional support provided. Mr. Frazier Butt and Ms. Ehrmann verbalized understanding and expressed appreciation for the call.

## 2019-08-06 NOTE — Unmapped (Addendum)
It was great to see you today.    Your platelets are 48,000 today. We are pleased with your response to treatment.    We will see you back in 1 week for cycle 2 of treatment.    I will see you back in 5 weeks to determine whether to proceed with cycle 3. You will get labs checked weekly in between cycles.    Please STOP taking allopurinol today. MAKE SURE you are only taking dexamethasone for 6 days after each cycle of treatment. We will try to get you a calendar for this.    I will see you back in 5 weeks.     Labwork:  Lab on 08/06/2019   Component Date Value Ref Range Status   ??? Antibody Screen 08/06/2019 NEG   Final   ??? ABO Grouping 08/06/2019 B NEG   Final   ??? Sodium 08/06/2019 139  135 - 145 mmol/L Final   ??? Potassium 08/06/2019 4.5  3.5 - 5.0 mmol/L Final   ??? Chloride 08/06/2019 104  98 - 107 mmol/L Final   ??? Anion Gap 08/06/2019 10  7 - 15 mmol/L Final   ??? CO2 08/06/2019 25.0  22.0 - 30.0 mmol/L Final   ??? BUN 08/06/2019 15  7 - 21 mg/dL Final   ??? Creatinine 08/06/2019 0.76  0.60 - 1.00 mg/dL Final   ??? BUN/Creatinine Ratio 08/06/2019 20   Final   ??? EGFR CKD-EPI Non-African American,* 08/06/2019 74  >=60 mL/min/1.56m2 Final   ??? EGFR CKD-EPI African American, Fem* 08/06/2019 86  >=60 mL/min/1.19m2 Final   ??? Glucose 08/06/2019 122  70 - 179 mg/dL Final   ??? Calcium 16/01/9603 8.8  8.5 - 10.2 mg/dL Final   ??? Albumin 54/12/8117 3.5  3.5 - 5.0 g/dL Final   ??? Total Protein 08/06/2019 5.8* 6.5 - 8.3 g/dL Final   ??? Total Bilirubin 08/06/2019 0.8  0.0 - 1.2 mg/dL Final   ??? AST 14/78/2956 70* 14 - 38 U/L Final   ??? ALT 08/06/2019 78* <35 U/L Final   ??? Alkaline Phosphatase 08/06/2019 304* 38 - 126 U/L Final   ??? LDH 08/06/2019 614* 338 - 610 U/L Final   ??? WBC 08/06/2019 2.8* 4.5 - 11.0 10*9/L Final   ??? RBC 08/06/2019 4.32  4.00 - 5.20 10*12/L Final   ??? HGB 08/06/2019 11.6* 12.0 - 16.0 g/dL Final   ??? HCT 21/30/8657 36.6  36.0 - 46.0 % Final   ??? MCV 08/06/2019 84.7  80.0 - 100.0 fL Final   ??? MCH 08/06/2019 27.0  26.0 - 34.0 pg Final   ??? MCHC 08/06/2019 31.8  31.0 - 37.0 g/dL Final   ??? RDW 84/69/6295 17.8* 12.0 - 15.0 % Final   ??? MPV 08/06/2019 9.7  7.0 - 10.0 fL Final   ??? Platelet 08/06/2019 48* 150 - 440 10*9/L Final   ??? Variable HGB Concentration 08/06/2019 Slight* Not Present Final   ??? Neutrophils % 08/06/2019 52.6  % Final   ??? Lymphocytes % 08/06/2019 35.1  % Final   ??? Monocytes % 08/06/2019 6.1  % Final   ??? Eosinophils % 08/06/2019 2.7  % Final   ??? Basophils % 08/06/2019 0.5  % Final   ??? Absolute Neutrophils 08/06/2019 1.5* 2.0 - 7.5 10*9/L Final   ??? Absolute Lymphocytes 08/06/2019 1.0* 1.5 - 5.0 10*9/L Final   ??? Absolute Monocytes 08/06/2019 0.2  0.2 - 0.8 10*9/L Final   ??? Absolute Eosinophils 08/06/2019 0.1  0.0 - 0.4  10*9/L Final   ??? Absolute Basophils 08/06/2019 0.0  0.0 - 0.1 10*9/L Final   ??? Large Unstained Cells 08/06/2019 3  0 - 4 % Final   ??? Microcytosis 08/06/2019 Slight* Not Present Final   ??? Anisocytosis 08/06/2019 Slight* Not Present Final   ??? Hypochromasia 08/06/2019 Marked* Not Present Final       Please call us if you experience:   1. Nausea or vomiting not controlled by nausea medicines  2. Fever of 100.4 F or higher   3. Uncontrolled pain  4. Any other concerning symptom     For any cancer-related concerns, including:   Appointment information   Questions regarding your cancer diagnosis or treatment   Any new symptoms.     Please call [phone].    For EMERGENCIES ONLY, on Nights, Weekends and Holidays please call [phone] and ask for the oncologist on call.    [facility]  [address]  [place], [place] [address]  [email or web address]

## 2019-08-07 MED ORDER — NYSTATIN 100,000 UNIT/ML ORAL SUSPENSION
Freq: Four times a day (QID) | ORAL | 0 refills | 3.00000 days | Status: CP
Start: 2019-08-07 — End: ?

## 2019-08-07 NOTE — Unmapped (Signed)
[facility] Clinical Assessment & Refill Coordination Note    Ms. Lundin requested a prescription for her thrush to be [facility] [place], DOB: 1941-04-22  Phone: [phone] (home) [phone] (work)    All above HIPAA information was verified with patient.     Was a Nurse, learning disability used for this call? No    Specialty Medication(s):   Infectious Disease: entecavir     Current Outpatient Medications   Medication Sig Dispense Refill   ??? atorvastatin (LIPITOR) 40 MG tablet Take 1 tablet (40 mg total) by mouth daily. 30 tablet 3   ??? dexAMETHasone (DECADRON) 4 MG tablet Take 12 mg (3 tablets) by mouth in the morning once daily on Days 2-7 of each cycle 18 tablet 5   ??? entecavir (BARACLUDE) 0.5 MG tablet Take 1 tablet (0.5 mg total) by mouth daily. 30 tablet 11   ??? fluticasone propion-salmeterol (ADVAIR) 100-50 mcg/dose diskus Inhale 2 puffs daily.      ??? lidocaine-me.sal-menthol-camph (CBD-KINGS WITH LIDOCAINE) 4-9-1-1.2 % PtMd Apply 1 Dose topically Four (4) times a day.     ??? loperamide (IMODIUM A-D) 2 mg tablet Take 2 mg by mouth Three (3) times a day as needed for diarrhea.     ??? LORazepam (ATIVAN) 0.5 MG tablet Take 1 tablet (0.5 mg total) by mouth nightly as needed for anxiety. 30 tablet 1   ??? metoprolol succinate (TOPROL-XL) 25 MG 24 hr tablet Take 1 tablet (25 mg total) by mouth daily. 30 tablet 3   ??? mirtazapine (REMERON) 7.5 MG tablet Take 7.5 mg by mouth nightly.     ??? multivitamin (TAB-A-VITE/THERAGRAN) per tablet Take 1 tablet by mouth daily.      ??? mv-mn-vitC-asbNa-Glu-Lys-hc124 (AIRBORNE, ASCORBATE SODIUM,) 333-1.7 mg Chew Chew 2 tablets Three (3) times a day as needed (cold prevention).     ??? pantoprazole (PROTONIX) 40 MG tablet Take by mouth daily.      ??? prochlorperazine (COMPAZINE) 10 MG tablet Take 1 tablet (10 mg total) by mouth every six (6) hours as needed (nausea). 30 tablet 2   ??? sertraline (ZOLOFT) 50 MG tablet Take 50 mg by mouth daily.  1   ??? tiZANidine (ZANAFLEX) 2 MG tablet Take 1 tablet (2 mg total) by mouth nightly as needed. 30 tablet 0     No current facility-administered medications for this visit.      Facility-Administered Medications Ordered in Other Visits   Medication Dose Route Frequency Provider Last Rate Last Admin   ??? tbo-filgrastim (GRANIX) injection 480 mcg  480 mcg Subcutaneous Once Vernie Murders, AGNP            Changes to medications: [name] reports no changes at this time.    Allergies   Allergen Reactions   ??? Baclofen      made me go crazy       Changes to allergies: No    SPECIALTY MEDICATION ADHERENCE     entecavir 0.5 mg: 11 days of medicine on hand     Medication Adherence    Patient reported X missed doses in the last month: 0  Specialty Medication: entecavir 0.5 mg  Informant: patient  Confirmed plan for next specialty medication refill: delivery by pharmacy          Specialty medication(s) dose(s) confirmed: Regimen is correct and unchanged.     Are there any concerns with adherence? No    Adherence  counseling provided? Not needed    CLINICAL MANAGEMENT AND INTERVENTION      Clinical Benefit Assessment:    Do you feel the medicine is effective or helping your condition? Yes    Clinical Benefit counseling provided? Not needed    Adverse Effects Assessment:    Are you experiencing any side effects? No    Are you experiencing difficulty administering your medicine? 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 For example, brushing your teeth or getting up in the morning. 0    Have you discussed this with your provider? Not needed    Therapy Appropriateness:    Is therapy appropriate? Yes, therapy is appropriate and should be continued    DISEASE/MEDICATION-SPECIFIC INFORMATION      N/A    PATIENT SPECIFIC NEEDS     ? Does the patient have any physical, cognitive, or cultural barriers? No    ? Is the patient high risk? No     ? Does the patient require a Care Management Plan? No     ? Does the patient require physician intervention or other additional services (i.e. nutrition, smoking cessation, social work)? No      SHIPPING     Specialty Medication(s) to be Shipped:   Infectious Disease: entecavir    Other medication(s) to be shipped: none      Changes to insurance: No    Delivery Scheduled: Yes, Expected medication delivery date: 08/14/19.     Medication will be delivered via Next Day Courier to the confirmed prescription address in [place].    The patient will receive a drug information handout for each medication shipped and additional FDA Medication Guides as required.  Verified that patient has previously received a Conservation officer, historic buildings.    All of the patient's questions and concerns have been addressed.    Breck Coons [facility] Specialty Pharmacist

## 2019-08-07 NOTE — Unmapped (Signed)
Addended by: Doneta Public on: 08/07/2019 12:15 PM     Modules accepted: Orders

## 2019-08-07 NOTE — Unmapped (Signed)
Contacted Ms. Raftery per Dr. Pollyann Savoy and l/m advising that [facility]. Emotional support provided.

## 2019-08-08 NOTE — Unmapped (Signed)
Addended by: Doneta Public on: 08/08/2019 07:31 AM     Modules accepted: Orders

## 2019-08-08 NOTE — Unmapped (Signed)
Attempted to call Katrina Gould to tell her to stop taking her atorvastatin due to elevated LFTs. Have also ordered a liver ultrasound for her to have done this week and will re-check hepatitis B viral load prior to next cycle of therapy.     Will send a MyChart message and ask NN to help follow up on this.

## 2019-08-08 NOTE — Unmapped (Signed)
Contacted [name] Benfield to relay message from Dr. Pollyann Savoy. Mr. Katrina Gould will relay to his mother to stop atorvastatin (Lipitor) until further notice. NN advised rationale and plan for liver ultrasound and f/u testing for Hep B prior to next cycle. Mr. Katrina Gould confirmed understanding and will contact his mother with this information. Emotional support provided. Mr. Katrina Gould verbalized appreciation for the call.

## 2019-08-13 ENCOUNTER — Ambulatory Visit: Admit: 2019-08-13 | Discharge: 2019-08-14 | Payer: MEDICARE

## 2019-08-13 ENCOUNTER — Institutional Professional Consult (permissible substitution): Admit: 2019-08-13 | Discharge: 2019-08-14 | Payer: MEDICARE | Attending: Pharmacist | Primary: Pharmacist

## 2019-08-13 DIAGNOSIS — C9110 Chronic lymphocytic leukemia of B-cell type not having achieved remission: Principal | ICD-10-CM

## 2019-08-13 DIAGNOSIS — D693 Immune thrombocytopenic purpura: Principal | ICD-10-CM

## 2019-08-13 DIAGNOSIS — R740 Nonspecific elevation of levels of transaminase and lactic acid dehydrogenase [LDH]: Principal | ICD-10-CM

## 2019-08-13 DIAGNOSIS — R51 Headache: Principal | ICD-10-CM

## 2019-08-13 LAB — COMPREHENSIVE METABOLIC PANEL
ALKALINE PHOSPHATASE: 396 U/L — ABNORMAL HIGH (ref 38–126)
ALT (SGPT): 72 U/L — ABNORMAL HIGH (ref <35)
ANION GAP: 10 mmol/L (ref 7–15)
AST (SGOT): 74 U/L — ABNORMAL HIGH (ref 14–38)
BILIRUBIN TOTAL: 1.1 mg/dL (ref 0.0–1.2)
BLOOD UREA NITROGEN: 15 mg/dL (ref 7–21)
BUN / CREAT RATIO: 20
CALCIUM: 8.8 mg/dL (ref 8.5–10.2)
CHLORIDE: 105 mmol/L (ref 98–107)
CO2: 24 mmol/L (ref 22.0–30.0)
CREATININE: 0.75 mg/dL (ref 0.60–1.00)
EGFR CKD-EPI AA FEMALE: 87 mL/min/{1.73_m2} (ref >=60)
EGFR CKD-EPI NON-AA FEMALE: 76 mL/min/{1.73_m2} (ref >=60)
GLUCOSE RANDOM: 145 mg/dL (ref 70–179)
POTASSIUM: 4 mmol/L (ref 3.5–5.0)
PROTEIN TOTAL: 5.8 g/dL — ABNORMAL LOW (ref 6.5–8.3)
SODIUM: 139 mmol/L (ref 135–145)

## 2019-08-13 LAB — CALCIUM: Calcium:MCnc:Pt:Ser/Plas:Qn:: 8.8

## 2019-08-13 LAB — CBC W/ AUTO DIFF
BASOPHILS ABSOLUTE COUNT: 0 10*9/L (ref 0.0–0.1)
BASOPHILS RELATIVE PERCENT: 0.4 %
EOSINOPHILS ABSOLUTE COUNT: 0.1 10*9/L (ref 0.0–0.7)
EOSINOPHILS RELATIVE PERCENT: 2.7 %
HEMATOCRIT: 35.4 % (ref 35.0–44.0)
HEMOGLOBIN: 11.9 g/dL — ABNORMAL LOW (ref 12.0–15.5)
MEAN CORPUSCULAR HEMOGLOBIN CONC: 33.7 g/dL (ref 30.0–36.0)
MEAN CORPUSCULAR HEMOGLOBIN: 26.7 pg (ref 26.0–34.0)
MEAN CORPUSCULAR VOLUME: 79.3 fL — ABNORMAL LOW (ref 82.0–98.0)
MONOCYTES ABSOLUTE COUNT: 0.3 10*9/L (ref 0.1–1.0)
MONOCYTES RELATIVE PERCENT: 14.4 %
NEUTROPHILS ABSOLUTE COUNT: 1 10*9/L — ABNORMAL LOW (ref 1.7–7.7)
NEUTROPHILS RELATIVE PERCENT: 49.1 %
NUCLEATED RED BLOOD CELLS: 0 /100{WBCs} (ref <=4)
PLATELET COUNT: 34 10*9/L — ABNORMAL LOW (ref 150–450)
RED BLOOD CELL COUNT: 4.46 10*12/L (ref 3.90–5.03)
RED CELL DISTRIBUTION WIDTH: 19.4 % — ABNORMAL HIGH (ref 12.0–15.0)
WBC ADJUSTED: 2.1 10*9/L — ABNORMAL LOW (ref 3.5–10.5)

## 2019-08-13 LAB — PLATELET COUNT: Platelets:NCnc:Pt:Bld:Qn:Automated count: 34 — ABNORMAL LOW

## 2019-08-13 LAB — LACTATE DEHYDROGENASE
LACTATE DEHYDROGENASE: 642 U/L — ABNORMAL HIGH (ref 338–610)
Lactate dehydrogenase:CCnc:Pt:Ser/Plas:Qn:: 642 — ABNORMAL HIGH

## 2019-08-13 MED ORDER — BUTALBITAL-ACETAMINOPHEN-CAFFEINE 50 MG-300 MG-40 MG CAPSULE
ORAL_CAPSULE | ORAL | 0 refills | 7.00000 days | Status: CP | PRN
Start: 2019-08-13 — End: 2019-08-20

## 2019-08-13 MED ORDER — AMLODIPINE 5 MG TABLET
ORAL_TABLET | Freq: Every day | ORAL | 2 refills | 30.00000 days | Status: CP
Start: 2019-08-13 — End: 2020-08-12

## 2019-08-13 MED FILL — ENTECAVIR 0.5 MG TABLET: 30 days supply | Qty: 30 | Fill #1 | Status: AC

## 2019-08-13 MED FILL — ENTECAVIR 0.5 MG TABLET: ORAL | 30 days supply | Qty: 30 | Fill #1

## 2019-08-13 NOTE — Unmapped (Signed)
Katrina Gould is a 79 y.o. 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 I called her over the phone today to review some medication management items. Katrina Gould confirmed she has stopped taking atorvastatin as previously directed. She also reports taking entecavir daily and will receive a refill of this medication in the mail tomorrow from [facility]. Other than that, she was not certain of what medications she is currently taking and said it would be better to review her meds with her when she is at home and has them in front of her. Additionally, she reports a headache that she has had for about a week now that she thinks is stress-related. She said the headache hits and then spreads out. Currently she reports the headache pain to be an 8 out of 10. She says she has had these headaches before and has taken a muscle relaxer or previously prescribed hydrocodone (she said last prescribed in 2019) to help. She reports she is taking the tizanidine every night without relief.    Oncologic History:  Oncology History Overview Note   CLL    First seen in oncology clinic locally 03/31/16    Had a left axillary biopsy 03/15/16 that confirmed CLL/SLL: Flow showed monoclonal B-cell population with CD5, CD23, kappa light chain. IHC showed positive CD20 (diffuse), negative for cyclin D1.    No constitutional symptoms except some sweats    Labs: Hb 12.0, Hct 34.8, plt 147. WBC was 10.6 with ANC 2.5. ALC was 7.5K.    CLL FISH studies: normal panel. No cells with 11q, tri 12, 17p or 13q, and the 11;14 translocation also not detected.    Karyotype: 46,XX,der(4)t(4;4)(p14;q21)[3]/45,XX,dic(6;17)(q12;p11.2) (no loss of TP53 on FISH)    Molecular: No TP53 mutation    CLL-IPI: As per Lancet Oncol Vol 15 October 2014  Includes (1) TP53 status (no abnormalities vs. Del 17p and/or TP53 mutation - 4 points)=0, (2) IGHV mutation status (mut vs unmut - 2 points)=2 (3) serum B2-microglobulin (</=3.5 vs >3.5 - 2 points)=2 (4) clinical stage (Rai 0 vs Rai I-IV - 1 point)=1 and (5) age (</=65 vs >65 - 1 point)=1    Total: 6 points    Low risk (0-1 points)= 93.2% OS at 5 years  Int risk (2-3 points)= 79.3%  High risk (4-6 points)= 63.3%    Very high risk (7-10 points)= 23.3%     Received rituximab weekly x4 from 09/14/17 to 10/05/17  - For ITP 2/t CLL vs. CLL  - No response    Started ven/obi regimen on 10/23/17  Baseline counts:  WBC 45.9  ALC 2.6  Hb 12.5  Plt 10    03/22/18: BM bx without overt CLL, MRD testing at [facility] revealed CD5-positive, kappa light chain-restricted B-cell  population is detected. MRD = 0.32%.    05/15/18: Repeated BM bx given persistent low counts - ~1% CLL in otherwise normal appearing marrow. Myeloid mutation panel negative. Karyotype: 45,XX,dic(6;17)(q12;p11.2)[2]/46,XX[18].       CLL (chronic lymphocytic leukemia) (CMS-HCC)   03/15/2016 Initial Diagnosis    CLL (chronic lymphocytic leukemia) (RAF-HCC)     09/14/2017 - 10/05/2017 Chemotherapy    Rituximab weekly x4  No response     09/14/2017 - 10/05/2017 Chemotherapy    Rituximab x 4     10/23/2017 -  Chemotherapy    Began ven/obi     03/19/2018 Adverse Reaction    Obinutuzumab held given persistent thrombocytopenia. Marrow performed on 03/22/18, patient with no overt CLL (0.32% by MRD flow testing from [facility]). Do not plan to give 6th cycle of obi (unless counts recover in a reasonable timeframe).     03/22/2018 Biopsy    Bone marrow, right iliac, aspiration and biopsy  -  Normocellular bone marrow (30%) with erythroid-predominant trilineage hematopoiesis   -  Suspicious for low level (<5%) residual involvement by chronic lymphocytic leukemia in a background of reactive lymphoid aggregates  -  Routine cytogenetic results reveal a normal karyotype; FISH (CLL panel) results are normal in a limited analysis; see details below (case [id number]).  -  Flow cytometric CLL MRD results reveal a CD5-positive, kappa light chain-restricted B-cell population is detected; MRD = 0.32%; see details in the Referral Testing Report in the Media Tab.     04/09/2018 Adverse Reaction    ANC down to 0.2. Held venetoclax (was previously at 200 mg daily).     05/15/2018 Biopsy    Bone marrow, right iliac, aspiration and biopsy  -  Mildly hypercellular bone marrow (40%) with trilineage hematopoiesis and persistent low level (less than 1% of cells) involvement by chronic lymphocytic leukemia by flow cytometric analysis  -  Numerous reactive lymphoid aggregates  -  No significant morphologic dyspoiesis or increase in blasts  -  Routine cytogenetic results reveal an abnormal karyotype; FISH (CLL panel) results are normal; see details below (case [id number]).  -  Myeloid mutation panel results reveal no mutations were identified in the 34 genes analyzed; see details below (case [id number]).     03/26/2019 Biopsy    Bone marrow, right iliac, aspiration and biopsy  -  Slightly hypercellular bone marrow (40%) with persistent low-level involvement by patient's known chronic lymphocytic leukemia, representing approximately 5% of marrow cells by PAX5 immunohistochemical stain.  -  Increased megakaryocytes identified on bone marrow aspirate smears (see Comment)  -  No significant morphologic dyspoiesis or increase in blasts.  -  Routine cytogenetic analysis is pending.  -  Myeloid mutation panel is pending.    Although not a specific finding, increased megakaryocytes can be seen in the setting of immune-mediated thrombocytopenia (ITP).     B-cell chronic lymphocytic leukemia (CMS-HCC)   09/05/2017 - 10/11/2017 Chemotherapy    Chemotherapy Treatment    Treatment Goal Control   Line of Treatment [No plan line of treatment]   Plan Name OP RITUXIMAB    Start Date 09/14/2017   End Date 10/05/2017   Provider Pernell Dupre, MD   Chemotherapy riTUXimab (RITUXAN) 712.5 mg in sodium chloride (NS) 0.9 % 500 mL IVPB, 375 mg/m2 = 712.5 mg, Intravenous, Once, 1 of 1 cycle  Administration: 712.5 mg (09/14/2017)  riTUXimab (RITUXAN) 712.5 mg in sodium chloride (NS) 0.9 % 250 mL rapid infusion, 375 mg/m2 = 712.5 mg, Intravenous, Once, 1 of 1 cycle  Administration: 712.5 mg (09/21/2017), 712.5 mg (09/28/2017), 712.5 mg (10/05/2017)        10/16/2017 Initial Diagnosis    Chronic lymphocytic leukemia, Rai stage IV (CMS-HCC)     10/23/2017 - 03/18/2018 Chemotherapy    OP OBINUTUZUMAB AND VENETOCLAX  chlorambucil 0.5 mg/kg PO on days 1, 15, obinutuzumab 100 mg IV on day 1, obinutuzumab 900 mg IV on day  2, obinutuzumab 1,000 mg IV on days 8,15 on Cycle 1, then obinutuzumab 1,000 mg IV on day 1 for sequent cycles, every 28 days.     04/07/2019 - 06/30/2019 Chemotherapy    OP CLL OBINUTUZUMAB  chlorambucil 0.5 mg/kg PO on days 1, 15, obinutuzumab 100 mg IV on day 1, obinutuzumab 900 mg IV on day 2, obinutuzumab 1,000 mg IV on days 8,15 on Cycle 1, then obinutuzumab 1,000 mg IV on day 1 for sequent cycles, every 28 days.     07/16/2019 -  Chemotherapy    OP RCD CLL ITP  riTUXimab IV 375 mg/m2 on day 1, cyclophosphamide 750 mg/m2 IV on day 1, vinCRIStine 1.4 mg/m2 IV on day 1, predniSONE 100 mg PO on days 1-5, every 21 days.         Weight and Vitals:  Wt Readings from Last 3 Encounters:   08/13/19 87.6 kg (193 lb 2 oz)   08/06/19 88.7 kg (195 lb 8 oz)   08/01/19 89.4 kg (197 lb 1.5 oz)     Temp Readings from Last 3 Encounters:   08/13/19 37.1 ??C (98.8 ??F) (Oral)   08/06/19 36.3 ??C (97.3 ??F) (Temporal)   08/01/19 36.5 ??C (97.7 ??F) (Oral)     BP Readings from Last 3 Encounters:   08/13/19 165/77   08/06/19 162/79   08/01/19 148/67     Pulse Readings from Last 3 Encounters:   08/13/19 85   08/06/19 71   08/01/19 88       Pertinent Labs:  Infusion on 08/13/2019   Component Date Value Ref Range Status   ??? Sodium 08/13/2019 139  135 - 145 mmol/L Final   ??? Potassium 08/13/2019 4.0  3.5 - 5.0 mmol/L Final   ??? Chloride 08/13/2019 105  98 - 107 mmol/L Final   ??? Anion Gap 08/13/2019 10  7 - 15 mmol/L Final   ??? CO2 08/13/2019 24.0  22.0 - 30.0 mmol/L Final   ??? BUN 08/13/2019 15  7 - 21 mg/dL Final   ??? Creatinine 08/13/2019 0.75  0.60 - 1.00 mg/dL Final   ??? BUN/Creatinine Ratio 08/13/2019 20   Final   ??? EGFR CKD-EPI Non-African American,* 08/13/2019 76  >=60 mL/min/1.90m2 Final   ??? EGFR CKD-EPI African American, Fem* 08/13/2019 87  >=60 mL/min/1.40m2 Final   ??? Glucose 08/13/2019 145  70 - 179 mg/dL Final   ??? Calcium 28/41/3244 8.8  8.5 - 10.2 mg/dL Final   ??? Albumin 05/03/7251 3.7  3.5 - 5.0 g/dL Final   ??? Total Protein 08/13/2019 5.8* 6.5 - 8.3 g/dL Final   ??? Total Bilirubin 08/13/2019 1.1  0.0 - 1.2 mg/dL Final   ??? AST 66/44/0347 74* 14 - 38 U/L Final   ??? ALT 08/13/2019 72* <35 U/L Final   ??? Alkaline Phosphatase 08/13/2019 396* 38 - 126 U/L Final   ??? LDH 08/13/2019 642* 338 - 610 U/L Final   ??? WBC 08/13/2019 2.1* 3.5 - 10.5 10*9/L Final   ??? RBC 08/13/2019 4.46  3.90 - 5.03 10*12/L Final   ??? HGB 08/13/2019 11.9* 12.0 - 15.5 g/dL Final   ??? HCT 42/59/5638 35.4  35.0 - 44.0 % Final   ??? MCV 08/13/2019 79.3* 82.0 - 98.0 fL Final   ??? MCH 08/13/2019 26.7  26.0 - 34.0 pg Final   ??? MCHC 08/13/2019 33.7  30.0 - 36.0 g/dL Final   ??? RDW 75/64/3329 19.4* 12.0 - 15.0 % Final   ??? MPV 08/13/2019 8.4  7.0 - 10.0 fL Final   ??? Platelet 08/13/2019 34* 150 - 450 10*9/L Final   ??? nRBC 08/13/2019 0  <=4 /100 WBCs Final   ??? Neutrophils % 08/13/2019 49.1  % Final   ??? Lymphocytes % 08/13/2019 33.4  % Final   ??? Monocytes % 08/13/2019 14.4  % Final   ??? Eosinophils % 08/13/2019 2.7  % Final   ??? Basophils % 08/13/2019 0.4  % Final   ??? Absolute Neutrophils 08/13/2019 1.0* 1.7 - 7.7 10*9/L Final   ??? Absolute Lymphocytes 08/13/2019 0.7  0.7 - 4.0 10*9/L Final   ??? Absolute Monocytes 08/13/2019 0.3  0.1 - 1.0 10*9/L Final   ??? Absolute Eosinophils 08/13/2019 0.1  0.0 - 0.7 10*9/L Final   ??? Absolute Basophils 08/13/2019 0.0  0.0 - 0.1 10*9/L Final   ??? Anisocytosis 08/13/2019 Moderate* Not Present Final       Allergies:   Allergies   Allergen Reactions   ??? Baclofen      made me go crazy       Current Medications:  Current Outpatient Medications   Medication Sig Dispense Refill   ??? entecavir (BARACLUDE) 0.5 MG tablet Take 1 tablet (0.5 mg total) by mouth daily. 30 tablet 11   ??? fluticasone propion-salmeterol (ADVAIR) 100-50 mcg/dose diskus Inhale 2 puffs daily.      ??? loperamide (IMODIUM A-D) 2 mg tablet Take 2 mg by mouth Three (3) times a day as needed for diarrhea.     ??? LORazepam (ATIVAN) 0.5 MG tablet Take 1 tablet (0.5 mg total) by mouth nightly as needed for anxiety. 30 tablet 1   ??? amLODIPine (NORVASC) 5 MG tablet Take 1 tablet (5 mg total) by mouth daily. 30 tablet 2   ??? dexAMETHasone (DECADRON) 4 MG tablet Take 12 mg (3 tablets) by mouth in the morning once daily on Days 2-7 of each cycle 18 tablet 5   ??? metoprolol succinate (TOPROL-XL) 25 MG 24 hr tablet Take 1 tablet (25 mg total) by mouth daily. 30 tablet 3   ??? mirtazapine (REMERON) 7.5 MG tablet Take 7.5 mg by mouth nightly.     ??? multivitamin (TAB-A-VITE/THERAGRAN) per tablet Take 1 tablet by mouth daily.      ??? mv-mn-vitC-asbNa-Glu-Lys-hc124 (AIRBORNE, ASCORBATE SODIUM,) 333-1.7 mg Chew Chew 2 tablets Three (3) times a day as needed (cold prevention).     ??? nystatin (MYCOSTATIN) 100,000 unit/mL suspension Take 5 mL (500,000 Units total) by mouth Four (4) times a day. 60 mL 0   ??? pantoprazole (PROTONIX) 40 MG tablet Take by mouth daily.      ??? prochlorperazine (COMPAZINE) 10 MG tablet Take 1 tablet (10 mg total) by mouth every six (6) hours as needed (nausea). 30 tablet 2   ??? sertraline (ZOLOFT) 50 MG tablet Take 50 mg by mouth daily.  1   ??? tiZANidine (ZANAFLEX) 2 MG tablet Take 1 tablet (2 mg total) by mouth nightly as needed. 30 tablet 0     No current facility-administered medications for this visit.     Facility-Administered Medications Ordered in Other Visits   Medication Dose Route Frequency Provider Last Rate Last Admin   ??? CHEMO CLARIFICATION ORDER   Other Continuous PRN Malva Cogan, CPP       ??? cyclophosphamide (CYTOXAN) 1,530 mg IVPB  750 mg/m2 (Treatment Plan Recorded) Intravenous Once Guerry Bruin, MD       ??? OKAY TO SEND MEDICATION/CHEMOTHERAPY TO UNIT   Other Once Molli Hazard  Pamelia Hoit, MD       ??? ondansetron [facility]) tablet 24 mg  24 mg Oral Once Guerry Bruin, MD       ??? sodium chloride (NS) 0.9 % infusion  100 mL/hr Intravenous Continuous Guerry Bruin, MD 100 mL/hr at 08/13/19 1224 100 mL/hr at 08/13/19 1224   ??? tbo-filgrastim (GRANIX) injection 480 mcg  480 mcg Subcutaneous Once Vernie Murders, AGNP           Assessment: KatrinaGrabe is a 79 y.o. female with CLL and CLL-related ITP being treated currently with Cycle 2 Day 1 of RCD on 08/13/19    Plan:  1. CLL/CLL-related ITP  - Cycle 2 Day 1 RCD on 08/13/19; okay per MD to proceed with platelets < 50 and current LFTs  - Day 1 dexamethasone will be given in infusion center; instructed patient to [facility] and counseled her to take 3 tablets (12 mg) by mouth once daily in the morning with food for the next 6 days (4/15-4/20) and then stop. She verbalized understanding of this plan    2. Hypertension  - BP 165/77 today; recent previous readings SBP 148-170 and DBP 67-80  - Start amlodipine 5 mg PO Qday - [facility] and patient notified    3. Headache  - to be evaluated by NP at [facility] today in the setting of elevated bp, low platelets, and 8 out of 10 pain    4. Medication reconciliation  - plan to follow-up with patient to review her medications later this week when she is at home  - confirmed she stopped atorvastatin as previously directed  - states she is taking entecavir daily (refill to be delivered from [facility] on 4/15)    F/u:  Future Appointments   Date Time Provider Department Center   08/13/2019  2:00 PM Kaitlyn Marie Buhlinger, CPP [id number]   08/20/2019 10:00 AM ONCINF HMOB CHAIR 06 ONCINF [facility]   08/27/2019 10:00 AM ONCINF HMOB CHAIR 08 ONCINF [facility]   09/03/2019 10:00 AM ONCINF HMOB CHAIR 11 ONCINF [facility]   09/10/2019  9:00 AM ADULT ONC LAB [facility]   09/10/2019 10:00 AM Sean Marrian Salvage, AGNP [id number]           I spent 10 minutes on the phone with the patient on the date of service. I spent an additional 10 minutes on pre- and post-visit activities on the date of service.     The patient was physically located in [place] or a state in which I am permitted to provide care. The patient and/or parent/guardian understood that s/he may incur co-pays and cost sharing, and agreed to the telemedicine visit. The visit was reasonable and appropriate under the circumstances given the patient's presentation at the time.    The patient and/or parent/guardian has been advised of the potential risks and limitations of this mode of treatment (including, but not limited to, the absence of in-person examination) and has agreed to be treated using telemedicine. The patient's/patient's family's questions regarding telemedicine have been answered.     If the visit was completed in an ambulatory setting, the patient and/or parent/guardian has also been advised to contact their provider???s office for worsening conditions, and seek emergency medical treatment and/or call 911 if the patient deems either necessary.      Dallas Schimke, PharmD, CPP  BMT/Leukemia Clinical Pharmacist Practitioner

## 2019-08-13 NOTE — Unmapped (Signed)
Infusion Center Progress Note    Patient Name: Katrina Gould  Patient Age: 79 y.o.  Encounter Date: 08/13/2019    Reason for visit  Chief Complaint: Headache  Today is CYCLE 2/ DAY 1 of RCD therapy.         Assessment/Plan:    Acute on chronic headache  Patient with documented history of headache over the last few years with previous head CT (9/19, 11/20)  performed with no concerns for intracranial abnormality or masses. Presents today with acute on chronic HA in the occipital region x1 week. Follows with Dr. Dareen Piano her PCP, but has also been seen by ENT and neurology in the past.     Labs:  CBC w/ Diff, CMP   --Thrombocytopenia Plt 34k    Imaging: CT Head w/o Contrast to evaluate for intracranial hemorrhage in setting of HTN and TCP   Findings: There is no midline shift. No mass lesion. There is no evidence of acute infarct. No acute intracranial hemorrhage. No fractures are evident. The sinuses are pneumatized. Polyp/mucous retention cyst in the left anterior maxillary sinus. Metallic dental hardware. Mild left TMJ osteoarthrosis. Carotid siphon calcifications, similar to prior.    --Fioricet 50mg /300mg /40mg  1 tablet every 4 hours as needed for HA x 7 days supply   --Patient will notify primary team for change in HA or no improvement with medication prescribed. She will continue other therapies such as ice packs, heat and rest as needed to relieve discomfort.     Disposition: Patient discharged from the infusion clinicand after CT scan in stable condition. I called her this evening to discuss CT results and plan to trial Fioricet for HA. Prescription sent to pharmacy on file.        I have discussed the case (exam, laboratory results, and plan) with Dr. Starr Sinclair. Patient and team are all in agreement with plan outlined above.      Disposition:    Subjective/HPI:  [name] is an 79 y.o. patient with CLL who comes into the infusion clinic for scheduled C2D1 RCD therapy. She c/o HA in the back of her head since last Monday when she saw her oncology provider. The HA is best described as a throbbing/spasm, 7/10, constant pain. She notes it hurts all the time. She had one episode of vomiting yesterday after eating a salad, otherwise denies nausea or vomiting a/w HA. She denies AMS, LOC, seizures, eye pain, facial pain, photophobia, phonophobia, osmophobia, changes in vision or hearing, worsening HA with coughing or sneezing. HA seems to improve slightly when she lies down to rest. She has not had relief with Tylenol, ice packs or vibrating heating pad therapy. She brought a mouthguard to trial at bedtime thinking maybe she was grinding her teeth in her sleep and this was leading to HA, but has not had any relief with use. Denies gait disturbances or paresthesia.     She has been follows with her PCP Dr. Einar Crow and has also seen neurologist in the past. She reports she received an injection to the back of her head for HA that seem to help in the past. Chart review appears she received an occipital nerve block for severe occipital neuralgia 08/16/2017. The HA she is experiencing today is no worse than what she experienced previously. She has been on Topamax, but is no longer taking. She is taking Tizanidine at bedtime without relief.       Oncology History:  Oncology History Overview Note   CLL  First seen in oncology clinic locally 03/31/16    Had a left axillary biopsy 03/15/16 that confirmed CLL/SLL: Flow showed monoclonal B-cell population with CD5, CD23, kappa light chain. IHC showed positive CD20 (diffuse), negative for cyclin D1.    No constitutional symptoms except some sweats    Labs: Hb 12.0, Hct 34.8, plt 147. WBC was 10.6 with ANC 2.5. ALC was 7.5K.    CLL FISH studies: normal panel. No cells with 11q, tri 12, 17p or 13q, and the 11;14 translocation also not detected.    Karyotype: 46,XX,der(4)t(4;4)(p14;q21)[3]/45,XX,dic(6;17)(q12;p11.2) (no loss of TP53 on FISH)    Molecular: No TP53 mutation    CLL-IPI: As per Lancet Oncol Vol 15 October 2014  Includes (1) TP53 status (no abnormalities vs. 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 MRD = 0.32%.    05/15/18: Repeated BM bx given persistent low counts - ~1% CLL in otherwise normal appearing marrow. Myeloid mutation panel negative. Karyotype: 45,XX,dic(6;17)(q12;p11.2)[2]/46,XX[18].       CLL (chronic lymphocytic leukemia) (CMS-HCC)   03/15/2016 Initial Diagnosis    CLL (chronic lymphocytic leukemia) (RAF-HCC)     09/14/2017 - 10/05/2017 Chemotherapy    Rituximab weekly x4  No response     09/14/2017 - 10/05/2017 Chemotherapy    Rituximab x 4     10/23/2017 -  Chemotherapy    Began ven/obi     03/19/2018 Adverse Reaction    Obinutuzumab held given persistent thrombocytopenia. Marrow performed on 03/22/18, patient with no overt CLL (0.32% by MRD flow testing from [facility]). Do not plan to give 6th cycle of obi (unless counts recover in a reasonable timeframe).     03/22/2018 Biopsy    Bone marrow, right iliac, aspiration and biopsy  -  Normocellular bone marrow (30%) with erythroid-predominant trilineage hematopoiesis   -  Suspicious for low level (<5%) residual involvement by chronic lymphocytic leukemia in a background of reactive lymphoid aggregates  -  Routine cytogenetic results reveal a normal karyotype; FISH (CLL panel) results are normal in a limited analysis; see details below (case [id number]).  -  Flow cytometric CLL MRD results reveal a CD5-positive, kappa light chain-restricted B-cell population is detected; MRD = 0.32%; see details in the Referral Testing Report in the Media Tab.     04/09/2018 Adverse Reaction    ANC down to 0.2. Held venetoclax (was previously at 200 mg daily).     05/15/2018 Biopsy    Bone marrow, right iliac, aspiration and biopsy  -  Mildly hypercellular bone marrow (40%) with trilineage hematopoiesis and persistent low level (less than 1% of cells) involvement by chronic lymphocytic leukemia by flow cytometric  analysis  -  Numerous reactive lymphoid aggregates  -  No significant morphologic dyspoiesis or increase in blasts  -  Routine cytogenetic results reveal an abnormal karyotype; FISH (CLL panel) results are normal; see details below (case [id number]).  -  Myeloid mutation panel results reveal no mutations were identified in the 34 genes analyzed; see details below (case [id number]).     03/26/2019 Biopsy    Bone marrow, right iliac, aspiration and biopsy  -  Slightly hypercellular bone marrow (40%) with persistent low-level involvement by patient's known chronic lymphocytic leukemia, representing approximately 5% of marrow cells by PAX5 immunohistochemical stain.  -  Increased megakaryocytes identified on bone marrow aspirate smears (see Comment)  -  No significant morphologic dyspoiesis or increase in blasts.  -  Routine cytogenetic analysis is pending.  -  Myeloid mutation panel is pending.    Although not a specific finding, increased megakaryocytes can be seen in the setting of immune-mediated thrombocytopenia (ITP).     B-cell chronic lymphocytic leukemia (CMS-HCC)   09/05/2017 - 10/11/2017 Chemotherapy    Chemotherapy Treatment    Treatment Goal Control   Line of Treatment [No plan line of treatment]   Plan Name OP RITUXIMAB    Start Date 09/14/2017   End Date 10/05/2017   Provider Pernell Dupre, MD   Chemotherapy riTUXimab (RITUXAN) 712.5 mg in sodium chloride (NS) 0.9 % 500 mL IVPB, 375 mg/m2 = 712.5 mg, Intravenous, Once, 1 of 1 cycle  Administration: 712.5 mg (09/14/2017)  riTUXimab (RITUXAN) 712.5 mg in sodium chloride (NS) 0.9 % 250 mL rapid infusion, 375 mg/m2 = 712.5 mg, Intravenous, Once, 1 of 1 cycle  Administration: 712.5 mg (09/21/2017), 712.5 mg (09/28/2017), 712.5 mg (10/05/2017)        10/16/2017 Initial Diagnosis    Chronic lymphocytic leukemia, Rai stage IV (CMS-HCC)     10/23/2017 - 03/18/2018 Chemotherapy    OP OBINUTUZUMAB AND VENETOCLAX  chlorambucil 0.5 mg/kg PO on days 1, 15, obinutuzumab 100 mg IV on day 1, obinutuzumab 900 mg IV on day 2, obinutuzumab 1,000 mg IV on days 8,15 on Cycle 1, then obinutuzumab 1,000 mg IV on day 1 for sequent cycles, every 28 days.     04/07/2019 - 06/30/2019 Chemotherapy    OP CLL OBINUTUZUMAB  chlorambucil 0.5 mg/kg PO on days 1, 15, obinutuzumab 100 mg IV on day 1, obinutuzumab 900 mg IV on day 2, obinutuzumab 1,000 mg IV on days 8,15 on Cycle 1, then obinutuzumab 1,000 mg IV on day 1 for sequent cycles, every 28 days.     07/16/2019 -  Chemotherapy    OP RCD CLL ITP  riTUXimab IV 375 mg/m2 on day 1, cyclophosphamide 750 mg/m2 IV on day 1, vinCRIStine 1.4 mg/m2 IV on day 1, predniSONE 100 mg PO on days 1-5, every 21 days.         Allergies:  Allergies   Allergen Reactions   ??? Baclofen      made me go crazy       Medications:  Prior to Admission medications    Medication Dose, Route, Frequency   amLODIPine (NORVASC) 5 MG tablet 5 mg, Oral, Daily (standard)   butalbital-acetaminophen-caff (FIORICET) 50-300-40 mg cap 1 capsule, Oral, Every 4 hours PRN   dexAMETHasone (DECADRON) 4 MG tablet Take 12 mg (3 tablets) by mouth in the morning once daily on Days 2-7 of each cycle   entecavir (BARACLUDE) 0.5 MG tablet 0.5 mg, Oral, Daily (standard)   fluticasone propion-salmeterol (ADVAIR)  100-50 mcg/dose diskus 2 puffs, Inhalation, Daily   loperamide (IMODIUM A-D) 2 mg tablet 2 mg, Oral, 3 times a day PRN   LORazepam (ATIVAN) 0.5 MG tablet 0.5 mg, Oral, Nightly PRN   metoprolol succinate (TOPROL-XL) 25 MG 24 hr tablet 25 mg, Oral, Daily (standard)   mirtazapine (REMERON) 7.5 MG tablet 7.5 mg, Oral, Nightly   multivitamin (TAB-A-VITE/THERAGRAN) per tablet 1 tablet, Oral, Daily (standard)   mv-mn-[id number]-hc124 (AIR[place]ORNE, ASCORBATE SODIUM,) 333-1.7 mg Chew 2 tablets, Oral, 3 times a day PRN   nystatin (MYCOSTATIN) 100,000 unit/mL suspension 500,000 Units, Oral, 4 times a day   pantoprazole (PROTONIX) 40 MG tablet Oral, Daily (standard)   prochlorperazine (COMPAZINE) 10 MG tablet 10 mg, Oral, Every 6 hours PRN   sertraline (ZOLOFT) 50 MG tablet 50 mg, Oral, Daily (standard)   tiZANidine (ZANAFLEX) 2 MG tablet 2 mg, Oral, Nightly PRN       Review of Systems:  A complete review of systems was obtained including: General/Constitutional, Integumentary, HEENT, Respiratory, Cardiovascular, Gastrointestinal, Genitourinary, Musculoskeletal, Neurological, Hematologic/lymphatic, Endocrine, Behvioral/Psych, Allergic/Immunologic systems. It is negative or non-contributory to the patient???s management except for positives mentioned in HPI.     Physical Exam:  Vital Signs:  BP 165/77  - Pulse 85  - Temp 37.1 ??C (98.8 ??F) (Oral)  - Resp 16  - Wt 87.6 kg (193 lb 2 oz)  - BMI 29.96 kg/m??      General:               resting comfortably, in no acute distress, well appearing, well-hydrated, well nourished  HEENT:               normocephalic, atraumatic, EOMI, sclerae non-icteric, conjunctiva clear, vision intact, hearing intact, no neck masses, oral muscosa moist, pink, PERRLA  Neck:                   no lymphadenopathy, normal ROM; c/o stiffness with turning head to the right d/t arthritis   Cardiovascular:   S1S2 regular rate and rhythm. no murmurs, rubs, or gallops. warm and well-perfused.  Respiratory:         clear to auscultation bilaterally no wheezes, rhonchi, or rales no accessory muscle use  Musculoskeletal:  decreased ROM in right shoulder d/t arthritis   Extremities:          decreased ROM in right knee d/t arthritis  Skin:                    warm, dry and no rashes, lesions or breakdown on clothed exam  Heme/Lymph/Immuno:  No cervical and supraclavicular lymphadenopathy appreciated  Neuro:                 alert & oriented x 4, normal speech, cranial nerves II through XII intact, motor and sensory grossly normal bilaterally, normal muscle tone, no tremors, strength 4/5 RUE 5/5 LUE & BLE, steady self upon rising from seated position, normal gait and station independently   CVAD: RCW PAC accessed  - nontender, no erythema or exudate; Dressing CDI.      Results:  Infusion on 08/13/2019   Component Date Value Ref Range Status   ??? Sodium 08/13/2019 139  135 - 145 mmol/L Final   ??? Potassium 08/13/2019 4.0  3.5 - 5.0 mmol/L Final   ??? Chloride 08/13/2019 105  98 - 107 mmol/L Final   ??? Anion Gap 08/13/2019 10  7 - 15 mmol/L Final   ??? CO2 08/13/2019 24.0  22.0 - 30.0 mmol/L Final   ??? BUN 08/13/2019 15  7 - 21 mg/dL Final   ??? Creatinine 08/13/2019 0.75  0.60 - 1.00 mg/dL Final   ??? BUN/Creatinine Ratio 08/13/2019 20   Final   ??? EGFR CKD-EPI Non-African American,* 08/13/2019 76  >=60 mL/min/1.44m2 Final   ??? EGFR CKD-EPI African American, Fem* 08/13/2019 87  >=60 mL/min/1.3m2 Final   ??? Glucose 08/13/2019 145  70 - 179 mg/dL Final   ??? Calcium 16/01/9603 8.8  8.5 - 10.2 mg/dL Final   ??? Albumin 54/12/8117 3.7  3.5 - 5.0 g/dL Final   ??? Total Protein 08/13/2019 5.8* 6.5 - 8.3 g/dL Final   ??? Total Bilirubin 08/13/2019 1.1  0.0 - 1.2 mg/dL Final   ??? AST 14/78/2956 74* 14 - 38 U/L Final   ??? ALT 08/13/2019 72* <35 U/L Final   ??? Alkaline Phosphatase 08/13/2019 396* 38 - 126 U/L Final   ??? LDH 08/13/2019 642* 338 - 610 U/L Final   ??? WBC 08/13/2019 2.1* 3.5 - 10.5 10*9/L Final   ??? RBC 08/13/2019 4.46  3.90 - 5.03 10*12/L Final   ??? HGB 08/13/2019 11.9* 12.0 - 15.5 g/dL Final   ??? HCT 21/30/8657 35.4  35.0 - 44.0 % Final   ??? MCV 08/13/2019 79.3* 82.0 - 98.0 fL Final   ??? MCH 08/13/2019 26.7  26.0 - 34.0 pg Final   ??? MCHC 08/13/2019 33.7  30.0 - 36.0 g/dL Final   ??? RDW 84/69/6295 19.4* 12.0 - 15.0 % Final   ??? MPV 08/13/2019 8.4  7.0 - 10.0 fL Final   ??? Platelet 08/13/2019 34* 150 - 450 10*9/L Final   ??? nRBC 08/13/2019 0  <=4 /100 WBCs Final   ??? Neutrophils % 08/13/2019 49.1  % Final   ??? Lymphocytes % 08/13/2019 33.4  % Final   ??? Monocytes % 08/13/2019 14.4  % Final   ??? Eosinophils % 08/13/2019 2.7  % Final   ??? Basophils % 08/13/2019 0.4  % Final   ??? Absolute Neutrophils 08/13/2019 1.0* 1.7 - 7.7 10*9/L Final   ??? Absolute Lymphocytes 08/13/2019 0.7  0.7 - 4.0 10*9/L Final   ??? Absolute Monocytes 08/13/2019 0.3  0.1 - 1.0 10*9/L Final   ??? Absolute Eosinophils 08/13/2019 0.1  0.0 - 0.7 10*9/L Final   ??? Absolute Basophils 08/13/2019 0.0  0.0 - 0.1 10*9/L Final   ??? Anisocytosis 08/13/2019 Moderate* Not Present Final           I personally spent 35 minutes face-to-face and non-face-to-face in the care of this patient, which includes all pre, intra, and post visit time on the date of service.         Ronney Lion, ANP   PAGER [phone]  OFFICE [phone]

## 2019-08-13 NOTE — Unmapped (Signed)
Patient assessed by NP and sent to CT for imaging post infusion as the result of thrombocytopenia and 8/10 headache.

## 2019-08-13 NOTE — Unmapped (Signed)
Infusion on 08/13/2019   Component Date Value Ref Range Status   ??? Sodium 08/13/2019 139  135 - 145 mmol/L Final   ??? Potassium 08/13/2019 4.0  3.5 - 5.0 mmol/L Final   ??? Chloride 08/13/2019 105  98 - 107 mmol/L Final   ??? Anion Gap 08/13/2019 10  7 - 15 mmol/L Final   ??? CO2 08/13/2019 24.0  22.0 - 30.0 mmol/L Final   ??? BUN 08/13/2019 15  7 - 21 mg/dL Final   ??? Creatinine 08/13/2019 0.75  0.60 - 1.00 mg/dL Final   ??? BUN/Creatinine Ratio 08/13/2019 20   Final   ??? EGFR CKD-EPI Non-African American,* 08/13/2019 76  >=60 mL/min/1.22m2 Final   ??? EGFR CKD-EPI African American, Fem* 08/13/2019 87  >=60 mL/min/1.20m2 Final   ??? Glucose 08/13/2019 145  70 - 179 mg/dL Final   ??? Calcium 16/01/9603 8.8  8.5 - 10.2 mg/dL Final   ??? Albumin 54/12/8117 3.7  3.5 - 5.0 g/dL Final   ??? Total Protein 08/13/2019 5.8* 6.5 - 8.3 g/dL Final   ??? Total Bilirubin 08/13/2019 1.1  0.0 - 1.2 mg/dL Final   ??? AST 14/78/2956 74* 14 - 38 U/L Final   ??? ALT 08/13/2019 72* <35 U/L Final   ??? Alkaline Phosphatase 08/13/2019 396* 38 - 126 U/L Final   ??? LDH 08/13/2019 642* 338 - 610 U/L Final   ??? WBC 08/13/2019 2.1* 3.5 - 10.5 10*9/L Final   ??? RBC 08/13/2019 4.46  3.90 - 5.03 10*12/L Final   ??? HGB 08/13/2019 11.9* 12.0 - 15.5 g/dL Final   ??? HCT 21/30/8657 35.4  35.0 - 44.0 % Final   ??? MCV 08/13/2019 79.3* 82.0 - 98.0 fL Final   ??? MCH 08/13/2019 26.7  26.0 - 34.0 pg Final   ??? MCHC 08/13/2019 33.7  30.0 - 36.0 g/dL Final   ??? RDW 84/69/6295 19.4* 12.0 - 15.0 % Final   ??? MPV 08/13/2019 8.4  7.0 - 10.0 fL Final   ??? Platelet 08/13/2019 34* 150 - 450 10*9/L Final   ??? nRBC 08/13/2019 0  <=4 /100 WBCs Final   ??? Neutrophils % 08/13/2019 49.1  % Final   ??? Lymphocytes % 08/13/2019 33.4  % Final   ??? Monocytes % 08/13/2019 14.4  % Final   ??? Eosinophils % 08/13/2019 2.7  % Final   ??? Basophils % 08/13/2019 0.4  % Final   ??? Absolute Neutrophils 08/13/2019 1.0* 1.7 - 7.7 10*9/L Final   ??? Absolute Lymphocytes 08/13/2019 0.7  0.7 - 4.0 10*9/L Final   ??? Absolute Monocytes 08/13/2019 0.3  0.1 - 1.0 10*9/L Final   ??? Absolute Eosinophils 08/13/2019 0.1  0.0 - 0.7 10*9/L Final   ??? Absolute Basophils 08/13/2019 0.0  0.0 - 0.1 10*9/L Final   ??? Anisocytosis 08/13/2019 Moderate* Not Present Final

## 2019-08-13 NOTE — Unmapped (Signed)
Hi,     Pat contacted the Communication Center regarding the following:    - Scheduled to see Kaitlyn in [place] at  2 but she wont  Be done with her infusion in [place] yet.    Please contact at [phone].    Thanks in advance,    Vernie Ammons  [facility] Communication [facility]   [phone]

## 2019-08-14 NOTE — Unmapped (Signed)
AOC Triage Note     Patient: Katrina Gould     Reason for call:  return call    Time call returned: 1416     Phone Assessment: Pt calling to get permission to have a bridge repaired.  The bridge is attached to two other teeth, but no surgery would be required to repair the bridge.    She is also asking about permission to have a perm put in her hair next month and wants to make sure the chemicals would not interfere with any of her cancer treatments/medications.    RN consulted with NN and both are ok as long as the pt is strict with covid precautions.  Pt states understanding that she should call back should the dentist need to do more than repair the bridge.

## 2019-08-14 NOTE — Unmapped (Signed)
Katrina Gould contacted the [employer] requesting to speak with the care team of Katrina Gould to discuss:    Patient called to speak to Dr. Pollyann Savoy about getting dental work done.     Please contact [name] at [phone].        Check Indicates criteria has been reviewed and confirmed with the patient:    [x]  Preferred Name   [x]  DOB and/or MR#  [x]  Preferred Contact Method  [x]  Phone Number(s)   []  MyChart     Thank you,   [name]  [facility] Communication [facility]   [phone]

## 2019-08-15 ENCOUNTER — Ambulatory Visit: Admit: 2019-08-15 | Discharge: 2019-08-16 | Payer: MEDICARE

## 2019-08-15 LAB — COMPREHENSIVE METABOLIC PANEL
ALBUMIN: 3.7 g/dL (ref 3.5–5.0)
ALKALINE PHOSPHATASE: 308 U/L — ABNORMAL HIGH (ref 38–126)
ALT (SGPT): 68 U/L — ABNORMAL HIGH (ref <35)
ANION GAP: 7 mmol/L (ref 7–15)
AST (SGOT): 51 U/L — ABNORMAL HIGH (ref 14–38)
BILIRUBIN TOTAL: 0.5 mg/dL (ref 0.0–1.2)
BLOOD UREA NITROGEN: 26 mg/dL — ABNORMAL HIGH (ref 7–21)
BUN / CREAT RATIO: 30
CALCIUM: 8.6 mg/dL (ref 8.5–10.2)
CHLORIDE: 107 mmol/L (ref 98–107)
CO2: 24 mmol/L (ref 22.0–30.0)
CREATININE: 0.88 mg/dL (ref 0.60–1.00)
EGFR CKD-EPI AA FEMALE: 72 mL/min/{1.73_m2} (ref >=60)
EGFR CKD-EPI NON-AA FEMALE: 62 mL/min/{1.73_m2} (ref >=60)
POTASSIUM: 4.7 mmol/L (ref 3.5–5.0)
PROTEIN TOTAL: 5.6 g/dL — ABNORMAL LOW (ref 6.5–8.3)
SODIUM: 138 mmol/L (ref 135–145)

## 2019-08-15 LAB — CBC W/ AUTO DIFF
BASOPHILS ABSOLUTE COUNT: 0 10*9/L (ref 0.0–0.1)
BASOPHILS RELATIVE PERCENT: 0.2 %
EOSINOPHILS ABSOLUTE COUNT: 0 10*9/L (ref 0.0–0.4)
EOSINOPHILS RELATIVE PERCENT: 0.1 %
HEMATOCRIT: 34.9 % — ABNORMAL LOW (ref 36.0–46.0)
HEMOGLOBIN: 11.3 g/dL — ABNORMAL LOW (ref 12.0–16.0)
LARGE UNSTAINED CELLS: 1 % (ref 0–4)
LYMPHOCYTES ABSOLUTE COUNT: 0.2 10*9/L — ABNORMAL LOW (ref 1.5–5.0)
LYMPHOCYTES RELATIVE PERCENT: 6.8 %
MEAN CORPUSCULAR HEMOGLOBIN CONC: 32.5 g/dL (ref 31.0–37.0)
MEAN CORPUSCULAR HEMOGLOBIN: 27.4 pg (ref 26.0–34.0)
MEAN CORPUSCULAR VOLUME: 84.2 fL (ref 80.0–100.0)
MEAN PLATELET VOLUME: 9.3 fL (ref 7.0–10.0)
MONOCYTES ABSOLUTE COUNT: 0.1 10*9/L — ABNORMAL LOW (ref 0.2–0.8)
MONOCYTES RELATIVE PERCENT: 3 %
NEUTROPHILS ABSOLUTE COUNT: 2.6 10*9/L (ref 2.0–7.5)
NEUTROPHILS RELATIVE PERCENT: 89 %
PLATELET COUNT: 79 10*9/L — ABNORMAL LOW (ref 150–440)
RED CELL DISTRIBUTION WIDTH: 19.1 % — ABNORMAL HIGH (ref 12.0–15.0)
WBC ADJUSTED: 2.9 10*9/L — ABNORMAL LOW (ref 4.5–11.0)

## 2019-08-15 LAB — POLYCHROMASIA

## 2019-08-15 LAB — EGFR CKD-EPI AA FEMALE: Glomerular filtration rate/1.73 sq M.predicted.black:ArVRat:Pt:Ser/Plas/Bld:Qn:Creatinine-based formula (CKD-EPI): 72

## 2019-08-15 LAB — MONOCYTES RELATIVE PERCENT: Monocytes/100 leukocytes:NFr:Pt:Bld:Qn:Automated count: 3

## 2019-08-15 MED ORDER — DEXAMETHASONE 2 MG TABLET
ORAL_TABLET | Freq: Every day | ORAL | 0 refills | 4.00000 days | Status: CP
Start: 2019-08-15 — End: 2019-08-19

## 2019-08-15 NOTE — Unmapped (Signed)
Triage RN returned call to pt to advise that we would like to see her here at the hospital.  She states she would rather be seen in infusion.  She is en route and will be here in about an hour.

## 2019-08-15 NOTE — Unmapped (Addendum)
Infusion Center Progress Note    Patient Name: Katrina Gould  Patient Age: 79 y.o.  Encounter Date: 08/15/2019    Reason for visit  Chief Complaint: Headache     I spent at least 35 minutes with this patient: assessing, performing physical exam with > 50% of time spent in counseling.      Assessment/Plan:  Headaches (Acute on chronic)  May have been exacerbated by dexamethasone  Labs (CMP, CBC-Plt improved at 79 today)  NS 500 ml once  Continue Fioricet as prescribed  Dexamethasone dose reduced to 6mg  daily for the remaining 4 days. Prescription sent to [facility] in [place]. Pt informed and she will pick up her prescription on her way home.  Disposition: Home.   I have discussed the case (exam, laboratory results, and plan) with Katrina Gauss, NP. Patient and team are all in agreement with plan outlined below.    Subjective/HPI:  Katrina Gould is a 79 y.o. year old patient with CLL who is being seen as an emergent visit today for continuing headache since she was seen at the Infusion for the same, day before last. Her headache started acutely, is mainly at the occipital region of her head and  has been ongoing for approximately 10 days today. She describes the pain as throbbing, and constant, nothing agrravates it, but is mildly relieved with the Fioricet which she is still taking every 4 hours. She rates her pain at 9/10 currently. She reports a fall yesterday from being drowsy after taking her meds, no injuries from the fall, no dizziness today.  Also reports that she mopped her whole house this morning went to the hairdresser and then drove herself to the hospital here today despite the headache. She reports she has had headaches most of her adult life but it just seems to hurt a little more this time. 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 She denies any other symptoms including nausea, vomiting, light sensitivity, dizziness, fever, cough, or sore throat.      Oncology History:  Oncology History Overview Note   CLL    First seen in oncology clinic locally 03/31/16    Had a left axillary biopsy 03/15/16 that confirmed CLL/SLL: Flow showed monoclonal B-cell population with CD5, CD23, kappa light chain. IHC showed positive CD20 (diffuse), negative for cyclin D1.    No constitutional symptoms except some sweats    Labs: Hb 12.0, Hct 34.8, plt 147. WBC was 10.6 with ANC 2.5. ALC was 7.5K.    CLL FISH studies: normal panel. No cells with 11q, tri 12, 17p or 13q, and the 11;14 translocation also not detected.    Karyotype: 46,XX,der(4)t(4;4)(p14;q21)[3]/45,XX,dic(6;17)(q12;p11.2) (no loss of TP53 on FISH)    Molecular: No TP53 mutation    CLL-IPI: As per Lancet Oncol Vol 15 October 2014  Includes (1) TP53 status (no abnormalities vs. 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 MRD = 0.32%.    05/15/18: Repeated BM bx given persistent low counts - ~1% CLL in otherwise normal appearing marrow. Myeloid mutation panel negative. Karyotype: 45,XX,dic(6;17)(q12;p11.2)[2]/46,XX[18].       CLL (chronic lymphocytic leukemia) (CMS-HCC)   03/15/2016 Initial Diagnosis    CLL (chronic lymphocytic leukemia) (RAF-HCC)     09/14/2017 - 10/05/2017 Chemotherapy Rituximab weekly x4  No response     09/14/2017 - 10/05/2017 Chemotherapy    Rituximab x 4     10/23/2017 -  Chemotherapy    Began ven/obi     03/19/2018 Adverse Reaction    Obinutuzumab held given persistent thrombocytopenia. Marrow performed on 03/22/18, patient with no overt CLL (0.32% by MRD flow testing from [facility]). Do not plan to give 6th cycle of obi (unless counts recover in a reasonable timeframe).     03/22/2018 Biopsy    Bone marrow, right iliac, aspiration and biopsy  -  Normocellular bone marrow (30%) with erythroid-predominant trilineage hematopoiesis   -  Suspicious for low level (<5%) residual involvement by chronic lymphocytic leukemia in a background of reactive lymphoid aggregates  -  Routine cytogenetic results reveal a normal karyotype; FISH (CLL panel) results are normal in a limited analysis; see details below (case [id number]).  -  Flow cytometric CLL MRD results reveal a CD5-positive, kappa light chain-restricted B-cell population is detected; MRD = 0.32%; see details in the Referral Testing Report in the Media Tab.     04/09/2018 Adverse Reaction    ANC down to 0.2. Held venetoclax (was previously at 200 mg daily).     05/15/2018 Biopsy    Bone marrow, right iliac, aspiration and biopsy  -  Mildly hypercellular bone marrow (40%) with trilineage hematopoiesis and persistent low level (less than 1% of cells) involvement by chronic lymphocytic leukemia by flow cytometric analysis  -  Numerous reactive lymphoid aggregates  -  No significant morphologic dyspoiesis or increase in blasts  -  Routine cytogenetic results reveal an abnormal karyotype; FISH (CLL panel) results are normal; see details below (case [id number]).  -  Myeloid mutation panel results reveal no mutations were identified in the 34 genes analyzed; see details below (case [id number]).     03/26/2019 Biopsy    Bone marrow, right iliac, aspiration and biopsy  -  Slightly hypercellular bone marrow (40%) with persistent low-level involvement by patient's known chronic lymphocytic leukemia, representing approximately 5% of marrow cells by PAX5 immunohistochemical stain.  -  Increased megakaryocytes identified on bone marrow aspirate smears (see Comment)  -  No significant morphologic dyspoiesis or increase in blasts.  -  Routine cytogenetic analysis is pending.  -  Myeloid mutation panel is pending.    Although not a specific finding, increased megakaryocytes can be seen in the setting of immune-mediated thrombocytopenia (ITP).     B-cell chronic lymphocytic leukemia (CMS-HCC)   09/05/2017 - 10/11/2017 Chemotherapy    Chemotherapy Treatment    Treatment Goal Control   Line of Treatment [No plan line of treatment]   Plan Name OP RITUXIMAB    Start Date 09/14/2017   End Date 10/05/2017   Provider Leta Speller Coombs,  MD   Chemotherapy riTUXimab (RITUXAN) 712.5 mg in sodium chloride (NS) 0.9 % 500 mL IVPB, 375 mg/m2 = 712.5 mg, Intravenous, Once, 1 of 1 cycle  Administration: 712.5 mg (09/14/2017)  riTUXimab (RITUXAN) 712.5 mg in sodium chloride (NS) 0.9 % 250 mL rapid infusion, 375 mg/m2 = 712.5 mg, Intravenous, Once, 1 of 1 cycle  Administration: 712.5 mg (09/21/2017), 712.5 mg (09/28/2017), 712.5 mg (10/05/2017)        10/16/2017 Initial Diagnosis    Chronic lymphocytic leukemia, Rai stage IV (CMS-HCC)     10/23/2017 - 03/18/2018 Chemotherapy    OP OBINUTUZUMAB AND VENETOCLAX  chlorambucil 0.5 mg/kg PO on days 1, 15, obinutuzumab 100 mg IV on day 1, obinutuzumab 900 mg IV on day 2, obinutuzumab 1,000 mg IV on days 8,15 on Cycle 1, then obinutuzumab 1,000 mg IV on day 1 for sequent cycles, every 28 days.     04/07/2019 - 06/30/2019 Chemotherapy    OP CLL OBINUTUZUMAB  chlorambucil 0.5 mg/kg PO on days 1, 15, obinutuzumab 100 mg IV on day 1, obinutuzumab 900 mg IV on day 2, obinutuzumab 1,000 mg IV on days 8,15 on Cycle 1, then obinutuzumab 1,000 mg IV on day 1 for sequent cycles, every 28 days.     07/16/2019 -  Chemotherapy    OP RCD CLL ITP  riTUXimab IV 375 mg/m2 on day 1, cyclophosphamide 750 mg/m2 IV on day 1, vinCRIStine 1.4 mg/m2 IV on day 1, predniSONE 100 mg PO on days 1-5, every 21 days.         Allergies:  Allergies   Allergen Reactions   ??? Baclofen      made me go crazy     Medications:  Prior to Admission medications    Medication Dose, Route, Frequency   amLODIPine (NORVASC) 5 MG tablet 5 mg, Oral, Daily (standard)   butalbital-acetaminophen-caff (FIORICET) 50-300-40 mg cap 1 capsule, Oral, Every 4 hours PRN   dexAMETHasone (DECADRON) 4 MG tablet Take 12 mg (3 tablets) by mouth in the morning once daily on Days 2-7 of each cycle   entecavir (BARACLUDE) 0.5 MG tablet 0.5 mg, Oral, Daily (standard)   fluticasone propion-salmeterol (ADVAIR) 100-50 mcg/dose diskus 2 puffs, Inhalation, Daily   loperamide (IMODIUM A-D) 2 mg tablet 2 mg, Oral, 3 times a day PRN   LORazepam (ATIVAN) 0.5 MG tablet 0.5 mg, Oral, Nightly PRN   metoprolol succinate (TOPROL-XL) 25 MG 24 hr tablet 25 mg, Oral, Daily (standard)   mirtazapine (REMERON) 7.5 MG tablet 7.5 mg, Oral, Nightly   multivitamin (TAB-A-VITE/THERAGRAN) per tablet 1 tablet, Oral, Daily (standard)   mv-mn-[id number]-hc124 (AIR[place]ORNE, ASCORBATE SODIUM,) 333-1.7 mg Chew 2 tablets, Oral, 3 times a day PRN   nystatin (MYCOSTATIN) 100,000 unit/mL suspension 500,000 Units, Oral, 4 times a day   pantoprazole (PROTONIX) 40 MG tablet Oral, Daily (standard)   prochlorperazine (COMPAZINE) 10 MG tablet 10 mg, Oral, Every 6 hours PRN   sertraline (ZOLOFT) 50 MG tablet 50 mg, Oral, Daily (standard)   tiZANidine (ZANAFLEX) 2 MG tablet 2 mg, Oral, Nightly PRN       Review of Systems:  A complete review of systems was obtained including: General/Constitutional, Integumentary, HEENT, Respiratory, Cardiovascular, Gastrointestinal, Genitourinary, Musculoskeletal, Neurological, Behvioral/Psych systems. It is negative or non-contributory to the patient???s management except for positives mentioned in HPI.     Physical Exam: Vital Signs:  Wt Readings from Last 1 Encounters:   08/13/19 87.6 kg (193 lb 2 oz)     Temp Readings from Last  1 Encounters:   08/13/19 37.1 ??C (98.8 ??F) (Oral)     BP Readings from Last 1 Encounters:   08/13/19 165/77     Pulse Readings from Last 1 Encounters:   08/13/19 85       General:               resting comfortably, in no acute distress. Alert and oriented x4. Very pleasant.   HEENT: Normocephalic, EOMI, PERRLA, neck is supple, no lymphadenopathy noted   Cardiovascular:   regular rate and rhythm. no murmurs, rubs, or gallops. warm and well-perfused. radial pulses 2+ bilaterally.  Respiratory:         clear to auscultation bilaterally no wheezes, rhonchi, or rales no accessory muscle use  Gastrointestinal:  soft, non-distended and non-tender to palpation  Musculoskeletal:  full ROM of upper and lower extremities and no bony pain or tenderness  Extremities:          extremities are warm and without edema, distal pulses are full and symmetric  Skin:                    warm, dry and no rashes, lesions or breakdown  Psychiatric:          appropriate, calm and cooperative      Results:  No visits with results within 1 Day(s) from this visit.   Latest known visit with results is:   Infusion on 08/13/2019   Component Date Value Ref Range Status   ??? Sodium 08/13/2019 139  135 - 145 mmol/L Final   ??? Potassium 08/13/2019 4.0  3.5 - 5.0 mmol/L Final   ??? Chloride 08/13/2019 105  98 - 107 mmol/L Final   ??? Anion Gap 08/13/2019 10  7 - 15 mmol/L Final   ??? CO2 08/13/2019 24.0  22.0 - 30.0 mmol/L Final   ??? BUN 08/13/2019 15  7 - 21 mg/dL Final   ??? Creatinine 08/13/2019 0.75  0.60 - 1.00 mg/dL Final   ??? BUN/Creatinine Ratio 08/13/2019 20   Final   ??? EGFR CKD-EPI Non-African American,* 08/13/2019 76  >=60 mL/min/1.65m2 Final   ??? EGFR CKD-EPI African American, Fem* 08/13/2019 87  >=60 mL/min/1.60m2 Final   ??? Glucose 08/13/2019 145  70 - 179 mg/dL Final   ??? Calcium 16/01/9603 8.8  8.5 - 10.2 mg/dL Final   ??? Albumin 54/12/8117 3.7 3.5 - 5.0 g/dL Final   ??? Total Protein 08/13/2019 5.8* 6.5 - 8.3 g/dL Final   ??? Total Bilirubin 08/13/2019 1.1  0.0 - 1.2 mg/dL Final   ??? AST 14/78/2956 74* 14 - 38 U/L Final   ??? ALT 08/13/2019 72* <35 U/L Final   ??? Alkaline Phosphatase 08/13/2019 396* 38 - 126 U/L Final   ??? LDH 08/13/2019 642* 338 - 610 U/L Final   ??? WBC 08/13/2019 2.1* 3.5 - 10.5 10*9/L Final   ??? RBC 08/13/2019 4.46  3.90 - 5.03 10*12/L Final   ??? HGB 08/13/2019 11.9* 12.0 - 15.5 g/dL Final   ??? HCT 21/30/8657 35.4  35.0 - 44.0 % Final   ??? MCV 08/13/2019 79.3* 82.0 - 98.0 fL Final   ??? MCH 08/13/2019 26.7  26.0 - 34.0 pg Final   ??? MCHC 08/13/2019 33.7  30.0 - 36.0 g/dL Final   ??? RDW 84/69/6295 19.4* 12.0 - 15.0 % Final   ??? MPV 08/13/2019 8.4  7.0 - 10.0 fL Final   ??? Platelet 08/13/2019 34* 150 - 450 10*9/L Final   ???  nRBC 08/13/2019 0  <=4 /100 WBCs Final   ??? Neutrophils % 08/13/2019 49.1  % Final   ??? Lymphocytes % 08/13/2019 33.4  % Final   ??? Monocytes % 08/13/2019 14.4  % Final   ??? Eosinophils % 08/13/2019 2.7  % Final   ??? Basophils % 08/13/2019 0.4  % Final   ??? Absolute Neutrophils 08/13/2019 1.0* 1.7 - 7.7 10*9/L Final   ??? Absolute Lymphocytes 08/13/2019 0.7  0.7 - 4.0 10*9/L Final   ??? Absolute Monocytes 08/13/2019 0.3  0.1 - 1.0 10*9/L Final   ??? Absolute Eosinophils 08/13/2019 0.1  0.0 - 0.7 10*9/L Final   ??? Absolute Basophils 08/13/2019 0.0  0.0 - 0.1 10*9/L Final   ??? Anisocytosis 08/13/2019 Moderate* Not Present Final         Leeroy Cha, FNP

## 2019-08-15 NOTE — Unmapped (Signed)
Contact pt per Dr. Pollyann Savoy and l/m checking in re: steroids and headache. Asked pt to return call to Nurse Triage or send MyChart and let us know how she is today.

## 2019-08-15 NOTE — Unmapped (Signed)
Received report that Ms. Breisch is experiencing worsening headaches, dizziness and yesterday fell at home (denying head injury).     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 She was evaluated 2 days ago for headache and CT was negative for bleed.    I communicated with Starr Sinclair, MD regarding situation.     Recommend evaluation by clinician infusion center or ED   -Depending on exam/clinical concern, consider re-imaging including possible MRI to evaluate worsening headaches/dizziness.  Patient may not be tolerating the steroid dose - recommend dose-reduction of the steroids to 4-6mg  (planned through day 7).    Langley Gauss, AGPCNP-BC  Nurse Practitioner  Hematology/Oncology  [facility]

## 2019-08-15 NOTE — Unmapped (Signed)
1540: Patient present for emergent work up of persistent headache and dizziness. Patient alert and oriented upon arrival.  Port accessed; labs drawn and sent for analysis. Patient comfortable with no requests at this time.     1630: Patient transferred to bed 49, report given to A. Ramond Dial, Charity fundraiser. Patient alert, oriented and in stable condition at handoff.

## 2019-08-15 NOTE — Unmapped (Signed)
Hi,     Leanord Hawking contacted the Communication Center requesting to speak with the care team of Katrina Gould to discuss:    Patient reports headache has not abated. Also reports that she fell yesterday in her yard due to dizziness.    Please contact Ms. Ahart at [phone].      Thank you,   Kelli Hope  [facility]   [phone]

## 2019-08-15 NOTE — Unmapped (Signed)
[facility] Triage Note     Patient: Katrina Gould     Reason for call:  return call    Time call returned:      Phone Assessment: pt reports that headache is actually worse today and that she is taking medication every 4 hours.  She also reports a fall yesterday in the yard due to dizziness.  She states she fell on a bucket and bruised her hip and breast, she denies hitting her head, n/v or loss of consciousness.      Goal for this communication:  Follow up on headache     Triage Recommendations:   RN will relay message to team and someone will call her back with further instructions

## 2019-08-16 NOTE — Unmapped (Signed)
Hospital Outpatient Visit on 08/15/2019   Component Date Value Ref Range Status   ??? Sodium 08/15/2019 138  135 - 145 mmol/L Final   ??? Potassium 08/15/2019 4.7  3.5 - 5.0 mmol/L Final   ??? Chloride 08/15/2019 107  98 - 107 mmol/L Final   ??? Anion Gap 08/15/2019 7  7 - 15 mmol/L Final   ??? CO2 08/15/2019 24.0  22.0 - 30.0 mmol/L Final   ??? BUN 08/15/2019 26* 7 - 21 mg/dL Final   ??? Creatinine 08/15/2019 0.88  0.60 - 1.00 mg/dL Final   ??? BUN/Creatinine Ratio 08/15/2019 30   Final   ??? EGFR CKD-EPI Non-African American,* 08/15/2019 62  >=60 mL/min/1.94m2 Final   ??? EGFR CKD-EPI African American, Fem* 08/15/2019 72  >=60 mL/min/1.54m2 Final   ??? Glucose 08/15/2019 149  70 - 179 mg/dL Final   ??? Calcium 09/81/1914 8.6  8.5 - 10.2 mg/dL Final   ??? Albumin 78/29/5621 3.7  3.5 - 5.0 g/dL Final   ??? Total Protein 08/15/2019 5.6* 6.5 - 8.3 g/dL Final   ??? Total Bilirubin 08/15/2019 0.5  0.0 - 1.2 mg/dL Final   ??? AST 30/86/5784 51* 14 - 38 U/L Final   ??? ALT 08/15/2019 68* <35 U/L Final   ??? Alkaline Phosphatase 08/15/2019 308* 38 - 126 U/L Final   ??? WBC 08/15/2019 2.9* 4.5 - 11.0 10*9/L Final   ??? RBC 08/15/2019 4.14  4.00 - 5.20 10*12/L Final   ??? HGB 08/15/2019 11.3* 12.0 - 16.0 g/dL Final   ??? HCT 69/62/9528 34.9* 36.0 - 46.0 % Final   ??? MCV 08/15/2019 84.2  80.0 - 100.0 fL Final   ??? MCH 08/15/2019 27.4  26.0 - 34.0 pg Final   ??? MCHC 08/15/2019 32.5  31.0 - 37.0 g/dL Final   ??? RDW 41/32/4401 19.1* 12.0 - 15.0 % Final   ??? MPV 08/15/2019 9.3  7.0 - 10.0 fL Final   ??? Platelet 08/15/2019 79* 150 - 440 10*9/L Final   ??? Variable HGB Concentration 08/15/2019 Slight* Not Present Final   ??? Neutrophils % 08/15/2019 89.0  % Final   ??? Lymphocytes % 08/15/2019 6.8  % Final   ??? Monocytes % 08/15/2019 3.0  % Final   ??? Eosinophils % 08/15/2019 0.1  % Final   ??? Basophils % 08/15/2019 0.2  % Final   ??? Absolute Neutrophils 08/15/2019 2.6  2.0 - 7.5 10*9/L Final   ??? Absolute Lymphocytes 08/15/2019 0.2* 1.5 - 5.0 10*9/L Final   ??? Absolute Monocytes 08/15/2019 0.1* 0.2 - 0.8 10*9/L Final   ??? Absolute Eosinophils 08/15/2019 0.0  0.0 - 0.4 10*9/L Final   ??? Absolute Basophils 08/15/2019 0.0  0.0 - 0.1 10*9/L Final   ??? Large Unstained Cells 08/15/2019 1  0 - 4 % Final   ??? Microcytosis 08/15/2019 Slight* Not Present Final   ??? Anisocytosis 08/15/2019 Moderate* Not Present Final   ??? Hypochromasia 08/15/2019 Marked* Not Present Final

## 2019-08-16 NOTE — Unmapped (Signed)
Pt here for h/a, treatment as noted. Patient discharged feeling better no concerns

## 2019-08-18 NOTE — Unmapped (Signed)
Patient driving on way to [facility] for headache.

## 2019-08-20 ENCOUNTER — Ambulatory Visit: Admit: 2019-08-20 | Discharge: 2019-08-20 | Payer: MEDICARE | Attending: Adult Health | Primary: Adult Health

## 2019-08-20 ENCOUNTER — Ambulatory Visit: Admit: 2019-08-20 | Discharge: 2019-08-20 | Payer: MEDICARE | Attending: Pharmacist | Primary: Pharmacist

## 2019-08-20 ENCOUNTER — Ambulatory Visit: Admit: 2019-08-20 | Discharge: 2019-08-20 | Payer: MEDICARE

## 2019-08-20 ENCOUNTER — Other Ambulatory Visit: Admit: 2019-08-20 | Discharge: 2019-08-20 | Payer: MEDICARE

## 2019-08-20 DIAGNOSIS — D693 Immune thrombocytopenic purpura: Principal | ICD-10-CM

## 2019-08-20 DIAGNOSIS — R7401 Transaminitis: Principal | ICD-10-CM

## 2019-08-20 DIAGNOSIS — C9110 Chronic lymphocytic leukemia of B-cell type not having achieved remission: Principal | ICD-10-CM

## 2019-08-20 LAB — CBC W/ AUTO DIFF
BASOPHILS ABSOLUTE COUNT: 0 10*9/L (ref 0.0–0.1)
BASOPHILS RELATIVE PERCENT: 0.4 %
EOSINOPHILS ABSOLUTE COUNT: 0 10*9/L (ref 0.0–0.4)
EOSINOPHILS RELATIVE PERCENT: 0.7 %
HEMATOCRIT: 35.9 % — ABNORMAL LOW (ref 36.0–46.0)
HEMOGLOBIN: 11.6 g/dL — ABNORMAL LOW (ref 12.0–16.0)
LARGE UNSTAINED CELLS: 3 % (ref 0–4)
LYMPHOCYTES ABSOLUTE COUNT: 0.5 10*9/L — ABNORMAL LOW (ref 1.5–5.0)
LYMPHOCYTES RELATIVE PERCENT: 25.9 %
MEAN CORPUSCULAR HEMOGLOBIN CONC: 32.2 g/dL (ref 31.0–37.0)
MEAN CORPUSCULAR HEMOGLOBIN: 27.1 pg (ref 26.0–34.0)
MEAN CORPUSCULAR VOLUME: 84 fL (ref 80.0–100.0)
MONOCYTES ABSOLUTE COUNT: 0.1 10*9/L — ABNORMAL LOW (ref 0.2–0.8)
MONOCYTES RELATIVE PERCENT: 5.6 %
NEUTROPHILS ABSOLUTE COUNT: 1.2 10*9/L — ABNORMAL LOW (ref 2.0–7.5)
NEUTROPHILS RELATIVE PERCENT: 64.3 %
RED BLOOD CELL COUNT: 4.27 10*12/L (ref 4.00–5.20)
RED CELL DISTRIBUTION WIDTH: 17.4 % — ABNORMAL HIGH (ref 12.0–15.0)
WBC ADJUSTED: 1.9 10*9/L — ABNORMAL LOW (ref 4.5–11.0)

## 2019-08-20 LAB — NEUTROPHILS ABSOLUTE COUNT: Neutrophils:NCnc:Pt:Bld:Qn:Automated count: 1.2 — ABNORMAL LOW

## 2019-08-20 NOTE — Unmapped (Signed)
[facility] Visit    Patient Name: Katrina Gould  Patient Age: 79 y.o.  Encounter Date: 08/20/2019      Primary Care Provider:  Lauro Regulus, MD    Referring Physician:  Self, Referred    REASON FOR VISIT:   79 y.o. female here to follow up for her CLL.    ASSESSMENT/PLAN     Katrina Gould is a 79 y.o. female who presents for follow of her CLL/SLL, Rai stage 4. Disease markers: 46,XX,der(4)t(4;4)(p14;q21). IGHV unmutated (0%, 3-33*01). B61m 5.05. No TP53 mutation. High-risk by CLL-IPI (6 points).    Platelet count downtrended despite the eltrombopag. BM bx 03/26/19 most consistent with ITP and CLL. After discussion with patient/son, stopped eltrombopag and started obinutuzumab 04/2019. Patient has now received 5 doses without improvement in platelets.     Given refractory nature of CLL-related ITP, initiated RCD. In a small trial of RCD conducted in 8 CLL patients with steroid refractory AIHA, all 8 patients achieved remission Chales Abrahams et al, Leukemia 2002). While the patient has not yet been treated with a BTK inhibitor for CLL-directed treatment, given that the patient's thrombocytopenia is her major problem with a controlled lymphocyte count and very little marrow involvement by her CLL, we recommend RCD rather than BTK inhibitor given its effectiveness specifically for CLL-related autoimmune cytopenias. If no response to RCD, can trial BTK inhibitor.    RCD regimen is as follows and is given q4 weeks Chales Abrahams et al, Leukemia 2002):  -- rituximab 375 mg/m2 on D1  -- cyclophosphamide 750 mg/m2 on D1  -- dexamethasone 12 mg on D1-7    Patient is now D8 C2 RCD. Her platelets have climbed to 84k. She had difficulty tolerating the first week with severe headaches and drunk feeling.  Brain imaging was negative for any acute process. The dexamethasone dose was cut to 6mg  for days 4-7. Her headache has now resolved. The drunk feeling persists. She reports she has experienced it before, was evaluated by ENT who told her it was due to stress. We will monitor her response to this cycle and will discuss steroid dosing prior to starting the next cycle of therapy.     Plan liver ultrasound due to transaminitis (levels improved today). Repeat HepB DNA ordered but not collected. Will collect at next visit.    Night sweats - nightly, persist    She is constipated with BMs q3-4 days - recommended a bowel regimen - senna and PRN miralax.    Plans and Recommendations:    1. CLL with CLL-related ITP  - C2D8 RCD today (dexamethasone was decreased to 6 mg/day for days 4-7 due to headaches  - plan for cycles q4 weeks until best platelet response, plan for no more than 6 cycles, hopefully less  - DISCUSS steroid dosing for next cycle  - compazine, ativan prn for nausea  - weekly labs/transfusion support as needed    2. 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 Hx Platelet transfusion reaction: Patient has had febrile non-hemolytic transfusion reaction x 2. Also had chest pain during reaction possibly related to underlying CAD.  -- infuse platelets more slowly per Transfusion Medicine  -- follows with Cardiology  -- hold ASA while platelets < 50K  -- low threshold to reduce dexamethasone dose  -- monitor sxs    4. 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 Given risk of Hep B reactivation with RTX, will start entecavir.   -- continue entecavir 0.5 mg daily. Confirmed compliance with medication.   - monitor HBV DNA PCR monthly  - follow LFTs closely  -- Continue x 1 year s/p RTX    5. Transaminitis: AST 70, ALT 78 today. Alk phos 304. Has been present  since 06/24/19. And has mildly worsened since that time. Bilirubin normal.   --Will obtain abdominal ultrasound  --hold statin  - plan liver ultrasound    6. Chest pain: Has had past episodes of chest pain, particularly with platelet transfusions. Recent TTE with EF > 55%. Cardiac cath not performed 2/2 thrombocytopenia but stress test recommended when platelets improve.   -- Cardiology follow up.     7. DOE and occasional rapid heart rate  - stable and chronic    8. Arthritis  - continues to use topical CBD oil and this has helped  - May continue prn tylenol but should avoid NSAIDs  - knows to avoid oral CBD oil    9 Constipation  - senna daily and PRN miralax  ??  10. Health Maintenance:   - rec annual flu vaccine  - recommend COVID-19 vaccine, but will need platelets consistently 50K or higher. We will defer until platelets more stable.     I personally spent 40 minutes face-to-face and non-face-to-face in the care of this patient, which includes all pre, intra, and post visit time on the date of service.      Langley Gauss, AGPCNP-BC  Nurse Practitioner  Hematology/Oncology  [facility]    Drs. Sheets/Foster, covering for Dr. Lonni Fix, were available.      INTERVAL HISTORY     Patient accompanied by her son today.     She reports she had a difficult week. She had an awful headache last week. It finally resolved with fioricet and the decreased dose of dexamethasone. She also reports feeling drunk and swimmy-headed and shaky These feelings persist. She reports she had the drunk and swimmy-headed symptoms before. It was evaluated by ENT who told her it was due to stress. She fell once on Thursday.    She has not had any bleeding, nausea/vomiting/photosensitivity.       HEMATOLOGICAL / ONCOLOGICAL HISTORY:     Oncology History Overview Note   CLL    First seen in oncology clinic locally 03/31/16    Had a left axillary biopsy 03/15/16 that confirmed CLL/SLL: Flow showed monoclonal B-cell population with CD5, CD23, kappa light chain. IHC showed positive CD20 (diffuse), negative for cyclin D1.    No constitutional symptoms except some sweats    Labs: Hb 12.0, Hct 34.8, plt 147. WBC was 10.6 with ANC 2.5. ALC was 7.5K.    CLL FISH studies: normal panel. No cells with 11q, tri 12, 17p or 13q, and the 11;14 translocation also not detected.    Karyotype: 46,XX,der(4)t(4;4)(p14;q21)[3]/45,XX,dic(6;17)(q12;p11.2) (no loss of TP53 on FISH)    Molecular: No TP53 mutation    CLL-IPI: As per Lancet Oncol Vol 15 October 2014  Includes (1) TP53 status (no abnormalities vs. Del 17p and/or TP53 mutation - 4 points)=0, (2) IGHV mutation status (mut vs unmut - 2 points)=2 (3) serum B2-microglobulin (</=3.5 vs >3.5 - 2 points)=2 (4) clinical stage (Rai 0 vs Rai I-IV - 1 point)=1 and (5) age (</=65 vs >65 - 1 point)=1    Total: 6 points  Low risk (0-1 points)= 93.2% OS at 5 years  Int risk (2-3 points)= 79.3%  High risk (4-6 points)= 63.3%    Very high risk (7-10 points)= 23.3%     Received rituximab weekly x4 from 09/14/17 to 10/05/17  - For ITP 2/t CLL vs. CLL  - No response    Started ven/obi regimen on 10/23/17  Baseline counts:  WBC 45.9  ALC 2.6  Hb 12.5  Plt 10    03/22/18: BM bx without overt CLL, MRD testing at [facility] revealed CD5-positive, kappa light chain-restricted B-cell  population is detected. MRD = 0.32%.    05/15/18: Repeated BM bx given persistent low counts - ~1% CLL in otherwise normal appearing marrow. Myeloid mutation panel negative. Karyotype: 45,XX,dic(6;17)(q12;p11.2)[2]/46,XX[18].       CLL (chronic lymphocytic leukemia) (CMS-HCC)   03/15/2016 Initial Diagnosis    CLL (chronic lymphocytic leukemia) (RAF-HCC)     09/14/2017 - 10/05/2017 Chemotherapy    Rituximab weekly x4  No response     09/14/2017 - 10/05/2017 Chemotherapy    Rituximab x 4     10/23/2017 -  Chemotherapy    Began ven/obi     03/19/2018 Adverse Reaction    Obinutuzumab held given persistent thrombocytopenia. Marrow performed on 03/22/18, patient with no overt CLL (0.32% by MRD flow testing from [facility]). Do not plan to give 6th cycle of obi (unless counts recover in a reasonable timeframe).     03/22/2018 Biopsy    Bone marrow, right iliac, aspiration and biopsy  -  Normocellular bone marrow (30%) with erythroid-predominant trilineage hematopoiesis   -  Suspicious for low level (<5%) residual involvement by chronic lymphocytic leukemia in a background of reactive lymphoid aggregates  -  Routine cytogenetic results reveal a normal karyotype; FISH (CLL panel) results are normal in a limited analysis; see details below (case [id number]).  -  Flow cytometric CLL MRD results reveal a CD5-positive, kappa light chain-restricted B-cell population is detected; MRD = 0.32%; see details in the Referral Testing Report in the Media Tab.     04/09/2018 Adverse Reaction    ANC down to 0.2. Held venetoclax (was previously at 200 mg daily).     05/15/2018 Biopsy    Bone marrow, right iliac, aspiration and biopsy  -  Mildly hypercellular bone marrow (40%) with trilineage hematopoiesis and persistent low level (less than 1% of cells) involvement by chronic lymphocytic leukemia by flow cytometric analysis  -  Numerous reactive lymphoid aggregates  -  No significant morphologic dyspoiesis or increase in blasts  -  Routine cytogenetic results reveal an abnormal karyotype; FISH (CLL panel) results are normal; see details below (case [id number]).  -  Myeloid mutation panel results reveal no mutations were identified in the 34 genes analyzed; see details below (case [id number]).     03/26/2019 Biopsy    Bone marrow, right iliac, aspiration and biopsy  -  Slightly hypercellular bone marrow (40%) with persistent low-level involvement by patient's known chronic lymphocytic leukemia, representing approximately 5% of marrow cells by PAX5 immunohistochemical stain.  -  Increased megakaryocytes identified on bone marrow aspirate smears (see Comment)  -  No significant morphologic dyspoiesis or increase in blasts.  -  Routine cytogenetic analysis is pending.  -  Myeloid mutation panel is pending.    Although not a specific finding, increased megakaryocytes can be seen in the setting of immune-mediated thrombocytopenia (ITP). B-cell chronic lymphocytic leukemia (CMS-HCC)   09/05/2017 - 10/11/2017 Chemotherapy    Chemotherapy Treatment  Treatment Goal Control   Line of Treatment [No plan line of treatment]   Plan Name OP RITUXIMAB    Start Date 09/14/2017   End Date 10/05/2017   Provider Pernell Dupre, MD   Chemotherapy riTUXimab (RITUXAN) 712.5 mg in sodium chloride (NS) 0.9 % 500 mL IVPB, 375 mg/m2 = 712.5 mg, Intravenous, Once, 1 of 1 cycle  Administration: 712.5 mg (09/14/2017)  riTUXimab (RITUXAN) 712.5 mg in sodium chloride (NS) 0.9 % 250 mL rapid infusion, 375 mg/m2 = 712.5 mg, Intravenous, Once, 1 of 1 cycle  Administration: 712.5 mg (09/21/2017), 712.5 mg (09/28/2017), 712.5 mg (10/05/2017)        10/16/2017 Initial Diagnosis    Chronic lymphocytic leukemia, Rai stage IV (CMS-HCC)     10/23/2017 - 03/18/2018 Chemotherapy    OP OBINUTUZUMAB AND VENETOCLAX  chlorambucil 0.5 mg/kg PO on days 1, 15, obinutuzumab 100 mg IV on day 1, obinutuzumab 900 mg IV on day 2, obinutuzumab 1,000 mg IV on days 8,15 on Cycle 1, then obinutuzumab 1,000 mg IV on day 1 for sequent cycles, every 28 days.     04/07/2019 - 06/30/2019 Chemotherapy    OP CLL OBINUTUZUMAB  chlorambucil 0.5 mg/kg PO on days 1, 15, obinutuzumab 100 mg IV on day 1, obinutuzumab 900 mg IV on day 2, obinutuzumab 1,000 mg IV on days 8,15 on Cycle 1, then obinutuzumab 1,000 mg IV on day 1 for sequent cycles, every 28 days.     07/16/2019 -  Chemotherapy    OP RCD CLL ITP  riTUXimab IV 375 mg/m2 on day 1, cyclophosphamide 750 mg/m2 IV on day 1, vinCRIStine 1.4 mg/m2 IV on day 1, predniSONE 100 mg PO on days 1-5, every 21 days.              OTHER PAST MEDICAL HISTORY:     Patient Active Problem List   Diagnosis   ??? CLL (chronic lymphocytic leukemia) (CMS-HCC)   ??? Chronic migraine without aura   ??? 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 Left bundle branch block (LBBB)   ??? Hypercholesterolemia   ??? Chest pain, unspecified   ??? GERD (gastroesophageal reflux disease) ??? Osteoporosis   ??? Needs flu shot   ??? BRBPR (bright red blood per rectum)   ??? Other autoimmune hemolytic anemia   ??? B-cell chronic lymphocytic leukemia (CMS-HCC)   ??? Aortic atherosclerosis (CMS-HCC)   ??? Diverticulitis of colon   ??? Epigastric pain   ??? Gastritis, Helicobacter pylori   ??? H/O adenomatous polyp of colon   ??? Healthcare maintenance   ??? Lymphadenopathy   ??? Migraine without aura and without status migrainosus, not intractable   ??? Syncope and collapse   ??? White matter disease   ??? Vasovagal syncope   ??? Chemotherapy-induced neutropenia (CMS-HCC)   ??? Coccyx pain   ??? Chemotherapy induced diarrhea   ??? Chronic right shoulder pain   ??? Chemotherapy-induced thrombocytopenia   ??? Thrombocytopenia (CMS-HCC)   ??? Dyspnea on exertion   ??? Idiopathic thrombocytopenic purpura (ITP) (CMS-HCC)   ??? Oral thrush   ??? Febrile nonhemolytic transfusion reaction   ??? Transfusion reaction   ??? Exertional chest pain   ??? Transaminitis       ALLERGIES:     Baclofen    MEDICATIONS:       Outpatient Encounter Medications as of 08/20/2019   Medication Sig Dispense Refill   ??? amLODIPine (NORVASC) 5 MG tablet Take 1 tablet (5 mg total) by mouth  daily. 30 tablet 2   ??? [EXPIRED] butalbital-acetaminophen-caff (FIORICET) 50-300-40 mg cap Take 1 capsule by mouth every four (4) hours as needed for headache for up to 7 days. 42 capsule 0   ??? entecavir (BARACLUDE) 0.5 MG tablet Take 1 tablet (0.5 mg total) by mouth daily. 30 tablet 11   ??? fluticasone propion-salmeterol (ADVAIR) 100-50 mcg/dose diskus Inhale 2 puffs daily.      ??? loperamide (IMODIUM A-D) 2 mg tablet Take 2 mg by mouth Three (3) times a day as needed for diarrhea. (Patient not taking: Reported on 08/15/2019)     ??? LORazepam (ATIVAN) 0.5 MG tablet Take 1 tablet (0.5 mg total) by mouth nightly as needed for anxiety. 30 tablet 1   ??? metoprolol succinate (TOPROL-XL) 25 MG 24 hr tablet Take 1 tablet (25 mg total) by mouth daily. 30 tablet 3   ??? mirtazapine (REMERON) 7.5 MG tablet Take 7.5 mg by mouth nightly.     ??? multivitamin (TAB-A-VITE/THERAGRAN) per tablet Take 1 tablet by mouth daily.      ??? mv-mn-[id number] (AIRBORNE, ASCORBATE SODIUM,) 333-1.7 mg Chew Chew 2 tablets Three (3) times a day as needed (cold prevention).     ??? pantoprazole (PROTONIX) 40 MG tablet Take by mouth daily.      ??? prochlorperazine (COMPAZINE) 10 MG tablet Take 1 tablet (10 mg total) by mouth every six (6) hours as needed (nausea). 30 tablet 2   ??? sertraline (ZOLOFT) 50 MG tablet Take 50 mg by mouth daily.  1   ??? tiZANidine (ZANAFLEX) 2 MG tablet Take 1 tablet (2 mg total) by mouth nightly as needed. (Patient not taking: Reported on 08/20/2019) 30 tablet 0   ??? [DISCONTINUED] dexAMETHasone (DECADRON) 2 MG tablet Take 3 tablets (6 mg total) by mouth daily for 4 days. 12 tablet 0   ??? [DISCONTINUED] nystatin (MYCOSTATIN) 100,000 unit/mL suspension Take 5 mL (500,000 Units total) by mouth Four (4) times a day. 60 mL 0     Facility-Administered Encounter Medications as of 08/20/2019   Medication Dose Route Frequency Provider Last Rate Last Admin   ??? tbo-filgrastim (GRANIX) injection 480 mcg  480 mcg Subcutaneous Once Vernie Murders, AGNP       ??? [DISCONTINUED] heparin, porcine (PF) 100 unit/mL injection 500 Units  500 Units Intravenous Q30 Min PRN Pernell Dupre, MD   500 Units at 08/20/19 1020       Physical Exam:  There were no vitals filed for this visit.    General: Well-appearing older female in no acute distress, accompanied by son  HEENT:  Clear sclera, conjunctiva, mask in place  LYMPH:  no palpable cervical, supraclavicular, axillary, or inguinal nodes  CARDAC: RRR, no M/R/Gs, trace peripheral edema  RESP: nonlabored, bilaterally CTA  GI: Soft, nontender, active bowel sounds, no hepatosplenomegaly  NEURO: A&Ox4, steady gait, no focal deficits  PSYCH: appropriate  DERM: no visible rashes, lesions.   LINE: PORT    Labs:    Results for orders placed or performed in visit on 08/20/19   Type and Screen   Result Value Ref Range    ABO Grouping B NEG     Antibody Screen NEG    CBC w/ Differential   Result Value Ref Range    WBC 1.9 (L) 4.5 - 11.0 10*9/L    RBC 4.27 4.00 - 5.20 10*12/L    HGB 11.6 (L) 12.0 - 16.0 g/dL    HCT 81.1 (L) 91.4 - 46.0 %  MCV 84.0 80.0 - 100.0 fL    MCH 27.1 26.0 - 34.0 pg    MCHC 32.2 31.0 - 37.0 g/dL    RDW 16.1 (H) 09.6 - 15.0 %    MPV 8.6 7.0 - 10.0 fL    Platelet 84 (L) 150 - 440 10*9/L    Variable HGB Concentration Slight (A) Not Present    Neutrophils % 64.3 %    Lymphocytes % 25.9 %    Monocytes % 5.6 %    Eosinophils % 0.7 %    Basophils % 0.4 %    Absolute Neutrophils 1.2 (L) 2.0 - 7.5 10*9/L    Absolute Lymphocytes 0.5 (L) 1.5 - 5.0 10*9/L    Absolute Monocytes 0.1 (L) 0.2 - 0.8 10*9/L    Absolute Eosinophils 0.0 0.0 - 0.4 10*9/L    Absolute Basophils 0.0 0.0 - 0.1 10*9/L    Large Unstained Cells 3 0 - 4 %    Microcytosis Slight (A) Not Present    Anisocytosis Slight (A) Not Present    Hypochromasia Moderate (A) Not Present

## 2019-08-20 NOTE — Unmapped (Signed)
Katrina Gould is a 79 y.o. 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    Encounter Date: 08/20/2019    Current Treatment: Cycle 2 Day 8 = 08/20/19 RCD (rituximab, cyclophosphamide, dexamethasone)  Dexamethasone dosing for this cycle was as follows:  Dexamethasone 12 mg IV x1 on Day 1  Dexamethasone 12 mg PO Days 2-3  Dexamethasone 6 mg PO Days 4-7 (dose reduced for acute on chronic headache exacerbation)    Interval History:   Katrina Gould presents to clinic today with her son feeling well overall. Last Wednesday she complained of a persistent, severe headache. In the setting of elevated blood pressure and low platelets, she had a head CT on 4/14 that was negative for acute intracranial abnormalities and was prescribed a trial of fioricet to take as needed for the headache. She returned two days later for ongoing headache and her dexamethasone was dose reduced by 50% for the remaining 4 days. Today, she reports that the headache she had for the past 2 weeks is resolving and she is feeling much better. She said she is taking less Fioricet than she was initially, and reports she is currently needing to take 2-3 tablets/day.  She does not note any other differences in symptoms following the dexamethasone dose reduction. She is aware that she should not be taking any more dexamethasone until next cycle and reports that the extra tablets she had from this cycle have been discarded. She reports that she fell last week from feeling drowsy/dizzy/drunk after taking her medications. Today she said she has stopped taking her tizanidine as needed because of this. She started amlodipine last week for hypertension and her blood pressure is improved today at 140/69. She reports intermittently feeling like her heart was racing while she was on the steroids, her heart rate today is 68. She said she has backed off her hydration because she has had frequent urination and sometimes doesn't even notice until later that she has gone. I recommended she follow-up with her PCP about this as well as her headaches given she has a recurrent history of these. She denies any N/V, fevers, or diarrhea. She reports she has not had a bowel movement in the past 3-4 days.    Oncologic History:  Oncology History Overview Note   CLL    First seen in oncology clinic locally 03/31/16    Had a left axillary biopsy 03/15/16 that confirmed CLL/SLL: Flow showed monoclonal B-cell population with CD5, CD23, kappa light chain. IHC showed positive CD20 (diffuse), negative for cyclin D1.    No constitutional symptoms except some sweats    Labs: Hb 12.0, Hct 34.8, plt 147. WBC was 10.6 with ANC 2.5. ALC was 7.5K.    CLL FISH studies: normal panel. No cells with 11q, tri 12, 17p or 13q, and the 11;14 translocation also not detected.    Karyotype: 46,XX,der(4)t(4;4)(p14;q21)[3]/45,XX,dic(6;17)(q12;p11.2) (no loss of TP53 on FISH)    Molecular: No TP53 mutation    CLL-IPI: As per Lancet Oncol Vol 15 October 2014  Includes (1) TP53 status (no abnormalities vs. Del 17p and/or TP53 mutation - 4 points)=0, (2) IGHV mutation status (mut vs unmut - 2 points)=2 (3) serum B2-microglobulin (</=3.5 vs >3.5 - 2 points)=2 (4) clinical stage (Rai 0 vs Rai I-IV - 1 point)=1 and (5) age (</=65 vs >65 - 1 point)=1    Total: 6 points    Low risk (0-1 points)=  93.2% OS at 5 years  Int risk (2-3 points)= 79.3%  High risk (4-6 points)= 63.3%    Very high risk (7-10 points)= 23.3%     Received rituximab weekly x4 from 09/14/17 to 10/05/17  - For ITP 2/t CLL vs. CLL  - No response    Started ven/obi regimen on 10/23/17  Baseline counts:  WBC 45.9  ALC 2.6  Hb 12.5  Plt 10    03/22/18: BM bx without overt CLL, MRD testing at [facility] revealed CD5-positive, kappa light chain-restricted B-cell  population is detected. MRD = 0.32%.    05/15/18: Repeated BM bx given persistent low counts - ~1% CLL in otherwise normal appearing marrow. Myeloid mutation panel negative. Karyotype: 45,XX,dic(6;17)(q12;p11.2)[2]/46,XX[18].       CLL (chronic lymphocytic leukemia) (CMS-HCC)   03/15/2016 Initial Diagnosis    CLL (chronic lymphocytic leukemia) (RAF-HCC)     09/14/2017 - 10/05/2017 Chemotherapy    Rituximab weekly x4  No response     09/14/2017 - 10/05/2017 Chemotherapy    Rituximab x 4     10/23/2017 -  Chemotherapy    Began ven/obi     03/19/2018 Adverse Reaction    Obinutuzumab held given persistent thrombocytopenia. Marrow performed on 03/22/18, patient with no overt CLL (0.32% by MRD flow testing from [facility]). Do not plan to give 6th cycle of obi (unless counts recover in a reasonable timeframe).     03/22/2018 Biopsy    Bone marrow, right iliac, aspiration and biopsy  -  Normocellular bone marrow (30%) with erythroid-predominant trilineage hematopoiesis   -  Suspicious for low level (<5%) residual involvement by chronic lymphocytic leukemia in a background of reactive lymphoid aggregates  -  Routine cytogenetic results reveal a normal karyotype; FISH (CLL panel) results are normal in a limited analysis; see details below (case [id number]).  -  Flow cytometric CLL MRD results reveal a CD5-positive, kappa light chain-restricted B-cell population is detected; MRD = 0.32%; see details in the Referral Testing Report in the Media Tab.     04/09/2018 Adverse Reaction    ANC down to 0.2. Held venetoclax (was previously at 200 mg daily).     05/15/2018 Biopsy    Bone marrow, right iliac, aspiration and biopsy  -  Mildly hypercellular bone marrow (40%) with trilineage hematopoiesis and persistent low level (less than 1% of cells) involvement by chronic lymphocytic leukemia by flow cytometric analysis  -  Numerous reactive lymphoid aggregates  -  No significant morphologic dyspoiesis or increase in blasts  -  Routine cytogenetic results reveal an abnormal karyotype; FISH (CLL panel) results are normal; see details below (case [id number]).  -  Myeloid mutation panel results reveal no mutations were identified in the 34 genes analyzed; see details below (case [id number]).     03/26/2019 Biopsy    Bone marrow, right iliac, aspiration and biopsy  -  Slightly hypercellular bone marrow (40%) with persistent low-level involvement by patient's known chronic lymphocytic leukemia, representing approximately 5% of marrow cells by PAX5 immunohistochemical stain.  -  Increased megakaryocytes identified on bone marrow aspirate smears (see Comment)  -  No significant morphologic dyspoiesis or increase in blasts.  -  Routine cytogenetic analysis is pending.  -  Myeloid mutation panel is pending.    Although not a specific finding, increased megakaryocytes can be seen in the setting of immune-mediated thrombocytopenia (ITP).     B-cell chronic lymphocytic leukemia (CMS-HCC)   09/05/2017 - 10/11/2017 Chemotherapy    Chemotherapy Treatment  Treatment Goal Control   Line of Treatment [No plan line of treatment]   Plan Name OP RITUXIMAB    Start Date 09/14/2017   End Date 10/05/2017   Provider Pernell Dupre, MD   Chemotherapy riTUXimab (RITUXAN) 712.5 mg in sodium chloride (NS) 0.9 % 500 mL IVPB, 375 mg/m2 = 712.5 mg, Intravenous, Once, 1 of 1 cycle  Administration: 712.5 mg (09/14/2017)  riTUXimab (RITUXAN) 712.5 mg in sodium chloride (NS) 0.9 % 250 mL rapid infusion, 375 mg/m2 = 712.5 mg, Intravenous, Once, 1 of 1 cycle  Administration: 712.5 mg (09/21/2017), 712.5 mg (09/28/2017), 712.5 mg (10/05/2017)        10/16/2017 Initial Diagnosis    Chronic lymphocytic leukemia, Rai stage IV (CMS-HCC)     10/23/2017 - 03/18/2018 Chemotherapy    OP OBINUTUZUMAB AND VENETOCLAX  chlorambucil 0.5 mg/kg PO on days 1, 15, obinutuzumab 100 mg IV on day 1, obinutuzumab 900 mg IV on day 2, obinutuzumab 1,000 mg IV on days 8,15 on Cycle 1, then obinutuzumab 1,000 mg IV on day 1 for sequent cycles, every 28 days.     04/07/2019 - 06/30/2019 Chemotherapy    OP CLL OBINUTUZUMAB  chlorambucil 0.5 mg/kg PO on days 1, 15, obinutuzumab 100 mg IV on day 1, obinutuzumab 900 mg IV on day 2, obinutuzumab 1,000 mg IV on days 8,15 on Cycle 1, then obinutuzumab 1,000 mg IV on day 1 for sequent cycles, every 28 days.     07/16/2019 -  Chemotherapy    OP RCD CLL ITP  riTUXimab IV 375 mg/m2 on day 1, cyclophosphamide 750 mg/m2 IV on day 1, vinCRIStine 1.4 mg/m2 IV on day 1, predniSONE 100 mg PO on days 1-5, every 21 days.         Weight and Vitals:  Wt Readings from Last 3 Encounters:   08/20/19 90 kg (198 lb 8 oz)   08/20/19 89.7 kg (197 lb 12 oz)   08/15/19 89.4 kg (197 lb 3.2 oz)     Temp Readings from Last 3 Encounters:   08/20/19 36.6 ??C (97.8 ??F) (Oral)   08/15/19 36.4 ??C (97.5 ??F) (Oral)   08/13/19 37.1 ??C (98.8 ??F) (Oral)     BP Readings from Last 3 Encounters:   08/20/19 140/69   08/15/19 131/73   08/13/19 165/77     Pulse Readings from Last 3 Encounters:   08/20/19 68   08/15/19 71   08/13/19 85       Pertinent Labs:  Lab on 08/20/2019   Component Date Value Ref Range Status   ??? ABO Grouping 08/20/2019 B NEG   Final   ??? Antibody Screen 08/20/2019 NEG   Final   ??? WBC 08/20/2019 1.9* 4.5 - 11.0 10*9/L Final   ??? RBC 08/20/2019 4.27  4.00 - 5.20 10*12/L Final   ??? HGB 08/20/2019 11.6* 12.0 - 16.0 g/dL Final   ??? HCT 16/01/9603 35.9* 36.0 - 46.0 % Final   ??? MCV 08/20/2019 84.0  80.0 - 100.0 fL Final   ??? MCH 08/20/2019 27.1  26.0 - 34.0 pg Final   ??? MCHC 08/20/2019 32.2  31.0 - 37.0 g/dL Final   ??? RDW 54/12/8117 17.4* 12.0 - 15.0 % Final   ??? MPV 08/20/2019 8.6  7.0 - 10.0 fL Final   ??? Platelet 08/20/2019 84* 150 - 440 10*9/L Final   ??? Variable HGB Concentration 08/20/2019 Slight* Not Present Final   ??? Neutrophils % 08/20/2019 64.3  % Final   ??? Lymphocytes %  08/20/2019 25.9  % Final   ??? Monocytes % 08/20/2019 5.6  % Final   ??? Eosinophils % 08/20/2019 0.7  % Final   ??? Basophils % 08/20/2019 0.4  % Final   ??? Absolute Neutrophils 08/20/2019 1.2* 2.0 - 7.5 10*9/L Final   ??? Absolute Lymphocytes 08/20/2019 0.5* 1.5 - 5.0 10*9/L Final   ??? Absolute Monocytes 08/20/2019 0.1* 0.2 - 0.8 10*9/L Final   ??? Absolute Eosinophils 08/20/2019 0.0  0.0 - 0.4 10*9/L Final   ??? Absolute Basophils 08/20/2019 0.0  0.0 - 0.1 10*9/L Final   ??? Large Unstained Cells 08/20/2019 3  0 - 4 % Final   ??? Microcytosis 08/20/2019 Slight* Not Present Final   ??? Anisocytosis 08/20/2019 Slight* Not Present Final   ??? Hypochromasia 08/20/2019 Moderate* Not Present Final       Allergies:   Allergies   Allergen Reactions   ??? Baclofen      made me go crazy       Current Medications:  Current Outpatient Medications   Medication Sig Dispense Refill   ??? amLODIPine (NORVASC) 5 MG tablet Take 1 tablet (5 mg total) by mouth daily. 30 tablet 2   ??? butalbital-acetaminophen-caff (FIORICET) 50-300-40 mg cap Take 1 capsule by mouth every four (4) hours as needed for headache for up to 7 days. 42 capsule 0   ??? entecavir (BARACLUDE) 0.5 MG tablet Take 1 tablet (0.5 mg total) by mouth daily. 30 tablet 11   ??? fluticasone propion-salmeterol (ADVAIR) 100-50 mcg/dose diskus Inhale 2 puffs daily.      ??? LORazepam (ATIVAN) 0.5 MG tablet Take 1 tablet (0.5 mg total) by mouth nightly as needed for anxiety. 30 tablet 1   ??? metoprolol succinate (TOPROL-XL) 25 MG 24 hr tablet Take 1 tablet (25 mg total) by mouth daily. 30 tablet 3   ??? mirtazapine (REMERON) 7.5 MG tablet Take 7.5 mg by mouth nightly.     ??? multivitamin (TAB-A-VITE/THERAGRAN) per tablet Take 1 tablet by mouth daily.      ??? mv-mn-vitC-asbNa-Glu-Lys-hc124 (AIRBORNE, ASCORBATE SODIUM,) 333-1.7 mg Chew Chew 2 tablets Three (3) times a day as needed (cold prevention).     ??? pantoprazole (PROTONIX) 40 MG tablet Take by mouth daily.      ??? sertraline (ZOLOFT) 50 MG tablet Take 50 mg by mouth daily.  1   ??? loperamide (IMODIUM A-D) 2 mg tablet Take 2 mg by mouth Three (3) times a day as needed for diarrhea. (Patient not taking: Reported on 08/15/2019)     ??? prochlorperazine (COMPAZINE) 10 MG tablet Take 1 tablet (10 mg total) by mouth every six (6) hours as needed (nausea). 30 tablet 2   ??? tiZANidine (ZANAFLEX) 2 MG tablet Take 1 tablet (2 mg total) by mouth nightly as needed. (Patient not taking: Reported on 08/20/2019) 30 tablet 0     No current facility-administered medications for this visit.     Facility-Administered Medications Ordered in Other Visits   Medication Dose Route Frequency Provider Last Rate Last Admin   ??? heparin, porcine (PF) 100 unit/mL injection 500 Units  500 Units Intravenous Q30 Min PRN Pernell Dupre, MD   500 Units at 08/20/19 1020   ??? tbo-filgrastim (GRANIX) injection 480 mcg  480 mcg Subcutaneous Once Vernie Murders, AGNP           Assessment: Katrina Gould is a 79 y.o. 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 CLL/CLL-related ITP: today is cycle 2 Day 8 of RCD  - Cycle 2 Day 1 RCD = 08/13/19  - Dexamethasone dosing for this cycle was as follows:   Dexamethasone 12 mg IV x1 on Day 1   Dexamethasone 12 mg PO Days 2-3   Dexamethasone 6 mg PO Days 4-7 (dose reduced for acute on chronic headache  exacerbation)    2. Hypertension  - Continue amlodipine (started 4/14), BP 140/69 today, improved  - Continue metoprolol    3. Headache  - Continue fioricet PRN  - CT head negative on 4/14  - Dexamethasone dose reduced this cycle for acute on chronic headache exacerbation    4. Constipation  - Start senna daily; if needed, add miralax once a day    5. Medication management  - Requested patient bring in all of the medications she is currently taking with her when she starts next cycle for review to ensure we have an accurate list  - Recommended she continue to avoid using tizanidine for now given her recent dizziness and fall    F/u:  Future Appointments   Date Time Provider Department Center   08/27/2019 10:00 AM ONCINF [facility] CHAIR 08 [facility]   09/03/2019 10:00 AM ONCINF [facility] CHAIR 11 [facility]INF [facility]   09/10/2019  9:00 AM ADULT ONC LAB [facility]   09/10/2019 10:00 AM Sean Marrian Salvage, AGNP [id number]   09/10/2019 11:00 AM ONCINF CHAIR 48 [id number]       I spent 15 minutes with Katrina Gould in direct patient care.    Dallas Schimke, PharmD, CPP  BMT/Leukemia Clinical Pharmacist Practitioner

## 2019-08-20 NOTE — Unmapped (Signed)
Use Senna daily - with lots of fluids  IF NEEDED - add miralax once a day.

## 2019-08-21 NOTE — Unmapped (Signed)
Contacted Katrina Gould and l/m advising that per LLS, her grant was approved 06/25/19 and that pharmacy benefits are being used successfully. Advise to return call if any further clarification is needed.

## 2019-08-21 NOTE — Unmapped (Signed)
Patient Katrina Gould was contacted today regarding scheduled appt. for 09/03/2019 Voicemail was left for patient with information and to call back with any questions or concerns.Marland Kitchen

## 2019-08-27 ENCOUNTER — Ambulatory Visit: Admit: 2019-08-27 | Discharge: 2019-08-27 | Payer: MEDICARE

## 2019-08-27 DIAGNOSIS — C9110 Chronic lymphocytic leukemia of B-cell type not having achieved remission: Principal | ICD-10-CM

## 2019-08-27 LAB — CBC W/ AUTO DIFF
BASOPHILS ABSOLUTE COUNT: 0 10*9/L (ref 0.0–0.1)
BASOPHILS RELATIVE PERCENT: 1.4 %
EOSINOPHILS ABSOLUTE COUNT: 0 10*9/L (ref 0.0–0.7)
EOSINOPHILS RELATIVE PERCENT: 2 %
HEMATOCRIT: 33.2 % — ABNORMAL LOW (ref 35.0–44.0)
HEMOGLOBIN: 11.2 g/dL — ABNORMAL LOW (ref 12.0–15.5)
LYMPHOCYTES ABSOLUTE COUNT: 0.4 10*9/L — ABNORMAL LOW (ref 0.7–4.0)
MEAN CORPUSCULAR HEMOGLOBIN CONC: 33.6 g/dL (ref 30.0–36.0)
MEAN CORPUSCULAR HEMOGLOBIN: 27 pg (ref 26.0–34.0)
MEAN CORPUSCULAR VOLUME: 80.3 fL — ABNORMAL LOW (ref 82.0–98.0)
MEAN PLATELET VOLUME: 8.1 fL (ref 7.0–10.0)
MONOCYTES RELATIVE PERCENT: 28 %
NEUTROPHILS RELATIVE PERCENT: 16.2 %
NUCLEATED RED BLOOD CELLS: 0 /100{WBCs} (ref <=4)
PLATELET COUNT: 59 10*9/L — ABNORMAL LOW (ref 150–450)
RED BLOOD CELL COUNT: 4.14 10*12/L (ref 3.90–5.03)
RED CELL DISTRIBUTION WIDTH: 21 % — ABNORMAL HIGH (ref 12.0–15.0)
WBC ADJUSTED: 0.8 10*9/L — CL (ref 3.5–10.5)

## 2019-08-27 LAB — HEMATOCRIT: Hematocrit:VFr:Pt:Bld:Qn:: 33.2 — ABNORMAL LOW

## 2019-08-27 LAB — SMEAR REVIEW

## 2019-08-27 NOTE — Unmapped (Signed)
No indication for transfusion today, Port flushed, hep locked and deaccessed, AVS declined, DC under self care

## 2019-08-29 DIAGNOSIS — D701 Agranulocytosis secondary to cancer chemotherapy: Principal | ICD-10-CM

## 2019-08-29 DIAGNOSIS — T451X5A Adverse effect of antineoplastic and immunosuppressive drugs, initial encounter: Principal | ICD-10-CM

## 2019-08-29 MED ORDER — LEVOFLOXACIN 500 MG TABLET
ORAL_TABLET | Freq: Every day | ORAL | 0 refills | 30.00000 days | Status: CP
Start: 2019-08-29 — End: 2019-09-28

## 2019-08-29 NOTE — Unmapped (Signed)
Contacted pt per Langley Gauss and l/m advising that Rx is being called in to her [facility] for levofloxacin. Gregary Signs would like Ms. Roarty to take levofloxacin preventively for a few weeks while neutrophil count is low. Advised Ms. Glogowski to call Nurse Triage with any questions or for clarification.

## 2019-09-03 ENCOUNTER — Ambulatory Visit: Admit: 2019-09-03 | Discharge: 2019-09-04 | Payer: MEDICARE

## 2019-09-03 DIAGNOSIS — C9110 Chronic lymphocytic leukemia of B-cell type not having achieved remission: Principal | ICD-10-CM

## 2019-09-03 LAB — CBC W/ AUTO DIFF
BASOPHILS ABSOLUTE COUNT: 0 10*9/L (ref 0.0–0.1)
BASOPHILS RELATIVE PERCENT: 0.8 %
EOSINOPHILS ABSOLUTE COUNT: 0.1 10*9/L (ref 0.0–0.7)
EOSINOPHILS RELATIVE PERCENT: 2.4 %
HEMATOCRIT: 33.4 % — ABNORMAL LOW (ref 35.0–44.0)
HEMOGLOBIN: 11.3 g/dL — ABNORMAL LOW (ref 12.0–15.5)
LYMPHOCYTES ABSOLUTE COUNT: 0.5 10*9/L — ABNORMAL LOW (ref 0.7–4.0)
LYMPHOCYTES RELATIVE PERCENT: 22.4 %
MEAN CORPUSCULAR HEMOGLOBIN CONC: 33.9 g/dL (ref 30.0–36.0)
MEAN CORPUSCULAR HEMOGLOBIN: 27.7 pg (ref 26.0–34.0)
MEAN CORPUSCULAR VOLUME: 81.7 fL — ABNORMAL LOW (ref 82.0–98.0)
MEAN PLATELET VOLUME: 9.1 fL (ref 7.0–10.0)
MONOCYTES ABSOLUTE COUNT: 0.3 10*9/L (ref 0.1–1.0)
MONOCYTES RELATIVE PERCENT: 11.1 %
NEUTROPHILS ABSOLUTE COUNT: 1.5 10*9/L — ABNORMAL LOW (ref 1.7–7.7)
NEUTROPHILS RELATIVE PERCENT: 63.3 %
NUCLEATED RED BLOOD CELLS: 0 /100{WBCs} (ref <=4)
PLATELET COUNT: 49 10*9/L — ABNORMAL LOW (ref 150–450)
RED CELL DISTRIBUTION WIDTH: 21.6 % — ABNORMAL HIGH (ref 12.0–15.0)

## 2019-09-03 LAB — EOSINOPHILS ABSOLUTE COUNT: Eosinophils:NCnc:Pt:Bld:Qn:Automated count: 0.1

## 2019-09-03 NOTE — Unmapped (Signed)
VS taken per protocol. Labs drawn via [place].  Port flushed and brisk blood return noted.    Pt tolerated well.  Results reviewed - WNL.  Interventions - no intervention needed.     AVS given.  Pt stable and ambulated independently from clinic.  No concerns voiced.  VWilliams,RN.

## 2019-09-03 NOTE — Unmapped (Signed)
Infusion on 09/03/2019   Component Date Value Ref Range Status   ??? WBC 09/03/2019 2.4* 3.5 - 10.5 10*9/L Final   ??? RBC 09/03/2019 4.09  3.90 - 5.03 10*12/L Final   ??? HGB 09/03/2019 11.3* 12.0 - 15.5 g/dL Final   ??? HCT 16/01/9603 33.4* 35.0 - 44.0 % Final   ??? MCV 09/03/2019 81.7* 82.0 - 98.0 fL Final   ??? MCH 09/03/2019 27.7  26.0 - 34.0 pg Final   ??? MCHC 09/03/2019 33.9  30.0 - 36.0 g/dL Final   ??? RDW 54/12/8117 21.6* 12.0 - 15.0 % Final   ??? MPV 09/03/2019 9.1  7.0 - 10.0 fL Final   ??? Platelet 09/03/2019 49* 150 - 450 10*9/L Final   ??? nRBC 09/03/2019 0  <=4 /100 WBCs Final   ??? Neutrophils % 09/03/2019 63.3  % Final   ??? Lymphocytes % 09/03/2019 22.4  % Final   ??? Monocytes % 09/03/2019 11.1  % Final   ??? Eosinophils % 09/03/2019 2.4  % Final   ??? Basophils % 09/03/2019 0.8  % Final   ??? Absolute Neutrophils 09/03/2019 1.5* 1.7 - 7.7 10*9/L Final   ??? Absolute Lymphocytes 09/03/2019 0.5* 0.7 - 4.0 10*9/L Final   ??? Absolute Monocytes 09/03/2019 0.3  0.1 - 1.0 10*9/L Final   ??? Absolute Eosinophils 09/03/2019 0.1  0.0 - 0.7 10*9/L Final   ??? Absolute Basophils 09/03/2019 0.0  0.0 - 0.1 10*9/L Final   ??? Anisocytosis 09/03/2019 Marked* Not Present Final

## 2019-09-05 NOTE — Unmapped (Signed)
[facility]ukemia [facility] Visit    Patient Name: Katrina Gould  Patient Age: 79 y.o.  Encounter Date: 09/10/2019      Primary Care Provider:  Lauro Regulus, MD    Referring Physician:  Lauro Regulus, MD    REASON FOR VISIT:   79 y.o. female here to follow up for her CLL.    ASSESSMENT/PLAN     Katrina Gould is a 79 y.o. female who presents for follow of her CLL/SLL, Rai stage 4. Disease markers: 46,XX,der(4)t(4;4)(p14;q21). IGHV unmutated (0%, 3-33*01). B75m 5.05. No TP53 mutation. High-risk by CLL-IPI (6 points).    Platelet count downtrended despite the eltrombopag. BM bx 03/26/19 most consistent with ITP and CLL. After discussion with patient/son, stopped eltrombopag and started obinutuzumab 04/2019. Patient has now received 5 doses without improvement in platelets.     Given refractory nature of CLL-related ITP, initiated RCD. In a small trial of RCD conducted in 8 CLL patients with steroid refractory AIHA, all 8 patients achieved remission Chales Abrahams et al, Leukemia 2002). While the patient has not yet been treated with a BTK inhibitor for CLL-directed treatment, given that the patient's thrombocytopenia is her major problem with a controlled lymphocyte count and very little marrow involvement by her CLL, we recommend RCD q28 days Chales Abrahams et al, Leukemia 2002) rather than BTK inhibitor given its effectiveness specifically for CLL-related autoimmune cytopenias. If no response to RCD, can trial BTK inhibitor.      Cycle 2, particularly week 1, was complicated by severe headaches and drunk feeling. Dexamethasone was dose-reduced for final 4 doses to 6mg .    Her platelets have slightly improved over the 2 cycles. She is slightly more anemic and ANC nadired for 0.1 for one week. LFTs are increased further. Her headaches resolved within the first week. The drunken feeling has persisted. The same symptoms have been evaluated in the past, per patient, and were found to be stress. Overall the patient feels that she is improved from a month ago.    We discussed the platelet trend and also her toxicities. We discussed dose-reducing with the goal of continued platelet improvement but with less toxicity and the patient is agreeable with proceeding.    We will plan a 50% dose-reduction of steroids and 33% dose-reduction of cyclophosphamide.    Liver ultrasound is scheduled for next week.      Plans and Recommendations:    1. CLL with CLL-related ITP  - C3D1 RCD today   -- rituximab 375 mg/m2 on D1  -- cyclophosphamide  mg/m2 on D1 (dose-reduced to 500 mg/m2)  -- dexamethasone  mg on D1-7 (dose-reduced to 6mg )  - plan for cycles q4 weeks until best platelet response, plan for no more than 6 cycles, hopefully less  - compazine, ativan prn for nausea  - weekly labs/transfusion support as needed    2. 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 Hx Platelet transfusion reaction: Patient has had febrile non-hemolytic transfusion reaction x 2. Also had chest pain during reaction possibly related to underlying CAD.  -- infuse platelets more slowly per Transfusion Medicine  -- follows with Cardiology  -- hold ASA while platelets < 50K  -- low threshold to reduce dexamethasone dose  -- monitor sxs    4. 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 Given risk of Hep B reactivation with RTX, on entecavir.   -- continue entecavir 0.5 mg daily. Confirmed compliance with medication.   - monitor HBV DNA PCR monthly  - follow LFTs closely  -- Continue x 1 year s/p RTX    5. Transaminitis: AST 70, ALT 78 today. Alk phos 304. Has been present  since 06/24/19. And has mildly worsened since that time. Bilirubin normal.   --hold statin  - liver ultrasound scheduled for next week.    6. Chest pain: Has had past episodes of chest pain, particularly with platelet transfusions. Recent TTE with EF > 55%. Cardiac cath not performed 2/2 thrombocytopenia but stress test recommended when platelets improve.   -- Cardiology follow up.     7. DOE and occasional rapid heart rate  - stable and chronic    8. Arthritis  - continues to use topical CBD oil and this has helped  - May continue prn tylenol but should avoid NSAIDs  - knows to avoid oral CBD oil    9 Constipation  - senna daily and PRN miralax  ??  10. Health Maintenance:   - rec annual flu vaccine  - recommend COVID-19 vaccine, but will need platelets consistently 50K or higher. We will defer until platelets more stable.     I personally spent 65 minutes face-to-face and non-face-to-face in the care of this patient, which includes all pre, intra, and post visit time on the date of service.      Langley Gauss, AGPCNP-BC  Nurse Practitioner  Hematology/Oncology  [facility]    Drs. Sheets/Foster, covering for Dr. Lonni Fix, were available.      INTERVAL HISTORY     Patient accompanied by her son today.     She reports that her fatigue continues. She does chores but needs to take frequent breaks. She has not experienced any further headaches. She still has the drunken feeling. Drunk in my brain, not my body. She has not had any falls since the last visit. Her BMs are controlled. No nausea/vomiting. Some abdominal cramping. She feels that, overall, she is better than she was a month ago.            HEMATOLOGICAL / ONCOLOGICAL HISTORY:     Oncology History Overview Note   CLL    First seen in oncology clinic locally 03/31/16    Had a left axillary biopsy 03/15/16 that confirmed CLL/SLL: Flow showed monoclonal B-cell population with CD5, CD23, kappa light chain. IHC showed positive CD20 (diffuse), negative for cyclin D1.    No constitutional symptoms except some sweats    Labs: Hb 12.0, Hct 34.8, plt 147. WBC was 10.6 with ANC 2.5. ALC was 7.5K.    CLL FISH studies: normal panel. No cells with 11q, tri 12, 17p or 13q, and the 11;14 translocation also not detected. Karyotype: 46,XX,der(4)t(4;4)(p14;q21)[3]/45,XX,dic(6;17)(q12;p11.2) (no loss of TP53 on FISH)    Molecular: No TP53 mutation    CLL-IPI: As per Lancet Oncol Vol 15 October 2014  Includes (1) TP53 status (no abnormalities vs. Del 17p and/or TP53 mutation - 4 points)=0, (2) IGHV mutation status (mut vs unmut - 2 points)=2 (3) serum B2-microglobulin (</=3.5 vs >3.5 - 2 points)=2 (4) clinical stage (Rai 0 vs Rai I-IV - 1 point)=1 and (5) age (</=65 vs >65 - 1 point)=1    Total: 6 points    Low risk (0-1 points)= 93.2% OS at 5 years  Int risk (2-3 points)= 79.3%  High risk (4-6 points)=  63.3%    Very high risk (7-10 points)= 23.3%     Received rituximab weekly x4 from 09/14/17 to 10/05/17  - For ITP 2/t CLL vs. CLL  - No response    Started ven/obi regimen on 10/23/17  Baseline counts:  WBC 45.9  ALC 2.6  Hb 12.5  Plt 10    03/22/18: BM bx without overt CLL, MRD testing at [facility] revealed CD5-positive, kappa light chain-restricted B-cell  population is detected. MRD = 0.32%.    05/15/18: Repeated BM bx given persistent low counts - ~1% CLL in otherwise normal appearing marrow. Myeloid mutation panel negative. Karyotype: 45,XX,dic(6;17)(q12;p11.2)[2]/46,XX[18].       CLL (chronic lymphocytic leukemia) (CMS-HCC)   03/15/2016 Initial Diagnosis    CLL (chronic lymphocytic leukemia) (RAF-HCC)     09/14/2017 - 10/05/2017 Chemotherapy    Rituximab weekly x4  No response     09/14/2017 - 10/05/2017 Chemotherapy    Rituximab x 4     10/23/2017 -  Chemotherapy    Began ven/obi     03/19/2018 Adverse Reaction    Obinutuzumab held given persistent thrombocytopenia. Marrow performed on 03/22/18, patient with no overt CLL (0.32% by MRD flow testing from [facility]). Do not plan to give 6th cycle of obi (unless counts recover in a reasonable timeframe).     03/22/2018 Biopsy    Bone marrow, right iliac, aspiration and biopsy  -  Normocellular bone marrow (30%) with erythroid-predominant trilineage hematopoiesis   -  Suspicious for low level (<5%) residual involvement by chronic lymphocytic leukemia in a background of reactive lymphoid aggregates  -  Routine cytogenetic results reveal a normal karyotype; FISH (CLL panel) results are normal in a limited analysis; see details below (case [id number]).  -  Flow cytometric CLL MRD results reveal a CD5-positive, kappa light chain-restricted B-cell population is detected; MRD = 0.32%; see details in the Referral Testing Report in the Media Tab.     04/09/2018 Adverse Reaction    ANC down to 0.2. Held venetoclax (was previously at 200 mg daily).     05/15/2018 Biopsy    Bone marrow, right iliac, aspiration and biopsy  -  Mildly hypercellular bone marrow (40%) with trilineage hematopoiesis and persistent low level (less than 1% of cells) involvement by chronic lymphocytic leukemia by flow cytometric analysis  -  Numerous reactive lymphoid aggregates  -  No significant morphologic dyspoiesis or increase in blasts  -  Routine cytogenetic results reveal an abnormal karyotype; FISH (CLL panel) results are normal; see details below (case [id number]).  -  Myeloid mutation panel results reveal no mutations were identified in the 34 genes analyzed; see details below (case [id number]).     03/26/2019 Biopsy    Bone marrow, right iliac, aspiration and biopsy  -  Slightly hypercellular bone marrow (40%) with persistent low-level involvement by patient's known chronic lymphocytic leukemia, representing approximately 5% of marrow cells by PAX5 immunohistochemical stain.  -  Increased megakaryocytes identified on bone marrow aspirate smears (see Comment)  -  No significant morphologic dyspoiesis or increase in blasts.  -  Routine cytogenetic analysis is pending.  -  Myeloid mutation panel is pending.    Although not a specific finding, increased megakaryocytes can be seen in the setting of immune-mediated thrombocytopenia (ITP).     08/13/2019 -  Chemotherapy    Cycle 2 RCD      riTUXimab IV 375 mg/m2 on day 1,   cyclophosphamide 750 mg/m2 IV on day 1  Dexamethasone 12 mg  Days 1-7 - dose reduced to 6mg  on day 4 due to headaches, drunken feeling     09/10/2019 -  Chemotherapy    Cycle 3 RCD      riTUXimab IV 375 mg/m2 on day 1,   cyclophosphamide 500 mg/m2 IV on day 1 (33% dose-reduction)  Dexamethasone 6 mg Days 1-7     Dose reductions due to neutropenia (1 week ANC 0.1) and symptoms     B-cell chronic lymphocytic leukemia (CMS-HCC)   09/05/2017 - 10/11/2017 Chemotherapy    Chemotherapy Treatment    Treatment Goal Control   Line of Treatment [No plan line of treatment]   Plan Name OP RITUXIMAB    Start Date 09/14/2017   End Date 10/05/2017   Provider Pernell Dupre, MD   Chemotherapy riTUXimab (RITUXAN) 712.5 mg in sodium chloride (NS) 0.9 % 500 mL IVPB, 375 mg/m2 = 712.5 mg, Intravenous, Once, 1 of 1 cycle  Administration: 712.5 mg (09/14/2017)  riTUXimab (RITUXAN) 712.5 mg in sodium chloride (NS) 0.9 % 250 mL rapid infusion, 375 mg/m2 = 712.5 mg, Intravenous, Once, 1 of 1 cycle  Administration: 712.5 mg (09/21/2017), 712.5 mg (09/28/2017), 712.5 mg (10/05/2017)        10/16/2017 Initial Diagnosis    Chronic lymphocytic leukemia, Rai stage IV (CMS-HCC)     10/23/2017 - 03/18/2018 Chemotherapy    OP OBINUTUZUMAB AND VENETOCLAX  chlorambucil 0.5 mg/kg PO on days 1, 15, obinutuzumab 100 mg IV on day 1, obinutuzumab 900 mg IV on day 2, obinutuzumab 1,000 mg IV on days 8,15 on Cycle 1, then obinutuzumab 1,000 mg IV on day 1 for sequent cycles, every 28 days.     04/07/2019 - 06/30/2019 Chemotherapy    OP CLL OBINUTUZUMAB  chlorambucil 0.5 mg/kg PO on days 1, 15, obinutuzumab 100 mg IV on day 1, obinutuzumab 900 mg IV on day 2, obinutuzumab 1,000 mg IV on days 8,15 on Cycle 1, then obinutuzumab 1,000 mg IV on day 1 for sequent cycles, every 28 days.     07/16/2019 -  Chemotherapy    Cycle 1 RCD     riTUXimab IV 375 mg/m2 on day 1,   cyclophosphamide 750 mg/m2 IV on day 1  Dexamethasone 12 mg Days 1-7         08/13/2019 -  Chemotherapy    Cycle 2 RCD riTUXimab IV 375 mg/m2 on day 1,   cyclophosphamide 750 mg/m2 IV on day 1  Dexamethasone 12 mg Days 1-7 - dose reduced to 6mg  on day 4 due to headaches, drunken feeling     09/10/2019 -  Chemotherapy    Cycle 3 RCD      riTUXimab IV 375 mg/m2 on day 1,   cyclophosphamide 500 mg/m2 IV on day 1 (33% dose-reduction)  Dexamethasone 6 mg Days 1-7     Dose reductions due to neutropenia (1 week ANC 0.1) and symptoms              OTHER PAST MEDICAL HISTORY:     Patient Active Problem List   Diagnosis   ??? CLL (chronic lymphocytic leukemia) (CMS-HCC)   ??? Chronic migraine without aura   ??? 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 Left bundle branch block (LBBB)   ??? Hypercholesterolemia   ??? Chest pain, unspecified   ??? GERD (gastroesophageal reflux disease)   ??? Osteoporosis   ??? Needs flu shot   ??? BRBPR (bright red blood per rectum)   ??? Other autoimmune hemolytic anemia   ???  B-cell chronic lymphocytic leukemia (CMS-HCC)   ??? Aortic atherosclerosis (CMS-HCC)   ??? Diverticulitis of colon   ??? Epigastric pain   ??? Gastritis, Helicobacter pylori   ??? H/O adenomatous polyp of colon   ??? Healthcare maintenance   ??? Lymphadenopathy   ??? Migraine without aura and without status migrainosus, not intractable   ??? Syncope and collapse   ??? White matter disease   ??? Vasovagal syncope   ??? Chemotherapy-induced neutropenia (CMS-HCC)   ??? Coccyx pain   ??? Chemotherapy induced diarrhea   ??? Chronic right shoulder pain   ??? Chemotherapy-induced thrombocytopenia   ??? Thrombocytopenia (CMS-HCC)   ??? Dyspnea on exertion   ??? Idiopathic thrombocytopenic purpura (ITP) (CMS-HCC)   ??? Oral thrush   ??? Febrile nonhemolytic transfusion reaction   ??? Transfusion reaction   ??? Exertional chest pain   ??? Transaminitis       ALLERGIES:     Baclofen    MEDICATIONS:       Outpatient Encounter Medications as of 09/10/2019   Medication Sig Dispense Refill   ??? amLODIPine (NORVASC) 5 MG tablet Take 1 tablet (5 mg total) by mouth daily. 30 tablet 2   ??? entecavir (BARACLUDE) 0.5 MG tablet Take 1 tablet (0.5 mg total) by mouth daily. 30 tablet 11   ??? fluticasone propion-salmeterol (ADVAIR) 100-50 mcg/dose diskus Inhale 2 puffs daily.      ??? loperamide (IMODIUM A-D) 2 mg tablet Take 2 mg by mouth Three (3) times a day as needed for diarrhea.      ??? LORazepam (ATIVAN) 0.5 MG tablet Take 1 tablet (0.5 mg total) by mouth nightly as needed for anxiety. 30 tablet 1   ??? metoprolol succinate (TOPROL-XL) 25 MG 24 hr tablet Take 1 tablet (25 mg total) by mouth daily. 30 tablet 3   ??? mirtazapine (REMERON) 7.5 MG tablet Take 7.5 mg by mouth nightly.     ??? multivitamin (TAB-A-VITE/THERAGRAN) per tablet Take 1 tablet by mouth daily.      ??? mv-mn-vitC-asbNa-Glu-Lys-hc124 (AIRBORNE, ASCORBATE SODIUM,) 333-1.7 mg Chew Chew 2 tablets Three (3) times a day as needed (cold prevention).     ??? pantoprazole (PROTONIX) 40 MG tablet Take by mouth daily.      ??? prochlorperazine (COMPAZINE) 10 MG tablet Take 1 tablet (10 mg total) by mouth every six (6) hours as needed (nausea). 30 tablet 2   ??? sertraline (ZOLOFT) 50 MG tablet Take 50 mg by mouth daily.  1   ??? tiZANidine (ZANAFLEX) 2 MG tablet Take 1 tablet (2 mg total) by mouth nightly as needed. 30 tablet 0   ??? [DISCONTINUED] levoFLOXacin (LEVAQUIN) 500 MG tablet Take 1 tablet (500 mg total) by mouth daily. 30 tablet 0   ??? [EXPIRED] butalbital-acetaminophen-caff (FIORICET) 50-300-40 mg cap Take 1 capsule by mouth every four (4) hours as needed for headache for up to 7 days. 42 capsule 0   ??? dexAMETHasone (DECADRON) 2 MG tablet Take 3 tablets (6 mg total) by mouth daily for 6 days. Start 09/11/19 18 tablet 0     Facility-Administered Encounter Medications as of 09/10/2019   Medication Dose Route Frequency Provider Last Rate Last Admin   ??? tbo-filgrastim (GRANIX) injection 480 mcg  480 mcg Subcutaneous Once Vernie Murders, [place]           Physical Exam:  Vitals:    09/10/19 1008   BP: 143/69   Pulse: 71   Resp: 16   Temp: 36.5 ??C (97.7 ??F)  SpO2: 95% General: Well-appearing older female in no acute distress, accompanied by son  HEENT:  Clear sclera, conjunctiva, mask in place  LYMPH:  no palpable cervical, supraclavicular, axillary, or inguinal nodes  CARDAC: RRR, no M/R/Gs, trace peripheral edema  RESP: nonlabored, bilaterally CTA  GI: Soft, nontender, active bowel sounds, no hepatosplenomegaly  NEURO: A&Ox4, cautious slow gait, no focal deficits, 2 beats lateral nystagmus, fluid cerebellar movements (finger-nose, rapid alternating movements, heel-shin), strength equal bilaterally, negative Romberg, proprioception intact  PSYCH: appropriate  DERM: no visible rashes, lesions.   LINE: PORT    Labs:    Results for orders placed or performed in visit on 09/10/19   Hepatitis B DNA, Quantitative, PCR   Result Value Ref Range    HBV DNA Quant Not Detected Not Detected   Comprehensive metabolic panel   Result Value Ref Range    Sodium 134 (L) 135 - 145 mmol/L    Potassium 3.9 3.5 - 5.0 mmol/L    Chloride 103 98 - 107 mmol/L    Anion Gap 6 (L) 7 - 15 mmol/L    CO2 25.0 22.0 - 30.0 mmol/L    BUN 19 7 - 21 mg/dL    Creatinine 1.61 0.96 - 1.00 mg/dL    BUN/Creatinine Ratio 26     EGFR CKD-EPI Non-African American, Female 77 >=60 mL/min/1.53m2    EGFR CKD-EPI African American, Female 49 >=60 mL/min/1.42m2    Glucose 116 70 - 179 mg/dL    Calcium 8.8 8.5 - 04.5 mg/dL    Albumin 3.7 3.5 - 5.0 g/dL    Total Protein 6.0 (L) 6.5 - 8.3 g/dL    Total Bilirubin 1.1 0.0 - 1.2 mg/dL    AST 94 (H) 14 - 38 U/L    ALT 100 (H) <35 U/L    Alkaline Phosphatase 590 (H) 38 - 126 U/L   Lactate dehydrogenase   Result Value Ref Range    LDH 672 (H) 338 - 610 U/L   CBC w/ Differential   Result Value Ref Range    WBC 2.2 (L) 4.5 - 11.0 10*9/L    RBC 4.20 4.00 - 5.20 10*12/L    HGB 11.8 (L) 12.0 - 16.0 g/dL    HCT 40.9 (L) 81.1 - 46.0 %    MCV 85.0 80.0 - 100.0 fL    MCH 28.2 26.0 - 34.0 pg    MCHC 33.1 31.0 - 37.0 g/dL    RDW 91.4 (H) 78.2 - 15.0 %    MPV 9.8 7.0 - 10.0 fL    Platelet 58 (L) 150 - 440 10*9/L    Variable HGB Concentration Slight (A) Not Present    Neutrophils % 56.8 %    Lymphocytes % 23.9 %    Monocytes % 10.6 %    Eosinophils % 4.6 %    Basophils % 1.1 %    Absolute Neutrophils 1.2 (L) 2.0 - 7.5 10*9/L    Absolute Lymphocytes 0.5 (L) 1.5 - 5.0 10*9/L    Absolute Monocytes 0.2 0.2 - 0.8 10*9/L    Absolute Eosinophils 0.1 0.0 - 0.4 10*9/L    Absolute Basophils 0.0 0.0 - 0.1 10*9/L    Large Unstained Cells 3 0 - 4 %    Microcytosis Slight (A) Not Present    Macrocytosis Slight (A) Not Present    Anisocytosis Moderate (A) Not Present    Hypochromasia Slight (A) Not Present

## 2019-09-08 NOTE — Unmapped (Signed)
The [facility] has made a third and final attempt to reach this patient to refill the following medication:Entecavir 0.5mg .      We have left voicemails on the following phone numbers: [phone] and have left voicemail with Arlys John at the following phone numbers: [phone].    Dates contacted: 5/4,7,10/21  Last scheduled delivery: 08/13/19    The patient may be at risk of non-compliance with this medication. The patient should call the [facility] at [phone] (option 4) to refill medication.    Katrina Gould Katrina Gould   [facility] Specialty Technician

## 2019-09-09 NOTE — Unmapped (Signed)
[facility] Refill Coordination Note    Specialty Medication(s) to be Shipped:   Infectious Disease: entecavir    Other medication(s) to be shipped: none     Katrina Gould, DOB: 03/19/1941  Phone: [phone] (home) [phone] (work)      All above HIPAA information was verified with patient.     Was a Nurse, learning disability used for this call? No    Completed refill call assessment today to schedule patient's medication shipment from the [facility] [phone]).       Specialty medication(s) and dose(s) confirmed: Regimen is correct and unchanged.   Changes to medications: [name] reports no changes at this time.  Changes to insurance: No  Questions for the pharmacist: Yes: Patient experiencing hair loss/thinning.  Discussed this was more than likely due to cyclophosphamide therapy initiated about 6 weeks ago.  It is a known side effect and is reversible.  Usually occurs 3-6 weeks after starting cyclophosphamide.  Patient also reports stomach cramping and constipation.  Also more likely due to her rituxumab and cyclophosphamide treatments.  She has an appt with Langley Gauss 09/10/19 and will mention it at the appt.    Confirmed patient received Conservation officer, historic buildings with first shipment. The patient will receive a drug information handout for each medication shipped and additional FDA Medication Guides as required.       DISEASE/MEDICATION-SPECIFIC INFORMATION        N/A    SPECIALTY MEDICATION ADHERENCE     Medication Adherence    Patient reported X missed doses in the last month: 1  Specialty Medication: Entecavir 0.5mg   Patient is on additional specialty medications: No  Informant: patient  Confirmed plan for next specialty medication refill: delivery by pharmacy          Refill Coordination    Has the Patients' Contact Information Changed: No  Is the Shipping Address Different: No       entecavir 0.5 mg: 10 days of medicine on hand     SHIPPING     Shipping address confirmed in Epic. Delivery Scheduled: Yes, Expected medication delivery date: 09/16/19.     Medication will be delivered via Next Day Courier to the prescription address in Epic WAM.    Kade Demicco A Shari Heritage [facility] Specialty Pharmacist

## 2019-09-10 ENCOUNTER — Ambulatory Visit: Admit: 2019-09-10 | Discharge: 2019-09-10 | Payer: MEDICARE | Attending: Adult Health | Primary: Adult Health

## 2019-09-10 ENCOUNTER — Ambulatory Visit: Admit: 2019-09-10 | Discharge: 2019-09-10 | Payer: MEDICARE

## 2019-09-10 ENCOUNTER — Other Ambulatory Visit: Admit: 2019-09-10 | Discharge: 2019-09-10 | Payer: MEDICARE

## 2019-09-10 DIAGNOSIS — C9110 Chronic lymphocytic leukemia of B-cell type not having achieved remission: Principal | ICD-10-CM

## 2019-09-10 DIAGNOSIS — D5919 Other autoimmune hemolytic anemia: Principal | ICD-10-CM

## 2019-09-10 DIAGNOSIS — D693 Immune thrombocytopenic purpura: Principal | ICD-10-CM

## 2019-09-10 LAB — CBC W/ AUTO DIFF
BASOPHILS ABSOLUTE COUNT: 0 10*9/L (ref 0.0–0.1)
BASOPHILS RELATIVE PERCENT: 1.1 %
EOSINOPHILS ABSOLUTE COUNT: 0.1 10*9/L (ref 0.0–0.4)
EOSINOPHILS RELATIVE PERCENT: 4.6 %
HEMATOCRIT: 35.7 % — ABNORMAL LOW (ref 36.0–46.0)
HEMOGLOBIN: 11.8 g/dL — ABNORMAL LOW (ref 12.0–16.0)
LARGE UNSTAINED CELLS: 3 % (ref 0–4)
LYMPHOCYTES ABSOLUTE COUNT: 0.5 10*9/L — ABNORMAL LOW (ref 1.5–5.0)
LYMPHOCYTES RELATIVE PERCENT: 23.9 %
MEAN CORPUSCULAR HEMOGLOBIN CONC: 33.1 g/dL (ref 31.0–37.0)
MEAN CORPUSCULAR VOLUME: 85 fL (ref 80.0–100.0)
MEAN PLATELET VOLUME: 9.8 fL (ref 7.0–10.0)
MONOCYTES RELATIVE PERCENT: 10.6 %
NEUTROPHILS ABSOLUTE COUNT: 1.2 10*9/L — ABNORMAL LOW (ref 2.0–7.5)
NEUTROPHILS RELATIVE PERCENT: 56.8 %
PLATELET COUNT: 58 10*9/L — ABNORMAL LOW (ref 150–440)
RED BLOOD CELL COUNT: 4.2 10*12/L (ref 4.00–5.20)
RED CELL DISTRIBUTION WIDTH: 20.6 % — ABNORMAL HIGH (ref 12.0–15.0)
WBC ADJUSTED: 2.2 10*9/L — ABNORMAL LOW (ref 4.5–11.0)

## 2019-09-10 LAB — COMPREHENSIVE METABOLIC PANEL
ALBUMIN: 3.7 g/dL (ref 3.5–5.0)
ALKALINE PHOSPHATASE: 590 U/L — ABNORMAL HIGH (ref 38–126)
ALT (SGPT): 100 U/L — ABNORMAL HIGH (ref <35)
AST (SGOT): 94 U/L — ABNORMAL HIGH (ref 14–38)
BILIRUBIN TOTAL: 1.1 mg/dL (ref 0.0–1.2)
BLOOD UREA NITROGEN: 19 mg/dL (ref 7–21)
BUN / CREAT RATIO: 26
CALCIUM: 8.8 mg/dL (ref 8.5–10.2)
CHLORIDE: 103 mmol/L (ref 98–107)
CO2: 25 mmol/L (ref 22.0–30.0)
CREATININE: 0.74 mg/dL (ref 0.60–1.00)
EGFR CKD-EPI AA FEMALE: 88 mL/min/{1.73_m2} (ref >=60)
EGFR CKD-EPI NON-AA FEMALE: 77 mL/min/{1.73_m2} (ref >=60)
GLUCOSE RANDOM: 116 mg/dL (ref 70–179)
PROTEIN TOTAL: 6 g/dL — ABNORMAL LOW (ref 6.5–8.3)
SODIUM: 134 mmol/L — ABNORMAL LOW (ref 135–145)

## 2019-09-10 LAB — LACTATE DEHYDROGENASE: Lactate dehydrogenase:CCnc:Pt:Ser/Plas:Qn:Reaction: pyruvate to lactate: 672 — ABNORMAL HIGH

## 2019-09-10 LAB — HBV DNA QUANT: Hepatitis B virus DNA:PrThr:Pt:Bld:Ord:Probe.amp.tar: NOT DETECTED

## 2019-09-10 LAB — LARGE UNSTAINED CELLS: Lab: 3

## 2019-09-10 LAB — ALBUMIN: Albumin:MCnc:Pt:Ser/Plas:Qn:: 3.7

## 2019-09-10 NOTE — Unmapped (Signed)
Port accessed.  Labs collected & sent for analysis.  To next appt.  Care provided by C. Burks RN.

## 2019-09-11 MED ORDER — DEXAMETHASONE 2 MG TABLET
ORAL_TABLET | Freq: Every day | ORAL | 0 refills | 6.00000 days | Status: CP
Start: 2019-09-11 — End: 2019-09-17

## 2019-09-11 NOTE — Unmapped (Signed)
Pt received in NAD. Port accessed previously and positive blood return noted. Pt AST 100, ALT 94. OK to treat per Jannifer Franklin, NP.   Treatment completed without difficulty. Port flushed with 500 units of heparin and deacessed. Patient discharged ambulatory in NAD.

## 2019-09-11 NOTE — Unmapped (Signed)
Results for orders placed or performed in visit on 09/10/19   Hepatitis B DNA, Quantitative, PCR   Result Value Ref Range    HBV DNA Quant Not Detected Not Detected   Comprehensive metabolic panel   Result Value Ref Range    Sodium 134 (L) 135 - 145 mmol/L    Potassium 3.9 3.5 - 5.0 mmol/L    Chloride 103 98 - 107 mmol/L    Anion Gap 6 (L) 7 - 15 mmol/L    CO2 25.0 22.0 - 30.0 mmol/L    BUN 19 7 - 21 mg/dL    Creatinine 1.61 0.96 - 1.00 mg/dL    BUN/Creatinine Ratio 26     EGFR CKD-EPI Non-African American, Female 77 >=60 mL/min/1.9m2    EGFR CKD-EPI African American, Female 54 >=60 mL/min/1.11m2    Glucose 116 70 - 179 mg/dL    Calcium 8.8 8.5 - 04.5 mg/dL    Albumin 3.7 3.5 - 5.0 g/dL    Total Protein 6.0 (L) 6.5 - 8.3 g/dL    Total Bilirubin 1.1 0.0 - 1.2 mg/dL    AST 94 (H) 14 - 38 U/L    ALT 100 (H) <35 U/L    Alkaline Phosphatase 590 (H) 38 - 126 U/L   Lactate dehydrogenase   Result Value Ref Range    LDH 672 (H) 338 - 610 U/L   CBC w/ Differential   Result Value Ref Range    WBC 2.2 (L) 4.5 - 11.0 10*9/L    RBC 4.20 4.00 - 5.20 10*12/L    HGB 11.8 (L) 12.0 - 16.0 g/dL    HCT 40.9 (L) 81.1 - 46.0 %    MCV 85.0 80.0 - 100.0 fL    MCH 28.2 26.0 - 34.0 pg    MCHC 33.1 31.0 - 37.0 g/dL    RDW 91.4 (H) 78.2 - 15.0 %    MPV 9.8 7.0 - 10.0 fL    Platelet 58 (L) 150 - 440 10*9/L    Variable HGB Concentration Slight (A) Not Present    Neutrophils % 56.8 %    Lymphocytes % 23.9 %    Monocytes % 10.6 %    Eosinophils % 4.6 %    Basophils % 1.1 %    Absolute Neutrophils 1.2 (L) 2.0 - 7.5 10*9/L    Absolute Lymphocytes 0.5 (L) 1.5 - 5.0 10*9/L    Absolute Monocytes 0.2 0.2 - 0.8 10*9/L    Absolute Eosinophils 0.1 0.0 - 0.4 10*9/L    Absolute Basophils 0.0 0.0 - 0.1 10*9/L    Large Unstained Cells 3 0 - 4 %    Microcytosis Slight (A) Not Present    Macrocytosis Slight (A) Not Present    Anisocytosis Moderate (A) Not Present    Hypochromasia Slight (A) Not Present

## 2019-09-11 NOTE — Unmapped (Signed)
Contacted Katrina Gould to clarify question regarding LLS grant. Restated that per LLS, grant is active and pharmacy benefits are being applied. Per Katrina Gould, there is a grant that is to be applied to hospital expenses as well. Advised pt to call Financial Services at [phone] for clarification. No other concerns today. Emotional support provided. Pt verbalized understanding and expressed appreciation for the call.

## 2019-09-12 NOTE — Unmapped (Signed)
09/12/19 left vm to confirm 5/18, and 5/25 for Pat. 6/1 will be scheduled in [facility].

## 2019-09-15 MED FILL — ENTECAVIR 0.5 MG TABLET: 30 days supply | Qty: 30 | Fill #2 | Status: AC

## 2019-09-15 MED FILL — ENTECAVIR 0.5 MG TABLET: ORAL | 30 days supply | Qty: 30 | Fill #2

## 2019-09-15 NOTE — Unmapped (Signed)
Hi,     Pat contacted the Communication Center regarding the following:    - York Spaniel she was returning your call and has been trying to reach you since Friday morning.    Please contact at [phone].    Thanks in advance,    Vernie Ammons  [facility]   [phone]

## 2019-09-15 NOTE — Unmapped (Signed)
Per Ms. Hackenberg, grant is not being correctly applied to hospital bills. NN contacted Patient Financial Services to investigate. PFS recommends follow up with Manufacturing engineer. NN will follow up and update pt as appropriate.

## 2019-09-16 ENCOUNTER — Ambulatory Visit: Admit: 2019-09-16 | Discharge: 2019-09-17 | Payer: MEDICARE

## 2019-09-16 DIAGNOSIS — C9110 Chronic lymphocytic leukemia of B-cell type not having achieved remission: Principal | ICD-10-CM

## 2019-09-16 LAB — CBC W/ AUTO DIFF
BASOPHILS ABSOLUTE COUNT: 0 10*9/L (ref 0.0–0.1)
BASOPHILS RELATIVE PERCENT: 0.7 %
EOSINOPHILS RELATIVE PERCENT: 0.6 %
HEMATOCRIT: 32.4 % — ABNORMAL LOW (ref 35.0–44.0)
HEMOGLOBIN: 11.1 g/dL — ABNORMAL LOW (ref 12.0–15.5)
LYMPHOCYTES ABSOLUTE COUNT: 0.4 10*9/L — ABNORMAL LOW (ref 0.7–4.0)
LYMPHOCYTES RELATIVE PERCENT: 12.3 %
MEAN CORPUSCULAR HEMOGLOBIN CONC: 34.1 g/dL (ref 30.0–36.0)
MEAN CORPUSCULAR HEMOGLOBIN: 28.5 pg (ref 26.0–34.0)
MEAN CORPUSCULAR VOLUME: 83.5 fL (ref 82.0–98.0)
MEAN PLATELET VOLUME: 7.2 fL (ref 7.0–10.0)
MONOCYTES ABSOLUTE COUNT: 0.2 10*9/L (ref 0.1–1.0)
MONOCYTES RELATIVE PERCENT: 6.3 %
NEUTROPHILS ABSOLUTE COUNT: 2.3 10*9/L (ref 1.7–7.7)
NEUTROPHILS RELATIVE PERCENT: 80.1 %
NUCLEATED RED BLOOD CELLS: 0 /100{WBCs} (ref <=4)
PLATELET COUNT: 86 10*9/L — ABNORMAL LOW (ref 150–450)
RED BLOOD CELL COUNT: 3.88 10*12/L — ABNORMAL LOW (ref 3.90–5.03)
RED CELL DISTRIBUTION WIDTH: 23 % — ABNORMAL HIGH (ref 12.0–15.0)
WBC ADJUSTED: 2.9 10*9/L — ABNORMAL LOW (ref 3.5–10.5)

## 2019-09-16 LAB — MEAN CORPUSCULAR HEMOGLOBIN: Erythrocyte mean corpuscular hemoglobin:EntMass:Pt:RBC:Qn:Automated count: 28.5

## 2019-09-16 NOTE — Unmapped (Signed)
Contacted Ms. Brousseau to advise [employer], [phone] and hours. Emotional support provided. Pt verbalized understanding and expressed appreciation for the call.

## 2019-09-16 NOTE — Unmapped (Signed)
Patient arrived to infusion, no acute distress noted. Port accessed and labs collected as ordered. Labs reviewed. Labs out of range for treatment, no transfusion necessary. Port flushed with heparin and deaccessed. Patient discharged home to self care and in stable condition. AVS printed and given to patient.

## 2019-09-16 NOTE — Unmapped (Signed)
Infusion on 09/16/2019   Component Date Value Ref Range Status   ??? WBC 09/16/2019 2.9* 3.5 - 10.5 10*9/L Final   ??? RBC 09/16/2019 3.88* 3.90 - 5.03 10*12/L Final   ??? HGB 09/16/2019 11.1* 12.0 - 15.5 g/dL Final   ??? HCT 16/01/9603 32.4* 35.0 - 44.0 % Final   ??? MCV 09/16/2019 83.5  82.0 - 98.0 fL Final   ??? MCH 09/16/2019 28.5  26.0 - 34.0 pg Final   ??? MCHC 09/16/2019 34.1  30.0 - 36.0 g/dL Final   ??? RDW 54/12/8117 23.0* 12.0 - 15.0 % Final   ??? MPV 09/16/2019 7.2  7.0 - 10.0 fL Final   ??? Platelet 09/16/2019 86* 150 - 450 10*9/L Final   ??? nRBC 09/16/2019 0  <=4 /100 WBCs Final   ??? Neutrophils % 09/16/2019 80.1  % Final   ??? Lymphocytes % 09/16/2019 12.3  % Final   ??? Monocytes % 09/16/2019 6.3  % Final   ??? Eosinophils % 09/16/2019 0.6  % Final   ??? Basophils % 09/16/2019 0.7  % Final   ??? Absolute Neutrophils 09/16/2019 2.3  1.7 - 7.7 10*9/L Final   ??? Absolute Lymphocytes 09/16/2019 0.4* 0.7 - 4.0 10*9/L Final   ??? Absolute Monocytes 09/16/2019 0.2  0.1 - 1.0 10*9/L Final   ??? Absolute Eosinophils 09/16/2019 0.0  0.0 - 0.7 10*9/L Final   ??? Absolute Basophils 09/16/2019 0.0  0.0 - 0.1 10*9/L Final   ??? Anisocytosis 09/16/2019 Marked* Not Present Final

## 2019-09-23 ENCOUNTER — Ambulatory Visit: Admit: 2019-09-23 | Discharge: 2019-09-24 | Payer: MEDICARE

## 2019-09-23 DIAGNOSIS — C9110 Chronic lymphocytic leukemia of B-cell type not having achieved remission: Principal | ICD-10-CM

## 2019-09-23 LAB — CBC W/ AUTO DIFF
BASOPHILS ABSOLUTE COUNT: 0 10*9/L (ref 0.0–0.1)
BASOPHILS RELATIVE PERCENT: 1.6 %
EOSINOPHILS ABSOLUTE COUNT: 0.1 10*9/L (ref 0.0–0.7)
EOSINOPHILS RELATIVE PERCENT: 3.9 %
HEMATOCRIT: 33.3 % — ABNORMAL LOW (ref 35.0–44.0)
HEMOGLOBIN: 11.2 g/dL — ABNORMAL LOW (ref 12.0–15.5)
LYMPHOCYTES ABSOLUTE COUNT: 0.4 10*9/L — ABNORMAL LOW (ref 0.7–4.0)
LYMPHOCYTES RELATIVE PERCENT: 30.1 %
MEAN CORPUSCULAR HEMOGLOBIN CONC: 33.6 g/dL (ref 30.0–36.0)
MEAN CORPUSCULAR HEMOGLOBIN: 28.2 pg (ref 26.0–34.0)
MEAN CORPUSCULAR VOLUME: 84 fL (ref 82.0–98.0)
MEAN PLATELET VOLUME: 7.6 fL (ref 7.0–10.0)
MONOCYTES ABSOLUTE COUNT: 0.2 10*9/L (ref 0.1–1.0)
MONOCYTES RELATIVE PERCENT: 12.7 %
NEUTROPHILS RELATIVE PERCENT: 51.7 %
PLATELET COUNT: 49 10*9/L — ABNORMAL LOW (ref 150–450)
RED BLOOD CELL COUNT: 3.96 10*12/L (ref 3.90–5.03)
RED CELL DISTRIBUTION WIDTH: 22.9 % — ABNORMAL HIGH (ref 12.0–15.0)
WBC ADJUSTED: 1.5 10*9/L — ABNORMAL LOW (ref 3.5–10.5)

## 2019-09-23 LAB — SMEAR REVIEW

## 2019-09-23 LAB — SLIDE REVIEW

## 2019-09-23 LAB — MEAN CORPUSCULAR HEMOGLOBIN: Erythrocyte mean corpuscular hemoglobin:EntMass:Pt:RBC:Qn:Automated count: 28.2

## 2019-09-23 NOTE — Unmapped (Signed)
Patient arrived to infusion in stable condition for possible transfusion. Port accessed and labs drawn as ordered. Lab results reviewed. No transfusion indicated, HGB 11.2 and platelets 49. Port hep locked and deaccessed. AVS printed and given to patient. Patient discharged home to self care in stable condition.

## 2019-09-23 NOTE — Unmapped (Signed)
Infusion on 09/23/2019   Component Date Value Ref Range Status   ??? WBC 09/23/2019 1.5* 3.5 - 10.5 10*9/L Final   ??? RBC 09/23/2019 3.96  3.90 - 5.03 10*12/L Final   ??? HGB 09/23/2019 11.2* 12.0 - 15.5 g/dL Final   ??? HCT 16/01/9603 33.3* 35.0 - 44.0 % Final   ??? MCV 09/23/2019 84.0  82.0 - 98.0 fL Final   ??? MCH 09/23/2019 28.2  26.0 - 34.0 pg Final   ??? MCHC 09/23/2019 33.6  30.0 - 36.0 g/dL Final   ??? RDW 54/12/8117 22.9* 12.0 - 15.0 % Final   ??? MPV 09/23/2019 7.6  7.0 - 10.0 fL Final   ??? Platelet 09/23/2019 49* 150 - 450 10*9/L Final   ??? nRBC 09/23/2019 0  <=4 /100 WBCs Final   ??? Neutrophils % 09/23/2019 51.7  % Final   ??? Lymphocytes % 09/23/2019 30.1  % Final   ??? Monocytes % 09/23/2019 12.7  % Final   ??? Eosinophils % 09/23/2019 3.9  % Final   ??? Basophils % 09/23/2019 1.6  % Final   ??? Absolute Neutrophils 09/23/2019 0.8* 1.7 - 7.7 10*9/L Final   ??? Absolute Lymphocytes 09/23/2019 0.4* 0.7 - 4.0 10*9/L Final   ??? Absolute Monocytes 09/23/2019 0.2  0.1 - 1.0 10*9/L Final   ??? Absolute Eosinophils 09/23/2019 0.1  0.0 - 0.7 10*9/L Final   ??? Absolute Basophils 09/23/2019 0.0  0.0 - 0.1 10*9/L Final   ??? Anisocytosis 09/23/2019 Marked* Not Present Final

## 2019-09-30 ENCOUNTER — Ambulatory Visit: Admit: 2019-09-30 | Discharge: 2019-10-01 | Payer: MEDICARE

## 2019-09-30 ENCOUNTER — Other Ambulatory Visit: Admit: 2019-09-30 | Discharge: 2019-10-01 | Payer: MEDICARE

## 2019-09-30 DIAGNOSIS — C9110 Chronic lymphocytic leukemia of B-cell type not having achieved remission: Principal | ICD-10-CM

## 2019-09-30 LAB — CBC W/ AUTO DIFF
BASOPHILS ABSOLUTE COUNT: 0 10*9/L (ref 0.0–0.1)
BASOPHILS RELATIVE PERCENT: 0.5 %
EOSINOPHILS ABSOLUTE COUNT: 0.1 10*9/L (ref 0.0–0.4)
EOSINOPHILS RELATIVE PERCENT: 4.2 %
HEMATOCRIT: 37.3 % (ref 36.0–46.0)
HEMOGLOBIN: 12.1 g/dL (ref 12.0–16.0)
LYMPHOCYTES ABSOLUTE COUNT: 0.6 10*9/L — ABNORMAL LOW (ref 1.5–5.0)
LYMPHOCYTES RELATIVE PERCENT: 30.4 %
MEAN CORPUSCULAR HEMOGLOBIN CONC: 32.5 g/dL (ref 31.0–37.0)
MEAN CORPUSCULAR HEMOGLOBIN: 28.5 pg (ref 26.0–34.0)
MEAN CORPUSCULAR VOLUME: 87.6 fL (ref 80.0–100.0)
MEAN PLATELET VOLUME: 9.3 fL (ref 7.0–10.0)
MONOCYTES ABSOLUTE COUNT: 0.2 10*9/L (ref 0.2–0.8)
MONOCYTES RELATIVE PERCENT: 8.4 %
NEUTROPHILS ABSOLUTE COUNT: 1 10*9/L — ABNORMAL LOW (ref 2.0–7.5)
NEUTROPHILS RELATIVE PERCENT: 53.9 %
PLATELET COUNT: 61 10*9/L — ABNORMAL LOW (ref 150–440)
RED BLOOD CELL COUNT: 4.25 10*12/L (ref 4.00–5.20)
RED CELL DISTRIBUTION WIDTH: 19.6 % — ABNORMAL HIGH (ref 12.0–15.0)
WBC ADJUSTED: 1.9 10*9/L — ABNORMAL LOW (ref 4.5–11.0)

## 2019-09-30 LAB — COMPREHENSIVE METABOLIC PANEL
ALBUMIN: 3.6 g/dL (ref 3.5–5.0)
ALKALINE PHOSPHATASE: 360 U/L — ABNORMAL HIGH (ref 38–126)
ALT (SGPT): 58 U/L — ABNORMAL HIGH (ref <35)
ANION GAP: 4 mmol/L — ABNORMAL LOW (ref 7–15)
AST (SGOT): 57 U/L — ABNORMAL HIGH (ref 14–38)
BILIRUBIN TOTAL: 0.7 mg/dL (ref 0.0–1.2)
BLOOD UREA NITROGEN: 22 mg/dL — ABNORMAL HIGH (ref 7–21)
BUN / CREAT RATIO: 26
CALCIUM: 9.3 mg/dL (ref 8.5–10.2)
CHLORIDE: 108 mmol/L — ABNORMAL HIGH (ref 98–107)
CO2: 25 mmol/L (ref 22.0–30.0)
CREATININE: 0.85 mg/dL (ref 0.60–1.00)
EGFR CKD-EPI AA FEMALE: 75 mL/min/{1.73_m2} (ref >=60)
GLUCOSE RANDOM: 149 mg/dL (ref 70–179)
POTASSIUM: 3.9 mmol/L (ref 3.5–5.0)
PROTEIN TOTAL: 5.7 g/dL — ABNORMAL LOW (ref 6.5–8.3)
SODIUM: 137 mmol/L (ref 135–145)

## 2019-09-30 LAB — GLUCOSE RANDOM: Glucose:MCnc:Pt:Ser/Plas:Qn:: 149

## 2019-09-30 LAB — RED CELL DISTRIBUTION WIDTH: Lab: 19.6 — ABNORMAL HIGH

## 2019-09-30 NOTE — Unmapped (Signed)
Lab on 09/30/2019   Component Date Value Ref Range Status   ??? Antibody Screen 09/30/2019 NEG   Final   ??? Sodium 09/30/2019 137  135 - 145 mmol/L Final   ??? Potassium 09/30/2019 3.9  3.5 - 5.0 mmol/L Final   ??? Chloride 09/30/2019 108* 98 - 107 mmol/L Final   ??? Anion Gap 09/30/2019 4* 7 - 15 mmol/L Final   ??? CO2 09/30/2019 25.0  22.0 - 30.0 mmol/L Final   ??? BUN 09/30/2019 22* 7 - 21 mg/dL Final   ??? Creatinine 09/30/2019 0.85  0.60 - 1.00 mg/dL Final   ??? BUN/Creatinine Ratio 09/30/2019 26   Final   ??? EGFR CKD-EPI Non-African American,* 09/30/2019 65  >=60 mL/min/1.21m2 Final   ??? EGFR CKD-EPI African American, Fem* 09/30/2019 75  >=60 mL/min/1.33m2 Final   ??? Glucose 09/30/2019 149  70 - 179 mg/dL Final   ??? Calcium 16/01/9603 9.3  8.5 - 10.2 mg/dL Final   ??? Albumin 54/12/8117 3.6  3.5 - 5.0 g/dL Final   ??? Total Protein 09/30/2019 5.7* 6.5 - 8.3 g/dL Final   ??? Total Bilirubin 09/30/2019 0.7  0.0 - 1.2 mg/dL Final   ??? AST 14/78/2956 57* 14 - 38 U/L Final   ??? ALT 09/30/2019 58* <35 U/L Final   ??? Alkaline Phosphatase 09/30/2019 360* 38 - 126 U/L Final   ??? WBC 09/30/2019 1.9* 4.5 - 11.0 10*9/L Final   ??? RBC 09/30/2019 4.25  4.00 - 5.20 10*12/L Final   ??? HGB 09/30/2019 12.1  12.0 - 16.0 g/dL Final   ??? HCT 21/30/8657 37.3  36.0 - 46.0 % Final   ??? MCV 09/30/2019 87.6  80.0 - 100.0 fL Final   ??? MCH 09/30/2019 28.5  26.0 - 34.0 pg Final   ??? MCHC 09/30/2019 32.5  31.0 - 37.0 g/dL Final   ??? RDW 84/69/6295 19.6* 12.0 - 15.0 % Final   ??? MPV 09/30/2019 9.3  7.0 - 10.0 fL Final   ??? Platelet 09/30/2019 61* 150 - 440 10*9/L Final   ??? Variable HGB Concentration 09/30/2019 Slight* Not Present Final   ??? Neutrophils % 09/30/2019 53.9  % Final   ??? Lymphocytes % 09/30/2019 30.4  % Final   ??? Monocytes % 09/30/2019 8.4  % Final   ??? Eosinophils % 09/30/2019 4.2  % Final   ??? Basophils % 09/30/2019 0.5  % Final   ??? Absolute Neutrophils 09/30/2019 1.0* 2.0 - 7.5 10*9/L Final   ??? Absolute Lymphocytes 09/30/2019 0.6* 1.5 - 5.0 10*9/L Final   ??? Absolute Monocytes 09/30/2019 0.2  0.2 - 0.8 10*9/L Final   ??? Absolute Eosinophils 09/30/2019 0.1  0.0 - 0.4 10*9/L Final   ??? Absolute Basophils 09/30/2019 0.0  0.0 - 0.1 10*9/L Final   ??? Large Unstained Cells 09/30/2019 3  0 - 4 % Final   ??? Microcytosis 09/30/2019 Slight* Not Present Final   ??? Macrocytosis 09/30/2019 Slight* Not Present Final   ??? Anisocytosis 09/30/2019 Moderate* Not Present Final   ??? Hypochromasia 09/30/2019 Slight* Not Present Final

## 2019-09-30 NOTE — Unmapped (Signed)
Port accessed and labs drawn with no complications by Adam A.  Port flushed with saline.

## 2019-09-30 NOTE — Unmapped (Signed)
No treatment needed, pt dc home

## 2019-10-07 ENCOUNTER — Other Ambulatory Visit: Admit: 2019-10-07 | Discharge: 2019-10-08 | Payer: MEDICARE

## 2019-10-07 ENCOUNTER — Ambulatory Visit
Admit: 2019-10-07 | Discharge: 2019-10-08 | Payer: MEDICARE | Attending: Hematology & Oncology | Primary: Hematology & Oncology

## 2019-10-07 ENCOUNTER — Ambulatory Visit: Admit: 2019-10-07 | Discharge: 2019-10-08 | Payer: MEDICARE

## 2019-10-07 DIAGNOSIS — C9110 Chronic lymphocytic leukemia of B-cell type not having achieved remission: Principal | ICD-10-CM

## 2019-10-07 DIAGNOSIS — D693 Immune thrombocytopenic purpura: Principal | ICD-10-CM

## 2019-10-07 DIAGNOSIS — D5919 Other autoimmune hemolytic anemia: Principal | ICD-10-CM

## 2019-10-07 LAB — COMPREHENSIVE METABOLIC PANEL
ALBUMIN: 3.7 g/dL (ref 3.5–5.0)
ALKALINE PHOSPHATASE: 413 U/L — ABNORMAL HIGH (ref 38–126)
ALT (SGPT): 67 U/L — ABNORMAL HIGH (ref <35)
AST (SGOT): 68 U/L — ABNORMAL HIGH (ref 14–38)
BILIRUBIN TOTAL: 0.7 mg/dL (ref 0.0–1.2)
BLOOD UREA NITROGEN: 15 mg/dL (ref 7–21)
BUN / CREAT RATIO: 21
CALCIUM: 8.8 mg/dL (ref 8.5–10.2)
CHLORIDE: 105 mmol/L (ref 98–107)
CO2: 23 mmol/L (ref 22.0–30.0)
CREATININE: 0.73 mg/dL (ref 0.60–1.00)
EGFR CKD-EPI AA FEMALE: 90 mL/min/{1.73_m2} (ref >=60)
EGFR CKD-EPI NON-AA FEMALE: 78 mL/min/{1.73_m2} (ref >=60)
GLUCOSE RANDOM: 101 mg/dL (ref 70–179)
POTASSIUM: 4.3 mmol/L (ref 3.5–5.0)
PROTEIN TOTAL: 5.9 g/dL — ABNORMAL LOW (ref 6.5–8.3)
SODIUM: 137 mmol/L (ref 135–145)

## 2019-10-07 LAB — CBC W/ AUTO DIFF
BASOPHILS RELATIVE PERCENT: 0.5 %
EOSINOPHILS ABSOLUTE COUNT: 0.1 10*9/L (ref 0.0–0.4)
EOSINOPHILS RELATIVE PERCENT: 3.8 %
HEMATOCRIT: 36.3 % (ref 36.0–46.0)
HEMOGLOBIN: 12.1 g/dL (ref 12.0–16.0)
LARGE UNSTAINED CELLS: 3 % (ref 0–4)
LYMPHOCYTES ABSOLUTE COUNT: 0.5 10*9/L — ABNORMAL LOW (ref 1.5–5.0)
LYMPHOCYTES RELATIVE PERCENT: 21.5 %
MEAN CORPUSCULAR HEMOGLOBIN CONC: 33.3 g/dL (ref 31.0–37.0)
MEAN CORPUSCULAR HEMOGLOBIN: 29.3 pg (ref 26.0–34.0)
MEAN PLATELET VOLUME: 9.4 fL (ref 7.0–10.0)
MONOCYTES ABSOLUTE COUNT: 0.3 10*9/L (ref 0.2–0.8)
MONOCYTES RELATIVE PERCENT: 11.9 %
NEUTROPHILS ABSOLUTE COUNT: 1.5 10*9/L — ABNORMAL LOW (ref 2.0–7.5)
NEUTROPHILS RELATIVE PERCENT: 58.8 %
PLATELET COUNT: 43 10*9/L — ABNORMAL LOW (ref 150–440)
RED BLOOD CELL COUNT: 4.13 10*12/L (ref 4.00–5.20)
RED CELL DISTRIBUTION WIDTH: 19.5 % — ABNORMAL HIGH (ref 12.0–15.0)
WBC ADJUSTED: 2.5 10*9/L — ABNORMAL LOW (ref 4.5–11.0)

## 2019-10-07 LAB — LACTATE DEHYDROGENASE: Lactate dehydrogenase:CCnc:Pt:Ser/Plas:Qn:Reaction: pyruvate to lactate: 533

## 2019-10-07 LAB — BLOOD UREA NITROGEN: Urea nitrogen:MCnc:Pt:Ser/Plas:Qn:: 15

## 2019-10-07 LAB — PLATELET COUNT: Platelets:NCnc:Pt:Bld:Qn:Automated count: 43 — ABNORMAL LOW

## 2019-10-07 MED ADMIN — dexamethasone (DECADRON) 4 mg/mL injection 6 mg: 6 mg | INTRAVENOUS | @ 19:00:00 | Stop: 2019-10-07

## 2019-10-07 MED ADMIN — riTUXimab-abbs (TRUXIMA) 765 mg in sodium chloride (NS) 0.9 % 250 mL rapid infusion: 375 mg/m2 | INTRAVENOUS | @ 20:00:00 | Stop: 2019-10-07

## 2019-10-07 MED ADMIN — cyclophosphamide (CYTOXAN) 1,020 mg in sodium chloride (NS) 0.9 % 100 mL IVPB: 500 mg/m2 | INTRAVENOUS | @ 22:00:00 | Stop: 2019-10-07

## 2019-10-07 MED ADMIN — sodium chloride (NS) 0.9 % infusion: 500 mL/h | INTRAVENOUS | @ 19:00:00 | Stop: 2019-10-07

## 2019-10-07 MED ADMIN — acetaminophen (TYLENOL) tablet 650 mg: 650 mg | ORAL | @ 19:00:00 | Stop: 2019-10-07

## 2019-10-07 MED ADMIN — ondansetron (ZOFRAN) tablet 24 mg: 24 mg | ORAL | @ 19:00:00 | Stop: 2019-10-07

## 2019-10-07 MED ADMIN — diphenhydrAMINE (BENADRYL) capsule 50 mg: 50 mg | ORAL | @ 19:00:00 | Stop: 2019-10-07

## 2019-10-07 MED ADMIN — heparin, porcine (PF) 100 unit/mL injection 500 Units: 500 [IU] | INTRAVENOUS | @ 23:00:00 | Stop: 2019-10-08

## 2019-10-07 NOTE — Unmapped (Addendum)
Please return in 4 weeks for a follow up visit and labs and 5th cycle of chemo.    We will check counts in 2 weeks.    Platelets are a little bit lower - I hope these bounce back but if they aren't better in 4 weeks time we will discuss alternate approaches to help with them (possibly a different oral chemo pill).    Please call us if you experience:    1. Nausea or vomiting not controlled by nausea medicines  2. Fever of 100.5 F or higher, shaking chills, drenching night sweats.  3. Uncontrolled pain  4. Any rapidly enlarging lymph node or mass  5. Unintentional weight loss  6. Any other concerning symptom     MyChart Messages  For your safety and best care, please DO NOT use MyChart messages to report symptoms. (Symptoms should be reported by calling the nurse triage line). Please use MyChart for non-urgent matters such as general questions, non-urgent prescription refills, or non-urgent scheduling issues.     ?? Please do not use MyChart for URGENT messages, as messages are only checked during regular business hours.     ?? Please note that MyChart messages may be routed a central pool and one of your provider???s team members will get back to you.  - Expect up to 3 business days for response     If you have any other questions, please do not hesitate to contact us.    Nurse Navigator: Milinda Antis, RN    Nurse Practitioner: Langley Gauss    For health related questions Monday through Friday 8 AM??? 5 PM : please call the office at [phone] and ask to speak with a nurse.  For appointment changes call: Main Clinic [phone].  Toll free number is [phone].    On Nights, Weekends and Holidays:  Call [phone] and ask for the adult hematologist/oncologist on call.      [facility]  [address]  [place], [place] [address]  [email or web address]    Results for orders placed or performed in visit on 10/07/19   CBC w/ Differential   Result Value Ref Range    WBC 2.5 (L) 4.5 - 11.0 10*9/L    RBC 4.13 4.00 - 5.20 10*12/L    HGB 12.1 12.0 - 16.0 g/dL    HCT 24.4 01.0 - 27.2 %    MCV 87.9 80.0 - 100.0 fL    MCH 29.3 26.0 - 34.0 pg    MCHC 33.3 31.0 - 37.0 g/dL    RDW 53.6 (H) 64.4 - 15.0 %    MPV 9.4 7.0 - 10.0 fL    Platelet 43 (L) 150 - 440 10*9/L    Variable HGB Concentration Slight (A) Not Present    Neutrophils % 58.8 %    Lymphocytes % 21.5 %    Monocytes % 11.9 %    Eosinophils % 3.8 %    Basophils % 0.5 %    Absolute Neutrophils 1.5 (L) 2.0 - 7.5 10*9/L    Absolute Lymphocytes 0.5 (L) 1.5 - 5.0 10*9/L    Absolute Monocytes 0.3 0.2 - 0.8 10*9/L    Absolute Eosinophils 0.1 0.0 - 0.4 10*9/L    Absolute Basophils 0.0 0.0 - 0.1 10*9/L    Large Unstained Cells 3 0 - 4 %    Microcytosis Slight (A) Not Present    Macrocytosis Slight (A) Not Present    Anisocytosis Moderate (A) Not Present    Hypochromasia Slight (A)  Not Present

## 2019-10-07 NOTE — Unmapped (Signed)
Labs found to be within parameters for treatment today. Request for drug sent to pharmacy.

## 2019-10-07 NOTE — Unmapped (Signed)
Addended by: Clarita Leber on: 10/07/2019 02:36 PM     Modules accepted: Orders

## 2019-10-07 NOTE — Unmapped (Signed)
[facility] Visit    Patient Name: Katrina Gould  Patient Age: 79 y.o.  Encounter Date: 10/07/2019      Primary Care Provider:  Lauro Regulus, MD    Referring Physician:  Self, Referred    REASON FOR VISIT:   79 y.o. female here to follow up for her CLL.    ASSESSMENT/PLAN     Katrina Gould is a 79 y.o. 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 Disease markers: 46,XX,der(4)t(4;4)(p14;q21). IGHV unmutated (0%, 3-33*01). B71m 5.05. No TP53 mutation. High-risk by CLL-IPI (6 points).    Platelet count downtrended despite the eltrombopag. BM bx 03/26/19 most consistent with ITP and CLL. After discussion with patient/son, stopped eltrombopag and started obinutuzumab 04/2019. Patient has now received 5 doses without improvement in platelets.     Given refractory nature of CLL-related ITP, initiated RCD. In a small trial of RCD conducted in 8 CLL patients with steroid refractory AIHA, all 8 patients achieved remission Chales Abrahams et al, Leukemia 2002). While the patient has not yet been treated with a BTK inhibitor for CLL-directed treatment, given that the patient's thrombocytopenia is her major problem with a controlled lymphocyte count and very little marrow involvement by her CLL, we recommend RCD q28 days Chales Abrahams et al, Leukemia 2002) rather than BTK inhibitor given its effectiveness specifically for CLL-related autoimmune cytopenias. If no response to RCD, can trial BTK inhibitor.    Early cycles complicated by severe cytopenias which are improved after dose reductions.  - specifically a 50% dose-reduction of steroids and 33% dose-reduction of cyclophosphamide.    Today her plts are 43k which is lower than prior - it's possible the dose reduction isn't as effective though I think returning to full dose is too risky for complications. We decided to proceed with c4 today and if plts remain in lower range on next 2 checks (in 2 and 4 weeks) we may need to abandon further cycles and discuss a BTKi (acala).    Liver ultrasound is re-scheduled for next week to work up increased LFTs and RUQ pain.    Plans and Recommendations:    1. CLL with CLL-related ITP  - C4D1 RCD today   -- rituximab 375 mg/m2 on D1  -- cyclophosphamide 500 mg/m2 on D1 (dose-reduced)  -- dexamethasone  6 mg on D1-7 (dose-reduced)  - plan for cycles q4 weeks until best platelet response, plan for no more than 6 cycles, hopefully less  - see above re plt response  - compazine, ativan prn for nausea  - biweekly labs/transfusion support as needed    2. 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 Hx Platelet transfusion reaction: Patient has had febrile non-hemolytic transfusion reaction x 2. Also had chest pain during reaction possibly related to underlying CAD.  -- infuse platelets more slowly per Transfusion Medicine  -- follows with Cardiology  -- hold ASA while platelets < 50K  -- low threshold to reduce dexamethasone dose  -- monitor sxs    4. 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 Given risk of Hep B reactivation with RTX, on entecavir.   -- continue entecavir 0.5 mg daily.   - monitor HBV DNA PCR regularly - neg 5/12 and pending from today  - follow LFTs  closely  -- Continue x 1 year s/p Ritux    5. Transaminitis: mild elevations   --hold statin  - liver ultrasound next week    6. Chest pain: Has had past episodes of chest pain, particularly with platelet transfusions. Recent TTE with EF > 55%. Cardiac cath not performed 2/2 thrombocytopenia but stress test recommended when platelets improve.   -- Cardiology follow up.     7. DOE and occasional rapid heart rate  - stable and chronic    8. Arthritis  - continues to use topical CBD oil and this has helped  - May continue prn tylenol but should avoid NSAIDs  - knows to avoid oral CBD oil    9 Constipation  - senna daily and PRN miralax  ??  10. Health Maintenance:   - rec annual flu vaccine  - recommend COVID-19 vaccine, but will need platelets consistently 50K or higher. We will defer until platelets more stable.       INTERVAL HISTORY     Patient here for follow up.    Notes mild RUQ pain. Delayed her liver US because her AC broke. Will go on 6/14 now.    Pain on right upper back near shoulder blade continues though is improved after a steroid injection.    She reports that her fatigue continues.     Further ROS negative.       HEMATOLOGICAL / ONCOLOGICAL HISTORY:     Oncology History Overview Note   CLL    First seen in oncology clinic locally 03/31/16    Had a left axillary biopsy 03/15/16 that confirmed CLL/SLL: Flow showed monoclonal B-cell population with CD5, CD23, kappa light chain. IHC showed positive CD20 (diffuse), negative for cyclin D1.    No constitutional symptoms except some sweats    Labs: Hb 12.0, Hct 34.8, plt 147. WBC was 10.6 with ANC 2.5. ALC was 7.5K.    CLL FISH studies: normal panel. No cells with 11q, tri 12, 17p or 13q, and the 11;14 translocation also not detected.    Karyotype: 46,XX,der(4)t(4;4)(p14;q21)[3]/45,XX,dic(6;17)(q12;p11.2) (no loss of TP53 on FISH)    Molecular: No TP53 mutation    CLL-IPI: As per Lancet Oncol Vol 15 October 2014  Includes (1) TP53 status (no abnormalities vs. Del 17p and/or TP53 mutation - 4 points)=0, (2) IGHV mutation status (mut vs unmut - 2 points)=2 (3) serum B2-microglobulin (</=3.5 vs >3.5 - 2 points)=2 (4) clinical stage (Rai 0 vs Rai I-IV - 1 point)=1 and (5) age (</=65 vs >65 - 1 point)=1    Total: 6 points    Low risk (0-1 points)= 93.2% OS at 5 years  Int risk (2-3 points)= 79.3%  High risk (4-6 points)= 63.3%    Very high risk (7-10 points)= 23.3%     Received rituximab weekly x4 from 09/14/17 to 10/05/17  - For ITP 2/t CLL vs. CLL  - No response    Started ven/obi regimen on 10/23/17  Baseline counts:  WBC 45.9  ALC 2.6  Hb 12.5  Plt 10 03/22/18: BM bx without overt CLL, MRD testing at [facility] revealed CD5-positive, kappa light chain-restricted B-cell  population is detected. MRD = 0.32%.    05/15/18: Repeated BM bx given persistent low counts - ~1% CLL in otherwise normal appearing marrow. Myeloid mutation panel negative. Karyotype: 45,XX,dic(6;17)(q12;p11.2)[2]/46,XX[18].       CLL (chronic lymphocytic leukemia) (CMS-HCC)   03/15/2016 Initial Diagnosis    CLL (chronic lymphocytic leukemia) (RAF-HCC)     09/14/2017 -  10/05/2017 Chemotherapy    Rituximab weekly x4  No response     09/14/2017 - 10/05/2017 Chemotherapy    Rituximab x 4     10/23/2017 -  Chemotherapy    Began ven/obi     03/19/2018 Adverse Reaction    Obinutuzumab held given persistent thrombocytopenia. Marrow performed on 03/22/18, patient with no overt CLL (0.32% by MRD flow testing from [facility]). Do not plan to give 6th cycle of obi (unless counts recover in a reasonable timeframe).     03/22/2018 Biopsy    Bone marrow, right iliac, aspiration and biopsy  -  Normocellular bone marrow (30%) with erythroid-predominant trilineage hematopoiesis   -  Suspicious for low level (<5%) residual involvement by chronic lymphocytic leukemia in a background of reactive lymphoid aggregates  -  Routine cytogenetic results reveal a normal karyotype; FISH (CLL panel) results are normal in a limited analysis; see details below (case [id number]).  -  Flow cytometric CLL MRD results reveal a CD5-positive, kappa light chain-restricted B-cell population is detected; MRD = 0.32%; see details in the Referral Testing Report in the Media Tab.     04/09/2018 Adverse Reaction    ANC down to 0.2. Held venetoclax (was previously at 200 mg daily).     05/15/2018 Biopsy    Bone marrow, right iliac, aspiration and biopsy  -  Mildly hypercellular bone marrow (40%) with trilineage hematopoiesis and persistent low level (less than 1% of cells) involvement by chronic lymphocytic leukemia by flow cytometric analysis  -  Numerous reactive lymphoid aggregates  -  No significant morphologic dyspoiesis or increase in blasts  -  Routine cytogenetic results reveal an abnormal karyotype; FISH (CLL panel) results are normal; see details below (case [id number]).  -  Myeloid mutation panel results reveal no mutations were identified in the 34 genes analyzed; see details below (case [id number]).     03/26/2019 Biopsy    Bone marrow, right iliac, aspiration and biopsy  -  Slightly hypercellular bone marrow (40%) with persistent low-level involvement by patient's known chronic lymphocytic leukemia, representing approximately 5% of marrow cells by PAX5 immunohistochemical stain.  -  Increased megakaryocytes identified on bone marrow aspirate smears (see Comment)  -  No significant morphologic dyspoiesis or increase in blasts.  -  Routine cytogenetic analysis is pending.  -  Myeloid mutation panel is pending.    Although not a specific finding, increased megakaryocytes can be seen in the setting of immune-mediated thrombocytopenia (ITP).     08/13/2019 -  Chemotherapy    Cycle 2 RCD      riTUXimab IV 375 mg/m2 on day 1,   cyclophosphamide 750 mg/m2 IV on day 1  Dexamethasone 12 mg Days 1-7 - dose reduced to 6mg  on day 4 due to headaches, drunken feeling     09/10/2019 -  Chemotherapy    Cycle 3 RCD      riTUXimab IV 375 mg/m2 on day 1,   cyclophosphamide 500 mg/m2 IV on day 1 (33% dose-reduction)  Dexamethasone 6 mg Days 1-7     Dose reductions due to neutropenia (1 week ANC 0.1) and symptoms     B-cell chronic lymphocytic leukemia (CMS-HCC)   09/05/2017 - 10/11/2017 Chemotherapy    Chemotherapy Treatment    Treatment Goal Control   Line of Treatment [No plan line of treatment]   Plan Name OP RITUXIMAB    Start Date 09/14/2017   End Date 10/05/2017   Provider Pernell Dupre, MD   Chemotherapy riTUXimab (RITUXAN)  712.5 mg in sodium chloride (NS) 0.9 % 500 mL IVPB, 375 mg/m2 = 712.5 mg, Intravenous, Once, 1 of 1 cycle  Administration: 712.5 mg (09/14/2017) riTUXimab (RITUXAN) 712.5 mg in sodium chloride (NS) 0.9 % 250 mL rapid infusion, 375 mg/m2 = 712.5 mg, Intravenous, Once, 1 of 1 cycle  Administration: 712.5 mg (09/21/2017), 712.5 mg (09/28/2017), 712.5 mg (10/05/2017)        10/16/2017 Initial Diagnosis    Chronic lymphocytic leukemia, Rai stage IV (CMS-HCC)     10/23/2017 - 03/18/2018 Chemotherapy    OP OBINUTUZUMAB AND VENETOCLAX  chlorambucil 0.5 mg/kg PO on days 1, 15, obinutuzumab 100 mg IV on day 1, obinutuzumab 900 mg IV on day 2, obinutuzumab 1,000 mg IV on days 8,15 on Cycle 1, then obinutuzumab 1,000 mg IV on day 1 for sequent cycles, every 28 days.     04/07/2019 - 06/30/2019 Chemotherapy    OP CLL OBINUTUZUMAB  chlorambucil 0.5 mg/kg PO on days 1, 15, obinutuzumab 100 mg IV on day 1, obinutuzumab 900 mg IV on day 2, obinutuzumab 1,000 mg IV on days 8,15 on Cycle 1, then obinutuzumab 1,000 mg IV on day 1 for sequent cycles, every 28 days.     07/16/2019 -  Chemotherapy    Cycle 1 RCD     riTUXimab IV 375 mg/m2 on day 1,   cyclophosphamide 750 mg/m2 IV on day 1  Dexamethasone 12 mg Days 1-7         08/13/2019 -  Chemotherapy    Cycle 2 RCD      riTUXimab IV 375 mg/m2 on day 1,   cyclophosphamide 750 mg/m2 IV on day 1  Dexamethasone 12 mg Days 1-7 - dose reduced to 6mg  on day 4 due to headaches, drunken feeling     09/10/2019 -  Chemotherapy    Cycle 3 RCD      riTUXimab IV 375 mg/m2 on day 1,   cyclophosphamide 500 mg/m2 IV on day 1 (33% dose-reduction)  Dexamethasone 6 mg Days 1-7     Dose reductions due to neutropenia (1 week ANC 0.1) and symptoms              OTHER PAST MEDICAL HISTORY:     Patient Active Problem List   Diagnosis   ??? CLL (chronic lymphocytic leukemia) (CMS-HCC)   ??? Chronic migraine without aura   ??? 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 Left bundle branch block (LBBB)   ??? Hypercholesterolemia   ??? Chest pain, unspecified   ??? GERD (gastroesophageal reflux disease)   ??? Osteoporosis   ??? Needs flu shot   ??? BRBPR (bright red blood per rectum)   ??? Other autoimmune hemolytic anemia   ??? B-cell chronic lymphocytic leukemia (CMS-HCC)   ??? Aortic atherosclerosis (CMS-HCC)   ??? Diverticulitis of colon   ??? Epigastric pain   ??? Gastritis, Helicobacter pylori   ??? H/O adenomatous polyp of colon   ??? Healthcare maintenance   ??? Lymphadenopathy   ??? Migraine without aura and without status migrainosus, not intractable   ??? Syncope and collapse   ??? White matter disease   ??? Vasovagal syncope   ??? Chemotherapy-induced neutropenia (CMS-HCC)   ??? Coccyx pain   ??? Chemotherapy induced diarrhea   ??? Chronic right shoulder pain   ??? Chemotherapy-induced thrombocytopenia   ??? Thrombocytopenia (CMS-HCC)   ??? Dyspnea on exertion   ??? Idiopathic thrombocytopenic purpura (ITP) (CMS-HCC)   ??? Oral thrush   ??? Febrile  nonhemolytic transfusion reaction   ??? Transfusion reaction   ??? Exertional chest pain   ??? Transaminitis       ALLERGIES:     Baclofen    MEDICATIONS:       Outpatient Encounter Medications as of 10/07/2019   Medication Sig Dispense Refill   ??? amLODIPine (NORVASC) 5 MG tablet Take 1 tablet (5 mg total) by mouth daily. 30 tablet 2   ??? [EXPIRED] dexAMETHasone (DECADRON) 2 MG tablet Take 3 tablets (6 mg total) by mouth daily for 6 days. Start 09/11/19 18 tablet 0   ??? entecavir (BARACLUDE) 0.5 MG tablet Take 1 tablet (0.5 mg total) by mouth daily. 30 tablet 11   ??? fluticasone propion-salmeterol (ADVAIR) 100-50 mcg/dose diskus Inhale 2 puffs daily.      ??? loperamide (IMODIUM A-D) 2 mg tablet Take 2 mg by mouth Three (3) times a day as needed for diarrhea.      ??? LORazepam (ATIVAN) 0.5 MG tablet Take 1 tablet (0.5 mg total) by mouth nightly as needed for anxiety. 30 tablet 1   ??? metoprolol succinate (TOPROL-XL) 25 MG 24 hr tablet Take 1 tablet (25 mg total) by mouth daily. 30 tablet 3   ??? mirtazapine (REMERON) 7.5 MG tablet Take 7.5 mg by mouth nightly.     ??? multivitamin (TAB-A-VITE/THERAGRAN) per tablet Take 1 tablet by mouth daily.      ??? mv-mn-vitC-asbNa-Glu-Lys-hc124 (AIRBORNE, ASCORBATE SODIUM,) 333-1.7 mg Chew Chew 2 tablets Three (3) times a day as needed (cold prevention).     ??? pantoprazole (PROTONIX) 40 MG tablet Take by mouth daily.      ??? prochlorperazine (COMPAZINE) 10 MG tablet Take 1 tablet (10 mg total) by mouth every six (6) hours as needed (nausea). 30 tablet 2   ??? sertraline (ZOLOFT) 50 MG tablet Take 50 mg by mouth daily.  1   ??? tiZANidine (ZANAFLEX) 2 MG tablet Take 1 tablet (2 mg total) by mouth nightly as needed. 30 tablet 0     Facility-Administered Encounter Medications as of 10/07/2019   Medication Dose Route Frequency Provider Last Rate Last Admin   ??? tbo-filgrastim (GRANIX) injection 480 mcg  480 mcg Subcutaneous Once Vernie Murders, [place]           Physical Exam:  Vitals:    10/07/19 1256   BP: 132/79   Pulse: 94   Resp: 15   Temp: 36.6 ??C (97.9 ??F)   SpO2: 96%       General: Well-appearing older female in no acute distress  HEENT:  Clear sclera, conjunctiva, mask in place  LYMPH:  no palpable cervical, supraclavicular, axillary, or inguinal nodes  CARDAC: RRR, no M/R/Gs, trace peripheral edema  RESP: nonlabored, bilaterally CTA  GI: Soft, nontender, active bowel sounds, no hepatosplenomegaly  NEURO: A&Ox4, cautious slow gait, no focal deficits, 2 beats lateral nystagmus, fluid cerebellar movements (finger-nose, rapid alternating movements, heel-shin), strength equal bilaterally, negative Romberg, proprioception intact  PSYCH: appropriate  DERM: no visible rashes, lesions.   LINE: PORT    Labs:  Results for orders placed or performed in visit on 10/07/19   CBC w/ Differential   Result Value Ref Range    WBC 2.5 (L) 4.5 - 11.0 10*9/L    RBC 4.13 4.00 - 5.20 10*12/L    HGB 12.1 12.0 - 16.0 g/dL    HCT 19.1 47.8 - 29.5 %    MCV 87.9 80.0 - 100.0 fL    MCH 29.3 26.0 - 34.0 pg  MCHC 33.3 31.0 - 37.0 g/dL    RDW 09.8 (H) 11.9 - 15.0 %    MPV 9.4 7.0 - 10.0 fL    Platelet 43 (L) 150 - 440 10*9/L    Variable HGB Concentration Slight (A) Not Present Neutrophils % 58.8 %    Lymphocytes % 21.5 %    Monocytes % 11.9 %    Eosinophils % 3.8 %    Basophils % 0.5 %    Absolute Neutrophils 1.5 (L) 2.0 - 7.5 10*9/L    Absolute Lymphocytes 0.5 (L) 1.5 - 5.0 10*9/L    Absolute Monocytes 0.3 0.2 - 0.8 10*9/L    Absolute Eosinophils 0.1 0.0 - 0.4 10*9/L    Absolute Basophils 0.0 0.0 - 0.1 10*9/L    Large Unstained Cells 3 0 - 4 %    Microcytosis Slight (A) Not Present    Macrocytosis Slight (A) Not Present    Anisocytosis Moderate (A) Not Present    Hypochromasia Slight (A) Not Present

## 2019-10-07 NOTE — Unmapped (Signed)
[facility] Refill Coordination Note    Specialty Medication(s) to be Shipped:   Infectious Disease: entecavir    Other medication(s) to be shipped: n/a     Katrina Gould, DOB: [birth year]  Phone: [phone] (home) [phone] (work)      All above HIPAA information was verified with patient.     Was a Nurse, learning disability used for this call? No    Completed refill call assessment today to schedule patient's medication shipment from the [facility] [phone]).       Specialty medication(s) and dose(s) confirmed: Regimen is correct and unchanged.   Changes to medications: Katrina Gould reports no changes at this time.  Changes to insurance: No  Questions for the pharmacist: No    Confirmed patient received Welcome Packet with first shipment. The patient will receive a drug information handout for each medication shipped and additional FDA Medication Guides as required.       DISEASE/MEDICATION-SPECIFIC INFORMATION        N/A    SPECIALTY MEDICATION ADHERENCE     Medication Adherence    Patient reported X missed doses in the last month: 0  Specialty Medication: Entecavir 0.5mg   Patient is on additional specialty medications: No  Informant: patient                Entecavir 0.5 mg: 12 days of medicine on hand         SHIPPING     Shipping address confirmed in Epic.     Delivery Scheduled: Yes, Expected medication delivery date: 10/15/19.     Medication will be delivered via Next Day Courier to the prescription address in Epic WAM.    Katrina Gould   [facility] Specialty Technician

## 2019-10-08 NOTE — Unmapped (Signed)
1504 Pt arrived to unit for chemo treatment per orders. Pt resting in chair, call light in reach.  1830 chemo completed with no complications noted. Pt left ambulatory at 1843.

## 2019-10-08 NOTE — Unmapped (Signed)
Lab on 10/07/2019   Component Date Value Ref Range Status   ??? Sodium 10/07/2019 137  135 - 145 mmol/L Final   ??? Potassium 10/07/2019 4.3  3.5 - 5.0 mmol/L Final   ??? Chloride 10/07/2019 105  98 - 107 mmol/L Final   ??? Anion Gap 10/07/2019 9  7 - 15 mmol/L Final   ??? CO2 10/07/2019 23.0  22.0 - 30.0 mmol/L Final   ??? BUN 10/07/2019 15  7 - 21 mg/dL Final   ??? Creatinine 10/07/2019 0.73  0.60 - 1.00 mg/dL Final   ??? BUN/Creatinine Ratio 10/07/2019 21   Final   ??? EGFR CKD-EPI Non-African American,* 10/07/2019 78  >=60 mL/min/1.76m2 Final   ??? EGFR CKD-EPI African American, Fem* 10/07/2019 90  >=60 mL/min/1.45m2 Final   ??? Glucose 10/07/2019 101  70 - 179 mg/dL Final   ??? Calcium 16/01/9603 8.8  8.5 - 10.2 mg/dL Final   ??? Albumin 54/12/8117 3.7  3.5 - 5.0 g/dL Final   ??? Total Protein 10/07/2019 5.9* 6.5 - 8.3 g/dL Final   ??? Total Bilirubin 10/07/2019 0.7  0.0 - 1.2 mg/dL Final   ??? AST 14/78/2956 68* 14 - 38 U/L Final   ??? ALT 10/07/2019 67* <35 U/L Final   ??? Alkaline Phosphatase 10/07/2019 413* 38 - 126 U/L Final   ??? LDH 10/07/2019 533  338 - 610 U/L Final   ??? WBC 10/07/2019 2.5* 4.5 - 11.0 10*9/L Final   ??? RBC 10/07/2019 4.13  4.00 - 5.20 10*12/L Final   ??? HGB 10/07/2019 12.1  12.0 - 16.0 g/dL Final   ??? HCT 21/30/8657 36.3  36.0 - 46.0 % Final   ??? MCV 10/07/2019 87.9  80.0 - 100.0 fL Final   ??? MCH 10/07/2019 29.3  26.0 - 34.0 pg Final   ??? MCHC 10/07/2019 33.3  31.0 - 37.0 g/dL Final   ??? RDW 84/69/6295 19.5* 12.0 - 15.0 % Final   ??? MPV 10/07/2019 9.4  7.0 - 10.0 fL Final   ??? Platelet 10/07/2019 43* 150 - 440 10*9/L Final   ??? Variable HGB Concentration 10/07/2019 Slight* Not Present Final   ??? Neutrophils % 10/07/2019 58.8  % Final   ??? Lymphocytes % 10/07/2019 21.5  % Final   ??? Monocytes % 10/07/2019 11.9  % Final   ??? Eosinophils % 10/07/2019 3.8  % Final   ??? Basophils % 10/07/2019 0.5  % Final   ??? Absolute Neutrophils 10/07/2019 1.5* 2.0 - 7.5 10*9/L Final   ??? Absolute Lymphocytes 10/07/2019 0.5* 1.5 - 5.0 10*9/L Final   ??? Absolute Monocytes 10/07/2019 0.3  0.2 - 0.8 10*9/L Final   ??? Absolute Eosinophils 10/07/2019 0.1  0.0 - 0.4 10*9/L Final   ??? Absolute Basophils 10/07/2019 0.0  0.0 - 0.1 10*9/L Final   ??? Large Unstained Cells 10/07/2019 3  0 - 4 % Final   ??? Microcytosis 10/07/2019 Slight* Not Present Final   ??? Macrocytosis 10/07/2019 Slight* Not Present Final   ??? Anisocytosis 10/07/2019 Moderate* Not Present Final   ??? Hypochromasia 10/07/2019 Slight* Not Present Final

## 2019-10-09 LAB — HBV DNA QUANT: Hepatitis B virus DNA:PrThr:Pt:Bld:Ord:Probe.amp.tar: NOT DETECTED

## 2019-10-13 ENCOUNTER — Ambulatory Visit: Admit: 2019-10-13 | Discharge: 2019-10-14 | Payer: MEDICARE

## 2019-10-14 MED FILL — ENTECAVIR 0.5 MG TABLET: ORAL | 30 days supply | Qty: 30 | Fill #3

## 2019-10-14 MED FILL — ENTECAVIR 0.5 MG TABLET: 30 days supply | Qty: 30 | Fill #3 | Status: AC

## 2019-10-14 NOTE — Unmapped (Signed)
Hi,     Leanord Hawking contacted the Communication Center requesting to speak with the care team of Katrina Gould to discuss:    Patient wanted to know if she needs labs drawn this week.    Please contact [name] at [phone].    Thank you,   Kelli Hope  [facility] Communication [facility]   [phone]

## 2019-10-14 NOTE — Unmapped (Signed)
Triage RN returned call to advise that she does not need labs this week per Dr Lonni Fix note of 6/8.  Per Dr Lonni Fix, pt will have labs at 2 and 4 weeks.  The next appt is on 6/22.  Pt states understanding and appreciation for the call to verify.    Pt asked that Dr Lonni Fix be told that this last chemo made her really sick.  She stated that she was very nauseous but never really vomited.  But she does endorse dizziness, and staggering around with weakness  RN advised that should that happen again to make sure she is getting plenty of fluids and to let us know.  She remarked that she was not able to eat or drink due to the nausea.  Pt states understanding that she should notify us when she is having a difficult time.

## 2019-10-16 NOTE — Unmapped (Signed)
I called to review liver US - liver is on large end but otherwise no acute findings. LFTs appear to be improving. Will cont to follow.     No answer, left voicemail stating above.

## 2019-10-21 ENCOUNTER — Institutional Professional Consult (permissible substitution): Admit: 2019-10-21 | Discharge: 2019-10-22 | Payer: MEDICARE

## 2019-10-21 DIAGNOSIS — C9110 Chronic lymphocytic leukemia of B-cell type not having achieved remission: Principal | ICD-10-CM

## 2019-10-21 LAB — COMPREHENSIVE METABOLIC PANEL
ALBUMIN: 3.4 g/dL (ref 3.4–5.0)
ALKALINE PHOSPHATASE: 565 U/L — ABNORMAL HIGH (ref 46–116)
ALT (SGPT): 57 U/L — ABNORMAL HIGH (ref 10–49)
ANION GAP: 5 mmol/L (ref 3–11)
AST (SGOT): 65 U/L — ABNORMAL HIGH (ref <34)
BILIRUBIN TOTAL: 0.9 mg/dL (ref 0.3–1.2)
BLOOD UREA NITROGEN: 13 mg/dL (ref 9–23)
BUN / CREAT RATIO: 18
CHLORIDE: 108 mmol/L — ABNORMAL HIGH (ref 98–107)
CO2: 26.2 mmol/L (ref 20.0–31.0)
EGFR CKD-EPI AA FEMALE: 88 mL/min/{1.73_m2}
GLUCOSE RANDOM: 123 mg/dL (ref 70–179)
POTASSIUM: 4.1 mmol/L (ref 3.5–5.1)
PROTEIN TOTAL: 6 g/dL (ref 5.7–8.2)
SODIUM: 139 mmol/L (ref 135–145)

## 2019-10-21 LAB — CBC W/ AUTO DIFF
BASOPHILS ABSOLUTE COUNT: 0 10*9/L (ref 0.0–0.1)
BASOPHILS RELATIVE PERCENT: 2 %
EOSINOPHILS ABSOLUTE COUNT: 0.1 10*9/L (ref 0.0–0.7)
EOSINOPHILS RELATIVE PERCENT: 5.5 %
HEMATOCRIT: 35.6 % (ref 35.0–44.0)
HEMOGLOBIN: 12.1 g/dL (ref 12.0–15.5)
LYMPHOCYTES ABSOLUTE COUNT: 0.5 10*9/L — ABNORMAL LOW (ref 0.7–4.0)
LYMPHOCYTES RELATIVE PERCENT: 52.3 %
MEAN CORPUSCULAR HEMOGLOBIN: 28.5 pg (ref 26.0–34.0)
MEAN PLATELET VOLUME: 8.3 fL (ref 7.0–10.0)
MONOCYTES RELATIVE PERCENT: 24.2 %
NEUTROPHILS ABSOLUTE COUNT: 0.2 10*9/L — CL (ref 1.7–7.7)
NEUTROPHILS RELATIVE PERCENT: 16 %
NUCLEATED RED BLOOD CELLS: 0 /100{WBCs} (ref <=4)
PLATELET COUNT: 58 10*9/L — ABNORMAL LOW (ref 150–450)
RED BLOOD CELL COUNT: 4.23 10*12/L (ref 3.90–5.03)
RED CELL DISTRIBUTION WIDTH: 20.5 % — ABNORMAL HIGH (ref 12.0–15.0)
WBC ADJUSTED: 1 10*9/L — CL (ref 3.5–10.5)

## 2019-10-21 LAB — CHLORIDE: Chloride:SCnc:Pt:Ser/Plas:Qn:: 108 — ABNORMAL HIGH

## 2019-10-21 LAB — BASOPHILS ABSOLUTE COUNT: Basophils:NCnc:Pt:Bld:Qn:Automated count: 0

## 2019-10-21 LAB — SMEAR REVIEW

## 2019-10-21 NOTE — Unmapped (Signed)
Port accessed and flushed, labs drawn and sent.  Port flushed and heparinized, and deaccessed.  Pt. Ambulated to waiting room without difficulty.

## 2019-10-27 NOTE — Unmapped (Signed)
Contacted Katrina Gould and l/m to advise rationale for lab check today or tomorrow in [place]. Advised that scheduling team would be reaching out to find a convenient time for pt appointment. Contact info provided.

## 2019-10-27 NOTE — Unmapped (Signed)
Hi,     Patient contacted the Communication Center regarding the following:    - Returning missed call from NN. I just want to know why I need to have labs. Part of the message I could hear, part of it I couldn't.    Please contact patient at [phone].    Thanks in advance,    Laverna Peace  [facility]   [phone]

## 2019-10-27 NOTE — Unmapped (Signed)
R/c to Katrina Gould with rationale. Advised lab appt to check neutrophil status followed by Zarxio if indicated. Katrina Gould is agreeable to the plan and will await call from scheduling.

## 2019-10-28 ENCOUNTER — Ambulatory Visit: Admit: 2019-10-28 | Discharge: 2019-10-29 | Payer: MEDICARE

## 2019-10-28 DIAGNOSIS — D701 Agranulocytosis secondary to cancer chemotherapy: Principal | ICD-10-CM

## 2019-10-28 DIAGNOSIS — T451X5A Adverse effect of antineoplastic and immunosuppressive drugs, initial encounter: Secondary | ICD-10-CM

## 2019-10-28 DIAGNOSIS — C9110 Chronic lymphocytic leukemia of B-cell type not having achieved remission: Principal | ICD-10-CM

## 2019-10-28 LAB — CBC W/ AUTO DIFF
BASOPHILS ABSOLUTE COUNT: 0 10*9/L (ref 0.0–0.1)
BASOPHILS RELATIVE PERCENT: 1.1 %
EOSINOPHILS ABSOLUTE COUNT: 0.1 10*9/L (ref 0.0–0.7)
EOSINOPHILS RELATIVE PERCENT: 4.6 %
HEMATOCRIT: 35.9 % (ref 35.0–44.0)
HEMOGLOBIN: 12 g/dL (ref 12.0–15.5)
MEAN CORPUSCULAR HEMOGLOBIN CONC: 33.4 g/dL (ref 30.0–36.0)
MEAN CORPUSCULAR HEMOGLOBIN: 28.1 pg (ref 26.0–34.0)
MEAN CORPUSCULAR VOLUME: 84 fL (ref 82.0–98.0)
MEAN PLATELET VOLUME: 8.6 fL (ref 7.0–10.0)
MONOCYTES ABSOLUTE COUNT: 0.2 10*9/L (ref 0.1–1.0)
MONOCYTES RELATIVE PERCENT: 18 %
NEUTROPHILS ABSOLUTE COUNT: 0.5 10*9/L — ABNORMAL LOW (ref 1.7–7.7)
NEUTROPHILS RELATIVE PERCENT: 38 %
NUCLEATED RED BLOOD CELLS: 0 /100{WBCs} (ref <=4)
PLATELET COUNT: 40 10*9/L — ABNORMAL LOW (ref 150–450)
RED BLOOD CELL COUNT: 4.27 10*12/L (ref 3.90–5.03)
RED CELL DISTRIBUTION WIDTH: 19.1 % — ABNORMAL HIGH (ref 12.0–15.0)

## 2019-10-28 LAB — COMPREHENSIVE METABOLIC PANEL
ALBUMIN: 3.1 g/dL — ABNORMAL LOW (ref 3.4–5.0)
ALKALINE PHOSPHATASE: 688 U/L — ABNORMAL HIGH (ref 46–116)
ALT (SGPT): 79 U/L — ABNORMAL HIGH (ref 10–49)
ANION GAP: 7 mmol/L (ref 3–11)
AST (SGOT): 80 U/L — ABNORMAL HIGH (ref <34)
BILIRUBIN TOTAL: 0.7 mg/dL (ref 0.3–1.2)
BLOOD UREA NITROGEN: 14 mg/dL (ref 9–23)
CALCIUM: 9.2 mg/dL (ref 8.7–10.4)
CHLORIDE: 110 mmol/L — ABNORMAL HIGH (ref 98–107)
CO2: 23.8 mmol/L (ref 20.0–31.0)
CREATININE: 0.69 mg/dL (ref 0.50–0.80)
EGFR CKD-EPI AA FEMALE: >90 mL/min/{1.73_m2}
GLUCOSE RANDOM: 106 mg/dL (ref 70–179)
POTASSIUM: 3.5 mmol/L (ref 3.5–5.1)
PROTEIN TOTAL: 5.6 g/dL — ABNORMAL LOW (ref 5.7–8.2)
SODIUM: 141 mmol/L (ref 135–145)

## 2019-10-28 LAB — POTASSIUM: Potassium:SCnc:Pt:Ser/Plas:Qn:: 3.5

## 2019-10-28 LAB — NEUTROPHILS ABSOLUTE COUNT: Neutrophils:NCnc:Pt:Bld:Qn:Automated count: 0.5 — ABNORMAL LOW

## 2019-10-28 NOTE — Unmapped (Signed)
Received patient for lab check plus injection.    ANC 05 - indication for growth factor. Unable to obtain Zarxio 480mg , per MD Coombs ok to omit this dose and return next week for subsequent lab check.     Patient aware of and agreeable to plan, neutropenic precautions reinforced. Discharged ambulatory in no acute distress.

## 2019-10-28 NOTE — Unmapped (Signed)
10/28/19 confirmed today's appointment with Pat's son @ 8:33am.

## 2019-10-28 NOTE — Unmapped (Signed)
Infusion on 10/28/2019   Component Date Value Ref Range Status   ??? Sodium 10/28/2019 141  135 - 145 mmol/L Final   ??? Potassium 10/28/2019 3.5  3.5 - 5.1 mmol/L Final   ??? Chloride 10/28/2019 110* 98 - 107 mmol/L Final   ??? Anion Gap 10/28/2019 7  3 - 11 mmol/L Final   ??? CO2 10/28/2019 23.8  20.0 - 31.0 mmol/L Final   ??? BUN 10/28/2019 14  9 - 23 mg/dL Final   ??? Creatinine 10/28/2019 0.69  0.50 - 0.80 mg/dL Final   ??? BUN/Creatinine Ratio 10/28/2019 20   Final   ??? EGFR CKD-EPI Non-African American,* 10/28/2019 82  mL/min/1.70m2 Final   ??? EGFR CKD-EPI African American, Fem* 10/28/2019 >90  mL/min/1.49m2 Final   ??? Glucose 10/28/2019 106  70 - 179 mg/dL Final   ??? Calcium 16/01/9603 9.2  8.7 - 10.4 mg/dL Final   ??? Albumin 54/12/8117 3.1* 3.4 - 5.0 g/dL Final   ??? Total Protein 10/28/2019 5.6* 5.7 - 8.2 g/dL Final   ??? Total Bilirubin 10/28/2019 0.7  0.3 - 1.2 mg/dL Final   ??? AST 14/78/2956 80* <34 U/L Final   ??? ALT 10/28/2019 79* 10 - 49 U/L Final   ??? Alkaline Phosphatase 10/28/2019 688* 46 - 116 U/L Final   ??? WBC 10/28/2019 1.4* 3.5 - 10.5 10*9/L Final   ??? RBC 10/28/2019 4.27  3.90 - 5.03 10*12/L Final   ??? HGB 10/28/2019 12.0  12.0 - 15.5 g/dL Final   ??? HCT 21/30/8657 35.9  35.0 - 44.0 % Final   ??? MCV 10/28/2019 84.0  82.0 - 98.0 fL Final   ??? MCH 10/28/2019 28.1  26.0 - 34.0 pg Final   ??? MCHC 10/28/2019 33.4  30.0 - 36.0 g/dL Final   ??? RDW 84/69/6295 19.1* 12.0 - 15.0 % Final   ??? MPV 10/28/2019 8.6  7.0 - 10.0 fL Final   ??? Platelet 10/28/2019 40* 150 - 450 10*9/L Final   ??? nRBC 10/28/2019 0  <=4 /100 WBCs Final   ??? Neutrophils % 10/28/2019 38.0  % Final   ??? Lymphocytes % 10/28/2019 38.3  % Final   ??? Monocytes % 10/28/2019 18.0  % Final   ??? Eosinophils % 10/28/2019 4.6  % Final   ??? Basophils % 10/28/2019 1.1  % Final   ??? Absolute Neutrophils 10/28/2019 0.5* 1.7 - 7.7 10*9/L Final   ??? Absolute Lymphocytes 10/28/2019 0.5* 0.7 - 4.0 10*9/L Final   ??? Absolute Monocytes 10/28/2019 0.2  0.1 - 1.0 10*9/L Final   ??? Absolute Eosinophils 10/28/2019 0.1  0.0 - 0.7 10*9/L Final   ??? Absolute Basophils 10/28/2019 0.0  0.0 - 0.1 10*9/L Final   ??? Anisocytosis 10/28/2019 Moderate* Not Present Final

## 2019-10-31 NOTE — Unmapped (Signed)
[facility] Leukemia Clinic Visit    Patient Name: Katrina Gould  Patient Age: 79 y.o.  Encounter Date: 11/04/2019      Primary Care Provider:  Lauro Regulus, MD    Referring Physician:  Lauro Regulus, MD    REASON FOR VISIT:   79 y.o. female here to follow up for her CLL.    ASSESSMENT/PLAN     Katrina Gould is a 79 y.o. 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 Disease markers: 46,XX,der(4)t(4;4)(p14;q21). IGHV unmutated (0%, 3-33*01). B42m 5.05. No TP53 mutation. High-risk by CLL-IPI (6 points).    Platelet count downtrended despite the eltrombopag. BM bx 03/26/19 most consistent with ITP and CLL. After discussion with patient/son, stopped eltrombopag and started obinutuzumab 04/2019. Patient has now received 5 doses without improvement in platelets.     Given refractory nature of CLL-related ITP, initiated RCD. A total of 4 cycles were completed. She developed a transient improvement in platelets, however the courses, despite dose-reductions, were complicated by episodes of severe neutropenia and intolerance of steroid doses.     Today we discussed the above with the patient and recommended stopping this line of therapy. She is in agreement with this plan.  We discussed that, if/when her platelets drop <30K, we may consider an oral regimen (acalabrutinib). This would be offered with the goal of a more tolerable therapy that may provide benefit.      Plans and Recommendations:    1. CLL with CLL-related ITP  - STOP RCD  - follow cell counts monthly  - consider acalabrutinib if/when platelets are <30K  - monthly labs    2. 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 Neutropenia during nadirs of RCD therapy  - hold further cycles of RCD  - ANC has recovered to 1.0 on 7/6    4. Hx Platelet transfusion reaction: Patient has had febrile non-hemolytic transfusion reaction x 2. Also had chest pain during reaction possibly related to underlying CAD.  -- infuse platelets more slowly per Transfusion Medicine  -- follows with Cardiology  -- hold ASA while platelets < 50K  -- low threshold to reduce dexamethasone dose  -- monitor sxs    5. 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 Given risk of Hep B reactivation with RTX, on entecavir.   -- continue entecavir 0.5 mg daily.   - monitor HBV DNA PCR regularly - neg 5/12 and pending from today  - follow LFTs closely  -- Continue x 1 year s/p Ritux    6. Transaminitis: mild elevations. Liver ultrasound negative  --hold statin  - monitor    7. History ofChest pain: Has had past episodes of chest pain, particularly with platelet transfusions. Recent TTE with EF > 55%. Cardiac cath not performed 2/2 thrombocytopenia but stress test recommended when platelets improve.   -- Cardiology follow up.     8. DOE and occasional rapid heart rate  - stable and chronic    9. Arthritis  - continues to use topical CBD oil and this has helped  - May continue prn tylenol but should avoid NSAIDs  - knows to avoid oral CBD oil    10 Constipation  - senna daily and PRN miralax  ??  11. Health Maintenance:   - rec annual flu vaccine  -  recommend COVID-19 vaccine, but will need platelets consistently 50K or higher. We will defer until platelets more stable.     I personally spent 45 minutes face-to-face and non-face-to-face in the care of this patient, which includes all pre, intra, and post visit time on the date of service.    Langley Gauss, AGPCNP-BC  Nurse Practitioner  Hematology/Oncology  [facility]    Primary Oncolgist: Salli Real, MD      INTERVAL HISTORY     Patient here for follow up.    She reports another difficult cycle. She felt quite nauseous the first week of the cycle but never vomited. This resolved but she has felt run-down, without much energy. She did not have any fevers, bleeding or other complications.    She reports she is in agreement with the plan to stop this line of therapy.    Further ROS negative.       HEMATOLOGICAL / ONCOLOGICAL HISTORY:     Oncology History Overview Note   CLL    First seen in oncology clinic locally 03/31/16    Had a left axillary biopsy 03/15/16 that confirmed CLL/SLL: Flow showed monoclonal B-cell population with CD5, CD23, kappa light chain. IHC showed positive CD20 (diffuse), negative for cyclin D1.    No constitutional symptoms except some sweats    Labs: Hb 12.0, Hct 34.8, plt 147. WBC was 10.6 with ANC 2.5. ALC was 7.5K.    CLL FISH studies: normal panel. No cells with 11q, tri 12, 17p or 13q, and the 11;14 translocation also not detected.    Karyotype: 46,XX,der(4)t(4;4)(p14;q21)[3]/45,XX,dic(6;17)(q12;p11.2) (no loss of TP53 on FISH)    Molecular: No TP53 mutation    CLL-IPI: As per Lancet Oncol Vol 15 October 2014  Includes (1) TP53 status (no abnormalities vs. Del 17p and/or TP53 mutation - 4 points)=0, (2) IGHV mutation status (mut vs unmut - 2 points)=2 (3) serum B2-microglobulin (</=3.5 vs >3.5 - 2 points)=2 (4) clinical stage (Rai 0 vs Rai I-IV - 1 point)=1 and (5) age (</=65 vs >65 - 1 point)=1    Total: 6 points    Low risk (0-1 points)= 93.2% OS at 5 years  Int risk (2-3 points)= 79.3%  High risk (4-6 points)= 63.3%    Very high risk (7-10 points)= 23.3%     Received rituximab weekly x4 from 09/14/17 to 10/05/17  - For ITP 2/t CLL vs. CLL  - No response    Started ven/obi regimen on 10/23/17  Baseline counts:  WBC 45.9  ALC 2.6  Hb 12.5  Plt 10    03/22/18: BM bx without overt CLL, MRD testing at [facility] revealed CD5-positive, kappa light chain-restricted B-cell  population is detected. MRD = 0.32%.    05/15/18: Repeated BM bx given persistent low counts - ~1% CLL in otherwise normal appearing marrow. Myeloid mutation panel negative. Karyotype: 45,XX,dic(6;17)(q12;p11.2)[2]/46,XX[18].       CLL (chronic lymphocytic leukemia) (CMS-HCC)   03/15/2016 Initial Diagnosis    CLL (chronic lymphocytic leukemia) (RAF-HCC)     09/14/2017 - 10/05/2017 Chemotherapy    Rituximab weekly x4  No response     09/14/2017 - 10/05/2017 Chemotherapy    Rituximab x 4     10/23/2017 -  Chemotherapy    Began ven/obi     03/19/2018 Adverse Reaction    Obinutuzumab held given persistent thrombocytopenia. Marrow performed on 03/22/18, patient with no overt CLL (0.32% by MRD flow testing from [facility]). Do not plan to give 6th cycle of obi (unless counts  recover in a reasonable timeframe).     03/22/2018 Biopsy    Bone marrow, right iliac, aspiration and biopsy  -  Normocellular bone marrow (30%) with erythroid-predominant trilineage hematopoiesis   -  Suspicious for low level (<5%) residual involvement by chronic lymphocytic leukemia in a background of reactive lymphoid aggregates  -  Routine cytogenetic results reveal a normal karyotype; FISH (CLL panel) results are normal in a limited analysis; see details below (case [id number]).  -  Flow cytometric CLL MRD results reveal a CD5-positive, kappa light chain-restricted B-cell population is detected; MRD = 0.32%; see details in the Referral Testing Report in the Media Tab.     04/09/2018 Adverse Reaction    ANC down to 0.2. Held venetoclax (was previously at 200 mg daily).     05/15/2018 Biopsy    Bone marrow, right iliac, aspiration and biopsy  -  Mildly hypercellular bone marrow (40%) with trilineage hematopoiesis and persistent low level (less than 1% of cells) involvement by chronic lymphocytic leukemia by flow cytometric analysis  -  Numerous reactive lymphoid aggregates  -  No significant morphologic dyspoiesis or increase in blasts  -  Routine cytogenetic results reveal an abnormal karyotype; FISH (CLL panel) results are normal; see details below (case [id number]).  -  Myeloid mutation panel results reveal no mutations were identified in the 34 genes analyzed; see details below (case [id number]).     03/26/2019 Biopsy    Bone marrow, right iliac, aspiration and biopsy  - Slightly hypercellular bone marrow (40%) with persistent low-level involvement by patient's known chronic lymphocytic leukemia, representing approximately 5% of marrow cells by PAX5 immunohistochemical stain.  -  Increased megakaryocytes identified on bone marrow aspirate smears (see Comment)  -  No significant morphologic dyspoiesis or increase in blasts.  -  Routine cytogenetic analysis is pending.  -  Myeloid mutation panel is pending.    Although not a specific finding, increased megakaryocytes can be seen in the setting of immune-mediated thrombocytopenia (ITP).     08/13/2019 -  Chemotherapy    Cycle 2 RCD      riTUXimab IV 375 mg/m2 on day 1,   cyclophosphamide 750 mg/m2 IV on day 1  Dexamethasone 12 mg Days 1-7 - dose reduced to 6mg  on day 4 due to headaches, drunken feeling     09/10/2019 -  Chemotherapy    Cycle 3 RCD      riTUXimab IV 375 mg/m2 on day 1,   cyclophosphamide 500 mg/m2 IV on day 1 (33% dose-reduction)  Dexamethasone 6 mg Days 1-7     Dose reductions due to neutropenia (1 week ANC 0.1) and symptoms     B-cell chronic lymphocytic leukemia (CMS-HCC)   09/05/2017 - 10/11/2017 Chemotherapy    Chemotherapy Treatment    Treatment Goal Control   Line of Treatment [No plan line of treatment]   Plan Name OP RITUXIMAB    Start Date 09/14/2017   End Date 10/05/2017   Provider Pernell Dupre, MD   Chemotherapy riTUXimab (RITUXAN) 712.5 mg in sodium chloride (NS) 0.9 % 500 mL IVPB, 375 mg/m2 = 712.5 mg, Intravenous, Once, 1 of 1 cycle  Administration: 712.5 mg (09/14/2017)  riTUXimab (RITUXAN) 712.5 mg in sodium chloride (NS) 0.9 % 250 mL rapid infusion, 375 mg/m2 = 712.5 mg, Intravenous, Once, 1 of 1 cycle  Administration: 712.5 mg (09/21/2017), 712.5 mg (09/28/2017), 712.5 mg (10/05/2017)        10/16/2017 Initial Diagnosis    Chronic lymphocytic leukemia, Rai  stage IV (CMS-HCC)     10/23/2017 - 03/18/2018 Chemotherapy    OP OBINUTUZUMAB AND VENETOCLAX  chlorambucil 0.5 mg/kg PO on days 1, 15, obinutuzumab 100 mg IV on day 1, obinutuzumab 900 mg IV on day 2, obinutuzumab 1,000 mg IV on days 8,15 on Cycle 1, then obinutuzumab 1,000 mg IV on day 1 for sequent cycles, every 28 days.     04/07/2019 - 06/30/2019 Chemotherapy    OP CLL OBINUTUZUMAB  chlorambucil 0.5 mg/kg PO on days 1, 15, obinutuzumab 100 mg IV on day 1, obinutuzumab 900 mg IV on day 2, obinutuzumab 1,000 mg IV on days 8,15 on Cycle 1, then obinutuzumab 1,000 mg IV on day 1 for sequent cycles, every 28 days.     07/16/2019 -  Chemotherapy    Cycle 1 RCD     riTUXimab IV 375 mg/m2 on day 1,   cyclophosphamide 750 mg/m2 IV on day 1  Dexamethasone 12 mg Days 1-7         08/13/2019 -  Chemotherapy    Cycle 2 RCD      riTUXimab IV 375 mg/m2 on day 1,   cyclophosphamide 750 mg/m2 IV on day 1  Dexamethasone 12 mg Days 1-7 - dose reduced to 6mg  on day 4 due to headaches, drunken feeling     09/10/2019 -  Chemotherapy    Cycle 3 RCD      riTUXimab IV 375 mg/m2 on day 1,   cyclophosphamide 500 mg/m2 IV on day 1 (33% dose-reduction)  Dexamethasone 6 mg Days 1-7     Dose reductions due to neutropenia (1 week ANC 0.1) and symptoms              OTHER PAST MEDICAL HISTORY:     Patient Active Problem List   Diagnosis   ??? CLL (chronic lymphocytic leukemia) (CMS-HCC)   ??? Chronic migraine without aura   ??? 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 Left bundle branch block (LBBB)   ??? Hypercholesterolemia   ??? Chest pain, unspecified   ??? GERD (gastroesophageal reflux disease)   ??? Osteoporosis   ??? Needs flu shot   ??? BRBPR (bright red blood per rectum)   ??? Other autoimmune hemolytic anemia   ??? B-cell chronic lymphocytic leukemia (CMS-HCC)   ??? Aortic atherosclerosis (CMS-HCC)   ??? Diverticulitis of colon   ??? Epigastric pain   ??? Gastritis, Helicobacter pylori   ??? H/O adenomatous polyp of colon   ??? Healthcare maintenance   ??? Lymphadenopathy   ??? Migraine without aura and without status migrainosus, not intractable   ??? Syncope and collapse   ??? White matter disease   ??? Vasovagal syncope   ??? Chemotherapy-induced neutropenia (CMS-HCC)   ??? Coccyx pain   ??? Chemotherapy induced diarrhea   ??? Chronic right shoulder pain   ??? Chemotherapy-induced thrombocytopenia   ??? Thrombocytopenia (CMS-HCC)   ??? Dyspnea on exertion   ??? Idiopathic thrombocytopenic purpura (ITP) (CMS-HCC)   ??? Oral thrush   ??? Febrile nonhemolytic transfusion reaction   ??? Transfusion reaction   ??? Exertional chest pain   ??? Transaminitis       ALLERGIES:     Baclofen    MEDICATIONS:       Outpatient Encounter Medications as of 11/04/2019   Medication Sig Dispense Refill   ??? amLODIPine (NORVASC) 5 MG tablet Take 1 tablet (5 mg total) by mouth daily. 30 tablet 2   ??? entecavir (BARACLUDE) 0.5 MG tablet  Take 1 tablet (0.5 mg total) by mouth daily. 30 tablet 11   ??? fluticasone propion-salmeterol (ADVAIR) 100-50 mcg/dose diskus Inhale 2 puffs daily.      ??? loperamide (IMODIUM A-D) 2 mg tablet Take 2 mg by mouth Three (3) times a day as needed for diarrhea.      ??? LORazepam (ATIVAN) 0.5 MG tablet Take 1 tablet (0.5 mg total) by mouth nightly as needed for anxiety. 30 tablet 1   ??? metoprolol succinate (TOPROL-XL) 25 MG 24 hr tablet Take 1 tablet (25 mg total) by mouth daily. 30 tablet 3   ??? mirtazapine (REMERON) 7.5 MG tablet Take 7.5 mg by mouth nightly.     ??? multivitamin (TAB-A-VITE/THERAGRAN) per tablet Take 1 tablet by mouth daily.      ??? mv-mn-vitC-asbNa-Glu-[id number]-hc124 (AIRBORNE, ASCORBATE SODIUM,) 333-1.7 mg Chew Chew 2 tablets Three (3) times a day as needed (cold prevention).     ??? pantoprazole (PROTONIX) 40 MG tablet Take by mouth daily.      ??? prochlorperazine (COMPAZINE) 10 MG tablet Take 1 tablet (10 mg total) by mouth every six (6) hours as needed (nausea). 30 tablet 2   ??? sertraline (ZOLOFT) 50 MG tablet Take 50 mg by mouth daily.  1   ??? tiZANidine (ZANAFLEX) 2 MG tablet Take 1 tablet (2 mg total) by mouth nightly as needed. 30 tablet 0     No facility-administered encounter medications on file as of 11/04/2019. Physical Exam:  Vitals:    11/04/19 1434   BP: 116/66   Pulse: 83   Resp: 16   Temp: 36.7 ??C (98 ??F)   SpO2: 96%       General: Well-appearing older female in no acute distress  HEENT:  Clear sclera, conjunctiva, mask in place  LYMPH:  no palpable cervical, supraclavicular, axillary, or inguinal nodes  CARDAC: RRR, no M/R/Gs, trace peripheral edema  RESP: nonlabored, bilaterally CTA  GI: Soft, nontender, active bowel sounds, no hepatosplenomegaly  PSYCH: appropriate  DERM: no visible rashes, lesions.   LINE: PORT    Labs:  Results for orders placed or performed in visit on 11/04/19   Comprehensive metabolic panel   Result Value Ref Range    Sodium 137 135 - 145 mmol/L    Potassium 4.1 3.5 - 5.0 mmol/L    Chloride 108 (H) 98 - 107 mmol/L    Anion Gap 6 (L) 7 - 15 mmol/L    CO2 23.0 22.0 - 30.0 mmol/L    BUN 17 7 - 21 mg/dL    Creatinine 4.40 3.47 - 1.00 mg/dL    BUN/Creatinine Ratio 24     EGFR CKD-EPI Non-African American, Female 79 >=60 mL/min/1.62m2    EGFR CKD-EPI African American, Female >90 >=60 mL/min/1.70m2    Glucose 114 70 - 179 mg/dL    Calcium 9.0 8.5 - 42.5 mg/dL    Albumin 3.5 3.5 - 5.0 g/dL    Total Protein 5.9 (L) 6.5 - 8.3 g/dL    Total Bilirubin 0.8 0.0 - 1.2 mg/dL    AST 89 (H) 14 - 38 U/L    ALT 66 (H) <35 U/L    Alkaline Phosphatase 767 (H) 38 - 126 U/L   Lactate dehydrogenase   Result Value Ref Range    LDH 624 (H) 338 - 610 U/L   CBC w/ Differential   Result Value Ref Range    WBC 2.0 (L) 4.5 - 11.0 10*9/L    RBC 4.25 4.00 - 5.20 10*12/L  HGB 12.1 12.0 - 16.0 g/dL    HCT 65.7 84.6 - 96.2 %    MCV 88.8 80.0 - 100.0 fL    MCH 28.5 26.0 - 34.0 pg    MCHC 32.1 31.0 - 37.0 g/dL    RDW 95.2 (H) 84.1 - 15.0 %    MPV 10.9 (H) 7.0 - 10.0 fL    Platelet 40 (L) 150 - 440 10*9/L    Variable HGB Concentration Slight (A) Not Present    Neutrophils % 47.8 %    Lymphocytes % 35.3 %    Monocytes % 10.6 %    Eosinophils % 2.4 %    Basophils % 1.0 %    Absolute Neutrophils 1.0 (L) 2.0 - 7.5 10*9/L    Absolute Lymphocytes 0.7 (L) 1.5 - 5.0 10*9/L    Absolute Monocytes 0.2 0.2 - 0.8 10*9/L    Absolute Eosinophils 0.1 0.0 - 0.4 10*9/L    Absolute Basophils 0.0 0.0 - 0.1 10*9/L    Large Unstained Cells 3 0 - 4 %    Microcytosis Slight (A) Not Present    Macrocytosis Slight (A) Not Present    Anisocytosis Slight (A) Not Present    Hypochromasia Moderate (A) Not Present

## 2019-11-04 ENCOUNTER — Other Ambulatory Visit: Admit: 2019-11-04 | Discharge: 2019-11-04 | Payer: MEDICARE

## 2019-11-04 ENCOUNTER — Ambulatory Visit: Admit: 2019-11-04 | Discharge: 2019-11-04 | Payer: MEDICARE

## 2019-11-04 ENCOUNTER — Ambulatory Visit: Admit: 2019-11-04 | Discharge: 2019-11-04 | Payer: MEDICARE | Attending: Adult Health | Primary: Adult Health

## 2019-11-04 DIAGNOSIS — D693 Immune thrombocytopenic purpura: Principal | ICD-10-CM

## 2019-11-04 DIAGNOSIS — C9110 Chronic lymphocytic leukemia of B-cell type not having achieved remission: Principal | ICD-10-CM

## 2019-11-04 DIAGNOSIS — D5919 Other autoimmune hemolytic anemia: Principal | ICD-10-CM

## 2019-11-04 LAB — COMPREHENSIVE METABOLIC PANEL
ALBUMIN: 3.5 g/dL (ref 3.5–5.0)
ALT (SGPT): 66 U/L — ABNORMAL HIGH (ref <35)
ANION GAP: 6 mmol/L — ABNORMAL LOW (ref 7–15)
AST (SGOT): 89 U/L — ABNORMAL HIGH (ref 14–38)
BILIRUBIN TOTAL: 0.8 mg/dL (ref 0.0–1.2)
BLOOD UREA NITROGEN: 17 mg/dL (ref 7–21)
BUN / CREAT RATIO: 24
CALCIUM: 9 mg/dL (ref 8.5–10.2)
CHLORIDE: 108 mmol/L — ABNORMAL HIGH (ref 98–107)
CO2: 23 mmol/L (ref 22.0–30.0)
CREATININE: 0.72 mg/dL (ref 0.60–1.00)
EGFR CKD-EPI AA FEMALE: >90 mL/min/{1.73_m2} (ref >=60)
EGFR CKD-EPI NON-AA FEMALE: 79 mL/min/{1.73_m2} (ref >=60)
GLUCOSE RANDOM: 114 mg/dL (ref 70–179)
POTASSIUM: 4.1 mmol/L (ref 3.5–5.0)
PROTEIN TOTAL: 5.9 g/dL — ABNORMAL LOW (ref 6.5–8.3)
SODIUM: 137 mmol/L (ref 135–145)

## 2019-11-04 LAB — CBC W/ AUTO DIFF
BASOPHILS ABSOLUTE COUNT: 0 10*9/L (ref 0.0–0.1)
EOSINOPHILS ABSOLUTE COUNT: 0.1 10*9/L (ref 0.0–0.4)
EOSINOPHILS RELATIVE PERCENT: 2.4 %
HEMATOCRIT: 37.7 % (ref 36.0–46.0)
HEMOGLOBIN: 12.1 g/dL (ref 12.0–16.0)
LARGE UNSTAINED CELLS: 3 % (ref 0–4)
LYMPHOCYTES ABSOLUTE COUNT: 0.7 10*9/L — ABNORMAL LOW (ref 1.5–5.0)
LYMPHOCYTES RELATIVE PERCENT: 35.3 %
MEAN CORPUSCULAR HEMOGLOBIN CONC: 32.1 g/dL (ref 31.0–37.0)
MEAN CORPUSCULAR HEMOGLOBIN: 28.5 pg (ref 26.0–34.0)
MEAN CORPUSCULAR VOLUME: 88.8 fL (ref 80.0–100.0)
MEAN PLATELET VOLUME: 10.9 fL — ABNORMAL HIGH (ref 7.0–10.0)
MONOCYTES ABSOLUTE COUNT: 0.2 10*9/L (ref 0.2–0.8)
NEUTROPHILS ABSOLUTE COUNT: 1 10*9/L — ABNORMAL LOW (ref 2.0–7.5)
NEUTROPHILS RELATIVE PERCENT: 47.8 %
PLATELET COUNT: 40 10*9/L — ABNORMAL LOW (ref 150–440)
RED CELL DISTRIBUTION WIDTH: 17.9 % — ABNORMAL HIGH (ref 12.0–15.0)
WBC ADJUSTED: 2 10*9/L — ABNORMAL LOW (ref 4.5–11.0)

## 2019-11-04 LAB — HEMOGLOBIN: Hemoglobin:MCnc:Pt:Bld:Qn:: 12.1

## 2019-11-04 LAB — AST (SGOT): Aspartate aminotransferase:CCnc:Pt:Ser/Plas:Qn:: 89 — ABNORMAL HIGH

## 2019-11-04 LAB — LACTATE DEHYDROGENASE: Lactate dehydrogenase:CCnc:Pt:Ser/Plas:Qn:Reaction: pyruvate to lactate: 624 — ABNORMAL HIGH

## 2019-11-04 NOTE — Unmapped (Signed)
We will hold any therapy for now.      We will see you back in a month.    We may recommend therapy (likely a pill) if your platelets stay below 30 in the future.    Please call us if you experience:    1. Nausea or vomiting not controlled by nausea medicines  2. Fever of 100.5 F or higher, shaking chills, drenching night sweats.  3. Uncontrolled pain  4. Any rapidly enlarging lymph node or mass  5. Unintentional weight loss  6. Any other concerning symptom     MyChart Messages  For your safety and best care, please DO NOT use MyChart messages to report symptoms. (Symptoms should be reported by calling the nurse triage line). Please use MyChart for non-urgent matters such as general questions, non-urgent prescription refills, or non-urgent scheduling issues.     ?? Please do not use MyChart for URGENT messages, as messages are only checked during regular business hours.     ?? Please note that MyChart messages may be routed a central pool and one of your provider???s team members will get back to you.  - Expect up to 3 business days for response     If you have any other questions, please do not hesitate to contact us.    Nurse Navigator: Milinda Antis, RN    Nurse Practitioner: Langley Gauss    For health related questions Monday through Friday 8 AM??? 5 PM : please call the office at [phone] and ask to speak with a nurse.  For appointment changes call: Main Clinic [phone].  Toll free number is [phone].    On Nights, Weekends and Holidays:  Call [phone] and ask for the adult hematologist/oncologist on call.      [facility]  [address]  [place], [place] [address]  [email or web address]    Results for orders placed or performed in visit on 11/04/19   Comprehensive metabolic panel   Result Value Ref Range    Sodium 137 135 - 145 mmol/L    Potassium 4.1 3.5 - 5.0 mmol/L    Chloride 108 (H) 98 - 107 mmol/L    Anion Gap 6 (L) 7 - 15 mmol/L    CO2 23.0 22.0 - 30.0 mmol/L    BUN 17 7 - 21 mg/dL Creatinine 3.66 4.40 - 1.00 mg/dL    BUN/Creatinine Ratio 24     EGFR CKD-EPI Non-African American, Female 79 >=60 mL/min/1.68m2    EGFR CKD-EPI African American, Female >90 >=60 mL/min/1.18m2    Glucose 114 70 - 179 mg/dL    Calcium 9.0 8.5 - 34.7 mg/dL    Albumin 3.5 3.5 - 5.0 g/dL    Total Protein 5.9 (L) 6.5 - 8.3 g/dL    Total Bilirubin 0.8 0.0 - 1.2 mg/dL    AST 89 (H) 14 - 38 U/L    ALT 66 (H) <35 U/L    Alkaline Phosphatase 767 (H) 38 - 126 U/L   Lactate dehydrogenase   Result Value Ref Range    LDH 624 (H) 338 - 610 U/L   CBC w/ Differential   Result Value Ref Range    WBC 2.0 (L) 4.5 - 11.0 10*9/L    RBC 4.25 4.00 - 5.20 10*12/L    HGB 12.1 12.0 - 16.0 g/dL    HCT 42.5 95.6 - 38.7 %    MCV 88.8 80.0 - 100.0 fL    MCH 28.5 26.0 - 34.0 pg  MCHC 32.1 31.0 - 37.0 g/dL    RDW 47.4 (H) 25.9 - 15.0 %    MPV 10.9 (H) 7.0 - 10.0 fL    Platelet 40 (L) 150 - 440 10*9/L    Variable HGB Concentration Slight (A) Not Present    Neutrophils % 47.8 %    Lymphocytes % 35.3 %    Monocytes % 10.6 %    Eosinophils % 2.4 %    Basophils % 1.0 %    Absolute Neutrophils 1.0 (L) 2.0 - 7.5 10*9/L    Absolute Lymphocytes 0.7 (L) 1.5 - 5.0 10*9/L    Absolute Monocytes 0.2 0.2 - 0.8 10*9/L    Absolute Eosinophils 0.1 0.0 - 0.4 10*9/L    Absolute Basophils 0.0 0.0 - 0.1 10*9/L    Large Unstained Cells 3 0 - 4 %    Microcytosis Slight (A) Not Present    Macrocytosis Slight (A) Not Present    Anisocytosis Slight (A) Not Present    Hypochromasia Moderate (A) Not Present

## 2019-11-11 NOTE — Unmapped (Signed)
[facility] Refill Coordination Note    Specialty Medication(s) to be Shipped:   Hematology/Oncology: Entecavir 0.5mg     Other medication(s) to be shipped: n/a     Katrina Gould, DOB: [birth year]  Phone: [phone] (home) [phone] (work)      All above HIPAA information was verified with patient.     Was a Nurse, learning disability used for this call? No    Completed refill call assessment today to schedule patient's medication shipment from the [facility] [phone]).       Specialty medication(s) and dose(s) confirmed: Regimen is correct and unchanged.   Changes to medications: Katrina Gould Reports stopping the following medications: Liquid chemo for 1 month.  Starts back up in August.  Changes to insurance: No  Questions for the pharmacist: No    Confirmed patient received Welcome Packet with first shipment. The patient will receive a drug information handout for each medication shipped and additional FDA Medication Guides as required.       DISEASE/MEDICATION-SPECIFIC INFORMATION        N/A    SPECIALTY MEDICATION ADHERENCE     Medication Adherence    Patient reported X missed doses in the last month: 0  Specialty Medication: Entecavir 0.5mg   Patient is on additional specialty medications: No  Informant: patient                Entecavir 0.5 mg: 7 days of medicine on hand         SHIPPING     Shipping address confirmed in Epic.     Delivery Scheduled: Yes, Expected medication delivery date: 11/13/19.     Medication will be delivered via Next Day Courier to the prescription address in Epic [place].    Wyatt Mage M Elisabeth Cara   [facility] Specialty Technician

## 2019-11-12 MED FILL — ENTECAVIR 0.5 MG TABLET: 30 days supply | Qty: 30 | Fill #4 | Status: AC

## 2019-11-12 MED FILL — ENTECAVIR 0.5 MG TABLET: ORAL | 30 days supply | Qty: 30 | Fill #4

## 2019-11-27 MED ORDER — FUROSEMIDE 20 MG TABLET
ORAL | 0 days
Start: 2019-11-27 — End: 2020-11-26

## 2019-12-03 NOTE — Unmapped (Signed)
Contacted Ms. Vaux per Langley Gauss and l/m inquiring as to whether pt has BP cuff at home. Advised that per discussion with Gregary Signs re: amlodipine refill, we would like pt to check BP daily at home prior to her visit with [facility] on 8/9. Asked for return call to [phone].

## 2019-12-05 NOTE — Unmapped (Signed)
[facility] Refill Coordination Note    Specialty Medication(s) to be Shipped:   Hematology/Oncology: Entecavir 0.5mg     Other medication(s) to be shipped: No additional medications requested for fill at this time     Katrina Gould, DOB: 06-07-1940  Phone: [phone] (home) [phone] (work)      All above HIPAA information was verified with patient.     Was a Nurse, learning disability used for this call? No    Completed refill call assessment today to schedule patient's medication shipment from the [facility] [phone]).       Specialty medication(s) and dose(s) confirmed: Regimen is correct and unchanged.   Changes to medications: Katrina Gould reports no changes at this time.  Changes to insurance: No  Questions for the pharmacist: No    Confirmed patient received Welcome Packet with first shipment. The patient will receive a drug information handout for each medication shipped and additional FDA Medication Guides as required.       DISEASE/MEDICATION-SPECIFIC INFORMATION        N/A    SPECIALTY MEDICATION ADHERENCE     Medication Adherence    Patient reported X missed doses in the last month: 0  Specialty Medication: Entecavir 0.5mg   Patient is on additional specialty medications: No  Informant: patient                Entecavir 0.5 mg: 14 days of medicine on hand          SHIPPING     Shipping address confirmed in Epic.     Delivery Scheduled: Yes, Expected medication delivery date: 12/17/19.     Medication will be delivered via Same Day Courier to the prescription address in Epic [place].    Wyatt Mage M Elisabeth Cara   [facility] Specialty Technician

## 2019-12-08 ENCOUNTER — Other Ambulatory Visit: Admit: 2019-12-08 | Discharge: 2019-12-09 | Payer: MEDICARE

## 2019-12-08 ENCOUNTER — Ambulatory Visit: Admit: 2019-12-08 | Discharge: 2019-12-09 | Payer: MEDICARE | Attending: Adult Health | Primary: Adult Health

## 2019-12-08 DIAGNOSIS — C9110 Chronic lymphocytic leukemia of B-cell type not having achieved remission: Principal | ICD-10-CM

## 2019-12-08 LAB — CBC W/ AUTO DIFF
BASOPHILS ABSOLUTE COUNT: 0 10*9/L (ref 0.0–0.1)
BASOPHILS RELATIVE PERCENT: 0.4 %
EOSINOPHILS ABSOLUTE COUNT: 0.1 10*9/L (ref 0.0–0.4)
EOSINOPHILS RELATIVE PERCENT: 3.3 %
HEMATOCRIT: 34.1 % — ABNORMAL LOW (ref 36.0–46.0)
HEMOGLOBIN: 11.4 g/dL — ABNORMAL LOW (ref 12.0–16.0)
LARGE UNSTAINED CELLS: 2 % (ref 0–4)
LYMPHOCYTES ABSOLUTE COUNT: 0.5 10*9/L — ABNORMAL LOW (ref 1.5–5.0)
LYMPHOCYTES RELATIVE PERCENT: 24.7 %
MEAN CORPUSCULAR HEMOGLOBIN CONC: 33.6 g/dL (ref 31.0–37.0)
MEAN CORPUSCULAR HEMOGLOBIN: 28.7 pg (ref 26.0–34.0)
MEAN CORPUSCULAR VOLUME: 85.4 fL (ref 80.0–100.0)
MEAN PLATELET VOLUME: 12.6 fL — ABNORMAL HIGH (ref 7.0–10.0)
MONOCYTES ABSOLUTE COUNT: 0.2 10*9/L (ref 0.2–0.8)
MONOCYTES RELATIVE PERCENT: 10 %
NEUTROPHILS RELATIVE PERCENT: 59.3 %
PLATELET COUNT: 39 10*9/L — ABNORMAL LOW (ref 150–440)
RED BLOOD CELL COUNT: 3.99 10*12/L — ABNORMAL LOW (ref 4.00–5.20)
RED CELL DISTRIBUTION WIDTH: 17.1 % — ABNORMAL HIGH (ref 12.0–15.0)
WBC ADJUSTED: 2 10*9/L — ABNORMAL LOW (ref 4.5–11.0)

## 2019-12-08 LAB — COMPREHENSIVE METABOLIC PANEL
ALBUMIN: 3.2 g/dL — ABNORMAL LOW (ref 3.4–5.0)
ALKALINE PHOSPHATASE: 699 U/L — ABNORMAL HIGH (ref 46–116)
ALT (SGPT): 62 U/L — ABNORMAL HIGH (ref 10–49)
AST (SGOT): 69 U/L — ABNORMAL HIGH (ref <=34)
BILIRUBIN TOTAL: 0.6 mg/dL (ref 0.3–1.2)
BLOOD UREA NITROGEN: 18 mg/dL (ref 9–23)
BUN / CREAT RATIO: 21
CHLORIDE: 104 mmol/L (ref 98–107)
CO2: 25 mmol/L (ref 20.0–31.0)
CREATININE: 0.85 mg/dL — ABNORMAL HIGH
EGFR CKD-EPI AA FEMALE: 74 mL/min/{1.73_m2} (ref >=60)
EGFR CKD-EPI NON-AA FEMALE: 64 mL/min/{1.73_m2} (ref >=60)
GLUCOSE RANDOM: 129 mg/dL (ref 70–179)
POTASSIUM: 4 mmol/L (ref 3.4–4.5)
PROTEIN TOTAL: 5.9 g/dL (ref 5.7–8.2)
SODIUM: 136 mmol/L (ref 135–145)

## 2019-12-08 LAB — MEAN CORPUSCULAR VOLUME: Erythrocyte mean corpuscular volume:EntVol:Pt:RBC:Qn:Automated count: 85.4

## 2019-12-08 LAB — CO2: Carbon dioxide:SCnc:Pt:Ser/Plas:Qn:: 25

## 2019-12-08 NOTE — Unmapped (Signed)
Please return in 2 months for a follow up visit and labs.    Please call us if you experience:    1. Nausea or vomiting not controlled by nausea medicines  2. Fever of 100.5 F or higher, shaking chills, drenching night sweats.  3. Uncontrolled pain  4. Any rapidly enlarging lymph node or mass  5. Unintentional weight loss  6. Any other concerning symptom     MyChart Messages  For your safety and best care, please DO NOT use MyChart messages to report symptoms. (Symptoms should be reported by calling the nurse triage line). Please use MyChart for non-urgent matters such as general questions, non-urgent prescription refills, or non-urgent scheduling issues.     ?? Please do not use MyChart for URGENT messages, as messages are only checked during regular business hours.     ?? Please note that MyChart messages may be routed a central pool and one of your provider???s team members will get back to you.  - Expect up to 3 business days for response     If you have any other questions, please do not hesitate to contact us.    Nurse Navigator: Milinda Antis, RN    Nurse Practitioner: Langley Gauss    For health related questions Monday through Friday 8 AM??? 5 PM : please call the office at [phone] and ask to speak with a nurse.  For appointment changes call: Main Clinic [phone].  Toll free number is [phone].    On Nights, Weekends and Holidays:  Call [phone] and ask for the adult hematologist/oncologist on call.      [facility]  [address]  [place], [place] [address]  [email or web address]    Results for orders placed or performed in visit on 12/08/19   Comprehensive Metabolic Panel   Result Value Ref Range    Sodium 136 135 - 145 mmol/L    Potassium 4.0 3.4 - 4.5 mmol/L    Chloride 104 98 - 107 mmol/L    Anion Gap 7 5 - 14 mmol/L    CO2 25.0 20.0 - 31.0 mmol/L    BUN 18 9 - 23 mg/dL    Creatinine 2.44 (H) 0.60 - 0.80 mg/dL    BUN/Creatinine Ratio 21     EGFR CKD-EPI Non-African American, Female 64 >=60 mL/min/1.80m2    EGFR CKD-EPI African American, Female 23 >=60 mL/min/1.23m2    Glucose 129 70 - 179 mg/dL    Calcium 9.1 8.7 - 01.0 mg/dL    Albumin 3.2 (L) 3.4 - 5.0 g/dL    Total Protein 5.9 5.7 - 8.2 g/dL    Total Bilirubin 0.6 0.3 - 1.2 mg/dL    AST 69 (H) <=27 U/L    ALT 62 (H) 10 - 49 U/L    Alkaline Phosphatase 699 (H) 46 - 116 U/L   CBC w/ Differential   Result Value Ref Range    WBC 2.0 (L) 4.5 - 11.0 10*9/L    RBC 3.99 (L) 4.00 - 5.20 10*12/L    HGB 11.4 (L) 12.0 - 16.0 g/dL    HCT 25.3 (L) 66.4 - 46.0 %    MCV 85.4 80.0 - 100.0 fL    MCH 28.7 26.0 - 34.0 pg    MCHC 33.6 31.0 - 37.0 g/dL    RDW 40.3 (H) 47.4 - 15.0 %    MPV 12.6 (H) 7.0 - 10.0 fL    Platelet 39 (L) 150 - 440 10*9/L    Variable HGB Concentration  Slight (A) Not Present    Neutrophils % 59.3 %    Lymphocytes % 24.7 %    Monocytes % 10.0 %    Eosinophils % 3.3 %    Basophils % 0.4 %    Absolute Neutrophils 1.2 (L) 2.0 - 7.5 10*9/L    Absolute Lymphocytes 0.5 (L) 1.5 - 5.0 10*9/L    Absolute Monocytes 0.2 0.2 - 0.8 10*9/L    Absolute Eosinophils 0.1 0.0 - 0.4 10*9/L    Absolute Basophils 0.0 0.0 - 0.1 10*9/L    Large Unstained Cells 2 0 - 4 %    Microcytosis Slight (A) Not Present    Anisocytosis Slight (A) Not Present    Hypochromasia Slight (A) Not Present

## 2019-12-09 DIAGNOSIS — T451X5A Adverse effect of antineoplastic and immunosuppressive drugs, initial encounter: Principal | ICD-10-CM

## 2019-12-09 DIAGNOSIS — D701 Agranulocytosis secondary to cancer chemotherapy: Principal | ICD-10-CM

## 2019-12-09 MED ORDER — LEVOFLOXACIN 500 MG TABLET
ORAL_TABLET | 0 refills | 0 days
Start: 2019-12-09 — End: ?

## 2019-12-11 NOTE — Unmapped (Addendum)
[facility] Visit    Patient Name: Katrina Gould  Patient Age: 79 y.o.  Encounter Date: 12/08/2019      Primary Care Provider:  Lauro Regulus, MD    Referring Physician:  Lauro Regulus, MD    REASON FOR VISIT:   79 y.o. female here to follow up for her CLL.    ASSESSMENT/PLAN     Katrina Gould is a 79 y.o. 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 Disease markers: 46,XX,der(4)t(4;4)(p14;q21). IGHV unmutated (0%, 3-33*01). B71m 5.05. No TP53 mutation. High-risk by CLL-IPI (6 points).    Platelet count downtrended despite the eltrombopag. BM bx 03/26/19 most consistent with ITP and CLL. After discussion with patient/son, stopped eltrombopag and started obinutuzumab 04/2019. Patient has now received 5 doses without improvement in platelets.     Given refractory nature of CLL-related ITP, initiated RCD. A total of 4 cycles were completed. She developed a transient improvement in platelets, however the courses, despite dose-reductions, were complicated by episodes of severe neutropenia and intolerance of steroid doses. Treatment was stopped in 10/2019 due to lack of durable response and intolerance of the therapy.    Her cell counts have been stable over the past month. Her primary issue is new weight gain/edema and she is meeting with her Cardiologist later this week for evaluation management.    We discussed the stability of her cell counts and our overall plan: if/when her platelets drop <30K, we may consider an oral regimen (acalabrutinib). This would be offered with the goal of a more tolerable therapy that may provide benefit. She continues to feel comfortable with this plan.    LFTs are slowly improving. Continue entecavir and monitor LFTs at each visit. Consider repeat hepatitis testing if LFTs rise.    Given the stability of cell counts, will extend the visits to 2 months.    We discussed COVID vaccination/precautions. She has recently completed rituximab. Recommended continued caution and will discuss when to vaccinate.    Plans and Recommendations:    1. CLL with CLL-related ITP  - observation  - RTC in 2 months  - consider acalabrutinib if/when platelets are consistently <30K    2. 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 Hx Neutropenia during nadirs of RCD therapy  - 8/09 - ANC 1.2    4. Hx Platelet transfusion reaction: Patient has had febrile non-hemolytic transfusion reaction x 2. Also had chest pain during reaction possibly related to underlying CAD.  -- infuse platelets more slowly per Transfusion Medicine  -- follows with Cardiology  -- hold ASA while platelets < 50K  -- low threshold to reduce dexamethasone dose  -- monitor sxs    5. 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 Given risk of Hep B reactivation with RTX, on entecavir.   -- continue entecavir 0.5 mg daily.   - monitor HBV DNA PCR regularly - neg 5/12 and 6/8  - follow LFTs closely    -- Continue x 1 year s/p Ritux (last dose 10/07/2019, so 09/2020)    6. Transaminitis: mild elevations. Liver ultrasound negative  --hold statin  - monitor    7. History ofChest pain: Has had past episodes of chest pain, particularly with platelet transfusions. Recent TTE with EF > 55%. Cardiac cath not performed 2/2  thrombocytopenia but stress test recommended when platelets improve.   -- Cardiology follow up.     8. DOE and occasional rapid heart rate  - stable and chronic    9. Arthritis  - continues to use topical CBD oil and this has helped  - May continue prn tylenol but should avoid NSAIDs  - knows to avoid oral CBD oil    10 Constipation  - senna daily and PRN miralax    11 Fluid overload  - 8/9 - new peripheral edema and abdominal fullness - has an appointment with her cardiologist this Thursday  ??  11. Health Maintenance:   - rec annual flu vaccine  - recommend COVID-19 vaccine, but will need platelets consistently 50K or higher. We will defer until platelets more stable.     I personally spent 40 minutes face-to-face and non-face-to-face in the care of this patient, which includes all pre, intra, and post visit time on the date of service.    Langley Gauss, AGPCNP-BC  Nurse Practitioner  Hematology/Oncology  [facility]    Primary Oncolgist: Salli Real, MD      INTERVAL HISTORY     Patient here for follow up.    She reports a less eventful month without the treatment. She still gets tired and SOB easily. Her biggest concern is that she has developed swelling in her ankles/feet and her belly feels swollen too. She reports this happened in the last week. She denies any SOB or orthopnea. She has an appointment with her cardiologist in 3 days to evaluate.  She has not had any bleeding  She still feels drunk frequently.  She continues to keep active, doing lots of chores around her house, she just takes breaks.  She denies any bleeding.    She reports that Dr. Lonni Fix told her very clearly not to get the COVID vaccine. She is staying careful and wonders about if there will be a point when she should get the vaccine.      Further ROS negative.       HEMATOLOGICAL / ONCOLOGICAL HISTORY:     Oncology History Overview Note   CLL    First seen in oncology clinic locally 03/31/16    Had a left axillary biopsy 03/15/16 that confirmed CLL/SLL: Flow showed monoclonal B-cell population with CD5, CD23, kappa light chain. IHC showed positive CD20 (diffuse), negative for cyclin D1.    No constitutional symptoms except some sweats    Labs: Hb 12.0, Hct 34.8, plt 147. WBC was 10.6 with ANC 2.5. ALC was 7.5K.    CLL FISH studies: normal panel. No cells with 11q, tri 12, 17p or 13q, and the 11;14 translocation also not detected.    Karyotype: 46,XX,der(4)t(4;4)(p14;q21)[3]/45,XX,dic(6;17)(q12;p11.2) (no loss of TP53 on FISH)    Molecular: No TP53 mutation    CLL-IPI: As per Lancet Oncol Vol 15 October 2014  Includes (1) TP53 status (no abnormalities vs. Del 17p and/or TP53 mutation - 4 points)=0, (2) IGHV mutation status (mut vs unmut - 2 points)=2 (3) serum B2-microglobulin (</=3.5 vs >3.5 - 2 points)=2 (4) clinical stage (Rai 0 vs Rai I-IV - 1 point)=1 and (5) age (</=65 vs >65 - 1 point)=1    Total: 6 points    Low risk (0-1 points)= 93.2% OS at 5 years  Int risk (2-3 points)= 79.3%  High risk (4-6 points)= 63.3%    Very high risk (7-10 points)= 23.3%     Received rituximab weekly x4 from 09/14/17 to 10/05/17  -  For ITP 2/t CLL vs. CLL  - No response    Started ven/obi regimen on 10/23/17  Baseline counts:  WBC 45.9  ALC 2.6  Hb 12.5  Plt 10    03/22/18: BM bx without overt CLL, MRD testing at [facility] revealed CD5-positive, kappa light chain-restricted B-cell  population is detected. MRD = 0.32%.    05/15/18: Repeated BM bx given persistent low counts - ~1% CLL in otherwise normal appearing marrow. Myeloid mutation panel negative. Karyotype: 45,XX,dic(6;17)(q12;p11.2)[2]/46,XX[18].       CLL (chronic lymphocytic leukemia) (CMS-HCC)   03/15/2016 Initial Diagnosis    CLL (chronic lymphocytic leukemia) (RAF-HCC)     09/14/2017 - 10/05/2017 Chemotherapy    Rituximab weekly x4  No response     09/14/2017 - 10/05/2017 Chemotherapy    Rituximab x 4     10/23/2017 -  Chemotherapy    Began ven/obi     03/19/2018 Adverse Reaction    Obinutuzumab held given persistent thrombocytopenia. Marrow performed on 03/22/18, patient with no overt CLL (0.32% by MRD flow testing from [facility]). Do not plan to give 6th cycle of obi (unless counts recover in a reasonable timeframe).     03/22/2018 Biopsy    Bone marrow, right iliac, aspiration and biopsy  -  Normocellular bone marrow (30%) with erythroid-predominant trilineage hematopoiesis   -  Suspicious for low level (<5%) residual involvement by chronic lymphocytic leukemia in a background of reactive lymphoid aggregates  -  Routine cytogenetic results reveal a normal karyotype; FISH (CLL panel) results are normal in a limited analysis; see details below (case [id number]).  -  Flow cytometric CLL MRD results reveal a CD5-positive, kappa light chain-restricted B-cell population is detected; MRD = 0.32%; see details in the Referral Testing Report in the Media Tab.     04/09/2018 Adverse Reaction    ANC down to 0.2. Held venetoclax (was previously at 200 mg daily).     05/15/2018 Biopsy    Bone marrow, right iliac, aspiration and biopsy  -  Mildly hypercellular bone marrow (40%) with trilineage hematopoiesis and persistent low level (less than 1% of cells) involvement by chronic lymphocytic leukemia by flow cytometric analysis  -  Numerous reactive lymphoid aggregates  -  No significant morphologic dyspoiesis or increase in blasts  -  Routine cytogenetic results reveal an abnormal karyotype; FISH (CLL panel) results are normal; see details below (case [id number]).  -  Myeloid mutation panel results reveal no mutations were identified in the 34 genes analyzed; see details below (case [id number]).     03/26/2019 Biopsy    Bone marrow, right iliac, aspiration and biopsy  -  Slightly hypercellular bone marrow (40%) with persistent low-level involvement by patient's known chronic lymphocytic leukemia, representing approximately 5% of marrow cells by PAX5 immunohistochemical stain.  -  Increased megakaryocytes identified on bone marrow aspirate smears (see Comment)  -  No significant morphologic dyspoiesis or increase in blasts.  -  Routine cytogenetic analysis is pending.  -  Myeloid mutation panel is pending.    Although not a specific finding, increased megakaryocytes can be seen in the setting of immune-mediated thrombocytopenia (ITP).     08/13/2019 -  Chemotherapy    Cycle 2 RCD      riTUXimab IV 375 mg/m2 on day 1,   cyclophosphamide 750 mg/m2 IV on day 1  Dexamethasone 12 mg Days 1-7 - dose reduced to 6mg  on day 4 due to headaches, drunken feeling     09/10/2019 -  Chemotherapy  Cycle 3 RCD riTUXimab IV 375 mg/m2 on day 1,   cyclophosphamide 500 mg/m2 IV on day 1 (33% dose-reduction)  Dexamethasone 6 mg Days 1-7     Dose reductions due to neutropenia (1 week ANC 0.1) and symptoms     B-cell chronic lymphocytic leukemia (CMS-HCC)   09/05/2017 - 10/11/2017 Chemotherapy    Chemotherapy Treatment    Treatment Goal Control   Line of Treatment [No plan line of treatment]   Plan Name OP RITUXIMAB    Start Date 09/14/2017   End Date 10/05/2017   Provider Pernell Dupre, MD   Chemotherapy riTUXimab (RITUXAN) 712.5 mg in sodium chloride (NS) 0.9 % 500 mL IVPB, 375 mg/m2 = 712.5 mg, Intravenous, Once, 1 of 1 cycle  Administration: 712.5 mg (09/14/2017)  riTUXimab (RITUXAN) 712.5 mg in sodium chloride (NS) 0.9 % 250 mL rapid infusion, 375 mg/m2 = 712.5 mg, Intravenous, Once, 1 of 1 cycle  Administration: 712.5 mg (09/21/2017), 712.5 mg (09/28/2017), 712.5 mg (10/05/2017)        10/16/2017 Initial Diagnosis    Chronic lymphocytic leukemia, Rai stage IV (CMS-HCC)     10/23/2017 - 03/18/2018 Chemotherapy    OP OBINUTUZUMAB AND VENETOCLAX  chlorambucil 0.5 mg/kg PO on days 1, 15, obinutuzumab 100 mg IV on day 1, obinutuzumab 900 mg IV on day 2, obinutuzumab 1,000 mg IV on days 8,15 on Cycle 1, then obinutuzumab 1,000 mg IV on day 1 for sequent cycles, every 28 days.     04/07/2019 - 06/30/2019 Chemotherapy    OP CLL OBINUTUZUMAB  chlorambucil 0.5 mg/kg PO on days 1, 15, obinutuzumab 100 mg IV on day 1, obinutuzumab 900 mg IV on day 2, obinutuzumab 1,000 mg IV on days 8,15 on Cycle 1, then obinutuzumab 1,000 mg IV on day 1 for sequent cycles, every 28 days.     07/16/2019 -  Chemotherapy    Cycle 1 RCD     riTUXimab IV 375 mg/m2 on day 1,   cyclophosphamide 750 mg/m2 IV on day 1  Dexamethasone 12 mg Days 1-7         08/13/2019 -  Chemotherapy    Cycle 2 RCD      riTUXimab IV 375 mg/m2 on day 1,   cyclophosphamide 750 mg/m2 IV on day 1  Dexamethasone 12 mg Days 1-7 - dose reduced to 6mg  on day 4 due to headaches, drunken feeling     09/10/2019 -  Chemotherapy    Cycle 3 RCD      riTUXimab IV 375 mg/m2 on day 1,   cyclophosphamide 500 mg/m2 IV on day 1 (33% dose-reduction)  Dexamethasone 6 mg Days 1-7     Dose reductions due to neutropenia (1 week ANC 0.1) and symptoms     10/07/2019 -  Chemotherapy    Cycle 4 RCD       -- rituximab 375 mg/m2 on D1  -- cyclophosphamide 500 mg/m2 on D1 (dose-reduced)  -- dexamethasone  6 mg on D1-7 (dose-reduced)                  OTHER PAST MEDICAL HISTORY:     Patient Active Problem List   Diagnosis   ??? CLL (chronic lymphocytic leukemia) (CMS-HCC)   ??? Chronic migraine without aura   ??? 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 Left bundle branch block (LBBB)   ??? Hypercholesterolemia   ??? Chest pain, unspecified   ??? GERD (gastroesophageal reflux disease)   ??? Osteoporosis   ???  Needs flu shot   ??? BRBPR (bright red blood per rectum)   ??? Other autoimmune hemolytic anemia   ??? B-cell chronic lymphocytic leukemia (CMS-HCC)   ??? Aortic atherosclerosis (CMS-HCC)   ??? Diverticulitis of colon   ??? Epigastric pain   ??? Gastritis, Helicobacter pylori   ??? H/O adenomatous polyp of colon   ??? Healthcare maintenance   ??? Lymphadenopathy   ??? Migraine without aura and without status migrainosus, not intractable   ??? Syncope and collapse   ??? White matter disease   ??? Vasovagal syncope   ??? Chemotherapy-induced neutropenia (CMS-HCC)   ??? Coccyx pain   ??? Chemotherapy induced diarrhea   ??? Chronic right shoulder pain   ??? Chemotherapy-induced thrombocytopenia   ??? Thrombocytopenia (CMS-HCC)   ??? Dyspnea on exertion   ??? Idiopathic thrombocytopenic purpura (ITP) (CMS-HCC)   ??? Oral thrush   ??? Febrile nonhemolytic transfusion reaction   ??? Transfusion reaction   ??? Exertional chest pain   ??? Transaminitis       ALLERGIES:     Baclofen    MEDICATIONS:       Outpatient Encounter Medications as of 12/08/2019   Medication Sig Dispense Refill   ??? allopurinoL (ZYLOPRIM) 100 MG tablet Take 300 mg by mouth.     ??? amLODIPine (NORVASC) 5 MG tablet Take 1 tablet (5 mg total) by mouth daily. 30 tablet 2   ??? entecavir (BARACLUDE) 0.5 MG tablet Take 1 tablet (0.5 mg total) by mouth daily. 30 tablet 11   ??? fluticasone propion-salmeterol (ADVAIR) 100-50 mcg/dose diskus Inhale 2 puffs daily.      ??? furosemide (LASIX) 20 MG tablet Take 20 mg by mouth.     ??? loperamide (IMODIUM A-D) 2 mg tablet Take 2 mg by mouth Three (3) times a day as needed for diarrhea.      ??? LORazepam (ATIVAN) 0.5 MG tablet Take 1 tablet (0.5 mg total) by mouth nightly as needed for anxiety. 30 tablet 1   ??? metoprolol succinate (TOPROL-XL) 25 MG 24 hr tablet Take 1 tablet (25 mg total) by mouth daily. 30 tablet 3   ??? mirtazapine (REMERON) 7.5 MG tablet Take 7.5 mg by mouth nightly.     ??? multivitamin (TAB-A-VITE/THERAGRAN) per tablet Take 1 tablet by mouth daily.      ??? mv-mn-vitC-[id number] (AIRBORNE, ASCORBATE SODIUM,) 333-1.7 mg Chew Chew 2 tablets Three (3) times a day as needed (cold prevention).     ??? pantoprazole (PROTONIX) 40 MG tablet Take by mouth daily.      ??? prochlorperazine (COMPAZINE) 10 MG tablet Take 1 tablet (10 mg total) by mouth every six (6) hours as needed (nausea). 30 tablet 2   ??? sertraline (ZOLOFT) 50 MG tablet Take 50 mg by mouth daily.  1   ??? tiZANidine (ZANAFLEX) 2 MG tablet Take 1 tablet (2 mg total) by mouth nightly as needed. 30 tablet 0     No facility-administered encounter medications on file as of 12/08/2019.       Physical Exam:  Vitals:    12/08/19 1321   BP: 130/68   Pulse: 77   Resp: 16   Temp: 36.5 ??C (97.7 ??F)   SpO2: 95%       General: Well-appearing older female in no acute distress  HEENT:  Clear sclera, conjunctiva, mask in place  LYMPH:  no palpable cervical, supraclavicular, axillary, or inguinal nodes  CARDAC: RRR, no M/R/Gs, trace peripheral edema  RESP: nonlabored, bilaterally CTA  GI: Soft,  nontender, active bowel sounds, no hepatosplenomegaly  PSYCH: appropriate  DERM: no visible rashes, lesions.   LINE: PORT    Labs:  Results for orders placed or performed in visit on 12/08/19   Comprehensive Metabolic Panel   Result Value Ref Range    Sodium 136 135 - 145 mmol/L    Potassium 4.0 3.4 - 4.5 mmol/L    Chloride 104 98 - 107 mmol/L    Anion Gap 7 5 - 14 mmol/L    CO2 25.0 20.0 - 31.0 mmol/L    BUN 18 9 - 23 mg/dL    Creatinine 1.61 (H) 0.60 - 0.80 mg/dL    BUN/Creatinine Ratio 21     EGFR CKD-EPI Non-African American, Female 64 >=60 mL/min/1.21m2    EGFR CKD-EPI African American, Female 11 >=60 mL/min/1.28m2    Glucose 129 70 - 179 mg/dL    Calcium 9.1 8.7 - 09.6 mg/dL    Albumin 3.2 (L) 3.4 - 5.0 g/dL    Total Protein 5.9 5.7 - 8.2 g/dL    Total Bilirubin 0.6 0.3 - 1.2 mg/dL    AST 69 (H) <=04 U/L    ALT 62 (H) 10 - 49 U/L    Alkaline Phosphatase 699 (H) 46 - 116 U/L   CBC w/ Differential   Result Value Ref Range    WBC 2.0 (L) 4.5 - 11.0 10*9/L    RBC 3.99 (L) 4.00 - 5.20 10*12/L    HGB 11.4 (L) 12.0 - 16.0 g/dL    HCT 54.0 (L) 98.1 - 46.0 %    MCV 85.4 80.0 - 100.0 fL    MCH 28.7 26.0 - 34.0 pg    MCHC 33.6 31.0 - 37.0 g/dL    RDW 19.1 (H) 47.8 - 15.0 %    MPV 12.6 (H) 7.0 - 10.0 fL    Platelet 39 (L) 150 - 440 10*9/L    Variable HGB Concentration Slight (A) Not Present    Neutrophils % 59.3 %    Lymphocytes % 24.7 %    Monocytes % 10.0 %    Eosinophils % 3.3 %    Basophils % 0.4 %    Absolute Neutrophils 1.2 (L) 2.0 - 7.5 10*9/L    Absolute Lymphocytes 0.5 (L) 1.5 - 5.0 10*9/L    Absolute Monocytes 0.2 0.2 - 0.8 10*9/L    Absolute Eosinophils 0.1 0.0 - 0.4 10*9/L    Absolute Basophils 0.0 0.0 - 0.1 10*9/L    Large Unstained Cells 2 0 - 4 %    Microcytosis Slight (A) Not Present    Anisocytosis Slight (A) Not Present    Hypochromasia Slight (A) Not Present

## 2019-12-17 MED FILL — ENTECAVIR 0.5 MG TABLET: ORAL | 30 days supply | Qty: 30 | Fill #5

## 2019-12-17 MED FILL — ENTECAVIR 0.5 MG TABLET: 30 days supply | Qty: 30 | Fill #5 | Status: AC

## 2020-01-12 NOTE — Unmapped (Signed)
[facility] Clinical Assessment & Refill Coordination Note    Katrina Gould, DOB: 03/26/1941  Phone: [phone] (home) [phone] (work)    All above HIPAA information was verified with patient.     Was a Nurse, learning disability used for this call? No    Specialty Medication(s):   Infectious Disease: entecavir     Current Outpatient Medications   Medication Sig Dispense Refill   ??? allopurinoL (ZYLOPRIM) 100 MG tablet Take 300 mg by mouth.     ??? amLODIPine (NORVASC) 5 MG tablet Take 1 tablet (5 mg total) by mouth daily. 30 tablet 2   ??? entecavir (BARACLUDE) 0.5 MG tablet Take 1 tablet (0.5 mg total) by mouth daily. 30 tablet 11   ??? fluticasone propion-salmeterol (ADVAIR) 100-50 mcg/dose diskus Inhale 2 puffs daily.      ??? furosemide (LASIX) 20 MG tablet Take 20 mg by mouth.     ??? loperamide (IMODIUM A-D) 2 mg tablet Take 2 mg by mouth Three (3) times a day as needed for diarrhea.      ??? LORazepam (ATIVAN) 0.5 MG tablet Take 1 tablet (0.5 mg total) by mouth nightly as needed for anxiety. 30 tablet 1   ??? metoprolol succinate (TOPROL-XL) 25 MG 24 hr tablet Take 1 tablet (25 mg total) by mouth daily. 30 tablet 3   ??? mirtazapine (REMERON) 7.5 MG tablet Take 7.5 mg by mouth nightly.     ??? multivitamin (TAB-A-VITE/THERAGRAN) per tablet Take 1 tablet by mouth daily.      ??? mv-mn-vitC-asbNa-Glu-Lys-hc124 (AIRBORNE, ASCORBATE SODIUM,) 333-1.7 mg Chew Chew 2 tablets Three (3) times a day as needed (cold prevention).     ??? pantoprazole (PROTONIX) 40 MG tablet Take by mouth daily.      ??? prochlorperazine (COMPAZINE) 10 MG tablet Take 1 tablet (10 mg total) by mouth every six (6) hours as needed (nausea). 30 tablet 2   ??? sertraline (ZOLOFT) 50 MG tablet Take 50 mg by mouth daily.  1   ??? tiZANidine (ZANAFLEX) 2 MG tablet Take 1 tablet (2 mg total) by mouth nightly as needed. 30 tablet 0     No current facility-administered medications for this visit.        Changes to medications: [name] reports no changes at this time.    Allergies   Allergen Reactions   ??? Baclofen      made me go crazy       Changes to allergies: No    SPECIALTY MEDICATION ADHERENCE     entecavir 0.5 mg: 8 days of medicine on hand     Medication Adherence    Patient reported X missed doses in the last month: 1  Specialty Medication: Entecavir 0.5 mg daily  Patient is on additional specialty medications: No  Informant: patient  Confirmed plan for next specialty medication refill: delivery by pharmacy          Specialty medication(s) dose(s) confirmed: Regimen is correct and unchanged.     Are there any concerns with adherence? No    Adherence counseling provided? Not needed    CLINICAL MANAGEMENT AND INTERVENTION      Clinical Benefit Assessment:    Do you feel the medicine is effective or helping your condition? Yes    Clinical Benefit counseling provided? Not needed    Adverse Effects Assessment:    Are you experiencing any side effects? Yes, patient reports experiencing headaches. Side effect counseling provided: can take Tylenol ES 1-2 tablets 2-3 times  daily as needed.  She was also prescribe medication by provider for headaches but doesn't like to take it unless needed.    Are you experiencing difficulty administering your medicine? 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 For example, brushing your teeth or getting up in the morning. 0    Have you discussed this with your provider? Not needed    Therapy Appropriateness:    Is therapy appropriate? Yes, therapy is appropriate and should be continued    DISEASE/MEDICATION-SPECIFIC INFORMATION      N/A    PATIENT SPECIFIC NEEDS     - Does the patient have any physical, cognitive, or cultural barriers? No    - Is the patient high risk? No    - Does the patient require a Care Management Plan? No     - Does the patient require physician intervention or other additional services (i.e. nutrition, smoking cessation, social work)? No      SHIPPING     Specialty Medication(s) to be Shipped:   Infectious Disease: entecavir    Other medication(s) to be shipped: No additional medications requested for fill at this time     Changes to insurance: No    Delivery Scheduled: Yes, Expected medication delivery date: 01/15/20.     Medication will be delivered via Next Day Courier to the confirmed prescription address in [facility].    The patient will receive a drug information handout for each medication shipped and additional FDA Medication Guides as required.  Verified that patient has previously received a Conservation officer, historic buildings.    All of the patient's questions and concerns have been addressed.    Breck Coons [facility] Specialty Pharmacist

## 2020-01-14 MED FILL — ENTECAVIR 0.5 MG TABLET: 30 days supply | Qty: 30 | Fill #6 | Status: AC

## 2020-01-14 MED FILL — ENTECAVIR 0.5 MG TABLET: ORAL | 30 days supply | Qty: 30 | Fill #6

## 2020-01-27 DIAGNOSIS — U071 COVID-19: Principal | ICD-10-CM

## 2020-01-27 NOTE — Unmapped (Signed)
COVID 19, see MAB telephone call.

## 2020-01-27 NOTE — Unmapped (Signed)
Triage RN returned call to pt and advised that she could call the clinical contact center ??immediately to discuss the mAb infusion, criteria and scheduling.  This RN stressed that she should call [phone] immediately after hanging up the call with me.  Pt states understanding and read the phone number back with confirmation.

## 2020-01-27 NOTE — Unmapped (Signed)
AOC Triage Note     Patient: Katrina Gould     Reason for call:  return call    Time call returned: 1355     Phone Assessment: pt calling because she tested positive for covid at the [place] clinic at [facility] on Friday.  She denies fever, nv, diarrhea or SOB, but has a terrible cough.  She is questioning if she should get the monoclonial antibodies.  She states that they recommended it there, but are out and are waiting on another shipment.    Triage Recommendations: RN will consult with team and someone will call her back with further information

## 2020-01-27 NOTE — Unmapped (Signed)
Katrina Gould contacted the [employer] requesting to speak with the care team of [name] to discuss:    Patient has Covid and has some questions for Dr. Lonni Fix.    Please contact [name] at [phone].        Check Indicates criteria has been reviewed and confirmed with the patient:    [x]  Preferred Name   [x]  DOB and/or MR#  [x]  Preferred Contact Method  [x]  Phone Number(s)   []  MyChart     Thank you,   [name]  [facility]   [phone]

## 2020-01-27 NOTE — Unmapped (Signed)
Positive COVID 9/22    01/27/20    Patient is calling because they: are interested in infusion therapy.     Patient is experiencing symptoms severe enough that they would like to speak with a nurse? No    Patient is interested in infusion therapy. Advised I can send your request to our therapy coordinators who will call you back to determine your eligibility and discuss any questions you may have. Our therapy coordinators are working hard to process requests and contact as many patients as they can each day. You may receive a call back shortly or in a day or two. To avoid delays: Please answer calls from unfamiliar numbers, ensure your phone is set up to accept/receive voicemails and call back if you haven't received a call in 48 hours. Submitted order for Request for COVID Therapeutics.    The call was resolved in the following manner: Encounter routed to [facility] pool      COVID Infusion Therapy Questionnaire    Have you received a positive COVID test result (excluding antibody testing) AND are currently in an outpatient setting? Yes  Do you have at least one mild or moderate COVID symptom that began no more than 10 days ago? Yes  What date did you test COVID+ (mm/dd/yyyy): 01/21/2020  What date did your symptoms start (mm/dd/yyyy): 01/21/2020  Which of the following COVID symptoms are you experiencing: Cough and Muscle pain  Do you (1) have new oxygen requirements or (2) increased oxygen requirement due to COVID -19? No  Patient is under 3 years of age? No      Do you have any of the following conditions?  ??? Over the age of 73: Yes  ??? Pregnant or recent delivery within 6 weeks: No  ??? BMI 25 or above: Yes  o Patient BMI: 26.9  ??? Immunosuppressive disease (e.g., cancer, not in remission; solid organ transplant; HIV; CD4 <200 cells/m3): Yes  ??? Immunosuppressive treatment (e.g., chemotherapy; rituximab; steroid use at least 20 mg/day or at least 2 mg/kg/day prednisone or equivalent for at least 14 days): Yes  ??? Diabetes: No  ??? Chronic kidney disease: No  ??? Cardiovascular disease: No  ??? Hypertension: No  ??? COPD, asthma, reactive airway disease or other chronic respiratory disease requiring daily inhaled steroids plus a second controller medicine or history of ICU admission for asthma: No      Resolution  Patient qualifies - Due to your recent positive COVID-19 result, [facility] has the capability to offer monoclonal antibody therapy.  The U.S. FDA has issued an Emergency Use Authorization to permit the emergency use of the unapproved monoclonal antibodies for the treatment of mild to moderate coronavirus disease 2019 (COVID-19).  This treatment may help to prevent progression on to severe symptoms and/or hospitalization in those who are confirmed to be high-risk COVID (+) and are within 10 days of symptom onset.  Answered all questions utilizing FAQ sheet.  Advised patient that those receiving monoclonal or plasma products should wait 90 days until getting COVID-19 immunized. At this time there are no approved and available alternative treatments for out-patients.    Patient Agreed to therapy. Scheduling notified.  Patient County: [facility]

## 2020-01-28 DIAGNOSIS — U071 COVID-19: Principal | ICD-10-CM

## 2020-01-28 NOTE — Unmapped (Signed)
01/27/20    Patient is calling because they: missed the call from the MAB scheduler or MAB coordinator.    Patient is experiencing symptoms severe enough that they would like to speak with a nurse? No    Patient missed call to schedule. Advised I'm sorry you missed the call to schedule your COVID therapy appointment. We will let the therapy coordinators know that you've returned the call so that they can reach out to you again. To avoid further delays: Please answer calls from unfamiliar numbers and ensure your phone is set up to accept/receive voicemails. Routed Telephone Encounter to [facility] pool.    The call was resolved in the following manner: Encounter routed to P [facility] pool

## 2020-01-28 NOTE — Unmapped (Signed)
Completed.

## 2020-01-28 NOTE — Unmapped (Signed)
01/27/20      Patient qualifies - Due to your recent positive COVID-19 result, [facility] has the capability to offer monoclonal antibody therapy.  The U.S. FDA has issued an Emergency Use Authorization to permit the emergency use of the unapproved monoclonal antibodies for the treatment of mild to moderate coronavirus disease 2019 (COVID-19).  This treatment may help to prevent progression on to severe symptoms and/or hospitalization in those who are confirmed to be high-risk COVID (+) and are within 10 days of symptom onset.  Answered all questions utilizing FAQ sheet.  Advised patient that those receiving monoclonal or plasma products should wait 90 days until getting COVID-19 immunized. At this time there are no approved and available alternative treatments for out-patients.   Patient Declined therapy. Pt states the drive to our closest location is too far from where she lives. Pt states she will call [facility] to inquire on a closer location to her home and will give [facility] a return call should she not be able to find a closer location.   Symptom onset date (mm/dd/yyyy): 01/21/20  Patient County: [facility]

## 2020-01-28 NOTE — Unmapped (Signed)
Received message that pt returned call to [facility] regarding MAB therapy. Nurse returned pt call to follow up. Pt states not reaching anyone at [facility] # and asked what our closest location to her home address would be. Looked up pt home address to closest MAB infusion site [facility]) and advised pt of it being 34 miles. Pt prefers to contact [facility] in the morning and will reach back out to [facility] should she not be able to find a MAB infusion site closer to home. Pt states if she can not find a closer location then she will call [facility] in the morning and ask to be scheduled at [facility] [place] MAB infusion site. Pt states having no further questions at this time.        Miguel Aschoff, BSN, Theatre manager - Population Health  Phone: [phone]

## 2020-01-28 NOTE — Unmapped (Signed)
She will get the Regeneron therapy in her [place] office tomorrow am. Please cancel the [facility] therapy request from [facility].     Reason for Disposition  ??? Information only question and nurse able to answer    Answer Assessment - Initial Assessment Questions  1. REASON FOR CALL or QUESTION: What is your reason for calling today? or How can I best help you? or What question do you have that I can help answer?      No longs needs MAB therapy at [facility] will get treatment of Regeneron in the morning at her local clinic.    Protocols used: INFORMATION ONLY CALL - NO TRIAGE-ADULT-[place]

## 2020-01-28 NOTE — Unmapped (Signed)
01/28/20    Patient is calling because they: are interested in infusion therapy.     Patient is experiencing symptoms severe enough that they would like to speak with a nurse? No    Patient is interested in infusion therapy. Advised I can send your request to our therapy coordinators who will call you back to determine your eligibility and discuss any questions you may have. Our therapy coordinators are working hard to process requests and contact as many patients as they can each day. You may receive a call back shortly or in a day or two. To avoid delays: Please answer calls from unfamiliar numbers, ensure your phone is set up to accept/receive voicemails and call back if you haven't received a call in 48 hours. Submitted order for Request for COVID Therapeutics.    The call was resolved in the following manner: Order submitted for Request for COVID Therapeutics    Not vaccinated due to CLL    COVID Infusion Therapy Questionnaire    Have you received a positive COVID test result (excluding antibody testing) AND are currently in an outpatient setting? Yes  Do you have at least one mild or moderate COVID symptom that began no more than 10 days ago? Yes  What date did you test COVID+ (mm/dd/yyyy): 01/21/20  What date did your symptoms start (mm/dd/yyyy): 01/19/20 Day 9   Which of the following COVID symptoms are you experiencing: Cough, Fever, Loss of taste or smell, Muscle pain and Shortness of breath  Do you (1) have new oxygen requirements or (2) increased oxygen requirement due to COVID -19? No  Patient is under 31 years of age? No      Do you have any of the following conditions?  ??? Over the age of 29: Yes  ??? Pregnant or recent delivery within 6 weeks: NA  ??? BMI 25 or above: Yes  o Patient BMI: 27.3  ??? Immunosuppressive disease (e.g., cancer, not in remission; solid organ transplant; HIV; CD4 <200 cells/m3): Cancer - Chronic lymphocytic Leukemia  ??? Immunosuppressive treatment (e.g., chemotherapy; rituximab; steroid use at least 20 mg/day or at least 2 mg/kg/day prednisone or equivalent for at least 14 days): No  ??? Diabetes: No  ??? Chronic kidney disease: No  ??? Cardiovascular disease: No  ??? Hypertension: No  ??? COPD, asthma, reactive airway disease or other chronic respiratory disease requiring daily inhaled steroids plus a second controller medicine or history of ICU admission for asthma: No      Resolution  Patient qualifies - Due to your recent positive COVID-19 result, [facility] has the capability to offer monoclonal antibody therapy.  The U.S. FDA has issued an Emergency Use Authorization to permit the emergency use of the unapproved monoclonal antibodies for the treatment of mild to moderate coronavirus disease 2019 (COVID-19).  This treatment may help to prevent progression on to severe symptoms and/or hospitalization in those who are confirmed to be high-risk COVID (+) and are within 10 days of symptom onset.  Answered all questions utilizing FAQ sheet.  Advised patient that those receiving monoclonal or plasma products should wait 90 days until getting COVID-19 immunized. At this time there are no approved and available alternative treatments for out-patients.    Patient Agreed to therapy. Scheduling notified.  Patient County: [facility]

## 2020-01-28 NOTE — Unmapped (Signed)
I called to discuss recent positive COVID test to see if pt able to make arrangements to get Regeneron.    She learned she is able to go [place] clinic - though is waiting to hear back on the appointment.    Only symptom is cough and back pain.    I told her better to get Regeneron sooner than later and said if not heard about appt from [facility] to call us to get in here, ideally today or tomorrow.    Told her we need to get next appt rescheduled - it's set up for Dr Malen Gauze on 10/4 which is an error as she is not a [facility] pt - will make appt requests for new appt to be with Gregary Signs or me in 3-4 weeks after COVID quarantine clears

## 2020-01-29 ENCOUNTER — Emergency Department: Payer: Medicare HMO

## 2020-01-29 ENCOUNTER — Inpatient Hospital Stay
Admission: EM | Admit: 2020-01-29 | Discharge: 2020-03-01 | DRG: 177 | Disposition: E | Payer: Medicare HMO | Source: Ambulatory Visit | Attending: Internal Medicine | Admitting: Internal Medicine

## 2020-01-29 ENCOUNTER — Other Ambulatory Visit: Payer: Self-pay

## 2020-01-29 DIAGNOSIS — F419 Anxiety disorder, unspecified: Secondary | ICD-10-CM | POA: Diagnosis present

## 2020-01-29 DIAGNOSIS — R110 Nausea: Secondary | ICD-10-CM | POA: Diagnosis present

## 2020-01-29 DIAGNOSIS — I82551 Chronic embolism and thrombosis of right peroneal vein: Secondary | ICD-10-CM | POA: Diagnosis present

## 2020-01-29 DIAGNOSIS — E785 Hyperlipidemia, unspecified: Secondary | ICD-10-CM | POA: Diagnosis present

## 2020-01-29 DIAGNOSIS — Z7982 Long term (current) use of aspirin: Secondary | ICD-10-CM

## 2020-01-29 DIAGNOSIS — A0839 Other viral enteritis: Secondary | ICD-10-CM | POA: Diagnosis present

## 2020-01-29 DIAGNOSIS — J1282 Pneumonia due to coronavirus disease 2019: Secondary | ICD-10-CM | POA: Diagnosis present

## 2020-01-29 DIAGNOSIS — M810 Age-related osteoporosis without current pathological fracture: Secondary | ICD-10-CM | POA: Diagnosis present

## 2020-01-29 DIAGNOSIS — N179 Acute kidney failure, unspecified: Secondary | ICD-10-CM | POA: Diagnosis present

## 2020-01-29 DIAGNOSIS — Z515 Encounter for palliative care: Secondary | ICD-10-CM | POA: Diagnosis not present

## 2020-01-29 DIAGNOSIS — C9110 Chronic lymphocytic leukemia of B-cell type not having achieved remission: Secondary | ICD-10-CM | POA: Diagnosis present

## 2020-01-29 DIAGNOSIS — Z66 Do not resuscitate: Secondary | ICD-10-CM | POA: Diagnosis not present

## 2020-01-29 DIAGNOSIS — E869 Volume depletion, unspecified: Secondary | ICD-10-CM | POA: Diagnosis present

## 2020-01-29 DIAGNOSIS — I447 Left bundle-branch block, unspecified: Secondary | ICD-10-CM | POA: Diagnosis present

## 2020-01-29 DIAGNOSIS — K219 Gastro-esophageal reflux disease without esophagitis: Secondary | ICD-10-CM | POA: Diagnosis present

## 2020-01-29 DIAGNOSIS — J9600 Acute respiratory failure, unspecified whether with hypoxia or hypercapnia: Secondary | ICD-10-CM | POA: Diagnosis present

## 2020-01-29 DIAGNOSIS — R000 Tachycardia, unspecified: Secondary | ICD-10-CM | POA: Diagnosis not present

## 2020-01-29 DIAGNOSIS — D6959 Other secondary thrombocytopenia: Secondary | ICD-10-CM | POA: Diagnosis present

## 2020-01-29 DIAGNOSIS — R55 Syncope and collapse: Secondary | ICD-10-CM | POA: Diagnosis present

## 2020-01-29 DIAGNOSIS — Z23 Encounter for immunization: Secondary | ICD-10-CM | POA: Diagnosis present

## 2020-01-29 DIAGNOSIS — I361 Nonrheumatic tricuspid (valve) insufficiency: Secondary | ICD-10-CM | POA: Diagnosis not present

## 2020-01-29 DIAGNOSIS — Z888 Allergy status to other drugs, medicaments and biological substances status: Secondary | ICD-10-CM

## 2020-01-29 DIAGNOSIS — F329 Major depressive disorder, single episode, unspecified: Secondary | ICD-10-CM | POA: Diagnosis present

## 2020-01-29 DIAGNOSIS — D509 Iron deficiency anemia, unspecified: Secondary | ICD-10-CM | POA: Diagnosis present

## 2020-01-29 DIAGNOSIS — U071 COVID-19: Secondary | ICD-10-CM | POA: Diagnosis present

## 2020-01-29 DIAGNOSIS — Z803 Family history of malignant neoplasm of breast: Secondary | ICD-10-CM

## 2020-01-29 DIAGNOSIS — G43709 Chronic migraine without aura, not intractable, without status migrainosus: Secondary | ICD-10-CM | POA: Diagnosis present

## 2020-01-29 DIAGNOSIS — J982 Interstitial emphysema: Secondary | ICD-10-CM | POA: Diagnosis not present

## 2020-01-29 DIAGNOSIS — R0602 Shortness of breath: Secondary | ICD-10-CM

## 2020-01-29 DIAGNOSIS — J45909 Unspecified asthma, uncomplicated: Secondary | ICD-10-CM | POA: Diagnosis present

## 2020-01-29 DIAGNOSIS — L409 Psoriasis, unspecified: Secondary | ICD-10-CM | POA: Diagnosis present

## 2020-01-29 DIAGNOSIS — D696 Thrombocytopenia, unspecified: Secondary | ICD-10-CM | POA: Diagnosis present

## 2020-01-29 DIAGNOSIS — J9601 Acute respiratory failure with hypoxia: Secondary | ICD-10-CM | POA: Diagnosis present

## 2020-01-29 DIAGNOSIS — Z7951 Long term (current) use of inhaled steroids: Secondary | ICD-10-CM

## 2020-01-29 DIAGNOSIS — I350 Nonrheumatic aortic (valve) stenosis: Secondary | ICD-10-CM | POA: Diagnosis not present

## 2020-01-29 DIAGNOSIS — I340 Nonrheumatic mitral (valve) insufficiency: Secondary | ICD-10-CM | POA: Diagnosis not present

## 2020-01-29 DIAGNOSIS — Z9221 Personal history of antineoplastic chemotherapy: Secondary | ICD-10-CM

## 2020-01-29 DIAGNOSIS — R7989 Other specified abnormal findings of blood chemistry: Secondary | ICD-10-CM

## 2020-01-29 DIAGNOSIS — Z8249 Family history of ischemic heart disease and other diseases of the circulatory system: Secondary | ICD-10-CM

## 2020-01-29 DIAGNOSIS — R0902 Hypoxemia: Secondary | ICD-10-CM

## 2020-01-29 DIAGNOSIS — Z79899 Other long term (current) drug therapy: Secondary | ICD-10-CM

## 2020-01-29 LAB — CBC WITH DIFFERENTIAL/PLATELET
Abs Immature Granulocytes: 0.02 10*3/uL (ref 0.00–0.07)
Basophils Absolute: 0 10*3/uL (ref 0.0–0.1)
Basophils Relative: 0 %
Eosinophils Absolute: 0 10*3/uL (ref 0.0–0.5)
Eosinophils Relative: 0 %
HCT: 35.6 % — ABNORMAL LOW (ref 36.0–46.0)
Hemoglobin: 12.2 g/dL (ref 12.0–15.0)
Immature Granulocytes: 1 %
Lymphocytes Relative: 18 %
Lymphs Abs: 0.6 10*3/uL — ABNORMAL LOW (ref 0.7–4.0)
MCH: 27.2 pg (ref 26.0–34.0)
MCHC: 34.3 g/dL (ref 30.0–36.0)
MCV: 79.5 fL — ABNORMAL LOW (ref 80.0–100.0)
Monocytes Absolute: 0.2 10*3/uL (ref 0.1–1.0)
Monocytes Relative: 6 %
Neutro Abs: 2.5 10*3/uL (ref 1.7–7.7)
Neutrophils Relative %: 75 %
Platelets: 73 10*3/uL — ABNORMAL LOW (ref 150–400)
RBC: 4.48 MIL/uL (ref 3.87–5.11)
RDW: 17.6 % — ABNORMAL HIGH (ref 11.5–15.5)
Smear Review: NORMAL
WBC: 3.3 10*3/uL — ABNORMAL LOW (ref 4.0–10.5)
nRBC: 0 % (ref 0.0–0.2)

## 2020-01-29 LAB — LACTIC ACID, PLASMA
Lactic Acid, Venous: 1.5 mmol/L (ref 0.5–1.9)
Lactic Acid, Venous: 1.3 mmol/L (ref 0.5–1.9)

## 2020-01-29 LAB — BASIC METABOLIC PANEL
Anion gap: 10 (ref 5–15)
BUN: 18 mg/dL (ref 8–23)
CO2: 22 mmol/L (ref 22–32)
Calcium: 8.6 mg/dL — ABNORMAL LOW (ref 8.9–10.3)
Chloride: 100 mmol/L (ref 98–111)
Creatinine, Ser: 0.97 mg/dL (ref 0.44–1.00)
GFR calc Af Amer: >60 mL/min (ref >60)
GFR calc non Af Amer: 55 mL/min — ABNORMAL LOW (ref >60)
Glucose, Bld: 130 mg/dL — ABNORMAL HIGH (ref 70–99)
Potassium: 3.8 mmol/L (ref 3.5–5.1)
Sodium: 132 mmol/L — ABNORMAL LOW (ref 135–145)

## 2020-01-29 LAB — BLOOD GAS, VENOUS
Acid-base deficit: 2.3 mmol/L — ABNORMAL HIGH (ref 0.0–2.0)
Bicarbonate: 22.5 mmol/L (ref 20.0–28.0)
O2 Saturation: 62.1 %
Patient temperature: 37
pCO2, Ven: 38 mmHg — ABNORMAL LOW (ref 44.0–60.0)
pH, Ven: 7.38 (ref 7.250–7.430)
pO2, Ven: 33 mmHg (ref 32.0–45.0)

## 2020-01-29 LAB — PROTIME-INR
INR: 1 (ref 0.8–1.2)
Prothrombin Time: 12.6 seconds (ref 11.4–15.2)

## 2020-01-29 LAB — TROPONIN I (HIGH SENSITIVITY): Troponin I (High Sensitivity): 28 ng/L — ABNORMAL HIGH (ref <18)

## 2020-01-29 LAB — GLUCOSE, CAPILLARY: Glucose-Capillary: 135 mg/dL — ABNORMAL HIGH (ref 70–99)

## 2020-01-29 LAB — APTT: aPTT: 35 seconds (ref 24–36)

## 2020-01-29 LAB — PROCALCITONIN: Procalcitonin: 0.22 ng/mL

## 2020-01-29 MED ORDER — GUAIFENESIN-DM 100-10 MG/5ML PO SYRP
10.0000 mL | ORAL_SOLUTION | ORAL | Status: DC | PRN
  Administered 2020-01-31 – 2020-02-04 (×6): 10 mL via ORAL
  Filled 2020-01-29 (×7): qty 10

## 2020-01-29 MED ORDER — PRAVASTATIN SODIUM 20 MG PO TABS
40.0000 mg | ORAL_TABLET | Freq: Every day | ORAL | Status: DC
  Administered 2020-01-29 – 2020-02-03 (×6): 40 mg via ORAL
  Filled 2020-01-29 (×7): qty 2

## 2020-01-29 MED ORDER — FESOTERODINE FUMARATE ER 4 MG PO TB24
4.0000 mg | ORAL_TABLET | Freq: Every day | ORAL | Status: DC
  Administered 2020-01-29 – 2020-02-04 (×7): 4 mg via ORAL
  Filled 2020-01-29 (×8): qty 1

## 2020-01-29 MED ORDER — SERTRALINE HCL 50 MG PO TABS
50.0000 mg | ORAL_TABLET | Freq: Every day | ORAL | Status: DC
  Administered 2020-01-29 – 2020-02-04 (×7): 50 mg via ORAL
  Filled 2020-01-29 (×7): qty 1

## 2020-01-29 MED ORDER — DEXAMETHASONE SODIUM PHOSPHATE 10 MG/ML IJ SOLN
6.0000 mg | INTRAMUSCULAR | Status: DC
  Administered 2020-01-29 – 2020-01-30 (×2): 6 mg via INTRAVENOUS
  Filled 2020-01-29 (×2): qty 1

## 2020-01-29 MED ORDER — SODIUM CHLORIDE 0.9% FLUSH: 3.0000 mL | INTRAVENOUS | Status: DC | PRN

## 2020-01-29 MED ORDER — ZINC SULFATE 220 (50 ZN) MG PO CAPS
220.0000 mg | ORAL_CAPSULE | Freq: Every day | ORAL | Status: DC
  Administered 2020-01-29 – 2020-02-04 (×7): 220 mg via ORAL
  Filled 2020-01-29 (×7): qty 1

## 2020-01-29 MED ORDER — BACLOFEN 10 MG PO TABS
10.0000 mg | ORAL_TABLET | Freq: Three times a day (TID) | ORAL | Status: DC
  Administered 2020-01-29: 10 mg via ORAL
  Filled 2020-01-29 (×2): qty 1

## 2020-01-29 MED ORDER — ACETAMINOPHEN 325 MG PO TABS
650.0000 mg | ORAL_TABLET | Freq: Four times a day (QID) | ORAL | Status: DC | PRN
  Administered 2020-01-30 – 2020-02-04 (×4): 650 mg via ORAL
  Filled 2020-01-29 (×4): qty 2

## 2020-01-29 MED ORDER — SODIUM CHLORIDE 0.9 % IV SOLN: 250.0000 mL | INTRAVENOUS | Status: DC | PRN

## 2020-01-29 MED ORDER — SODIUM CHLORIDE 0.9 % IV SOLN
200.0000 mg | Freq: Once | INTRAVENOUS | Status: AC
  Administered 2020-01-29: 17:00:00 200 mg via INTRAVENOUS
  Filled 2020-01-29: qty 200

## 2020-01-29 MED ORDER — ASCORBIC ACID 500 MG PO TABS
500.0000 mg | ORAL_TABLET | Freq: Every day | ORAL | Status: DC
  Administered 2020-01-29 – 2020-02-04 (×7): 500 mg via ORAL
  Filled 2020-01-29 (×7): qty 1

## 2020-01-29 MED ORDER — ADULT MULTIVITAMIN W/MINERALS CH
1.0000 | ORAL_TABLET | Freq: Every day | ORAL | Status: DC
  Administered 2020-01-29 – 2020-02-04 (×7): 1 via ORAL
  Filled 2020-01-29 (×7): qty 1

## 2020-01-29 MED ORDER — ONDANSETRON HCL 4 MG/2ML IJ SOLN: 4.0000 mg | Freq: Four times a day (QID) | INTRAMUSCULAR | Status: DC | PRN

## 2020-01-29 MED ORDER — LORAZEPAM 0.5 MG PO TABS
0.5000 mg | ORAL_TABLET | Freq: Every day | ORAL | Status: DC
  Administered 2020-01-29 – 2020-02-03 (×6): 0.5 mg via ORAL
  Filled 2020-01-29 (×7): qty 1

## 2020-01-29 MED ORDER — ENOXAPARIN SODIUM 40 MG/0.4ML ~~LOC~~ SOLN
40.0000 mg | SUBCUTANEOUS | Status: DC
  Filled 2020-01-29: qty 0.4

## 2020-01-29 MED ORDER — ASPIRIN EC 81 MG PO TBEC
81.0000 mg | DELAYED_RELEASE_TABLET | Freq: Every day | ORAL | Status: DC
  Administered 2020-01-29 – 2020-02-04 (×7): 81 mg via ORAL
  Filled 2020-01-29 (×7): qty 1

## 2020-01-29 MED ORDER — TOPIRAMATE 25 MG PO TABS
50.0000 mg | ORAL_TABLET | Freq: Every day | ORAL | Status: DC
  Administered 2020-01-29 – 2020-02-04 (×7): 50 mg via ORAL
  Filled 2020-01-29 (×8): qty 2

## 2020-01-29 MED ORDER — ONDANSETRON HCL 4 MG PO TABS: 4.0000 mg | ORAL_TABLET | Freq: Four times a day (QID) | ORAL | Status: DC | PRN

## 2020-01-29 MED ORDER — SODIUM CHLORIDE 0.9 % IV BOLUS
500.0000 mL | Freq: Once | INTRAVENOUS | Status: AC
  Administered 2020-01-29: 500 mL via INTRAVENOUS

## 2020-01-29 MED ORDER — SODIUM CHLORIDE 0.9 % IV SOLN
100.0000 mg | Freq: Every day | INTRAVENOUS | Status: AC
  Administered 2020-01-30 – 2020-02-02 (×4): 100 mg via INTRAVENOUS
  Filled 2020-01-29 (×4): qty 20

## 2020-01-29 MED ORDER — PANTOPRAZOLE SODIUM 40 MG PO TBEC
40.0000 mg | DELAYED_RELEASE_TABLET | Freq: Two times a day (BID) | ORAL | Status: DC
  Administered 2020-01-29 – 2020-02-04 (×13): 40 mg via ORAL
  Filled 2020-01-29 (×13): qty 1

## 2020-01-29 MED ORDER — ORAL CARE MOUTH RINSE
15.0000 mL | Freq: Two times a day (BID) | OROMUCOSAL | Status: DC
  Administered 2020-01-29 – 2020-02-05 (×16): 15 mL via OROMUCOSAL

## 2020-01-29 MED ORDER — MOMETASONE FURO-FORMOTEROL FUM 100-5 MCG/ACT IN AERO
2.0000 | INHALATION_SPRAY | Freq: Two times a day (BID) | RESPIRATORY_TRACT | Status: DC
  Administered 2020-01-29 – 2020-01-30 (×2): 2 via RESPIRATORY_TRACT
  Filled 2020-01-29: qty 8.8

## 2020-01-29 MED ORDER — FERROUS SULFATE 325 (65 FE) MG PO TABS
325.0000 mg | ORAL_TABLET | Freq: Every day | ORAL | Status: DC
  Administered 2020-01-29 – 2020-02-03 (×6): 325 mg via ORAL
  Filled 2020-01-29 (×7): qty 1

## 2020-01-29 MED ORDER — SODIUM CHLORIDE 0.9% FLUSH
3.0000 mL | Freq: Two times a day (BID) | INTRAVENOUS | Status: DC
  Administered 2020-01-29 – 2020-02-05 (×15): 3 mL via INTRAVENOUS

## 2020-01-29 MED ORDER — ALBUTEROL SULFATE HFA 108 (90 BASE) MCG/ACT IN AERS
2.0000 | INHALATION_SPRAY | Freq: Four times a day (QID) | RESPIRATORY_TRACT | Status: DC
  Administered 2020-01-29 – 2020-02-04 (×25): 2 via RESPIRATORY_TRACT
  Filled 2020-01-29 (×3): qty 6.7

## 2020-01-29 NOTE — ED Provider Notes (Signed)
[facility] Emergency Department Provider Note   ____________________________________________   First MD Initiated Contact with Patient 01/06/2020 1019     (approximate)  I have reviewed the triage vital signs and the nursing notes.   HISTORY  Chief Complaint Shortness of Breath    HPI [name] is a 79 y.o. female reports that she is had symptoms of coronavirus for 10 days.  She was diagnosed about 8 days ago.  She is had decreased appetite fatigue and shortness of breath.  She is continuing to feel this way and has been having periods where she feels as though she is about to pass out over the last couple days.  She is not eating well.  She actually reports that she briefly passed out here at the ER, triage nurse also reporting that the patient had while they were triaging her a brief episode of unresponsiveness but was able to come to oriented thereafter, she did shake a little bit but did not seem to have typical seizure-like activity they suspect she passed out.  She does report shortness of breath.  No pain.   Past Medical History:  Diagnosis Date  . Acute diarrhea 04/08/2014  . Anxiety   . Asthma   . 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 Cancer [facility])    lymphoma/leukemia   . Chronic migraine without aura 03/13/2014   Last Assessment & Plan:  Headaches stable generally   . Depression   . Fibrocystic disease of breast   . GERD (gastroesophageal reflux disease)   . Hiatal hernia   . Hyperlipidemia   . Osteoporosis   . Peptic ulcer disease   . Personal history of chemotherapy   . Psoriasis   . Renal stone   . Syncope and collapse 10/29/2009   Qualifier: Diagnosis of  By: Rockey Situ MD, Tim      Patient Active Problem List   Diagnosis Date Noted  . Abdominal pain 09/29/2016  . 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 Low back pain 04/28/2015  . H/O adenomatous polyp of colon 04/30/2014  . Abdominal pain, epigastric  04/08/2014  . Acute diarrhea 04/08/2014  . Blood in feces 04/08/2014  . Chronic migraine without aura 03/13/2014  . 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 Hypercholesterolemia 02/05/2014  . 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 SYNCOPE AND COLLAPSE 10/29/2009  . CHEST PAIN UNSPECIFIED 10/29/2009    Past Surgical History:  Procedure Laterality Date  . APPENDECTOMY    . BREAST BIOPSY Left 03/15/2016   LN bx, CHRONIC  LYMPHOCYTIC LEUKEMIA/SMALL LYMPHOCYTIC LYMPHOMA.  Marland Kitchen BREAST EXCISIONAL BIOPSY Right 2005   neg  . BREAST EXCISIONAL BIOPSY Right 1990   neg  . COLONOSCOPY WITH PROPOFOL N/A 03/19/2017   Procedure: COLONOSCOPY WITH PROPOFOL;  Surgeon: Manya Silvas, MD;  Location: [facility] ENDOSCOPY;  Service: Endoscopy;  Laterality: N/A;  . ESOPHAGOGASTRODUODENOSCOPY (EGD) WITH PROPOFOL N/A 03/19/2017   Procedure: ESOPHAGOGASTRODUODENOSCOPY (EGD) WITH PROPOFOL;  Surgeon: Manya Silvas, MD;  Location: [facility] ENDOSCOPY;  Service: Endoscopy;  Laterality: N/A;  . EXCISION OF BREAST BIOPSY Left 2017   pos  . HERNIA REPAIR    . PARTIAL HYSTERECTOMY    . VESICOVAGINAL FISTULA CLOSURE W/ TAH      Prior to Admission medications   Medication Sig Start Date End Date Taking? Authorizing Provider  acetaminophen (TYLENOL) 325 MG tablet Take 2 tablets (650 mg total) by mouth every 6 (six) hours as needed for mild  pain (or Fever >/= 101). Patient not taking: Reported on 07/24/2017 09/30/16   Nicholes Mango, MD  aspirin EC 81 MG tablet Take 81 mg daily by mouth.    [provider]  azelastine (ASTELIN) 0.1 % nasal spray Place 1 spray into both nostrils daily as needed.  01/31/16 01/30/17  [provider]  baclofen (LIORESAL) 10 MG tablet Take 10 mg 3 (three) times daily by mouth.    [provider]  ferrous sulfate 325 (65 FE) MG tablet Take 325 mg by mouth at bedtime.    [provider]  fesoterodine (TOVIAZ) 4 MG TB24 tablet Take 1 tablet  (4 mg total) by mouth daily. 07/24/17   Zara Council A, PA-C  Fluticasone-Salmeterol (ADVAIR) 100-50 MCG/DOSE AEPB Inhale 1 puff into the lungs as needed.    [provider]  HYDROcodone-acetaminophen (NORCO/VICODIN) 5-325 MG tablet Take 1-2 tablets by mouth every 4 (four) hours as needed for moderate pain. Patient not taking: Reported on 07/24/2017 09/30/16   Nicholes Mango, MD  LORazepam (ATIVAN) 0.5 MG tablet Take 0.5 mg by mouth at bedtime.     [provider]  metoprolol tartrate (LOPRESSOR) 25 MG tablet Take 0.5 tablets (12.5 mg total) by mouth 2 (two) times daily. Patient not taking: Reported on 07/24/2017 09/30/16   Nicholes Mango, MD  Multiple Vitamin (MULTI-VITAMINS) TABS Take by mouth.    [provider]  Multiple Vitamins-Minerals (CENTRUM WOMEN) TABS Take 1 tablet by mouth at bedtime.    [provider]  pantoprazole (PROTONIX) 40 MG tablet Take 40 mg by mouth 2 (two) times daily.  05/28/15   [provider]  pravastatin (PRAVACHOL) 80 MG tablet Take 40 mg by mouth at bedtime.  07/26/15   [provider]  senna-docusate (SENOKOT-S) 8.6-50 MG tablet Take 1 tablet by mouth at bedtime as needed for mild constipation. Patient not taking: Reported on 07/24/2017 09/30/16   Nicholes Mango, MD  sertraline (ZOLOFT) 50 MG tablet Take 50 mg by mouth daily.  06/01/15   [provider]  topiramate (TOPAMAX) 50 MG tablet Take 50 mg by mouth daily. 05/24/17   [provider]    Allergies Patient has no known allergies.  Family History  Problem Relation Age of Onset  . Hematuria Father   . Congestive Heart Failure Father   . Dementia Mother   . Congestive Heart Failure Mother   . Neuropathy Brother   . Coronary artery disease Other   . Breast cancer Maternal Aunt   . Breast cancer Paternal Aunt   . Breast cancer Maternal Aunt     Social History Social History   Tobacco Use  . Smoking status: Never Smoker  . Smokeless tobacco:  Never Used  Substance Use Topics  . Alcohol use: No  . Drug use: No    Review of Systems Constitutional: Fatigue weakness Eyes: No visual changes. ENT: No sore throat. Cardiovascular: Denies chest pain. Respiratory: Shortness of breath dry cough Gastrointestinal: No abdominal pain.  Decreased appetite nausea Genitourinary: Negative for dysuria. Musculoskeletal: Some pain across her back been present for about 10 days Skin: Negative for rash noted. Neurological: Negative for headaches, areas of focal weakness or numbness.    ____________________________________________   PHYSICAL EXAM:  VITAL SIGNS: ED Triage Vitals  Enc Vitals Group     BP 01/18/2020 0955 (!) 114/52     Pulse Rate 01/02/2020 0955 84     Resp 01/13/2020 0955 18     Temp 01/12/2020 1001 98.1  F (36.7 C)     Temp Source 01/19/2020 0955 Oral     SpO2 01/11/2020 0955 94 %     Weight 01/20/2020 1002 170 lb (77.1 kg)     Height 01/20/2020 1002 5\' 7"  (1.702 m)     Head Circumference --      Peak Flow --      Pain Score 01/09/2020 1001 8     Pain Loc --      Pain Edu? --      Excl. in [place]? --     Constitutional: Alert and oriented.  Fatigued but no acute distress.  Fully alert and well-oriented Eyes: Conjunctivae are normal. Head: Atraumatic. Nose: No congestion/rhinnorhea. Mouth/Throat: Mucous membranes are dry. Neck: No stridor.  Cardiovascular: Normal rate, regular rhythm. Grossly normal heart sounds.  Good peripheral circulation. Respiratory: Very slight tachypnea.  Lung sounds clear bilaterally.  No wheezes rales.  Speaks in phrases Gastrointestinal: Soft and nontender. No distention. Musculoskeletal: No lower extremity tenderness nor edema. Neurologic:  Normal speech and language. No gross focal neurologic deficits are appreciated.  Skin:  Skin is warm, dry and intact. No rash noted. Psychiatric: Mood and affect are normal. Speech and behavior are normal.  ____________________________________________    LABS (all labs ordered are listed, but only abnormal results are displayed)  Labs Reviewed  GLUCOSE, CAPILLARY - Abnormal; Notable for the following components:      Result Value   Glucose-Capillary 135 (*)    All other components within normal limits  BASIC METABOLIC PANEL - Abnormal; Notable for the following components:   Sodium 132 (*)    Glucose, Bld 130 (*)    Calcium 8.6 (*)    GFR calc non Af Amer 55 (*)    All other components within normal limits  CBC WITH DIFFERENTIAL/PLATELET - Abnormal; Notable for the following components:   WBC 3.3 (*)    HCT 35.6 (*)    MCV 79.5 (*)    RDW 17.6 (*)    All other components within normal limits  CULTURE, BLOOD (SINGLE)  CULTURE, BLOOD (SINGLE)  LACTIC ACID, PLASMA  PROCALCITONIN  LACTIC ACID, PLASMA  PROTIME-INR  APTT  URINALYSIS, COMPLETE (UACMP) WITH MICROSCOPIC  BLOOD GAS, VENOUS   ____________________________________________  EKG  Reviewed interpreted at 955 Heart rate 79 QRS 120 QTc 450 Left bundle branch block.  No scar [place] ____________________________________________  RADIOLOGY  DG Chest Port 1 View  Result Date: 01/05/2020 CLINICAL DATA:  Sent to ER for shortness of breath. Date 10 of COVID, also with history of CLL EXAM: PORTABLE CHEST 1 VIEW COMPARISON:  October 02, 2009. FINDINGS: RIGHT sided Port-A-Cath with tip at the caval to atrial junction Trachea midline. Cardiomediastinal contours are stable with mild cardiac enlargement. Hilar structures grossly normal though partially obscured by bilateral interstitial and airspace opacities in the mid and lower chest. Disease shows peripheral predominance but with some changes in the RIGHT mid chest that extend from the hilum. Large hiatal hernia. Subtle ovoid area of added density projecting over the LEFT scapula in the LEFT upper lobe. No acute skeletal process on limited assessment. IMPRESSION: 1. Findings of multifocal pneumonia in this patient with history of COVID-19  infection. 2. Some ovoid area of added density suggested in the LEFT upper lobe as well. Suggest follow-up to ensure resolution and exclude presence of underlying pulmonary nodule. 3. Hiatal hernia. Electronically Signed   By: Zetta Bills M.D.   On: 01/04/2020 10:45    Imaging concerning for multifocal  pneumonia also concern for possible left upper lobe abnormality possible nodule ____________________________________________   PROCEDURES  Procedure(s) performed: None  Procedures  Critical Care performed: No   ____________________________________________   INITIAL IMPRESSION / ASSESSMENT AND PLAN / ED COURSE  Pertinent labs & imaging results that were available during my care of the patient were reviewed by me and considered in my medical decision making (see chart for details).   Patient presents for concerns of shortness of breath worsening with associated feeling of lightheadedness possibly having a syncopal episode does not sound like a seizure.  She is fully alert and oriented no headache.  Complains of achiness in her back also shortness of breath and cough diagnosed positive with COVID-19 confirmed through care everywhere.  She is at approximately day 10 of illness at this time, given her hypoxia which corrects well with 4 L nasal cannula, we will admit to the hospital for further care and management under the hospitalist service.    ----------------------------------------- 11:23 AM on 01/09/2020 -----------------------------------------  Labs are reviewed, chemistry reassuring though slightly low sodium likely due to decreased intake.  Hydrating.  White count low, leukopenia I suspect viral suppression.  Platelet count pending.  Await procalcitonin, chest x-ray concerning for multifocal pneumonia likely consistent with COVID-19  ____________________________________________   FINAL CLINICAL IMPRESSION(S) / ED DIAGNOSES  Final diagnoses:  COVID-19  Hypoxia  Syncope  and collapse    Admission discussed with Dr. Francine Graven.  Will admit the patient, understanding that further testing such as procalcitonin is pending.    Note:  This document was prepared using Systems analyst and may include unintentional dictation errors       Delman Kitten, MD 01/04/2020 1539

## 2020-01-29 NOTE — Consult Note (Signed)
Remdesivir - Pharmacy Brief Note    A/P:  Remdesivir 200 mg IVPB once followed by 100 mg IVPB daily x 4 days.   Kristeen Miss, PharmD Clinical Pharmacist  12/31/2019 11:49 AM

## 2020-01-29 NOTE — Progress Notes (Signed)
Patient had Baclofen on her medication list to be given. Medication was given and later saw that the Baclofen was on her allergy list. Messaged MD and made her aware of same, MD discontinued medication. Will continue to monitor.

## 2020-01-29 NOTE — ED Notes (Signed)
During triage, the pt went unresponsive with gaze, clenched jaw and slight tremors noted for approximately 2 min.

## 2020-01-29 NOTE — ED Triage Notes (Addendum)
Pt sent from [facility] for SOB, pt is on day 10 of covid and was going there for the antibody infusion but her O2 on RA was 84%, pt denies fevers, states she has had a cough with increased SOB.  Pt is in NAD.Marland Kitchenpt c/o back pain and the level of her bra strap since she started having covid sx.

## 2020-01-29 NOTE — H&P (Signed)
History and Physical    [name] QPY:[id number] DOB: 1941/04/04 DOA: 01/24/2020  PCP: [name], MD   Patient coming from: Home  I have personally briefly reviewed patient's old medical records in [place]  Chief Complaint: Shortness of breath  HPI: [name] is a 79 y.o. 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  Patient was diagnosed with COVID-19 viral infection about 8 days prior to her hospitalization.  Her positive test was on 01/21/20.  She has had poor oral intake, nausea, diarrhea and shortness of breath and has actually felt like she was going to pass out over the last couple of days.  While in the ER patient did have a witnessed syncopal episode which was transient.  Patient was referred to the emergency room from [facility] clinic where she had gone to get the monoclonal antibody for COVID-19.  She was noted to have room air pulse oximetry of 84% and so was sent to the emergency room for further evaluation. Shortness of breath is associated with a dry cough, nausea, vomiting and diarrhea as well as poor oral intake. VBG 7.38/38/33/22.5/62.1 Sodium 132, potassium 3.8, chloride 100, bicarb 22, glucose 130, BUN 18, creatinine 0.97, calcium 8.6, lactic acid 1.5, procalcitonin 0.22, white count 3.3, hemoglobin 12.2, hematocrit 35.6, MCV 79.5, RDW 17.6, platelet count 73 Chest x-ray reviewed by me shows findings of multifocal pneumonia  Twelve-lead EKG reviewed by me shows normal sinus rhythm with a left bundle branch block  ED Course: Patient is an 79 year old female with a history of lymphoma/leukemia last chemotherapy was in July who presents to the ER for evaluation of worsening shortness of breath and hypoxia.  Patient has had symptoms of COVID-19 viral infection for about 10 days and had gone to an outpatient  clinic to receive monoclonal antibody but was referred to the emergency room because she was hypoxic with room air pulse oximetry of 84%. She is currently on oxygen, 4 L nasal cannula with improvement in her pulse oximetry. While in the emergency room she had a witnessed syncopal episode.  She will be admitted to the hospital for further evaluation.     Review of Systems: As per HPI otherwise 10 point review of systems negative.    Past Medical History:  Diagnosis Date  . Acute diarrhea 04/08/2014  . Anxiety   . Asthma   . 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 Cancer [facility])    lymphoma/leukemia   . Chronic migraine without aura 03/13/2014   Last Assessment & Plan:  Headaches stable generally   . Depression   . Fibrocystic disease of breast   . GERD (gastroesophageal reflux disease)   . Hiatal hernia   . Hyperlipidemia   . Osteoporosis   . Peptic ulcer disease   . Personal history of chemotherapy   . Psoriasis   . Renal stone   . Syncope and collapse 10/29/2009   Qualifier: Diagnosis of  By: Rockey Situ MD, Tim      Past Surgical History:  Procedure Laterality Date  . APPENDECTOMY    . BREAST BIOPSY Left 03/15/2016   LN bx, CHRONIC  LYMPHOCYTIC LEUKEMIA/SMALL LYMPHOCYTIC LYMPHOMA.  Marland Kitchen BREAST EXCISIONAL BIOPSY Right 2005   neg  . BREAST EXCISIONAL BIOPSY Right 1990   neg  . COLONOSCOPY WITH PROPOFOL N/A 03/19/2017   Procedure: COLONOSCOPY WITH PROPOFOL;  Surgeon: Manya Silvas, MD;  Location: [facility] ENDOSCOPY;  Service: Endoscopy;  Laterality: N/A;  . ESOPHAGOGASTRODUODENOSCOPY (EGD) WITH PROPOFOL N/A 03/19/2017   Procedure: ESOPHAGOGASTRODUODENOSCOPY (EGD) WITH PROPOFOL;  Surgeon: Manya Silvas, MD;  Location: [facility] ENDOSCOPY;  Service: Endoscopy;  Laterality: N/A;  . EXCISION OF BREAST BIOPSY Left 2017   pos  . HERNIA REPAIR    . PARTIAL HYSTERECTOMY    . VESICOVAGINAL FISTULA CLOSURE W/ TAH       reports that she has never smoked. She has never used smokeless  tobacco. She reports that she does not drink alcohol and does not use drugs.  Allergies  Allergen Reactions  . Baclofen Other (See Comments)    "made me go crazy" "made me go crazy"     Family History  Problem Relation Age of Onset  . Hematuria Father   . Congestive Heart Failure Father   . Dementia Mother   . Congestive Heart Failure Mother   . Neuropathy Brother   . Coronary artery disease Other   . Breast cancer Maternal Aunt   . Breast cancer Paternal Aunt   . Breast cancer Maternal Aunt      Prior to Admission medications   Medication Sig Start Date End Date Taking? Authorizing Provider  amLODipine (NORVASC) 5 MG tablet Take 5 mg by mouth daily. 08/13/19  Yes [provider]  aspirin EC 81 MG tablet Take 81 mg daily by mouth.   Yes [provider]  azithromycin (ZITHROMAX) 250 MG tablet Take 250-500 mg by mouth daily. 01/21/20  Yes [provider]  entecavir (BARACLUDE) 0.5 MG tablet Take 0.5 mg by mouth daily. 01/14/20  Yes [provider]  furosemide (LASIX) 20 MG tablet Take 20 mg by mouth daily as needed. 11/27/19  Yes [provider]  LORazepam (ATIVAN) 0.5 MG tablet Take 0.5 mg by mouth at bedtime.    Yes [provider]  mirtazapine (REMERON) 7.5 MG tablet Take 7.5 mg by mouth every evening. 10/30/19  Yes [provider]  Multiple Vitamin (MULTI-VITAMIN) tablet Take 1 tablet by mouth daily.   Yes [provider]  pantoprazole (PROTONIX) 40 MG tablet Take 40 mg by mouth 2 (two) times daily.  05/28/15  Yes [provider]  sertraline (ZOLOFT) 50 MG tablet Take 50 mg by mouth daily.  06/01/15  Yes [provider]  Butalbital-APAP-Caffeine 50-300-40 MG CAPS Take 1 capsule by mouth every 4 (four) hours as needed for migraine. 08/13/19   [provider]    Physical Exam: Vitals:   01/04/2020 1200 01/04/2020 1230 01/22/2020 1300 01/05/2020 1429  BP: (!) 132/59 (!) 124/47 (!) 121/56 131/61   Pulse: 78 75 78 83  Resp: (!) 35  (!) 38 (!) 21  Temp:    98.5 F (36.9 C)  TempSrc:    Oral  SpO2: 90% 93% 93% (!) 72%  Weight:      Height:         Vitals:   01/19/2020 1200 01/27/2020 1230 01/03/2020 1300 01/08/2020 1429  BP: (!) 132/59 (!) 124/47 (!) 121/56 131/61  Pulse: 78 75 78 83  Resp: (!) 35  (!) 38 (!) 21  Temp:    98.5 F (36.9 C)  TempSrc:    Oral  SpO2: 90% 93% 93% (!) 72%  Weight:      Height:        Constitutional: NAD, alert and oriented x 3.  Acutely ill-appearing, weak Eyes: PERRL, lids and conjunctivae normal ENMT: Mucous membranes are moist.  Neck:  normal, supple, no masses, no thyromegaly Respiratory: Bilateral air entry, no wheezing, no crackles. Normal respiratory effort. No accessory muscle use.  Cardiovascular: Regular rate and rhythm, no murmurs / rubs / gallops. No extremity edema. 2+ pedal pulses. No carotid bruits.  Abdomen: no tenderness, no masses palpated. No hepatosplenomegaly. Bowel sounds positive.  Musculoskeletal: no clubbing / cyanosis. No joint deformity upper and lower extremities.  Skin: no rashes, lesions, ulcers.  Neurologic: No gross focal neurologic deficit.  Generalized weakness Psychiatric: Normal mood and affect.   Labs on Admission: I have personally reviewed following labs and imaging studies  CBC: Recent Labs  Lab 01/27/2020 1023  WBC 3.3*  NEUTROABS 2.5  HGB 12.2  HCT 35.6*  MCV 79.5*  PLT 73*   Basic Metabolic Panel: Recent Labs  Lab 01/04/2020 1023  NA 132*  K 3.8  CL 100  CO2 22  GLUCOSE 130*  BUN 18  CREATININE 0.97  CALCIUM 8.6*   GFR: Estimated Creatinine Clearance: 48.7 mL/min (by C-G formula based on SCr of 0.97 mg/dL). Liver Function Tests: No results for input(s): AST, ALT, ALKPHOS, BILITOT, PROT, ALBUMIN in the last 168 hours. No results for input(s): LIPASE, AMYLASE in the last 168 hours. No results for input(s): AMMONIA in the last 168 hours. Coagulation Profile: Recent Labs  Lab  01/01/2020 1023  INR 1.0   Cardiac Enzymes: No results for input(s): CKTOTAL, CKMB, CKMBINDEX, TROPONINI in the last 168 hours. BNP (last 3 results) No results for input(s): PROBNP in the last 8760 hours. HbA1C: No results for input(s): HGBA1C in the last 72 hours. CBG: Recent Labs  Lab 01/01/2020 1000  GLUCAP 135*   Lipid Profile: No results for input(s): CHOL, HDL, LDLCALC, TRIG, CHOLHDL, LDLDIRECT in the last 72 hours. Thyroid Function Tests: No results for input(s): TSH, T4TOTAL, FREET4, T3FREE, THYROIDAB in the last 72 hours. Anemia Panel: No results for input(s): VITAMINB12, FOLATE, FERRITIN, TIBC, IRON, RETICCTPCT in the last 72 hours. Urine analysis:    Component Value Date/Time   COLORURINE YELLOW (A) 11/25/2015 1125   APPEARANCEUR CLEAR (A) 11/25/2015 1125   APPEARANCEUR Clear 10/01/2015 0909   LABSPEC 1.014 11/25/2015 1125   PHURINE 6.0 11/25/2015 1125   GLUCOSEU NEGATIVE 11/25/2015 1125   HGBUR NEGATIVE 11/25/2015 1125   BILIRUBINUR NEGATIVE 11/25/2015 1125   BILIRUBINUR Negative 10/01/2015 0909   KETONESUR NEGATIVE 11/25/2015 1125   PROTEINUR NEGATIVE 11/25/2015 1125   NITRITE NEGATIVE 11/25/2015 1125   LEUKOCYTESUR 2+ (A) 11/25/2015 1125   LEUKOCYTESUR 1+ (A) 10/01/2015 0909    Radiological Exams on Admission: DG Chest Port 1 View  Result Date: 01/09/2020 CLINICAL DATA:  Sent to ER for shortness of breath. Date 10 of COVID, also with history of CLL EXAM: PORTABLE CHEST 1 VIEW COMPARISON:  October 02, 2009. FINDINGS: RIGHT sided Port-A-Cath with tip at the caval to atrial junction Trachea midline. Cardiomediastinal contours are stable with mild cardiac enlargement. Hilar structures grossly normal though partially obscured by bilateral interstitial and airspace opacities in the mid and lower chest. Disease shows peripheral predominance but with some changes in the RIGHT mid chest that extend from the hilum. Large hiatal hernia. Subtle ovoid area of added density  projecting over the LEFT scapula in the LEFT upper lobe. No acute skeletal process on limited assessment. IMPRESSION: 1. Findings of multifocal pneumonia in this patient with history of COVID-19 infection. 2. Some ovoid area of added density suggested in the LEFT upper lobe as well. Suggest follow-up to ensure resolution and exclude presence  of underlying pulmonary nodule. 3. Hiatal hernia. Electronically Signed   By: Zetta Bills M.D.   On: 01/01/2020 10:45    EKG: Independently reviewed.  Sinus rhythm Left bundle branch block  Assessment/Plan Principal Problem:   Pneumonia due to COVID-19 virus Active Problems:   Acute respiratory failure due to COVID-19 [facility])   Syncope and collapse   CLL (chronic lymphocytic leukemia) (HCC)   Thrombocytopenia (HCC)     Pneumonia due to COVID-19 virus with acute respiratory failure Patient is unvaccinated and has had symptoms for about 10 days prior to hospitalization Her COVID-19 PCR test was positive on 01/21/20 She presents to the ER for evaluation of worsening shortness of breath and was hypoxic with room air pulse oximetry of 84%.  She is currently on 4 L of oxygen with pulse oximetry greater than 92% We will place patient on remdesivir per protocol Place patient on IV Decadron Supportive care with bronchodilator therapy and antitussives   Syncope and collapse Most likely vasovagal as well as secondary to volume depletion from poor oral intake and GI losses We will place patient on cardiac monitor to rule out arrhythmias Obtain 2D echocardiogram   Depression Continue sertraline and lorazepam   Thrombocytopenia Most likely related to patient's known history of lymphoma/leukemia Monitor closely for bleeding    DVT prophylaxis: SCD Code Status: Full code Family Communication: Greater than 50% of time was spent discussing patient's condition and plan of care with her at the bedside.  All questions and concerns have been addressed.   She verbalizes understanding and agrees with the plan.  CODE STATUS was discussed and she is a full code Disposition Plan: Back to previous home environment Consults called: None    Oluwadamilola Deliz MD [facility]     01/09/2020, 2:59 PM

## 2020-01-29 NOTE — Progress Notes (Signed)
Telemetry called and stated that patient had an ST elevation segment >2 in V lead at 1634 and has had 3 more since. Messaged MD, new order for EKG.

## 2020-01-30 ENCOUNTER — Inpatient Hospital Stay (HOSPITAL_COMMUNITY)
Admit: 2020-01-30 | Discharge: 2020-01-30 | Disposition: A | Discharge status: Home / Self Care | Payer: Medicare HMO | Attending: Internal Medicine | Admitting: Internal Medicine

## 2020-01-30 DIAGNOSIS — I361 Nonrheumatic tricuspid (valve) insufficiency: Secondary | ICD-10-CM

## 2020-01-30 DIAGNOSIS — I350 Nonrheumatic aortic (valve) stenosis: Secondary | ICD-10-CM

## 2020-01-30 DIAGNOSIS — U071 COVID-19: Secondary | ICD-10-CM | POA: Diagnosis not present

## 2020-01-30 DIAGNOSIS — I340 Nonrheumatic mitral (valve) insufficiency: Secondary | ICD-10-CM

## 2020-01-30 DIAGNOSIS — J1282 Pneumonia due to Coronavirus disease 2019: Secondary | ICD-10-CM | POA: Diagnosis not present

## 2020-01-30 LAB — CBC WITH DIFFERENTIAL/PLATELET
Abs Immature Granulocytes: 0.01 10*3/uL (ref 0.00–0.07)
Basophils Absolute: 0 10*3/uL (ref 0.0–0.1)
Basophils Relative: 0 %
Eosinophils Absolute: 0 10*3/uL (ref 0.0–0.5)
Eosinophils Relative: 0 %
HCT: 31.9 % — ABNORMAL LOW (ref 36.0–46.0)
Hemoglobin: 10.4 g/dL — ABNORMAL LOW (ref 12.0–15.0)
Immature Granulocytes: 1 %
Lymphocytes Relative: 21 %
Lymphs Abs: 0.3 10*3/uL — ABNORMAL LOW (ref 0.7–4.0)
MCH: 26.9 pg (ref 26.0–34.0)
MCHC: 32.6 g/dL (ref 30.0–36.0)
MCV: 82.4 fL (ref 80.0–100.0)
Monocytes Absolute: 0.1 10*3/uL (ref 0.1–1.0)
Monocytes Relative: 7 %
Neutro Abs: 1.1 10*3/uL — ABNORMAL LOW (ref 1.7–7.7)
Neutrophils Relative %: 71 %
Platelets: 61 10*3/uL — ABNORMAL LOW (ref 150–400)
RBC: 3.87 MIL/uL (ref 3.87–5.11)
RDW: 17.4 % — ABNORMAL HIGH (ref 11.5–15.5)
WBC: 1.5 10*3/uL — ABNORMAL LOW (ref 4.0–10.5)
nRBC: 0 % (ref 0.0–0.2)

## 2020-01-30 LAB — TROPONIN I (HIGH SENSITIVITY): Troponin I (High Sensitivity): 19 ng/L — ABNORMAL HIGH (ref <18)

## 2020-01-30 LAB — ECHOCARDIOGRAM COMPLETE
AR max vel: 1.11 cm2
AV Area VTI: 1.16 cm2
AV Area mean vel: 1.19 cm2
AV Mean grad: 12 mmHg
AV Peak grad: 23 mmHg
Ao pk vel: 2.4 m/s
Area-P 1/2: 3.4 cm2
Height: 67 in
S' Lateral: 2.45 cm
Weight: 2720 oz

## 2020-01-30 LAB — COMPREHENSIVE METABOLIC PANEL
ALT: 64 U/L — ABNORMAL HIGH (ref 0–44)
AST: 94 U/L — ABNORMAL HIGH (ref 15–41)
Albumin: 3 g/dL — ABNORMAL LOW (ref 3.5–5.0)
Alkaline Phosphatase: 314 U/L — ABNORMAL HIGH (ref 38–126)
Anion gap: 10 (ref 5–15)
BUN: 18 mg/dL (ref 8–23)
CO2: 21 mmol/L — ABNORMAL LOW (ref 22–32)
Calcium: 8.2 mg/dL — ABNORMAL LOW (ref 8.9–10.3)
Chloride: 104 mmol/L (ref 98–111)
Creatinine, Ser: 0.76 mg/dL (ref 0.44–1.00)
GFR calc Af Amer: >60 mL/min (ref >60)
GFR calc non Af Amer: >60 mL/min (ref >60)
Glucose, Bld: 120 mg/dL — ABNORMAL HIGH (ref 70–99)
Potassium: 3.9 mmol/L (ref 3.5–5.1)
Sodium: 135 mmol/L (ref 135–145)
Total Bilirubin: 0.7 mg/dL (ref 0.3–1.2)
Total Protein: 5.6 g/dL — ABNORMAL LOW (ref 6.5–8.1)

## 2020-01-30 LAB — MAGNESIUM: Magnesium: 1.9 mg/dL (ref 1.7–2.4)

## 2020-01-30 LAB — FIBRIN DERIVATIVES D-DIMER (ARMC ONLY): Fibrin derivatives D-dimer (ARMC): 1428.89 ng/mL (FEU) — ABNORMAL HIGH (ref 0.00–499.00)

## 2020-01-30 LAB — PHOSPHORUS: Phosphorus: 3.9 mg/dL (ref 2.5–4.6)

## 2020-01-30 LAB — C-REACTIVE PROTEIN: CRP: 3.8 mg/dL — ABNORMAL HIGH (ref <1.0)

## 2020-01-30 LAB — FERRITIN: Ferritin: 79 ng/mL (ref 11–307)

## 2020-01-30 NOTE — Evaluation (Signed)
Physical Therapy Evaluation Patient Details Name: [name] MRN: [id number] DOB: 08/20/1940 Today's Date: 01/30/2020   History of Present Illness  [name] is a 79 y.o. 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  Patient was diagnosed with COVID-19 viral infection about 8 days prior to her hospitalization.  Her positive test was on 01/21/20.  She has had poor oral intake, nausea, diarrhea and shortness of breath and has actually felt like she was going to pass out over the last couple of days.  While in the ER patient did have a witnessed syncopal episode which was transient.  Clinical Impression  Patient received getting up to [facility] with RN present. Patient with low O2 saturation on 4 lpm. (85%). Encouraged patient to perform pursed lip breathing, but difficult as patient is coughing almost constantly throughout mobility. O2 sats did drop into the mid 70%s at times. She requires min guard for basic transfers and bed mobility at this time. Gait deferred due to coughing and low O2 saturations. She performed a few bed exercises once returned to bed and coughing subsided. She will continue to benefit from skilled PT while here to improve functional mobility and strength to return home at discharge.      Follow Up Recommendations Home health PT;Supervision - Intermittent    Equipment Recommendations  Other (comment) (TBD)    Recommendations for Other Services       Precautions / Restrictions Precautions Precautions: Fall Precaution Comments: mod fall Restrictions Weight Bearing Restrictions: No      Mobility  Bed Mobility Overal bed mobility: Needs Assistance Bed Mobility: Supine to Sit;Sit to Supine     Supine to sit: Min guard Sit to supine: Min guard      Transfers Overall transfer level: Needs assistance Equipment used:  None Transfers: Sit to/from [facility] Sit to Stand: Min guard Stand pivot transfers: Min guard       General transfer comment: patient is generally steady with transfer, but O2 sats low with limited mobility  Ambulation/Gait             General Gait Details: not assessed this session  Stairs            Wheelchair Mobility    Modified Rankin (Stroke Patients Only)       Balance Overall balance assessment: Needs assistance Sitting-balance support: Feet supported Sitting balance-Leahy Scale: Good     Standing balance support: Single extremity supported;During functional activity Standing balance-Leahy Scale: Fair Standing balance comment: reliant on single UE support during transfers                             Pertinent Vitals/Pain Pain Assessment: No/denies pain    Home Living Family/patient expects to be discharged to:: Private residence Living Arrangements: Alone                    Prior Function Level of Independence: Independent         Comments: patient reports she did not use ad, diving prior to arrival     Hand Dominance        Extremity/Trunk Assessment   Upper Extremity Assessment Upper Extremity Assessment: Generalized weakness    Lower Extremity Assessment Lower Extremity Assessment: Generalized weakness    Cervical / Trunk Assessment Cervical / Trunk Assessment: Normal  Communication  Communication: No difficulties  Cognition Arousal/Alertness: Awake/alert Behavior During Therapy: WFL for tasks assessed/performed Overall Cognitive Status: Within Functional Limits for tasks assessed                                        General Comments      Exercises Other Exercises Other Exercises: BLE: AP, heel slides, hip abd/add x 10 reps   Assessment/Plan    PT Assessment Patient needs continued PT services  PT Problem List Decreased strength;Decreased mobility;Decreased  activity tolerance;Decreased balance;Cardiopulmonary status limiting activity       PT Treatment Interventions DME instruction;Therapeutic activities;Gait training;Therapeutic exercise;Patient/family education;Balance training;Functional mobility training    PT Goals (Current goals can be found in the Care Plan section)  Acute Rehab PT Goals Patient Stated Goal: to feel better PT Goal Formulation: With patient Time For Goal Achievement: 02/13/20 Potential to Achieve Goals: Fair    Frequency Min 2X/week   Barriers to discharge Decreased caregiver support      Co-evaluation               AM-PAC PT "6 Clicks" Mobility  Outcome Measure Help needed turning from your back to your side while in a flat bed without using bedrails?: A Little Help needed moving from lying on your back to sitting on the side of a flat bed without using bedrails?: A Little Help needed moving to and from a bed to a chair (including a wheelchair)?: A Little Help needed standing up from a chair using your arms (e.g., wheelchair or bedside chair)?: A Little Help needed to walk in hospital room?: A Lot Help needed climbing 3-5 steps with a railing? : A Lot 6 Click Score: 16    End of Session Equipment Utilized During Treatment: Oxygen Activity Tolerance: Patient limited by fatigue;Treatment limited secondary to medical complications (Comment);Other (comment) (Patients O2 sats in 70-80%s with mobility. Heavy coughing throughout mobility as well.) Patient left: in bed;with call bell/phone within reach Nurse Communication: Mobility status PT Visit Diagnosis: Muscle weakness (generalized) (M62.81);Other abnormalities of gait and mobility (R26.89);Difficulty in walking, not elsewhere classified (R26.2)    Time: 9528-4132 PT Time Calculation (min) (ACUTE ONLY): 19 min   Charges:   PT Evaluation $PT Eval Moderate Complexity: 1 Mod PT Treatments $Therapeutic Activity: 8-22 mins        Zaidin Blyden,  PT, GCS 01/30/20,12:17 PM

## 2020-01-30 NOTE — Progress Notes (Addendum)
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  Patient was initially diagnosed with Covid-19 on 01/21/2020.  Patient was to receive the monoclonal antibody for Covid-19 when she was noted to have hypoxia with SPO2 84% on room air, and was directed to the emergency department for further evaluation and treatment.  In the ED, temperature 98.1, BP 114/52, HR 84, RR 18, SPO2 72 % on room air.  2, potassium 3.8, chloride 100, CO2 22, glucose 130, BUN 18, creatinine 0.97.  The BC count 33, hemoglobin 12.2, platelets 73.  Procalcitonin 0.22, lactic acid 1.5, troponin 28.  X-ray with findings of multifocal pneumonia and ovoid density left upper lobe.  NSR, rate 67, no concerning ST elevation/depressions or T wave inversions.  Given her hypoxia, patient was started on Covid-19 treatment protocol.  Hospitalist service requested for admission.   Assessment & Plan:   Principal Problem:   Pneumonia due to COVID-19 virus Active Problems:   Syncope and collapse   CLL (chronic lymphocytic leukemia) (HCC)   Acute respiratory failure due to COVID-19 (HCC)   Thrombocytopenia (HCC)   Acute hypoxic respiratory failure secondary to acute Covid-19 viral pneumonia during the ongoing 2020/2021 Covid 19 Pandemic - POA Patient presenting to ED with progressive shortness of breath.  Was to receive monoclonal antibody infusion for known diagnosis of Covid-19 on 01/21/2020.  During that evaluation, patient was noted to be hypoxic on room air; was subsequently directed to the ED for further evaluation.  Chest x-ray notable for multifocal pneumonia.  Elevated D-dimer and CRP. --COVID test: + 01/21/2020 --CRP 3.8 --ddimer  1428 --Remdesivir, plan 5-day course (Day #2/5) --Decadron 6 mg IV daily --prone for 2-3hrs every 12hrs if able --Continue supplemental oxygen, titrate to maintain SPO2 greater than 92%, on 6 L nasal cannula this morning, turned down to 4 L nasal cannula with SPO2 94%. --Continue supportive care with albuterol MDI prn, vitamin C, zinc, Tylenol, antitussives (benzonatate/ Mucinex/Tussionex) --Follow CBC, CMP, D-dimer, ferritin, and CRP daily --Continue airborne/contact isolation precautions for 3 weeks from the day of diagnosis  The treatment plan and use of medications and known side effects were discussed with patient/family. Some of the medications used are based on case reports/anecdotal data.  All other medications being used in the management of COVID-19 based on limited study data.  Complete risks and long-term side effects are unknown, however in the best clinical judgment they seem to be of some benefit.  Patient wanted to proceed with treatment options provided.  Left upper lobe density Chest x-ray on admission notable for ovoid density left upper lobe. --Recommend follow-up chest x-ray versus CT chest outpatient for further evaluation/interval resolution for concern of possible nodule.  Syncopal episode Patient was witnessed to have a syncopal episode while in the ED, likely related to severe hypoxia.  Echocardiogram with LVEF 55-60%, no regional wall motion normalities LV, mild LVH, grade 1 diastolic dysfunction, mild MR. --Continue monitor on telemetry --PT/OT evaluation  Lymphoma/leukemia Follows with medical oncology at [facility].  On chemotherapy outpatient which is currently on hold since diagnosis of Covid-19. --Continue follow-up with medical oncology on discharge.  Depression/anxiety --Sertraline 50 mg p.o. daily --Lorazepam 0.5 mg qHS  Iron deficiency anemia:  Hemoglobin 10.4, stable. --Continue  ferrous sulfate 325 mg p.o.  daily  Hyperlipidemia: Continue pravastatin 40 mg p.o. daily  GERD: Continue PPI   DVT prophylaxis: SCDs Code Status: Full code Family Communication: Updated patient extensively at bedside, updated patients son [name] via telephone this afternoon.  Disposition Plan:  Status is: Inpatient  Remains inpatient appropriate because:Ongoing diagnostic testing needed not appropriate for outpatient work up, Unsafe d/c plan, IV treatments appropriate due to intensity of illness or inability to take PO and Inpatient level of care appropriate due to severity of illness   Dispo: The patient is from: Home              Anticipated d/c is to: Home              Anticipated d/c date is: 3 days              Patient currently is not medically stable to d/c.   Consultants:   None  Procedures:   Transthoracic echocardiogram 01/30/2020: IMPRESSIONS  1. Left ventricular ejection fraction, by estimation, is 55 to 60%. The  left ventricle has normal function. The left ventricle has no regional  wall motion abnormalities. There is mild left ventricular hypertrophy.  Left ventricular diastolic parameters  are consistent with Grade I diastolic dysfunction (impaired relaxation).  2. Right ventricular systolic function is normal. The right ventricular  size is normal.  3. Mild mitral valve regurgitation.    Antimicrobials:   None   Subjective: Patient seen and examined bedside, resting comfortably.  Continues with shortness of breath and on 6 L nasal cannula.  No other complaints or concerns at this time.  Denies headache, no dizziness, no chest pain, palpitations, no abdominal pain, no fever/chills/night sweats, no nausea/vomiting/diarrhea.  Objective: Vitals:   01/30/20 0111 01/30/20 0545 01/30/20 0826 01/30/20 1252  BP: 134/62 (!) 136/57 (!) 112/50 (!) 136/57  Pulse: 70 79 64 93  Resp: 17  20 20   Temp: 98.7 F (37.1 C) 99.1 F (37.3 C) 98.5 F (36.9 C) 99.5 F (37.5 C)  TempSrc: Oral  Oral Oral Axillary  SpO2: 93% 94% 93% 91%  Weight:      Height:        Intake/Output Summary (Last 24 hours) at 01/30/2020 1408 Last data filed at 01/02/2020 2230 Gross per 24 hour  Intake 841.13 ml  Output --  Net 841.13 ml   Filed Weights   01/19/2020 1002  Weight: 77.1 kg    Examination:  General exam: Appears calm and comfortable  Respiratory system: Clear to auscultation. Respiratory effort normal.  On 6 L nasal cannula with SPO2 94%. Cardiovascular system: S1 & S2 heard, RRR. No JVD, murmurs, rubs, gallops or clicks. No pedal edema. Gastrointestinal system: Abdomen is nondistended, soft and nontender. No organomegaly or masses felt. Normal bowel sounds heard. Central nervous system: Alert and oriented. No focal neurological deficits. Extremities: Symmetric 5 x 5 power. Skin: No rashes, lesions or ulcers Psychiatry: Judgement and insight appear normal. Mood & affect appropriate.     Data Reviewed: I have personally reviewed following labs and imaging studies  CBC: Recent Labs  Lab 01/28/2020 1023 01/30/20 0412  WBC 3.3* 1.5*  NEUTROABS 2.5 1.1*  HGB 12.2 10.4*  HCT 35.6* 31.9*  MCV 79.5* 82.4  PLT 73* 61*   Basic Metabolic Panel: Recent Labs  Lab 01/02/2020 1023 01/30/20 0412  NA 132* 135  K 3.8 3.9  CL 100 104  CO2 22 21*  GLUCOSE 130* 120*  BUN 18 18  CREATININE 0.97 0.76  CALCIUM 8.6* 8.2*  MG  --  1.9  PHOS  --  3.9   GFR: Estimated Creatinine Clearance: 59 mL/min (by C-G formula based on SCr of 0.76 mg/dL). Liver Function Tests: Recent Labs  Lab 01/30/20 0412  AST 94*  ALT 64*  ALKPHOS 314*  BILITOT 0.7  PROT 5.6*  ALBUMIN 3.0*   No results for input(s): LIPASE, AMYLASE in the last 168 hours. No results for input(s): AMMONIA in the last 168 hours. Coagulation Profile: Recent Labs  Lab 01/28/2020 1023  INR 1.0   Cardiac Enzymes: No results for input(s): CKTOTAL, CKMB, CKMBINDEX, TROPONINI in the last 168 hours. BNP (last 3  results) No results for input(s): PROBNP in the last 8760 hours. HbA1C: No results for input(s): HGBA1C in the last 72 hours. CBG: Recent Labs  Lab 01/15/2020 1000  GLUCAP 135*   Lipid Profile: No results for input(s): CHOL, HDL, LDLCALC, TRIG, CHOLHDL, LDLDIRECT in the last 72 hours. Thyroid Function Tests: No results for input(s): TSH, T4TOTAL, FREET4, T3FREE, THYROIDAB in the last 72 hours. Anemia Panel: Recent Labs    01/30/20 0412  FERRITIN 79   Sepsis Labs: Recent Labs  Lab 01/12/2020 1023 01/12/2020 1145  PROCALCITON 0.22  --   LATICACIDVEN 1.5 1.3    Recent Results (from the past 240 hour(s))  Blood culture (routine single)     Status: None (Preliminary result)   Collection Time: 01/28/2020 10:23 AM   Specimen: BLOOD LEFT ARM  Result Value Ref Range Status   Specimen Description BLOOD LEFT ARM  Final   Special Requests   Final    BOTTLES DRAWN AEROBIC AND ANAEROBIC Blood Culture adequate volume   Culture   Final    NO GROWTH < 24 HOURS Performed at [facility], [address], [place], [place] [address]    Report Status PENDING  Incomplete  Blood culture (single)     Status: None (Preliminary result)   Collection Time: 01/24/2020 11:45 AM   Specimen: BLOOD  Result Value Ref Range Status   Specimen Description BLOOD LEFT ANTECUBITAL  Final   Special Requests   Final    BOTTLES DRAWN AEROBIC AND ANAEROBIC Blood Culture adequate volume   Culture   Final    NO GROWTH < 24 HOURS Performed at [facility], [address] [place], [place] [address]    Report Status PENDING  Incomplete         Radiology Studies: DG Chest Port 1 View  Result Date: 01/05/2020 CLINICAL DATA:  Sent to ER for shortness of breath. Date 10 of COVID, also with history of CLL EXAM: PORTABLE CHEST 1 VIEW COMPARISON:  October 02, 2009. FINDINGS: RIGHT sided Port-A-Cath with tip at the caval to atrial junction Trachea midline. Cardiomediastinal contours are stable with mild  cardiac enlargement. Hilar structures grossly normal though partially obscured by bilateral interstitial and airspace opacities in the mid and lower chest. Disease shows peripheral predominance but with some changes in the RIGHT mid chest that extend from the hilum. Large hiatal hernia. Subtle ovoid area of added density projecting over the LEFT scapula in the LEFT upper lobe. No acute skeletal process on limited assessment. IMPRESSION: 1. Findings of multifocal pneumonia in this patient with history of COVID-19 infection. 2. Some ovoid area of added density suggested in the LEFT upper lobe as well. Suggest follow-up to ensure resolution and exclude presence of underlying pulmonary nodule. 3. Hiatal hernia. Electronically Signed   By: Cay Schillings  Wile M.D.   On: 01/08/2020 10:45        Scheduled Meds:  albuterol  2 puff Inhalation Q6H   vitamin C  500 mg Oral Daily   aspirin EC  81 mg Oral Daily   dexamethasone (DECADRON) injection  6 mg Intravenous Q24H   ferrous sulfate  325 mg Oral QHS   fesoterodine  4 mg Oral Daily   LORazepam  0.5 mg Oral QHS   mouth rinse  15 mL Mouth Rinse BID   multivitamin with minerals  1 tablet Oral Daily   pantoprazole  40 mg Oral BID   pravastatin  40 mg Oral QHS   sertraline  50 mg Oral Daily   sodium chloride flush  3 mL Intravenous Q12H   topiramate  50 mg Oral Daily   zinc sulfate  220 mg Oral Daily   Continuous Infusions:  sodium chloride     remdesivir 100 mg in NS 100 mL 100 mg (01/30/20 0957)     LOS: 1 day    Time spent: 38 minutes spent on chart review, discussion with nursing staff, consultants, updating family and interview/physical exam; more than 50% of that time was spent in counseling and/or coordination of care.    Kiely Cousar J [place] DO [facility] Available via Epic secure chat 7am-7pm After these hours, please refer to coverage provider listed on [email or web address] 01/30/2020, 2:08 PM

## 2020-01-30 NOTE — Progress Notes (Signed)
Pt given tylenol for temp 100.8

## 2020-01-30 NOTE — Progress Notes (Signed)
*  PRELIMINARY RESULTS* Echocardiogram 2D Echocardiogram has been performed.  [name] [name] [name] 01/30/2020, 10:14 AM

## 2020-01-30 NOTE — Plan of Care (Signed)
  Problem: Education: Goal: Knowledge of risk factors and measures for prevention of condition will improve Outcome: Progressing   

## 2020-01-30 NOTE — Progress Notes (Signed)
OT Cancellation Note  Patient Details Name: [name] MRN: [id number] DOB: 30-Sep-1940   Cancelled Treatment:    Reason Eval/Treat Not Completed: Medical issues which prohibited therapy  When OT presents to room, pt on 5Lnc with sats around 90%. Pt laying on side and low in bed. With assisting pt toward [facility] to get feet off foot board, pt's O2 sats drop into the low 80s and stay there, OT titrates pt up to 6L on the regular nasal cannula with some improvement to ~84-85%, but still low. RN notified and presents to change nasal cannula to green high flow tubing with water. Will follow up for OT evaluation next available date as appropriate. Thank you.  Gerrianne Scale, [place], OTR/L ascom [phone] 01/30/20, 4:53 PM

## 2020-01-30 DEATH — deceased

## 2020-01-31 DIAGNOSIS — U071 COVID-19: Secondary | ICD-10-CM | POA: Diagnosis not present

## 2020-01-31 DIAGNOSIS — J1282 Pneumonia due to Coronavirus disease 2019: Secondary | ICD-10-CM | POA: Diagnosis not present

## 2020-01-31 LAB — CBC WITH DIFFERENTIAL/PLATELET
Abs Immature Granulocytes: 0.01 10*3/uL (ref 0.00–0.07)
Basophils Absolute: 0 10*3/uL (ref 0.0–0.1)
Basophils Relative: 0 %
Eosinophils Absolute: 0 10*3/uL (ref 0.0–0.5)
Eosinophils Relative: 1 %
HCT: 32.5 % — ABNORMAL LOW (ref 36.0–46.0)
Hemoglobin: 10.8 g/dL — ABNORMAL LOW (ref 12.0–15.0)
Immature Granulocytes: 1 %
Lymphocytes Relative: 12 %
Lymphs Abs: 0.2 10*3/uL — ABNORMAL LOW (ref 0.7–4.0)
MCH: 27.1 pg (ref 26.0–34.0)
MCHC: 33.2 g/dL (ref 30.0–36.0)
MCV: 81.5 fL (ref 80.0–100.0)
Monocytes Absolute: 0.1 10*3/uL (ref 0.1–1.0)
Monocytes Relative: 6 %
Neutro Abs: 1.5 10*3/uL — ABNORMAL LOW (ref 1.7–7.7)
Neutrophils Relative %: 80 %
Platelets: 55 10*3/uL — ABNORMAL LOW (ref 150–400)
RBC: 3.99 MIL/uL (ref 3.87–5.11)
RDW: 17.7 % — ABNORMAL HIGH (ref 11.5–15.5)
WBC: 1.8 10*3/uL — ABNORMAL LOW (ref 4.0–10.5)
nRBC: 0 % (ref 0.0–0.2)

## 2020-01-31 LAB — COMPREHENSIVE METABOLIC PANEL
ALT: 59 U/L — ABNORMAL HIGH (ref 0–44)
AST: 83 U/L — ABNORMAL HIGH (ref 15–41)
Albumin: 2.9 g/dL — ABNORMAL LOW (ref 3.5–5.0)
Alkaline Phosphatase: 319 U/L — ABNORMAL HIGH (ref 38–126)
Anion gap: 9 (ref 5–15)
BUN: 20 mg/dL (ref 8–23)
CO2: 19 mmol/L — ABNORMAL LOW (ref 22–32)
Calcium: 8.7 mg/dL — ABNORMAL LOW (ref 8.9–10.3)
Chloride: 106 mmol/L (ref 98–111)
Creatinine, Ser: 0.73 mg/dL (ref 0.44–1.00)
GFR calc Af Amer: >60 mL/min (ref >60)
GFR calc non Af Amer: >60 mL/min (ref >60)
Glucose, Bld: 123 mg/dL — ABNORMAL HIGH (ref 70–99)
Potassium: 3.9 mmol/L (ref 3.5–5.1)
Sodium: 134 mmol/L — ABNORMAL LOW (ref 135–145)
Total Bilirubin: 0.5 mg/dL (ref 0.3–1.2)
Total Protein: 5.5 g/dL — ABNORMAL LOW (ref 6.5–8.1)

## 2020-01-31 LAB — C-REACTIVE PROTEIN: CRP: 2.8 mg/dL — ABNORMAL HIGH (ref <1.0)

## 2020-01-31 LAB — FIBRIN DERIVATIVES D-DIMER (ARMC ONLY): Fibrin derivatives D-dimer (ARMC): 1209.5 ng/mL (FEU) — ABNORMAL HIGH (ref 0.00–499.00)

## 2020-01-31 LAB — FERRITIN: Ferritin: 111 ng/mL (ref 11–307)

## 2020-01-31 LAB — MAGNESIUM: Magnesium: 2.2 mg/dL (ref 1.7–2.4)

## 2020-01-31 LAB — PHOSPHORUS: Phosphorus: 4.1 mg/dL (ref 2.5–4.6)

## 2020-01-31 MED ORDER — METHYLPREDNISOLONE SODIUM SUCC 125 MG IJ SOLR
80.0000 mg | Freq: Two times a day (BID) | INTRAMUSCULAR | Status: DC
  Administered 2020-01-31 – 2020-02-04 (×10): 80 mg via INTRAVENOUS
  Filled 2020-01-31 (×10): qty 2

## 2020-01-31 MED ORDER — BARICITINIB 2 MG PO TABS
4.0000 mg | ORAL_TABLET | Freq: Every day | ORAL | Status: DC
  Administered 2020-01-31 – 2020-02-03 (×4): 4 mg via ORAL
  Filled 2020-01-31 (×4): qty 2

## 2020-01-31 NOTE — Progress Notes (Signed)
With patient ambulating to and from [facility]. Sats were staggering between upper 70s-80s while on HFNC/NRB. Decided to change pulse ox and monitor patient while she was relaxed in bed. Once replaced, patient O2 levels were registering upper 90s. Removed NRB and sat it at bedside. And slowly decreased HFNC to 13L. She is resting in bed with call bell in reach. Left NRB in reach for rescue in case of shortness of breath. Bed in low position.

## 2020-01-31 NOTE — Evaluation (Signed)
Occupational Therapy Evaluation Patient Details Name: [name] MRN: [id number] DOB: Dec 06, 1940 Today's Date: 01/31/2020    History of Present Illness [name] is a 79 y.o. 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  Patient was diagnosed with COVID-19 viral infection about 8 days prior to her hospitalization.  Her positive test was on 01/21/20.  She has had poor oral intake, nausea, diarrhea and shortness of breath and has actually felt like she was going to pass out over the last couple of days.  While in the ER patient did have a witnessed syncopal episode which was transient.   Clinical Impression   [name] was seen for OT evaluation this date. Prior to hospital admission, pt was Independent in ADLs and mobility including driving. Pt presents to acute OT demonstrating impaired ADL performance and functional mobility 2/2 decreased activity tolerance and functional strength/balance deficits. Pt currently requires SUPERVISION tooth brushing seated EOB - VCs for rest breaks using NRB. NRB removed for tooth brushing: desat low 80s, RR low 30s - resolved c NRB reapplied. Removed second time for completing grooming, desat high 80s, RR 20s. End of session: SpO2 96% on 15L HFNC & NRB, RR 14. SUPERVISION for LB access sitting EOB. Pt would benefit from skilled OT to address noted impairments and functional limitations (see below for any additional details) in order to maximize safety and independence while minimizing falls risk and caregiver burden. Upon hospital discharge, recommend HHOT to maximize pt safety and return to functional independence during meaningful occupations of daily life..     Follow Up Recommendations  Home health OT;Supervision - Intermittent    Equipment Recommendations  3 in 1 bedside commode    Recommendations for Other  Services       Precautions / Restrictions Precautions Precautions: Fall Restrictions Weight Bearing Restrictions: No      Mobility Bed Mobility Overal bed mobility: Needs Assistance Bed Mobility: Supine to Sit;Sit to Supine     Supine to sit: Supervision Sit to supine: Supervision      Transfers Overall transfer level: Needs assistance   Transfers: Lateral/Scoot Transfers   Lateral/Scoot Transfers: Supervision      Balance Overall balance assessment: Needs assistance Sitting-balance support: Feet supported Sitting balance-Leahy Scale: Good        ADL either performed or assessed with clinical judgement   ADL Overall ADL's : Needs assistance/impaired         General ADL Comments: SUPERVISION tooth brushing seated EOB - VCs for rest breaks using NRB. Anticipate SUPERVISION for LB access sitting EOB.                   Pertinent Vitals/Pain Pain Assessment: No/denies pain     Hand Dominance Right   Extremity/Trunk Assessment Upper Extremity Assessment Upper Extremity Assessment: Generalized weakness   Lower Extremity Assessment Lower Extremity Assessment: Generalized weakness       Communication Communication Communication: No difficulties   Cognition Arousal/Alertness: Awake/alert Behavior During Therapy: WFL for tasks assessed/performed Overall Cognitive Status: Within Functional Limits for tasks assessed              General Comments  15L HFNC and NRB at rest - VSS. NRB removed for tooth brushing: desat low 80s, RR low 30s - resolved c NRB reapplied. removed second time for grooming, desat hig 80s, RR 20s. End of session: SpO2 96% on 15L HFNC &  NRB, RR 14    Exercises Exercises: Other exercises Other Exercises Other Exercises: Pt educated re: OT role, d/c recs, ECS, falls prevention Other Exercises: Tooth brushing, face washing, sup<>sit, sitting balance/tolerance, lateral scoot   Shoulder Instructions      Home Living  Family/patient expects to be discharged to:: Private residence Living Arrangements: Alone        Additional Comments: Pt coughing c prolonged conversation      Prior Functioning/Environment Level of Independence: Independent        Comments: Per PT note: patient reports she did not use AD, driving prior to arrival        OT Problem List: Decreased strength;Decreased activity tolerance;Cardiopulmonary status limiting activity      OT Treatment/Interventions: Self-care/ADL training;Therapeutic exercise;Energy conservation;DME and/or AE instruction;Therapeutic activities;Patient/family education;Balance training    OT Goals(Current goals can be found in the care plan section) Acute Rehab OT Goals Patient Stated Goal: to feel better OT Goal Formulation: With patient Time For Goal Achievement: 02/14/20 Potential to Achieve Goals: Good ADL Goals Pt Will Perform Grooming: with supervision;standing (c LRAD PRN) Pt Will Perform Lower Body Dressing: with supervision;sit to/from stand (c LRAD PRN) Pt Will Transfer to Toilet: with supervision;ambulating;bedside commode (c LRAD PRN) Additional ADL Goal #1: Pt will Independently verbalize plan to implement x3 ECS  OT Frequency: Min 1X/week   Barriers to D/C: Decreased caregiver support             AM-PAC OT "6 Clicks" Daily Activity     Outcome Measure Help from another person eating meals?: None Help from another person taking care of personal grooming?: A Little Help from another person toileting, which includes using toliet, bedpan, or urinal?: A Little Help from another person bathing (including washing, rinsing, drying)?: A Little Help from another person to put on and taking off regular upper body clothing?: None Help from another person to put on and taking off regular lower body clothing?: A Little 6 Click Score: 20   End of Session Equipment Utilized During Treatment: Oxygen  Activity Tolerance: Patient tolerated  treatment well Patient left: in bed;with call bell/phone within reach;with bed alarm set  OT Visit Diagnosis: Other abnormalities of gait and mobility (R26.89)                Time: 0938-1829 OT Time Calculation (min): 18 min Charges:  OT General Charges $OT Visit: 1 Visit OT Evaluation $OT Eval Low Complexity: 1 Low OT Treatments $Self Care/Home Management : 8-22 mins  Dessie Coma, M.S. OTR/L  01/31/20, 4:06 PM  ascom [phone]

## 2020-01-31 NOTE — Progress Notes (Signed)
   01/31/20 0815  Assess: MEWS Score  Temp 98.4 F (36.9 C)  BP 129/60  Pulse Rate 66  Resp (!) 36  Level of Consciousness Alert  SpO2 (!) 83 %  O2 Device HFNC  O2 Flow Rate (L/min) 15 L/min  Assess: MEWS Score  MEWS Temp 0  MEWS Systolic 0  MEWS Pulse 0  MEWS RR 3  MEWS LOC 0  MEWS Score 3  MEWS Score Color Yellow  Treat  Pain Scale 0-10  Pain Score 0  Take Vital Signs  Increase Vital Sign Frequency  Yellow: Q 2hr X 2 then Q 4hr X 2, if remains yellow, continue Q 4hrs  Escalate  MEWS: Escalate Yellow: discuss with charge nurse/RN and consider discussing with provider and RRT  Notify: Charge Nurse/RN  Name of Charge Nurse/RN Notified Robyn RN  Date Charge Nurse/RN Notified 01/31/20  Time Charge Nurse/RN Notified 0900  Document  Patient Outcome Stabilized after interventions   Patient was attempting to get out of bed and ambulate to [facility], when respirations were documented. Went into room and assessed patient she is resting in bed with respirations registering 22-28. Bed in low position and call bell in reach.

## 2020-01-31 NOTE — Progress Notes (Signed)
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  Patient was initially diagnosed with Covid-19 on 01/21/2020.  Patient was to receive the monoclonal antibody for Covid-19 when she was noted to have hypoxia with SPO2 84% on room air, and was directed to the emergency department for further evaluation and treatment.  In the ED, temperature 98.1, BP 114/52, HR 84, RR 18, SPO2 72 % on room air.  2, potassium 3.8, chloride 100, CO2 22, glucose 130, BUN 18, creatinine 0.97.  The BC count 33, hemoglobin 12.2, platelets 73.  Procalcitonin 0.22, lactic acid 1.5, troponin 28.  X-ray with findings of multifocal pneumonia and ovoid density left upper lobe.  NSR, rate 67, no concerning ST elevation/depressions or T wave inversions.  Given her hypoxia, patient was started on Covid-19 treatment protocol.  Hospitalist service requested for admission.   Assessment & Plan:   Principal Problem:   Pneumonia due to COVID-19 virus Active Problems:   Syncope and collapse   CLL (chronic lymphocytic leukemia) (HCC)   Acute respiratory failure due to COVID-19 (HCC)   Thrombocytopenia (HCC)   Acute hypoxic respiratory failure secondary to acute Covid-19 viral pneumonia during the ongoing 2020/2021 Covid 19 Pandemic - POA Patient presenting to ED with progressive shortness of breath.  Was to receive monoclonal antibody infusion for known diagnosis of Covid-19 on 01/21/2020.  During that evaluation, patient was noted to be hypoxic on room air; was subsequently directed to the ED for further evaluation.  Chest x-ray notable for multifocal pneumonia.  Elevated D-dimer and CRP. --COVID test: + 01/21/2020 --CRP 3.8> labs pending  this morning --ddimer [id number] --Remdesivir, plan 5-day course (Day #3/5) --Change Decadron to Solu-Medrol 80 mg IV q12h --Start baricitinib 4 mg p.o. daily for increased FiO2 requirement --prone for 2-3hrs every 12hrs if able --Continue supplemental oxygen, titrate to maintain SPO2 greater than 92%, on 13 L nasal cannula this morning --Continue supportive care with albuterol MDI prn, vitamin C, zinc, Tylenol, antitussives (benzonatate/ Mucinex/Tussionex) --Follow CBC, CMP, D-dimer, ferritin, and CRP daily --Continue airborne/contact isolation precautions for 3 weeks from the day of diagnosis  The treatment plan and use of medications and known side effects were discussed with patient/family. Some of the medications used are based on case reports/anecdotal data.  All other medications being used in the management of COVID-19 based on limited study data.  Complete risks and long-term side effects are unknown, however in the best clinical judgment they seem to be of some benefit.  Patient wanted to proceed with treatment options provided.  Left upper lobe density Chest x-ray on admission notable for ovoid density left upper lobe. --Recommend follow-up chest x-ray versus CT chest outpatient for further evaluation/interval resolution for concern of possible nodule.  Syncopal episode Patient was witnessed to have a syncopal episode while in the ED, likely related to severe hypoxia.  Echocardiogram with LVEF 55-60%, no regional wall motion normalities LV, mild LVH, grade 1 diastolic dysfunction, mild MR. --Continue monitor on telemetry --PT recommends home health on discharge --OT evaluation pending --Continue therapy efforts while inpatient  Lymphoma/leukemia Follows with medical oncology at [facility].  On chemotherapy outpatient which is currently on hold since diagnosis of Covid-19. --Continue follow-up with medical oncology on  discharge.  Depression/anxiety --Sertraline 50 mg p.o. daily --Lorazepam 0.5 mg qHS  Iron deficiency anemia:  Hemoglobin 10.8, stable. --Continue ferrous sulfate 325 mg p.o. daily  Hyperlipidemia: Continue pravastatin 40 mg p.o. daily  GERD: Continue PPI   DVT prophylaxis: SCDs Code Status: Full code Family Communication: Updated patients son, [name] via telephone this afternoon.  Disposition Plan:  Status is: Inpatient  Remains inpatient appropriate because:Ongoing diagnostic testing needed not appropriate for outpatient work up, Unsafe d/c plan, IV treatments appropriate due to intensity of illness or inability to take PO and Inpatient level of care appropriate due to severity of illness   Dispo: The patient is from: Home              Anticipated d/c is to: Home              Anticipated d/c date is: 3 days              Patient currently is not medically stable to d/c.   Consultants:   None  Procedures:   Transthoracic echocardiogram 01/30/2020: IMPRESSIONS  1. Left ventricular ejection fraction, by estimation, is 55 to 60%. The  left ventricle has normal function. The left ventricle has no regional  wall motion abnormalities. There is mild left ventricular hypertrophy.  Left ventricular diastolic parameters  are consistent with Grade I diastolic dysfunction (impaired relaxation).  2. Right ventricular systolic function is normal. The right ventricular  size is normal.  3. Mild mitral valve regurgitation.    Antimicrobials:   None   Subjective: Patient seen and examined bedside, in mild respiratory distress. Patient's oxygen requirements have increased overnight from 6 L nasal cannula up to 15 L nasal cannula. Patient also continues with severe nonproductive cough. Discussed initiation of baricitinib and increase steroids which she agrees with. Updated patient son [name] via telephone this afternoon. Patient without any other complaints or concerns at this time.   Denies headache, no dizziness, no chest pain, palpitations, no abdominal pain, no fever/chills/night sweats, no nausea/vomiting/diarrhea.  Objective: Vitals:   01/31/20 0407 01/31/20 0815 01/31/20 1009 01/31/20 1158  BP: (!) 113/56 129/60 (!) 117/48 (!) 128/55  Pulse: 68 66 77 86  Resp: (!) 24 (!) 36 18 (!) 28  Temp: (!) 97.5 F (36.4 C) 98.4 F (36.9 C) 97.7 F (36.5 C) 97.7 F (36.5 C)  TempSrc: Oral Oral Oral Oral  SpO2: 93% (!) 83% 90% (!) 84%  Weight:      Height:        Intake/Output Summary (Last 24 hours) at 01/31/2020 1225 Last data filed at 01/31/2020 0300 Gross per 24 hour  Intake 100 ml  Output --  Net 100 ml   Filed Weights   01/10/2020 1002  Weight: 77.1 kg    Examination:  General exam: Mild respiratory distress Respiratory system: Clear to auscultation. Slightly increased respiratory effort without accessory muscle use on 15 L nasal cannula with SPO2 91%. Cardiovascular system: S1 & S2 heard, RRR. No JVD, murmurs, rubs, gallops or clicks. No pedal edema. Gastrointestinal system: Abdomen is nondistended, soft and nontender. No organomegaly or masses felt. Normal bowel sounds heard. Central nervous system: Alert and oriented. No focal neurological deficits. Extremities: Symmetric 5 x 5 power. Skin: No rashes, lesions or ulcers Psychiatry: Judgement and insight appear normal. Mood & affect appropriate.     Data Reviewed: I have personally reviewed following labs and imaging studies  CBC: Recent Labs  Lab 01/14/2020 1023 01/30/20 0412 01/31/20 0714  WBC 3.3* 1.5* 1.8*  NEUTROABS 2.5 1.1* 1.5*  HGB 12.2 10.4* 10.8*  HCT 35.6* 31.9* 32.5*  MCV 79.5* 82.4 81.5  PLT 73* 61* 55*   Basic Metabolic Panel: Recent Labs  Lab 01/17/2020 1023 01/30/20 0412 01/31/20 0714  NA 132* 135 134*  K 3.8 3.9 3.9  CL 100 104 106  CO2 22 21* 19*  GLUCOSE 130* 120* 123*  BUN 18 18 20   CREATININE 0.97 0.76 0.73  CALCIUM 8.6* 8.2* 8.7*  MG  --  1.9 2.2  PHOS  --   3.9 4.1   GFR: Estimated Creatinine Clearance: 59 mL/min (by C-G formula based on SCr of 0.73 mg/dL). Liver Function Tests: Recent Labs  Lab 01/30/20 0412 01/31/20 0714  AST 94* 83*  ALT 64* 59*  ALKPHOS 314* 319*  BILITOT 0.7 0.5  PROT 5.6* 5.5*  ALBUMIN 3.0* 2.9*   No results for input(s): LIPASE, AMYLASE in the last 168 hours. No results for input(s): AMMONIA in the last 168 hours. Coagulation Profile: Recent Labs  Lab 12/31/2019 1023  INR 1.0   Cardiac Enzymes: No results for input(s): CKTOTAL, CKMB, CKMBINDEX, TROPONINI in the last 168 hours. BNP (last 3 results) No results for input(s): PROBNP in the last 8760 hours. HbA1C: No results for input(s): HGBA1C in the last 72 hours. CBG: Recent Labs  Lab 01/02/2020 1000  GLUCAP 135*   Lipid Profile: No results for input(s): CHOL, HDL, LDLCALC, TRIG, CHOLHDL, LDLDIRECT in the last 72 hours. Thyroid Function Tests: No results for input(s): TSH, T4TOTAL, FREET4, T3FREE, THYROIDAB in the last 72 hours. Anemia Panel: Recent Labs    01/30/20 0412 01/31/20 0714  FERRITIN 79 111   Sepsis Labs: Recent Labs  Lab 01/22/2020 1023 01/16/2020 1145  PROCALCITON 0.22  --   LATICACIDVEN 1.5 1.3    Recent Results (from the past 240 hour(s))  Blood culture (routine single)     Status: None (Preliminary result)   Collection Time: 01/01/2020 10:23 AM   Specimen: BLOOD LEFT ARM  Result Value Ref Range Status   Specimen Description BLOOD LEFT ARM  Final   Special Requests   Final    BOTTLES DRAWN AEROBIC AND ANAEROBIC Blood Culture adequate volume   Culture   Final    NO GROWTH 2 DAYS Performed at [facility], [address] [place], [place] [address]    Report Status PENDING  Incomplete  Blood culture (single)     Status: None (Preliminary result)   Collection Time: 01/10/2020 11:45 AM   Specimen: BLOOD  Result Value Ref Range Status   Specimen Description BLOOD LEFT ANTECUBITAL  Final   Special Requests   Final     BOTTLES DRAWN AEROBIC AND ANAEROBIC Blood Culture adequate volume   Culture   Final    NO GROWTH 2 DAYS Performed at [facility], [address], [place], [place] [address]    Report Status PENDING  Incomplete         Radiology Studies: ECHOCARDIOGRAM COMPLETE  Result Date: 01/30/2020    ECHOCARDIOGRAM REPORT   Patient Name:   ANTORIA LANZA Date of Exam: 01/30/2020 Medical Rec #:  [id number]        Height:       67.0 in Accession #:    [id number]       Weight:       170.0 lb Date of Birth:  May 11, 1940        BSA:          1.887 m Patient  Age:    19 years         BP:           112/50 mmHg Patient Gender: F                HR:           73 bpm. Exam Location:  [facility] Procedure: 2D Echo, Color Doppler and Cardiac Doppler Indications:     R55 Syncope  History:         Patient has no prior history of Echocardiogram examinations.                  Risk Factors:Dyslipidemia. Pt tested positive for COVID-19 on                  01/22/20.  Sonographer:     Charmayne Sheer RDCS (AE) Referring Phys:  [id number] Collier Bullock Diagnosing Phys: Ida Rogue MD  Sonographer Comments: No subcostal window. IMPRESSIONS  1. Left ventricular ejection fraction, by estimation, is 55 to 60%. The left ventricle has normal function. The left ventricle has no regional wall motion abnormalities. There is mild left ventricular hypertrophy. Left ventricular diastolic parameters are consistent with Grade I diastolic dysfunction (impaired relaxation).  2. Right ventricular systolic function is normal. The right ventricular size is normal.  3. Mild mitral valve regurgitation. FINDINGS  Left Ventricle: Left ventricular ejection fraction, by estimation, is 55 to 60%. The left ventricle has normal function. The left ventricle has no regional wall motion abnormalities. The left ventricular internal cavity size was normal in size. There is  mild left ventricular hypertrophy. Left ventricular diastolic parameters are consistent with  Grade I diastolic dysfunction (impaired relaxation). Right Ventricle: The right ventricular size is normal. No increase in right ventricular wall thickness. Right ventricular systolic function is normal. Left Atrium: Left atrial size was normal in size. Right Atrium: Right atrial size was normal in size. Pericardium: There is no evidence of pericardial effusion. Mitral Valve: The mitral valve is normal in structure. Mild mitral valve regurgitation. No evidence of mitral valve stenosis. MV peak gradient, 3.3 mmHg. The mean mitral valve gradient is 2.0 mmHg. Tricuspid Valve: The tricuspid valve is normal in structure. Tricuspid valve regurgitation is mild . No evidence of tricuspid stenosis. Aortic Valve: The aortic valve is normal in structure. Aortic valve regurgitation is not visualized. Mild aortic stenosis is present. Aortic valve mean gradient measures 12.0 mmHg. Aortic valve peak gradient measures 23.0 mmHg. Aortic valve area, by VTI measures 1.16 cm. Pulmonic Valve: The pulmonic valve was normal in structure. Pulmonic valve regurgitation is not visualized. No evidence of pulmonic stenosis. Aorta: The aortic root is normal in size and structure. Venous: The inferior vena cava is normal in size with greater than 50% respiratory variability, suggesting right atrial pressure of 3 mmHg. IAS/Shunts: No atrial level shunt detected by color flow Doppler.  LEFT VENTRICLE PLAX 2D LVIDd:         4.17 cm  Diastology LVIDs:         2.45 cm  LV e' medial:    6.09 cm/s LV PW:         0.95 cm  LV E/e' medial:  12.6 LV IVS:        0.86 cm  LV e' lateral:   5.87 cm/s LVOT diam:     1.90 cm  LV E/e' lateral: 13.1 LV SV:         52 LV SV Index:   27  LVOT Area:     2.84 cm  RIGHT VENTRICLE RV Basal diam:  2.93 cm LEFT ATRIUM           Index       RIGHT ATRIUM          Index LA diam:      2.10 cm 1.11 cm/m  RA Area:     9.82 cm LA Vol (A2C): 36.1 ml 19.13 ml/m RA Volume:   18.00 ml 9.54 ml/m LA Vol (A4C): 65.8 ml 34.86 ml/m   AORTIC VALVE                    PULMONIC VALVE AV Area (Vmax):    1.11 cm     PV Vmax:       1.26 m/s AV Area (Vmean):   1.19 cm     PV Vmean:      83.100 cm/s AV Area (VTI):     1.16 cm     PV VTI:        0.228 m AV Vmax:           240.00 cm/s  PV Peak grad:  6.4 mmHg AV Vmean:          158.000 cm/s PV Mean grad:  3.0 mmHg AV VTI:            0.446 m AV Peak Grad:      23.0 mmHg AV Mean Grad:      12.0 mmHg LVOT Vmax:         94.00 cm/s LVOT Vmean:        66.200 cm/s LVOT VTI:          0.182 m LVOT/AV VTI ratio: 0.41  AORTA Ao Root diam: 2.80 cm MITRAL VALVE               TRICUSPID VALVE MV Area (PHT): 3.40 cm    TR Peak grad:   27.7 mmHg MV Peak grad:  3.3 mmHg    TR Vmax:        263.00 cm/s MV Mean grad:  2.0 mmHg MV Vmax:       0.90 m/s    SHUNTS MV Vmean:      60.7 cm/s   Systemic VTI:  0.18 m MV Decel Time: 223 msec    Systemic Diam: 1.90 cm MV E velocity: 76.70 cm/s MV A velocity: 91.70 cm/s MV E/A ratio:  0.84 Ida Rogue MD Electronically signed by Ida Rogue MD Signature Date/Time: 01/30/2020/2:23:04 PM    Final         Scheduled Meds: . albuterol  2 puff Inhalation Q6H  . vitamin C  500 mg Oral Daily  . aspirin EC  81 mg Oral Daily  . baricitinib  4 mg Oral Daily  . ferrous sulfate  325 mg Oral QHS  . fesoterodine  4 mg Oral Daily  . LORazepam  0.5 mg Oral QHS  . mouth rinse  15 mL Mouth Rinse BID  . methylPREDNISolone (SOLU-MEDROL) injection  80 mg Intravenous Q12H  . multivitamin with minerals  1 tablet Oral Daily  . pantoprazole  40 mg Oral BID  . pravastatin  40 mg Oral QHS  . sertraline  50 mg Oral Daily  . sodium chloride flush  3 mL Intravenous Q12H  . topiramate  50 mg Oral Daily  . zinc sulfate  220 mg Oral Daily   Continuous Infusions: . sodium chloride    . remdesivir 100 mg in  NS 100 mL 100 mg (01/31/20 0957)     LOS: 2 days    Time spent: 40 minutes spent on chart review, discussion with nursing staff, consultants, updating family and  interview/physical exam; more than 50% of that time was spent in counseling and/or coordination of care.    Deby Adger J [place] DO [facility] Available via Epic secure chat 7am-7pm After these hours, please refer to coverage provider listed on [email or web address] 01/31/2020, 12:25 PM

## 2020-01-31 NOTE — Progress Notes (Signed)
On rounds this morning, patient was resting in bed. Nasal cannula was in place and oxygen sats were in the mid 80s. Dr. [place] previously had mentioned that patient sats looked well therefore he weaned her down to 13L HFNC. However when she sat up and took some deep breaths she began to desat into the 70s-80s. Therefore raised her back up to 15L HFNC. She is currently saturating in the upper 80s - low 90s. She doesn't complain of any pain. She does have a dry cough will medicate with cough syrup. Bed in low position and call bell in reach.

## 2020-01-31 NOTE — Progress Notes (Signed)
Monitoring patient sats on X2 patient O2 level has been consistent in the low 80s. Applied 15L NRB and HFNC 15L. Stayed with patient about 15 minutes and noticed her O2 levels came up to mid 90s. She is resting in bed. Will attempt to wean off NRB as much as possible. No complaints. Bed in low position and call bell in reach.

## 2020-02-01 DIAGNOSIS — J1282 Pneumonia due to Coronavirus disease 2019: Secondary | ICD-10-CM | POA: Diagnosis not present

## 2020-02-01 DIAGNOSIS — U071 COVID-19: Secondary | ICD-10-CM | POA: Diagnosis not present

## 2020-02-01 LAB — CBC WITH DIFFERENTIAL/PLATELET
Abs Immature Granulocytes: 0.02 10*3/uL (ref 0.00–0.07)
Basophils Absolute: 0 10*3/uL (ref 0.0–0.1)
Basophils Relative: 0 %
Eosinophils Absolute: 0 10*3/uL (ref 0.0–0.5)
Eosinophils Relative: 0 %
HCT: 32.6 % — ABNORMAL LOW (ref 36.0–46.0)
Hemoglobin: 11.1 g/dL — ABNORMAL LOW (ref 12.0–15.0)
Immature Granulocytes: 1 %
Lymphocytes Relative: 9 %
Lymphs Abs: 0.2 10*3/uL — ABNORMAL LOW (ref 0.7–4.0)
MCH: 26.6 pg (ref 26.0–34.0)
MCHC: 34 g/dL (ref 30.0–36.0)
MCV: 78.2 fL — ABNORMAL LOW (ref 80.0–100.0)
Monocytes Absolute: 0.1 10*3/uL (ref 0.1–1.0)
Monocytes Relative: 5 %
Neutro Abs: 1.8 10*3/uL (ref 1.7–7.7)
Neutrophils Relative %: 85 %
Platelets: 70 10*3/uL — ABNORMAL LOW (ref 150–400)
RBC: 4.17 MIL/uL (ref 3.87–5.11)
RDW: 17.7 % — ABNORMAL HIGH (ref 11.5–15.5)
Smear Review: DECREASED
WBC: 2.1 10*3/uL — ABNORMAL LOW (ref 4.0–10.5)
nRBC: 0 % (ref 0.0–0.2)

## 2020-02-01 LAB — FERRITIN: Ferritin: 131 ng/mL (ref 11–307)

## 2020-02-01 LAB — FIBRIN DERIVATIVES D-DIMER (ARMC ONLY): Fibrin derivatives D-dimer (ARMC): 1332.39 ng/mL (FEU) — ABNORMAL HIGH (ref 0.00–499.00)

## 2020-02-01 LAB — COMPREHENSIVE METABOLIC PANEL
ALT: 54 U/L — ABNORMAL HIGH (ref 0–44)
AST: 70 U/L — ABNORMAL HIGH (ref 15–41)
Albumin: 2.8 g/dL — ABNORMAL LOW (ref 3.5–5.0)
Alkaline Phosphatase: 308 U/L — ABNORMAL HIGH (ref 38–126)
Anion gap: 9 (ref 5–15)
BUN: 21 mg/dL (ref 8–23)
CO2: 20 mmol/L — ABNORMAL LOW (ref 22–32)
Calcium: 8.6 mg/dL — ABNORMAL LOW (ref 8.9–10.3)
Chloride: 105 mmol/L (ref 98–111)
Creatinine, Ser: 0.76 mg/dL (ref 0.44–1.00)
GFR calc Af Amer: >60 mL/min (ref >60)
GFR calc non Af Amer: >60 mL/min (ref >60)
Glucose, Bld: 161 mg/dL — ABNORMAL HIGH (ref 70–99)
Potassium: 3.9 mmol/L (ref 3.5–5.1)
Sodium: 134 mmol/L — ABNORMAL LOW (ref 135–145)
Total Bilirubin: 0.6 mg/dL (ref 0.3–1.2)
Total Protein: 5.6 g/dL — ABNORMAL LOW (ref 6.5–8.1)

## 2020-02-01 LAB — C-REACTIVE PROTEIN: CRP: 1.7 mg/dL — ABNORMAL HIGH (ref <1.0)

## 2020-02-01 LAB — PHOSPHORUS: Phosphorus: 3.5 mg/dL (ref 2.5–4.6)

## 2020-02-01 LAB — MAGNESIUM: Magnesium: 1.9 mg/dL (ref 1.7–2.4)

## 2020-02-01 NOTE — Progress Notes (Signed)
Pt oxygen was increased to 15l Hi flow and non-rebreather face mask due to desatuation. Pt oxygen levels remain approximately 91-93%. Pt was noted with a productive cough and PRN cough medication was effective as pt verbalized improvement in cough frequency.

## 2020-02-01 NOTE — Progress Notes (Signed)
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  Patient was initially diagnosed with Covid-19 on 01/21/2020.  Patient was to receive the monoclonal antibody for Covid-19 when she was noted to have hypoxia with SPO2 84% on room air, and was directed to the emergency department for further evaluation and treatment.  In the ED, temperature 98.1, BP 114/52, HR 84, RR 18, SPO2 72 % on room air.  2, potassium 3.8, chloride 100, CO2 22, glucose 130, BUN 18, creatinine 0.97.  The BC count 33, hemoglobin 12.2, platelets 73.  Procalcitonin 0.22, lactic acid 1.5, troponin 28.  X-Julie with findings of multifocal pneumonia and ovoid density left upper lobe.  NSR, rate 67, no concerning ST elevation/depressions or T wave inversions.  Given her hypoxia, patient was started on Covid-19 treatment protocol.  Hospitalist service requested for admission.   Assessment & Plan:   Principal Problem:   Pneumonia due to COVID-19 virus Active Problems:   Syncope and collapse   CLL (chronic lymphocytic leukemia) (HCC)   Acute respiratory failure due to COVID-19 (HCC)   Thrombocytopenia (HCC)   Acute hypoxic respiratory failure secondary to acute Covid-19 viral pneumonia during the ongoing 2020/2021 Covid 19 Pandemic - POA Patient presenting to ED with progressive shortness of breath.  Was to receive monoclonal antibody infusion for known diagnosis of Covid-19 on 01/21/2020.  During that evaluation, patient was noted to be hypoxic on room air; was subsequently directed to the ED for further evaluation.  Chest x-Julie notable for multifocal pneumonia.  Elevated D-dimer and CRP. --COVID test: + 01/21/2020 --CRP  3.8>2.8>1.7 --ddimer [phone] --Remdesivir, plan 5-day course (Day #4/5) --Solu-Medrol 80 mg IV q12h --Start baricitinib 4 mg p.o. daily for increased FiO2 requirement --prone for 2-3hrs every 12hrs if able --Continue supplemental oxygen, titrate to maintain SPO2 greater than 92%, on 15L HFNC and 15L NRB --repeat CXR in am --Continue supportive care with albuterol MDI prn, vitamin C, zinc, Tylenol, antitussives (benzonatate/ Mucinex/Tussionex) --Follow CBC, CMP, D-dimer, ferritin, and CRP daily --Continue airborne/contact isolation precautions for 3 weeks from the day of diagnosis  The treatment plan and use of medications and known side effects were discussed with patient/family. Some of the medications used are based on case reports/anecdotal data.  All other medications being used in the management of COVID-19 based on limited study data.  Complete risks and long-term side effects are unknown, however in the best clinical judgment they seem to be of some benefit.  Patient wanted to proceed with treatment options provided.  Left upper lobe density Chest x-Julie on admission notable for ovoid density left upper lobe. --Recommend follow-up chest x-Julie versus CT chest outpatient for further evaluation/interval resolution for concern of possible nodule.  Syncopal episode Patient was witnessed to have a syncopal episode while in the ED, likely related to severe hypoxia.  Echocardiogram with LVEF 55-60%, no regional wall motion normalities LV, mild LVH, grade 1 diastolic dysfunction, mild MR. --Continue monitor on telemetry --PT recommends home health on discharge --OT evaluation pending --Continue therapy efforts while inpatient  Lymphoma/leukemia Follows with medical oncology at [facility].  On chemotherapy outpatient which is currently on hold since diagnosis of Covid-19. --Continue follow-up with medical oncology on discharge.  Depression/anxiety --Sertraline 50  mg p.o. daily --Lorazepam 0.5 mg qHS  Iron deficiency anemia:  Hemoglobin 10.8, stable. --Continue ferrous sulfate 325 mg p.o. daily  Hyperlipidemia: Continue pravastatin 40 mg p.o. daily  GERD: Continue PPI   DVT prophylaxis: SCDs Code Status: Full code Family Communication: Updated patients son, [name] via telephone this afternoon.  Disposition Plan:  Status is: Inpatient  Remains inpatient appropriate because:Ongoing diagnostic testing needed not appropriate for outpatient work up, Unsafe d/c plan, IV treatments appropriate due to intensity of illness or inability to take PO and Inpatient level of care appropriate due to severity of illness   Dispo: The patient is from: Home              Anticipated d/c is to: Home              Anticipated d/c date is: > 3 days              Patient currently is not medically stable to d/c.  Continues with high FiO2 requirements   Consultants:   None  Procedures:   Transthoracic echocardiogram 01/30/2020: IMPRESSIONS  1. Left ventricular ejection fraction, by estimation, is 55 to 60%. The  left ventricle has normal function. The left ventricle has no regional  wall motion abnormalities. There is mild left ventricular hypertrophy.  Left ventricular diastolic parameters  are consistent with Grade I diastolic dysfunction (impaired relaxation).  2. Right ventricular systolic function is normal. The right ventricular  size is normal.  3. Mild mitral valve regurgitation.    Antimicrobials:   None   Subjective: Patient seen and examined bedside, continues with severe cough, mild respiratory distress.  Continues on high flow nasal cannula 15 L with 15 L NRB, with SPO2 98%.  Updated patient's son [name] via telephone this afternoon.  Continues on remdesivir, high-dose IV steroids and baricitinib. Patient without any other complaints or concerns at this time.  Denies headache, no dizziness, no chest pain, palpitations, no abdominal pain, no  fever/chills/night sweats, no nausea/vomiting/diarrhea.  Objective: Vitals:   02/01/20 0514 02/01/20 0854 02/01/20 0915 02/01/20 1236  BP: 135/70 (!) 141/67  (!) 144/76  Pulse: 77 80  80  Resp: (!) 22 (!) 22  20  Temp: (!) 97.2 F (36.2 C) 97.7 F (36.5 C)  (!) 97.5 F (36.4 C)  TempSrc: Axillary Oral  Oral  SpO2: 91% (!) 87% 90% 98%  Weight:      Height:       No intake or output data in the 24 hours ending 02/01/20 1247 Filed Weights   01/12/2020 1002  Weight: 77.1 kg    Examination:  General exam: Mild respiratory distress Respiratory system: Clear to auscultation. Slightly increased respiratory effort without accessory muscle use on 15L nasal cannula and 15L NRM with SPO2 98%. Cardiovascular system: S1 & S2 heard, RRR. No JVD, murmurs, rubs, gallops or clicks. No pedal edema. Gastrointestinal system: Abdomen is nondistended, soft and nontender. No organomegaly or masses felt. Normal bowel sounds heard. Central nervous system: Alert and oriented. No focal neurological deficits. Extremities: Symmetric 5 x 5 power. Skin: No rashes, lesions or ulcers Psychiatry: Judgement and insight appear normal. Mood & affect appropriate.     Data Reviewed: I have personally reviewed following labs and imaging studies  CBC: Recent Labs  Lab 12/31/2019 1023 01/30/20 0412 01/31/20 0714 02/01/20 0500  WBC 3.3* 1.5* 1.8* 2.1*  NEUTROABS 2.5 1.1* 1.5* 1.8  HGB 12.2 10.4* 10.8* 11.1*  HCT 35.6* 31.9* 32.5* 32.6*  MCV 79.5* 82.4  81.5 78.2*  PLT 73* 61* 55* 70*   Basic Metabolic Panel: Recent Labs  Lab 01/18/2020 1023 01/30/20 0412 01/31/20 0714 02/01/20 0500  NA 132* 135 134* 134*  K 3.8 3.9 3.9 3.9  CL 100 104 106 105  CO2 22 21* 19* 20*  GLUCOSE 130* 120* 123* 161*  BUN 18 18 20 21   CREATININE 0.97 0.76 0.73 0.76  CALCIUM 8.6* 8.2* 8.7* 8.6*  MG  --  1.9 2.2 1.9  PHOS  --  3.9 4.1 3.5   GFR: Estimated Creatinine Clearance: 59 mL/min (by C-G formula based on SCr of 0.76  mg/dL). Liver Function Tests: Recent Labs  Lab 01/30/20 0412 01/31/20 0714 02/01/20 0500  AST 94* 83* 70*  ALT 64* 59* 54*  ALKPHOS 314* 319* 308*  BILITOT 0.7 0.5 0.6  PROT 5.6* 5.5* 5.6*  ALBUMIN 3.0* 2.9* 2.8*   No results for input(s): LIPASE, AMYLASE in the last 168 hours. No results for input(s): AMMONIA in the last 168 hours. Coagulation Profile: Recent Labs  Lab 01/25/2020 1023  INR 1.0   Cardiac Enzymes: No results for input(s): CKTOTAL, CKMB, CKMBINDEX, TROPONINI in the last 168 hours. BNP (last 3 results) No results for input(s): PROBNP in the last 8760 hours. HbA1C: No results for input(s): HGBA1C in the last 72 hours. CBG: Recent Labs  Lab 01/26/2020 1000  GLUCAP 135*   Lipid Profile: No results for input(s): CHOL, HDL, LDLCALC, TRIG, CHOLHDL, LDLDIRECT in the last 72 hours. Thyroid Function Tests: No results for input(s): TSH, T4TOTAL, FREET4, T3FREE, THYROIDAB in the last 72 hours. Anemia Panel: Recent Labs    01/31/20 0714 02/01/20 0500  FERRITIN 111 131   Sepsis Labs: Recent Labs  Lab 01/15/2020 1023 01/09/2020 1145  PROCALCITON 0.22  --   LATICACIDVEN 1.5 1.3    Recent Results (from the past 240 hour(s))  Blood culture (routine single)     Status: None (Preliminary result)   Collection Time: 01/28/2020 10:23 AM   Specimen: BLOOD LEFT ARM  Result Value Ref Range Status   Specimen Description BLOOD LEFT ARM  Final   Special Requests   Final    BOTTLES DRAWN AEROBIC AND ANAEROBIC Blood Culture adequate volume   Culture   Final    NO GROWTH 3 DAYS Performed at [facility], [address] [place], [place] [address]    Report Status PENDING  Incomplete  Blood culture (single)     Status: None (Preliminary result)   Collection Time: 01/08/2020 11:45 AM   Specimen: BLOOD  Result Value Ref Range Status   Specimen Description BLOOD LEFT ANTECUBITAL  Final   Special Requests   Final    BOTTLES DRAWN AEROBIC AND ANAEROBIC Blood Culture  adequate volume   Culture   Final    NO GROWTH 3 DAYS Performed at [facility], [address], [place], [place] [address]    Report Status PENDING  Incomplete         Radiology Studies: No results found.      Scheduled Meds: . albuterol  2 puff Inhalation Q6H  . vitamin C  500 mg Oral Daily  . aspirin EC  81 mg Oral Daily  . baricitinib  4 mg Oral Daily  . ferrous sulfate  325 mg Oral QHS  . fesoterodine  4 mg Oral Daily  . LORazepam  0.5 mg Oral QHS  . mouth rinse  15 mL Mouth Rinse BID  . methylPREDNISolone (SOLU-MEDROL) injection  80 mg Intravenous  Q12H  . multivitamin with minerals  1 tablet Oral Daily  . pantoprazole  40 mg Oral BID  . pravastatin  40 mg Oral QHS  . sertraline  50 mg Oral Daily  . sodium chloride flush  3 mL Intravenous Q12H  . topiramate  50 mg Oral Daily  . zinc sulfate  220 mg Oral Daily   Continuous Infusions: . sodium chloride    . remdesivir 100 mg in NS 100 mL 100 mg (02/01/20 0835)     LOS: 3 days    Time spent: 39 minutes spent on chart review, discussion with nursing staff, consultants, updating family and interview/physical exam; more than 50% of that time was spent in counseling and/or coordination of care.    Rylei Codispoti J [place] DO [facility] Available via Epic secure chat 7am-7pm After these hours, please refer to coverage provider listed on [email or web address] 02/01/2020, 12:47 PM

## 2020-02-02 ENCOUNTER — Inpatient Hospital Stay: Payer: Medicare HMO

## 2020-02-02 ENCOUNTER — Encounter: Payer: Self-pay | Admitting: Internal Medicine

## 2020-02-02 DIAGNOSIS — J1282 Pneumonia due to Coronavirus disease 2019: Secondary | ICD-10-CM | POA: Diagnosis not present

## 2020-02-02 DIAGNOSIS — U071 COVID-19: Secondary | ICD-10-CM | POA: Diagnosis not present

## 2020-02-02 LAB — COMPREHENSIVE METABOLIC PANEL
ALT: 49 U/L — ABNORMAL HIGH (ref 0–44)
AST: 53 U/L — ABNORMAL HIGH (ref 15–41)
Albumin: 2.8 g/dL — ABNORMAL LOW (ref 3.5–5.0)
Alkaline Phosphatase: 274 U/L — ABNORMAL HIGH (ref 38–126)
Anion gap: 10 (ref 5–15)
BUN: 29 mg/dL — ABNORMAL HIGH (ref 8–23)
CO2: 21 mmol/L — ABNORMAL LOW (ref 22–32)
Calcium: 9.2 mg/dL (ref 8.9–10.3)
Chloride: 104 mmol/L (ref 98–111)
Creatinine, Ser: 0.72 mg/dL (ref 0.44–1.00)
GFR calc Af Amer: >60 mL/min (ref >60)
GFR calc non Af Amer: >60 mL/min (ref >60)
Glucose, Bld: 143 mg/dL — ABNORMAL HIGH (ref 70–99)
Potassium: 4.4 mmol/L (ref 3.5–5.1)
Sodium: 135 mmol/L (ref 135–145)
Total Bilirubin: 0.6 mg/dL (ref 0.3–1.2)
Total Protein: 5.5 g/dL — ABNORMAL LOW (ref 6.5–8.1)

## 2020-02-02 LAB — CBC WITH DIFFERENTIAL/PLATELET
Abs Immature Granulocytes: 0.03 10*3/uL (ref 0.00–0.07)
Basophils Absolute: 0 10*3/uL (ref 0.0–0.1)
Basophils Relative: 0 %
Eosinophils Absolute: 0 10*3/uL (ref 0.0–0.5)
Eosinophils Relative: 0 %
HCT: 32.3 % — ABNORMAL LOW (ref 36.0–46.0)
Hemoglobin: 10.9 g/dL — ABNORMAL LOW (ref 12.0–15.0)
Immature Granulocytes: 1 %
Lymphocytes Relative: 3 %
Lymphs Abs: 0.2 10*3/uL — ABNORMAL LOW (ref 0.7–4.0)
MCH: 27.3 pg (ref 26.0–34.0)
MCHC: 33.7 g/dL (ref 30.0–36.0)
MCV: 80.8 fL (ref 80.0–100.0)
Monocytes Absolute: 0.2 10*3/uL (ref 0.1–1.0)
Monocytes Relative: 4 %
Neutro Abs: 4.5 10*3/uL (ref 1.7–7.7)
Neutrophils Relative %: 92 %
Platelets: 83 10*3/uL — ABNORMAL LOW (ref 150–400)
RBC: 4 MIL/uL (ref 3.87–5.11)
RDW: 17.6 % — ABNORMAL HIGH (ref 11.5–15.5)
WBC: 4.9 10*3/uL (ref 4.0–10.5)
nRBC: 0 % (ref 0.0–0.2)

## 2020-02-02 LAB — FERRITIN: Ferritin: 107 ng/mL (ref 11–307)

## 2020-02-02 LAB — FIBRIN DERIVATIVES D-DIMER (ARMC ONLY): Fibrin derivatives D-dimer (ARMC): 2060.56 ng/mL (FEU) — ABNORMAL HIGH (ref 0.00–499.00)

## 2020-02-02 LAB — C-REACTIVE PROTEIN: CRP: 0.7 mg/dL (ref <1.0)

## 2020-02-02 LAB — PHOSPHORUS: Phosphorus: 3.9 mg/dL (ref 2.5–4.6)

## 2020-02-02 LAB — MAGNESIUM: Magnesium: 2.1 mg/dL (ref 1.7–2.4)

## 2020-02-02 MED ORDER — FUROSEMIDE 10 MG/ML IJ SOLN
40.0000 mg | Freq: Two times a day (BID) | INTRAMUSCULAR | Status: DC
  Administered 2020-02-02 – 2020-02-05 (×7): 40 mg via INTRAVENOUS
  Filled 2020-02-02 (×7): qty 4

## 2020-02-02 MED ORDER — IOHEXOL 350 MG/ML SOLN
75.0000 mL | Freq: Once | INTRAVENOUS | Status: AC | PRN
  Administered 2020-02-02: 17:00:00 75 mL via INTRAVENOUS

## 2020-02-02 NOTE — Progress Notes (Signed)
PROGRESS NOTE    [name]  UKG:[id number] DOB: [birth year] DOA: 01/09/2020 PCP: [name], MD    Brief Narrative:  [name] 79 year old female with past medical history notable for lymphoma/leukemia on chemotherapy, depression, GERD, anxiety who presented to the emergency department with progressive shortness of breath.  Patient was initially diagnosed with Covid-19 on 01/21/2020.  Patient was to receive the monoclonal antibody for Covid-19 when she was noted to have hypoxia with SPO2 84% on room air, and was directed to the emergency department for further evaluation and treatment.  In the ED, temperature 98.1, BP 114/52, HR 84, RR 18, SPO2 72 % on room air.  2, potassium 3.8, chloride 100, CO2 22, glucose 130, BUN 18, creatinine 0.97.  The BC count 33, hemoglobin 12.2, platelets 73.  Procalcitonin 0.22, lactic acid 1.5, troponin 28.  X-ray with findings of multifocal pneumonia and ovoid density left upper lobe.  NSR, rate 67, no concerning ST elevation/depressions or T wave inversions.  Given her hypoxia, patient was started on Covid-19 treatment protocol.  Hospitalist service requested for admission.   Assessment & Plan:   Principal Problem:   Pneumonia due to COVID-19 virus Active Problems:   Syncope and collapse   CLL (chronic lymphocytic leukemia) (HCC)   Acute respiratory failure due to COVID-19 (HCC)   Thrombocytopenia (HCC)   Acute hypoxic respiratory failure secondary to acute Covid-19 viral pneumonia during the ongoing 2020/2021 Covid 19 Pandemic - POA Patient presenting to ED with progressive shortness of breath.  Was to receive monoclonal antibody infusion for known diagnosis of Covid-19 on 01/21/2020.  During that evaluation, patient was noted to be hypoxic on room air; was subsequently directed to the ED for further evaluation.  Chest x-ray notable for multifocal pneumonia.  Elevated D-dimer and CRP. --COVID test: + 01/21/2020 --CRP  3.8>2.8>1.7>0.7 --ddimer 1428>1209>1332>2060 --Remdesivir, plan 5-day course (Day #5/5) --Solu-Medrol 80 mg IV q12h --Baricitinib 4 mg p.o. daily (Day #3/14) --Lasix 40mg  IV q12h --prone for 2-3hrs every 12hrs if able --Continue supplemental oxygen, titrate to maintain SPO2 greater than 9%, on 15L HFNC and 15L NRB --check CT angiogram chest and bilat duplex U/S lower extremity for continued hypoxia in setting of uptrending ddimer --Continue supportive care with albuterol MDI prn, vitamin C, zinc, Tylenol, antitussives (benzonatate/ Mucinex/Tussionex) --Follow CBC, CMP, D-dimer, ferritin, and CRP daily --Continue airborne/contact isolation precautions for 3 weeks from the day of diagnosis  The treatment plan and use of medications and known side effects were discussed with patient/family. Some of the medications used are based on case reports/anecdotal data.  All other medications being used in the management of COVID-19 based on limited study data.  Complete risks and long-term side effects are unknown, however in the best clinical judgment they seem to be of some benefit.  Patient wanted to proceed with treatment options provided.  Left upper lobe density Chest x-ray on admission notable for ovoid density left upper lobe. --Recommend follow-up chest x-ray versus CT chest outpatient for further evaluation/interval resolution for concern of possible nodule.  Syncopal episode Patient was witnessed to have a syncopal episode while in the ED, likely related to severe hypoxia.  Echocardiogram with LVEF 55-60%, no regional wall motion normalities LV, mild LVH, grade 1 diastolic dysfunction, mild MR. --Continue monitor on telemetry --PT/OT recommends home health on discharge --Continue therapy efforts while inpatient  Lymphoma/leukemia Follows with medical oncology at [facility].  On chemotherapy outpatient which is currently on hold since diagnosis of Covid-19. --  Continue  follow-up with medical oncology on discharge.  Depression/anxiety --Sertraline 50 mg p.o. daily --Lorazepam 0.5 mg qHS  Iron deficiency anemia:  Hemoglobin 11.1, stable. --Continue ferrous sulfate 325 mg p.o. daily  Hyperlipidemia: Continue pravastatin 40 mg p.o. daily  GERD: Continue PPI   DVT prophylaxis: SCDs Code Status: Full code Family Communication: Updated patients son, [name] via telephone this afternoon.  Disposition Plan:  Status is: Inpatient  Remains inpatient appropriate because:Ongoing diagnostic testing needed not appropriate for outpatient work up, Unsafe d/c plan, IV treatments appropriate due to intensity of illness or inability to take PO and Inpatient level of care appropriate due to severity of illness   Dispo: The patient is from: Home              Anticipated d/c is to: Home              Anticipated d/c date is: > 3 days              Patient currently is not medically stable to d/c.  Continues with high FiO2 requirements   Consultants:   None  Procedures:   Transthoracic echocardiogram 01/30/2020: IMPRESSIONS  1. Left ventricular ejection fraction, by estimation, is 55 to 60%. The  left ventricle has normal function. The left ventricle has no regional  wall motion abnormalities. There is mild left ventricular hypertrophy.  Left ventricular diastolic parameters  are consistent with Grade I diastolic dysfunction (impaired relaxation).  2. Right ventricular systolic function is normal. The right ventricular  size is normal.  3. Mild mitral valve regurgitation.   CT angiogram chest: Pending  Ultrasound duplex bilateral lower extremities: Pending   Antimicrobials:   None   Subjective: Patient seen and examined bedside, continues with severe cough, mild respiratory distress.  Continues on high flow nasal cannula 15 L with 15 L NRB, with SPO2 96%.  Updated patient's son [name] via telephone this afternoon.  Continues on remdesivir, high-dose  IV steroids and baricitinib. Patient without any other complaints or concerns at this time.  Will obtain CTA chest and bilateral duplex ultrasound lower extremities for uptrending D-dimer. Patient denies headache, no dizziness, no chest pain, palpitations, no abdominal pain, no fever/chills/night sweats, no nausea/vomiting/diarrhea.  Objective: Vitals:   02/02/20 0731 02/02/20 1013 02/02/20 1020 02/02/20 1200  BP: 127/60  (!) 122/56 (!) 115/57  Pulse: 81  78 73  Resp: (!) 31 (!) 36 (!) 32 (!) 34  Temp: 97.8 F (36.6 C)  97.9 F (36.6 C) 97.9 F (36.6 C)  TempSrc:   Oral   SpO2: 90%  93% 90%  Weight:      Height:        Intake/Output Summary (Last 24 hours) at 02/02/2020 1211 Last data filed at 02/01/2020 2217 Gross per 24 hour  Intake 360 ml  Output --  Net 360 ml   Filed Weights   01/21/2020 1002  Weight: 77.1 kg    Examination:  General exam: Mild respiratory distress Respiratory system: Clear to auscultation. Slightly increased respiratory effort without accessory muscle use on 15L nasal cannula and 15L NRM with SPO2 96%. Cardiovascular system: S1 & S2 heard, RRR. No JVD, murmurs, rubs, gallops or clicks. No pedal edema. Gastrointestinal system: Abdomen is nondistended, soft and nontender. No organomegaly or masses felt. Normal bowel sounds heard. Central nervous system: Alert and oriented. No focal neurological deficits. Extremities: Symmetric 5 x 5 power. Skin: No rashes, lesions or ulcers Psychiatry: Judgement and insight appear normal. Mood & affect appropriate.  Data Reviewed: I have personally reviewed following labs and imaging studies  CBC: Recent Labs  Lab 01/13/2020 1023 01/30/20 0412 01/31/20 0714 02/01/20 0500 02/02/20 0538  WBC 3.3* 1.5* 1.8* 2.1* 4.9  NEUTROABS 2.5 1.1* 1.5* 1.8 4.5  HGB 12.2 10.4* 10.8* 11.1* 10.9*  HCT 35.6* 31.9* 32.5* 32.6* 32.3*  MCV 79.5* 82.4 81.5 78.2* 80.8  PLT 73* 61* 55* 70* 83*   Basic Metabolic Panel: Recent Labs   Lab 01/23/2020 1023 01/30/20 0412 01/31/20 0714 02/01/20 0500 02/02/20 0538  NA 132* 135 134* 134* 135  K 3.8 3.9 3.9 3.9 4.4  CL 100 104 106 105 104  CO2 22 21* 19* 20* 21*  GLUCOSE 130* 120* 123* 161* 143*  BUN 18 18 20 21  29*  CREATININE 0.97 0.76 0.73 0.76 0.72  CALCIUM 8.6* 8.2* 8.7* 8.6* 9.2  MG  --  1.9 2.2 1.9 2.1  PHOS  --  3.9 4.1 3.5 3.9   GFR: Estimated Creatinine Clearance: 59 mL/min (by C-G formula based on SCr of 0.72 mg/dL). Liver Function Tests: Recent Labs  Lab 01/30/20 0412 01/31/20 0714 02/01/20 0500 02/02/20 0538  AST 94* 83* 70* 53*  ALT 64* 59* 54* 49*  ALKPHOS 314* 319* 308* 274*  BILITOT 0.7 0.5 0.6 0.6  PROT 5.6* 5.5* 5.6* 5.5*  ALBUMIN 3.0* 2.9* 2.8* 2.8*   No results for input(s): LIPASE, AMYLASE in the last 168 hours. No results for input(s): AMMONIA in the last 168 hours. Coagulation Profile: Recent Labs  Lab 01/06/2020 1023  INR 1.0   Cardiac Enzymes: No results for input(s): CKTOTAL, CKMB, CKMBINDEX, TROPONINI in the last 168 hours. BNP (last 3 results) No results for input(s): PROBNP in the last 8760 hours. HbA1C: No results for input(s): HGBA1C in the last 72 hours. CBG: Recent Labs  Lab 01/15/2020 1000  GLUCAP 135*   Lipid Profile: No results for input(s): CHOL, HDL, LDLCALC, TRIG, CHOLHDL, LDLDIRECT in the last 72 hours. Thyroid Function Tests: No results for input(s): TSH, T4TOTAL, FREET4, T3FREE, THYROIDAB in the last 72 hours. Anemia Panel: Recent Labs    02/01/20 0500 02/02/20 0538  FERRITIN 131 107   Sepsis Labs: Recent Labs  Lab 01/02/2020 1023 01/26/2020 1145  PROCALCITON 0.22  --   LATICACIDVEN 1.5 1.3    Recent Results (from the past 240 hour(s))  Blood culture (routine single)     Status: None (Preliminary result)   Collection Time: 01/03/2020 10:23 AM   Specimen: BLOOD LEFT ARM  Result Value Ref Range Status   Specimen Description BLOOD LEFT ARM  Final   Special Requests   Final    BOTTLES DRAWN  AEROBIC AND ANAEROBIC Blood Culture adequate volume   Culture   Final    NO GROWTH 4 DAYS Performed at [facility], [address], [place], [place] [address]    Report Status PENDING  Incomplete  Blood culture (single)     Status: None (Preliminary result)   Collection Time: 01/23/2020 11:45 AM   Specimen: BLOOD  Result Value Ref Range Status   Specimen Description BLOOD LEFT ANTECUBITAL  Final   Special Requests   Final    BOTTLES DRAWN AEROBIC AND ANAEROBIC Blood Culture adequate volume   Culture   Final    NO GROWTH 4 DAYS Performed at [facility], [address] [place], [place] [address]    Report Status PENDING  Incomplete         Radiology Studies: DG Chest [facility] 1 [address]  Result Date: 02/02/2020 CLINICAL DATA:  Shortness of breath EXAM: PORTABLE CHEST 1 VIEW COMPARISON:  01/11/2020 FINDINGS: Lower volumes and progressively confluent airspace disease. Cardiopericardial enlargement. No visible pneumothorax. Porta catheter on the right with tip at the upper cavoatrial junction. IMPRESSION: Worsening lung volumes and diffuse airspacedisease. Electronically Signed   By: Monte Fantasia M.D.   On: 02/02/2020 05:34        Scheduled Meds: . albuterol  2 puff Inhalation Q6H  . vitamin C  500 mg Oral Daily  . aspirin EC  81 mg Oral Daily  . baricitinib  4 mg Oral Daily  . ferrous sulfate  325 mg Oral QHS  . fesoterodine  4 mg Oral Daily  . furosemide  40 mg Intravenous Q12H  . LORazepam  0.5 mg Oral QHS  . mouth rinse  15 mL Mouth Rinse BID  . methylPREDNISolone (SOLU-MEDROL) injection  80 mg Intravenous Q12H  . multivitamin with minerals  1 tablet Oral Daily  . pantoprazole  40 mg Oral BID  . pravastatin  40 mg Oral QHS  . sertraline  50 mg Oral Daily  . sodium chloride flush  3 mL Intravenous Q12H  . topiramate  50 mg Oral Daily  . zinc sulfate  220 mg Oral Daily   Continuous Infusions: . sodium chloride       LOS: 4 days    Time spent:  40 minutes spent on chart review, discussion with nursing staff, consultants, updating family and interview/physical exam; more than 50% of that time was spent in counseling and/or coordination of care.    Sherre Wooton J [place] DO [facility] Available via Epic secure chat 7am-7pm After these hours, please refer to coverage provider listed on [email or web address] 02/02/2020, 12:11 PM

## 2020-02-02 NOTE — Progress Notes (Signed)
   02/02/20 0731  Assess: MEWS Score  Temp 97.8 F (36.6 C)  BP 127/60  Pulse Rate 81  Resp (!) 31  Level of Consciousness Alert  SpO2 90 %  O2 Device HFNC;Non-rebreather Mask  O2 Flow Rate (L/min) 15 L/min  Assess: MEWS Score  MEWS Temp 0  MEWS Systolic 0  MEWS Pulse 0  MEWS RR 2  MEWS LOC 0  MEWS Score 2  MEWS Score Color Yellow  Assess: if the MEWS score is Yellow or Red  Were vital signs taken at a resting state? Yes  Focused Assessment No change from prior assessment  Early Detection of Sepsis Score *See Row Information* Low  MEWS guidelines implemented *See Row Information* Yes  Treat  MEWS Interventions Escalated (See documentation below)  Pain Scale 0-10  Pain Score 0  Take Vital Signs  Increase Vital Sign Frequency  Yellow: Q 2hr X 2 then Q 4hr X 2, if remains yellow, continue Q 4hrs  Escalate  MEWS: Escalate Yellow: discuss with charge nurse/RN and consider discussing with provider and RRT  Notify: Charge Nurse/RN  Name of Charge Nurse/RN Notified Caryl Pina RN   Date Charge Nurse/RN Notified 02/02/20  Time Charge Nurse/RN Notified 0745  Document  Patient Outcome Not stable and remains on department  Progress note created (see row info) Yes   Spoke with MD this morning and he is aware of patient's respiratory status.

## 2020-02-02 NOTE — Progress Notes (Addendum)
PT Cancellation Note  Patient Details Name: [name] MRN: [id number] DOB: 1941/02/06   Cancelled Treatment:    Reason Eval/Treat Not Completed: Medical issues which prohibited therapy   Pt in bed sleeping.  Awakes with verbal cues but reports feeling poorly.  Pt on nasal canula and non-rebreather at this time. Unable to provide education or encourage deep breathing at this time.   Also noted, awaiting [place] to r/o DVT.  Will check results before next session.  Session held at this time given c/o and presentation.  Will continue as appropraite.   [name] 02/02/2020, 3:24 PM

## 2020-02-03 DIAGNOSIS — J1282 Pneumonia due to Coronavirus disease 2019: Secondary | ICD-10-CM | POA: Diagnosis not present

## 2020-02-03 DIAGNOSIS — U071 COVID-19: Secondary | ICD-10-CM | POA: Diagnosis not present

## 2020-02-03 LAB — FERRITIN: Ferritin: 90 ng/mL (ref 11–307)

## 2020-02-03 LAB — CBC WITH DIFFERENTIAL/PLATELET
Abs Immature Granulocytes: 0.01 10*3/uL (ref 0.00–0.07)
Basophils Absolute: 0 10*3/uL (ref 0.0–0.1)
Basophils Relative: 0 %
Eosinophils Absolute: 0 10*3/uL (ref 0.0–0.5)
Eosinophils Relative: 0 %
HCT: 34 % — ABNORMAL LOW (ref 36.0–46.0)
Hemoglobin: 11.6 g/dL — ABNORMAL LOW (ref 12.0–15.0)
Immature Granulocytes: 0 %
Lymphocytes Relative: 3 %
Lymphs Abs: 0.2 10*3/uL — ABNORMAL LOW (ref 0.7–4.0)
MCH: 27.2 pg (ref 26.0–34.0)
MCHC: 34.1 g/dL (ref 30.0–36.0)
MCV: 79.8 fL — ABNORMAL LOW (ref 80.0–100.0)
Monocytes Absolute: 0.2 10*3/uL (ref 0.1–1.0)
Monocytes Relative: 3 %
Neutro Abs: 4.6 10*3/uL (ref 1.7–7.7)
Neutrophils Relative %: 94 %
Platelets: 92 10*3/uL — ABNORMAL LOW (ref 150–400)
RBC: 4.26 MIL/uL (ref 3.87–5.11)
RDW: 17.5 % — ABNORMAL HIGH (ref 11.5–15.5)
WBC: 5 10*3/uL (ref 4.0–10.5)
nRBC: 0 % (ref 0.0–0.2)

## 2020-02-03 LAB — COMPREHENSIVE METABOLIC PANEL
ALT: 45 U/L — ABNORMAL HIGH (ref 0–44)
AST: 42 U/L — ABNORMAL HIGH (ref 15–41)
Albumin: 3 g/dL — ABNORMAL LOW (ref 3.5–5.0)
Alkaline Phosphatase: 260 U/L — ABNORMAL HIGH (ref 38–126)
Anion gap: 12 (ref 5–15)
BUN: 39 mg/dL — ABNORMAL HIGH (ref 8–23)
CO2: 23 mmol/L (ref 22–32)
Calcium: 8.9 mg/dL (ref 8.9–10.3)
Chloride: 103 mmol/L (ref 98–111)
Creatinine, Ser: 0.87 mg/dL (ref 0.44–1.00)
GFR calc Af Amer: >60 mL/min (ref >60)
GFR calc non Af Amer: >60 mL/min (ref >60)
Glucose, Bld: 141 mg/dL — ABNORMAL HIGH (ref 70–99)
Potassium: 4 mmol/L (ref 3.5–5.1)
Sodium: 138 mmol/L (ref 135–145)
Total Bilirubin: 0.7 mg/dL (ref 0.3–1.2)
Total Protein: 5.7 g/dL — ABNORMAL LOW (ref 6.5–8.1)

## 2020-02-03 LAB — CULTURE, BLOOD (SINGLE)
Culture: NO GROWTH
Culture: NO GROWTH
Special Requests: ADEQUATE
Special Requests: ADEQUATE

## 2020-02-03 LAB — PHOSPHORUS: Phosphorus: 4.2 mg/dL (ref 2.5–4.6)

## 2020-02-03 LAB — C-REACTIVE PROTEIN: CRP: 0.6 mg/dL (ref <1.0)

## 2020-02-03 LAB — FIBRIN DERIVATIVES D-DIMER (ARMC ONLY): Fibrin derivatives D-dimer (ARMC): 2468.27 ng/mL (FEU) — ABNORMAL HIGH (ref 0.00–499.00)

## 2020-02-03 LAB — MAGNESIUM: Magnesium: 2.2 mg/dL (ref 1.7–2.4)

## 2020-02-03 MED ORDER — APIXABAN 5 MG PO TABS: 5.0000 mg | ORAL_TABLET | Freq: Two times a day (BID) | ORAL | Status: DC

## 2020-02-03 MED ORDER — APIXABAN 5 MG PO TABS: 10.0000 mg | ORAL_TABLET | Freq: Two times a day (BID) | ORAL | Status: DC

## 2020-02-03 MED ORDER — APIXABAN 5 MG PO TABS
10.0000 mg | ORAL_TABLET | Freq: Two times a day (BID) | ORAL | Status: DC
  Administered 2020-02-03 – 2020-02-04 (×3): 10 mg via ORAL
  Filled 2020-02-03 (×4): qty 2

## 2020-02-03 NOTE — Progress Notes (Signed)
PT Cancellation Note  Patient Details Name: [name] MRN: [id number] DOB: 04-28-1941   Cancelled Treatment:    Reason Eval/Treat Not Completed: Medical issues which prohibited therapy   Pt with + DVT.  Will hold at this time until treatment is completed.   [name] 02/03/2020, 8:14 AM

## 2020-02-03 NOTE — Progress Notes (Signed)
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  Patient was initially diagnosed with Covid-19 on 01/21/2020.  Patient was to receive the monoclonal antibody for Covid-19 when she was noted to have hypoxia with SPO2 84% on room air, and was directed to the emergency department for further evaluation and treatment.  In the ED, temperature 98.1, BP 114/52, HR 84, RR 18, SPO2 72 % on room air.  2, potassium 3.8, chloride 100, CO2 22, glucose 130, BUN 18, creatinine 0.97.  The BC count 33, hemoglobin 12.2, platelets 73.  Procalcitonin 0.22, lactic acid 1.5, troponin 28.  X-ray with findings of multifocal pneumonia and ovoid density left upper lobe.  NSR, rate 67, no concerning ST elevation/depressions or T wave inversions.  Given her hypoxia, patient was started on Covid-19 treatment protocol.  Hospitalist service requested for admission.   Assessment & Plan:   Principal Problem:   Pneumonia due to COVID-19 virus Active Problems:   Syncope and collapse   CLL (chronic lymphocytic leukemia) (HCC)   Acute respiratory failure due to COVID-19 (HCC)   Thrombocytopenia (HCC)   Acute hypoxic respiratory failure secondary to acute Covid-19 viral pneumonia during the ongoing 2020/2021 Covid 19 Pandemic - POA Patient presenting to ED with progressive shortness of breath.  Was to receive monoclonal antibody infusion for known diagnosis of Covid-19 on 01/21/2020.  During that evaluation, patient was noted to be hypoxic on room air; was subsequently directed to the ED for further evaluation.  Chest x-ray notable for multifocal pneumonia.  Elevated D-dimer and CRP.  CT angiogram chest negative for pulmonary  embolism. --COVID test: + 01/21/2020 --CRP 3.8>2.8>1.7>0.7>0.6 --ddimer 1428>1209>1332>2060>2468 --Completed 5-day course of remdesivir on 02/02/2020 --Solu-Medrol 80 mg IV q12h --Baricitinib 4 mg p.o. daily (Day #4/14) --Lasix 40mg  IV q12h --prone for 2-3hrs every 12hrs if able --Continue supplemental oxygen, titrate to maintain SPO2 greater than 9%, on 15L HFNC and 15L NRB --Continue supportive care with albuterol MDI prn, vitamin C, zinc, Tylenol, antitussives (benzonatate/ Mucinex/Tussionex) --Follow CBC, CMP, D-dimer, ferritin, and CRP daily --Continue airborne/contact isolation precautions for 3 weeks from the day of diagnosis  The treatment plan and use of medications and known side effects were discussed with patient/family. Some of the medications used are based on case reports/anecdotal data.  All other medications being used in the management of COVID-19 based on limited study data.  Complete risks and long-term side effects are unknown, however in the best clinical judgment they seem to be of some benefit.  Patient wanted to proceed with treatment options provided.  Left upper lobe density Chest x-ray on admission notable for ovoid density left upper lobe. --Recommend follow-up chest x-ray versus CT chest outpatient for further evaluation/interval resolution for concern of possible nodule.  Right lower extremity DVT Vascular duplex ultrasound positive for age indeterminate right peroneal vein DVT. --Started on Eliquis  Syncopal episode Patient was witnessed to have a syncopal episode while in the ED, likely related to severe hypoxia.  Echocardiogram with LVEF 55-60%, no regional wall motion normalities LV, mild LVH, grade 1 diastolic dysfunction, mild MR. --Continue monitor on telemetry --PT/OT recommends home health on discharge --Continue therapy efforts while inpatient  Lymphoma/leukemia Follows with medical oncology at [facility].  On chemotherapy  outpatient which is currently on hold since diagnosis of Covid-19. --Continue follow-up with medical oncology on discharge.  Depression/anxiety --Sertraline 50 mg p.o. daily --Lorazepam 0.5 mg qHS  Iron deficiency anemia:  Hemoglobin 11.1, stable. --Continue ferrous sulfate 325 mg p.o. daily  Hyperlipidemia: Continue pravastatin 40 mg p.o. daily  GERD: Continue PPI   DVT prophylaxis: SCDs Code Status: Full code Family Communication: Updated patients son, [name] via telephone this afternoon.  Disposition Plan:  Status is: Inpatient  Remains inpatient appropriate because:Ongoing diagnostic testing needed not appropriate for outpatient work up, Unsafe d/c plan, IV treatments appropriate due to intensity of illness or inability to take PO and Inpatient level of care appropriate due to severity of illness   Dispo: The patient is from: Home              Anticipated d/c is to: Home              Anticipated d/c date is: > 3 days              Patient currently is not medically stable to d/c.  Continues with high FiO2 requirements   Consultants:   None  Procedures:   Transthoracic echocardiogram 01/30/2020: IMPRESSIONS  1. Left ventricular ejection fraction, by estimation, is 55 to 60%. The  left ventricle has normal function. The left ventricle has no regional  wall motion abnormalities. There is mild left ventricular hypertrophy.  Left ventricular diastolic parameters  are consistent with Grade I diastolic dysfunction (impaired relaxation).  2. Right ventricular systolic function is normal. The right ventricular  size is normal.  3. Mild mitral valve regurgitation.   CT angiogram chest: Pending  Ultrasound duplex bilateral lower extremities: Pending   Antimicrobials:   None   Subjective: Patient seen and examined bedside, continues on 15 L high flow nasal cannula with 15 L NRB with SPO2 91% this morning.  CT angiogram chest negative for PE yesterday, but vascular  duplex ultrasound notable for right lower extremity DVT.  Started on Eliquis today.  Continues with shortness of breath, especially with any type of exertion.  Also with mild nonproductive cough.  No other concerns or complaints this morning. Patient denies headache, no dizziness, no chest pain, palpitations, no abdominal pain, no fever/chills/night sweats, no nausea/vomiting/diarrhea.  Objective: Vitals:   02/02/20 2328 02/03/20 0441 02/03/20 0804 02/03/20 1212  BP: (!) 131/58 (!) 128/56 127/60 125/79  Pulse: 66 62 75 73  Resp: 17 18 (!) 30 16  Temp: 98.3 F (36.8 C) 98.3 F (36.8 C) 98.8 F (37.1 C) 97.6 F (36.4 C)  TempSrc: Oral   Oral  SpO2: 95% 91% 94% 93%  Weight:      Height:       No intake or output data in the 24 hours ending 02/03/20 1246 Filed Weights   01/02/2020 1002  Weight: 77.1 kg    Examination:  General exam: Mild respiratory distress, otherwise NAD Respiratory system: Breath sounds slightly decreased bilateral bases otherwise clear to auscultation. Slightly increased respiratory effort without accessory muscle use on 15L nasal cannula and 15L NRM with SPO2 91%. Cardiovascular system: S1 & S2 heard, RRR. No JVD, murmurs, rubs, gallops or clicks. No pedal edema. Gastrointestinal system: Abdomen is nondistended, soft and nontender. No organomegaly or masses felt. Normal bowel sounds heard. Central nervous system: Alert and oriented. No focal neurological deficits. Extremities: Symmetric 5 x 5 power. Skin: No rashes, lesions or ulcers Psychiatry: Judgement and insight appear normal. Mood & affect appropriate.  Data Reviewed: I have personally reviewed following labs and imaging studies  CBC: Recent Labs  Lab 01/30/20 0412 01/31/20 0714 02/01/20 0500 02/02/20 0538 02/03/20 0519  WBC 1.5* 1.8* 2.1* 4.9 5.0  NEUTROABS 1.1* 1.5* 1.8 4.5 4.6  HGB 10.4* 10.8* 11.1* 10.9* 11.6*  HCT 31.9* 32.5* 32.6* 32.3* 34.0*  MCV 82.4 81.5 78.2* 80.8 79.8*  PLT 61*  55* 70* 83* 92*   Basic Metabolic Panel: Recent Labs  Lab 01/30/20 0412 01/31/20 0714 02/01/20 0500 02/02/20 0538 02/03/20 0519  NA 135 134* 134* 135 138  K 3.9 3.9 3.9 4.4 4.0  CL 104 106 105 104 103  CO2 21* 19* 20* 21* 23  GLUCOSE 120* 123* 161* 143* 141*  BUN 18 20 21  29* 39*  CREATININE 0.76 0.73 0.76 0.72 0.87  CALCIUM 8.2* 8.7* 8.6* 9.2 8.9  MG 1.9 2.2 1.9 2.1 2.2  PHOS 3.9 4.1 3.5 3.9 4.2   GFR: Estimated Creatinine Clearance: 54.3 mL/min (by C-G formula based on SCr of 0.87 mg/dL). Liver Function Tests: Recent Labs  Lab 01/30/20 0412 01/31/20 0714 02/01/20 0500 02/02/20 0538 02/03/20 0519  AST 94* 83* 70* 53* 42*  ALT 64* 59* 54* 49* 45*  ALKPHOS 314* 319* 308* 274* 260*  BILITOT 0.7 0.5 0.6 0.6 0.7  PROT 5.6* 5.5* 5.6* 5.5* 5.7*  ALBUMIN 3.0* 2.9* 2.8* 2.8* 3.0*   No results for input(s): LIPASE, AMYLASE in the last 168 hours. No results for input(s): AMMONIA in the last 168 hours. Coagulation Profile: Recent Labs  Lab 01/17/2020 1023  INR 1.0   Cardiac Enzymes: No results for input(s): CKTOTAL, CKMB, CKMBINDEX, TROPONINI in the last 168 hours. BNP (last 3 results) No results for input(s): PROBNP in the last 8760 hours. HbA1C: No results for input(s): HGBA1C in the last 72 hours. CBG: Recent Labs  Lab 01/12/2020 1000  GLUCAP 135*   Lipid Profile: No results for input(s): CHOL, HDL, LDLCALC, TRIG, CHOLHDL, LDLDIRECT in the last 72 hours. Thyroid Function Tests: No results for input(s): TSH, T4TOTAL, FREET4, T3FREE, THYROIDAB in the last 72 hours. Anemia Panel: Recent Labs    02/02/20 0538 02/03/20 0519  FERRITIN 107 90   Sepsis Labs: Recent Labs  Lab 01/19/2020 1023 01/19/2020 1145  PROCALCITON 0.22  --   LATICACIDVEN 1.5 1.3    Recent Results (from the past 240 hour(s))  Blood culture (routine single)     Status: None   Collection Time: 01/09/2020 10:23 AM   Specimen: BLOOD LEFT ARM  Result Value Ref Range Status   Specimen  Description BLOOD LEFT ARM  Final   Special Requests   Final    BOTTLES DRAWN AEROBIC AND ANAEROBIC Blood Culture adequate volume   Culture   Final    NO GROWTH 5 DAYS Performed at [facility], [address], [place], [place] [address]    Report Status 02/03/2020 FINAL  Final  Blood culture (single)     Status: None   Collection Time: 01/08/2020 11:45 AM   Specimen: BLOOD  Result Value Ref Range Status   Specimen Description BLOOD LEFT ANTECUBITAL  Final   Special Requests   Final    BOTTLES DRAWN AEROBIC AND ANAEROBIC Blood Culture adequate volume   Culture   Final    NO GROWTH 5 DAYS Performed at [facility], [address] [place], [place] [address]    Report Status 02/03/2020 FINAL  Final         Radiology Studies: CT ANGIO CHEST PE  W OR WO CONTRAST  Result Date: 02/02/2020 CLINICAL DATA:  PE suspected, low/intermediate prob, positive D-dimer Leukemia/lymphoma on active chemotherapy. Progressive shortness of breath. COVID positive 01/21/2020 EXAM: CT ANGIOGRAPHY CHEST WITH CONTRAST TECHNIQUE: Multidetector CT imaging of the chest was performed using the standard protocol during bolus administration of intravenous contrast. Multiplanar CT image reconstructions and MIPs were obtained to evaluate the vascular anatomy. CONTRAST:  85mL OMNIPAQUE IOHEXOL 350 MG/ML SOLN COMPARISON:  Radiograph earlier today.  Chest CT 04/04/2016 FINDINGS: Cardiovascular: There are no filling defects within the pulmonary arteries to suggest pulmonary embolus. Breathing motion artifact limits subsegmental as well as basilar assessment. Aortic atherosclerosis without dissection or acute aortic findings. No aortic aneurysm. Borderline cardiomegaly. No pericardial effusion. Coronary artery calcifications. Right chest port in place. Mediastinum/Nodes: Large hiatal hernia with majority of the stomach intrathoracic. Patulous upper esophagus. No suspicious thyroid nodule. There are no enlarged  mediastinal, hilar, or axillary lymph nodes. No evidence of supraclavicular adenopathy. Lungs/Pleura: Generalized ground-glass opacity throughout both lungs, with slight superimposed consolidation peripherally in the lower lobes. Trace bilateral pleural effusions, right greater than left. Trachea and central bronchi are patent. Occasional bronchiectasis in the lingula, right middle lobe and possibly in the lower lobes, motion obscured. Upper Abdomen: No acute or unexpected findings. Musculoskeletal: Scoliosis and mild degenerative change throughout the spine. Degenerative change of the right shoulder. There are no acute or suspicious osseous abnormalities. No suspicious chest wall abnormality. Review of the MIP images confirms the above findings. IMPRESSION: 1. No pulmonary embolus allowing for motion artifact. 2. Generalized ground-glass opacity throughout both lungs, with slight superimposed consolidation peripherally in the lower lobes. Findings most consistent with COVID-19 pneumonia. Parenchymal involvement is diffuse. 3. Lingular and right middle lobe bronchiectasis, with possible lower lobe bronchiectasis, motion obscured. 4. Trace bilateral pleural effusions, right greater than left. 5. Large hiatal hernia with majority of the stomach intrathoracic. Patulous upper esophagus. Aortic Atherosclerosis (ICD10-I70.0). Electronically Signed   By: Keith Rake M.D.   On: 02/02/2020 17:03   US Venous Img Lower Bilateral (DVT)  Result Date: 02/02/2020 CLINICAL DATA:  Elevated D-dimer. Right lower extremity pain. History of malignancy. Evaluate for DVT. EXAM: BILATERAL LOWER EXTREMITY VENOUS DOPPLER ULTRASOUND TECHNIQUE: Gray-scale sonography with graded compression, as well as color Doppler and duplex ultrasound were performed to evaluate the lower extremity deep venous systems from the level of the common femoral vein and including the common femoral, femoral, profunda femoral, popliteal and calf veins  including the posterior tibial, peroneal and gastrocnemius veins when visible. The superficial great saphenous vein was also interrogated. Spectral Doppler was utilized to evaluate flow at rest and with distal augmentation maneuvers in the common femoral, femoral and popliteal veins. COMPARISON:  None. FINDINGS: RIGHT LOWER EXTREMITY Common Femoral Vein: No evidence of thrombus. Normal compressibility, respiratory phasicity and response to augmentation. Saphenofemoral Junction: No evidence of thrombus. Normal compressibility and flow on color Doppler imaging. Profunda Femoral Vein: No evidence of thrombus. Normal compressibility and flow on color Doppler imaging. Femoral Vein: No evidence of thrombus. Normal compressibility, respiratory phasicity and response to augmentation. Popliteal Vein: No evidence of thrombus. Normal compressibility, respiratory phasicity and response to augmentation. Calf Veins: There is mixed echogenic nonocclusive wall thickening/DVT involving the right peroneal vein (images 30 and 32). The right posterior tibial veins appear patent where imaged. Superficial Great Saphenous Vein: No evidence of thrombus. Normal compressibility. Venous Reflux:  None. Other Findings:  None. LEFT LOWER EXTREMITY Common Femoral Vein: No evidence of thrombus. Normal compressibility, respiratory phasicity  and response to augmentation. Saphenofemoral Junction: No evidence of thrombus. Normal compressibility and flow on color Doppler imaging. Profunda Femoral Vein: No evidence of thrombus. Normal compressibility and flow on color Doppler imaging. Femoral Vein: No evidence of thrombus. Normal compressibility, respiratory phasicity and response to augmentation. Popliteal Vein: No evidence of thrombus. Normal compressibility, respiratory phasicity and response to augmentation. Calf Veins: No evidence of thrombus. Normal compressibility and flow on color Doppler imaging. Superficial Great Saphenous Vein: No evidence of  thrombus. Normal compressibility. Venous Reflux:  None. Other Findings:  None. IMPRESSION: 1. Examination is positive for age-indeterminate, though potentially chronic, nonocclusive wall thickening/DVT involving the right peroneal vein. There is no extension of this age-indeterminate, though potentially chronic, nonocclusive short-segment distal tibial DVT to the more proximal venous system of the right lower extremity. 2. No evidence of DVT within the left lower extremity. Electronically Signed   By: Sandi Mariscal M.D.   On: 02/02/2020 15:24   DG Chest Port 1 View  Result Date: 02/02/2020 CLINICAL DATA:  Shortness of breath EXAM: PORTABLE CHEST 1 VIEW COMPARISON:  01/18/2020 FINDINGS: Lower volumes and progressively confluent airspace disease. Cardiopericardial enlargement. No visible pneumothorax. Porta catheter on the right with tip at the upper cavoatrial junction. IMPRESSION: Worsening lung volumes and diffuse airspacedisease. Electronically Signed   By: Monte Fantasia M.D.   On: 02/02/2020 05:34        Scheduled Meds:  albuterol  2 puff Inhalation Q6H   apixaban  10 mg Oral BID   Followed by   Derrill Memo ON 02/09/2020] apixaban  5 mg Oral BID   vitamin C  500 mg Oral Daily   aspirin EC  81 mg Oral Daily   baricitinib  4 mg Oral Daily   ferrous sulfate  325 mg Oral QHS   fesoterodine  4 mg Oral Daily   furosemide  40 mg Intravenous Q12H   LORazepam  0.5 mg Oral QHS   mouth rinse  15 mL Mouth Rinse BID   methylPREDNISolone (SOLU-MEDROL) injection  80 mg Intravenous Q12H   multivitamin with minerals  1 tablet Oral Daily   pantoprazole  40 mg Oral BID   pravastatin  40 mg Oral QHS   sertraline  50 mg Oral Daily   sodium chloride flush  3 mL Intravenous Q12H   topiramate  50 mg Oral Daily   zinc sulfate  220 mg Oral Daily   Continuous Infusions:  sodium chloride       LOS: 5 days    Time spent: 38 minutes spent on chart review, discussion with nursing staff,  consultants, updating family and interview/physical exam; more than 50% of that time was spent in counseling and/or coordination of care.    Ailyne Pawley J [place] DO [facility] Available via Epic secure chat 7am-7pm After these hours, please refer to coverage provider listed on [email or web address] 02/03/2020, 12:46 PM

## 2020-02-03 NOTE — Progress Notes (Signed)
ANTICOAGULATION CONSULT NOTE - Initial Consult  Pharmacy Consult for Apixaban  Indication: DVT  Allergies  Allergen Reactions  . Baclofen Other (See Comments)    "made me go crazy" "made me go crazy"     Patient Measurements: Height: 5\' 7"  (170.2 cm) Weight: 77.1 kg (170 lb) IBW/kg (Calculated) : 61.6 Heparin Dosing Weight:   Vital Signs: Temp: 98.3 F (36.8 C) (10/05 0441) Temp Source: Oral (10/04 2328) BP: 128/56 (10/05 0441) Pulse Rate: 62 (10/05 0441)  Labs: Recent Labs    01/31/20 0714 01/31/20 0714 02/01/20 0500 02/01/20 0500 02/02/20 0538 02/03/20 0519  HGB 10.8*   < > 11.1*   < > 10.9* 11.6*  HCT 32.5*   < > 32.6*  --  32.3* 34.0*  PLT 55*   < > 70*  --  83* 92*  CREATININE 0.73  --  0.76  --  0.72  --    < > = values in this interval not displayed.    Estimated Creatinine Clearance: 59 mL/min (by C-G formula based on SCr of 0.72 mg/dL).   Medical History: Past Medical History:  Diagnosis Date  . Acute diarrhea 04/08/2014  . Anxiety   . Asthma   . 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 Cancer [facility])    lymphoma/leukemia   . Chronic migraine without aura 03/13/2014   Last Assessment & Plan:  Headaches stable generally   . Depression   . Fibrocystic disease of breast   . GERD (gastroesophageal reflux disease)   . Hiatal hernia   . Hyperlipidemia   . Osteoporosis   . Peptic ulcer disease   . Personal history of chemotherapy   . Psoriasis   . Renal stone   . Syncope and collapse 10/29/2009   Qualifier: Diagnosis of  By: Rockey Situ MD, Tim      Medications:  Medications Prior to Admission  Medication Sig Dispense Refill Last Dose  . amLODipine (NORVASC) 5 MG tablet Take 5 mg by mouth daily.   Past Week at Unknown time  . aspirin EC 81 MG tablet Take 81 mg daily by mouth.   Past Week at Unknown time  . azithromycin (ZITHROMAX) 250 MG tablet Take 250-500 mg by mouth daily.   Past Week at Unknown time  . entecavir (BARACLUDE) 0.5 MG tablet Take 0.5  mg by mouth daily.   Past Week at Unknown time  . furosemide (LASIX) 20 MG tablet Take 20 mg by mouth daily as needed.   Past Week at Unknown time  . LORazepam (ATIVAN) 0.5 MG tablet Take 0.5 mg by mouth at bedtime.    Past Week at Unknown time  . mirtazapine (REMERON) 7.5 MG tablet Take 7.5 mg by mouth every evening.   Past Week at Unknown time  . Multiple Vitamin (MULTI-VITAMIN) tablet Take 1 tablet by mouth daily.   Past Week at Unknown time  . pantoprazole (PROTONIX) 40 MG tablet Take 40 mg by mouth 2 (two) times daily.    Past Week at Unknown time  . sertraline (ZOLOFT) 50 MG tablet Take 50 mg by mouth daily.    Past Week at Unknown time  . Butalbital-APAP-Caffeine 50-300-40 MG CAPS Take 1 capsule by mouth every 4 (four) hours as needed for migraine.   unknown at prn    Assessment: Pharmacy consulted to dose apixaban in this 79 year old female admitted with DVT, COVID.  No prior anticoag noted.   SrCr = 0.72,  TBW = 77.1 kg   Goal  of Therapy:  resolution of DVT    Plan:  Apixaban 10 mg PO BID X 7 days ordered to start on 10/5 @ 0700 followed by Apixaban 5 mg PO BID.  Meegan Shanafelt D 02/03/2020,7:06 AM

## 2020-02-03 NOTE — Progress Notes (Signed)
OT Cancellation Note  Patient Details Name: [name] MRN: [id number] DOB: 1941-02-28   Cancelled Treatment:    Reason Eval/Treat Not Completed: Medical issues which prohibited therapy. Pt noted with +DVT. Anticoagulation initiated this am. Will hold OT tx this date and re-attempt next date pending medically appropriate.   Jeni Salles, MPH, MS, OTR/L ascom [phone] 02/03/20, 1:22 PM

## 2020-02-03 NOTE — Progress Notes (Signed)
Patient with O2 sats 78%.  Upon entry into patient's room, I found her attempting to get out of bed with bed alarm turned on without assistance to the [facility].  I assisted her to [facility] and after returning to bed, patient agreed to call for assistance.  Bed alarm on.  Patient recovered her O2 sats to 91% prior to me leaving her room.

## 2020-02-04 ENCOUNTER — Inpatient Hospital Stay: Payer: Medicare HMO

## 2020-02-04 DIAGNOSIS — U071 COVID-19: Secondary | ICD-10-CM | POA: Diagnosis not present

## 2020-02-04 DIAGNOSIS — J1282 Pneumonia due to Coronavirus disease 2019: Secondary | ICD-10-CM | POA: Diagnosis not present

## 2020-02-04 LAB — BLOOD GAS, ARTERIAL
Acid-Base Excess: 1.4 mmol/L (ref 0.0–2.0)
Bicarbonate: 24.7 mmol/L (ref 20.0–28.0)
FIO2: 1
O2 Saturation: 93.5 %
Patient temperature: 37
pCO2 arterial: 34 mmHg (ref 32.0–48.0)
pH, Arterial: 7.47 — ABNORMAL HIGH (ref 7.350–7.450)
pO2, Arterial: 64 mmHg — ABNORMAL LOW (ref 83.0–108.0)

## 2020-02-04 LAB — CBC
HCT: 36.2 % (ref 36.0–46.0)
Hemoglobin: 12.5 g/dL (ref 12.0–15.0)
MCH: 27.2 pg (ref 26.0–34.0)
MCHC: 34.5 g/dL (ref 30.0–36.0)
MCV: 78.7 fL — ABNORMAL LOW (ref 80.0–100.0)
Platelets: 114 10*3/uL — ABNORMAL LOW (ref 150–400)
RBC: 4.6 MIL/uL (ref 3.87–5.11)
RDW: 17.3 % — ABNORMAL HIGH (ref 11.5–15.5)
WBC: 9.3 10*3/uL (ref 4.0–10.5)
nRBC: 0 % (ref 0.0–0.2)

## 2020-02-04 LAB — BASIC METABOLIC PANEL
Anion gap: 12 (ref 5–15)
BUN: 48 mg/dL — ABNORMAL HIGH (ref 8–23)
CO2: 27 mmol/L (ref 22–32)
Calcium: 9.1 mg/dL (ref 8.9–10.3)
Chloride: 98 mmol/L (ref 98–111)
Creatinine, Ser: 0.99 mg/dL (ref 0.44–1.00)
GFR calc non Af Amer: 53 mL/min — ABNORMAL LOW (ref >60)
Glucose, Bld: 153 mg/dL — ABNORMAL HIGH (ref 70–99)
Potassium: 4 mmol/L (ref 3.5–5.1)
Sodium: 137 mmol/L (ref 135–145)

## 2020-02-04 LAB — FIBRIN DERIVATIVES D-DIMER (ARMC ONLY): Fibrin derivatives D-dimer (ARMC): 2863.59 ng/mL (FEU) — ABNORMAL HIGH (ref 0.00–499.00)

## 2020-02-04 LAB — GLUCOSE, CAPILLARY
Glucose-Capillary: 141 mg/dL — ABNORMAL HIGH (ref 70–99)
Glucose-Capillary: 169 mg/dL — ABNORMAL HIGH (ref 70–99)
Glucose-Capillary: 184 mg/dL — ABNORMAL HIGH (ref 70–99)

## 2020-02-04 LAB — MAGNESIUM: Magnesium: 2.3 mg/dL (ref 1.7–2.4)

## 2020-02-04 LAB — MRSA PCR SCREENING: MRSA by PCR: NEGATIVE

## 2020-02-04 LAB — C-REACTIVE PROTEIN: CRP: 0.6 mg/dL (ref <1.0)

## 2020-02-04 MED ORDER — BARICITINIB 2 MG PO TABS
2.0000 mg | ORAL_TABLET | Freq: Every day | ORAL | Status: DC
  Administered 2020-02-04: 2 mg via ORAL
  Filled 2020-02-04: qty 1

## 2020-02-04 MED ORDER — CHLORHEXIDINE GLUCONATE CLOTH 2 % EX PADS
6.0000 | MEDICATED_PAD | Freq: Every day | CUTANEOUS | Status: DC
  Administered 2020-02-04: 6 via TOPICAL

## 2020-02-04 MED ORDER — MORPHINE SULFATE (PF) 2 MG/ML IV SOLN
1.0000 mg | INTRAVENOUS | Status: DC | PRN
  Administered 2020-02-04 – 2020-02-05 (×2): 2 mg via INTRAVENOUS
  Filled 2020-02-04 (×2): qty 1

## 2020-02-04 MED ORDER — INFLUENZA VAC A&B SA ADJ QUAD 0.5 ML IM PRSY
0.5000 mL | PREFILLED_SYRINGE | INTRAMUSCULAR | Status: AC
  Administered 2020-02-05: 0.5 mL via INTRAMUSCULAR
  Filled 2020-02-04 (×2): qty 0.5

## 2020-02-04 MED ORDER — HALOPERIDOL LACTATE 5 MG/ML IJ SOLN
2.0000 mg | Freq: Four times a day (QID) | INTRAMUSCULAR | Status: DC | PRN
  Administered 2020-02-05: 2 mg via INTRAVENOUS
  Filled 2020-02-04: qty 1

## 2020-02-04 NOTE — Progress Notes (Signed)
Assisted tele visit to patient with family member.  [name] D Julie Thayer, RN   

## 2020-02-04 NOTE — Progress Notes (Signed)
Assisted tele visit to patient with son.  Thomas, Mylee Falin Renee, RN   

## 2020-02-04 NOTE — Progress Notes (Signed)
Patient noted to have O2 sats between 78-82%.  Upon entry into patient's room, she was found to be SOB, using accessory muscles, counted RR 48, pale complection and anxious.  Patient repositioned and O2 sats recovered to 84-86%.  MD notified at [facility] of patient's status and order received to transfer patient for heated high flow.

## 2020-02-04 NOTE — Progress Notes (Signed)
OT Cancellation Note  Patient Details Name: [name] MRN: [id number] DOB: Sep 06, 1940   Cancelled Treatment:    Reason Eval/Treat Not Completed: Medical issues which prohibited therapy  Pt continues to have low O2 sats while on heated high flow and NRB. Pt has order to transfer to higher level of care-PCU. Will hold therapy at this time. Will require "Continue at transfer order" or new OT order upon transfer if appropriate to continue services. Thank you.  Gerrianne Scale, [place], OTR/L ascom [phone] 02/04/20, 2:26 PM

## 2020-02-04 NOTE — Progress Notes (Signed)
PHARMACY NOTE:  ANTIMICROBIAL RENAL DOSAGE ADJUSTMENT  Current antimicrobial regimen includes a mismatch between antimicrobial dosage and estimated renal function.  As per policy approved by the Pharmacy & Therapeutics and Medical Executive Committees, the antimicrobial dosage will be adjusted accordingly.  Current antimicrobial dosage:  baricitinib 4mg   Indication: COVID PNA  Renal Function:  Estimated Creatinine Clearance: 47.7 mL/min (by C-G formula based on SCr of 0.99 mg/dL).  GFR from > 60 to 53 today    Antimicrobial dosage has been changed to:  baricitinib 2mg   Additional comments:   Thank you for allowing pharmacy to be a part of this patient's care.  Lu Duffel, PharmD, BCPS Clinical Pharmacist 02/04/2020 8:43 AM

## 2020-02-04 NOTE — Progress Notes (Signed)
Assisted tele visit to patient with son.  Thomas, Shemeka Wardle Renee, RN   

## 2020-02-04 NOTE — Consult Note (Signed)
Name: [name] MRN: [id number] DOB: 1940-06-19    ADMISSION DATE:  01/20/2020 CONSULTATION DATE:  02/04/2020  REFERRING MD :  Dr. Priscella Mann  CHIEF COMPLAINT:  Worsening Hypoxia & Increasing FiO2 requirements   BRIEF PATIENT DESCRIPTION:  79 y.o. 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  Pt also with RLE DVT.  SIGNIFICANT EVENTS  9/30: Presented to ED; admitted to [place] unit 10/4: Found to have RLE DVT; CTA Chest negative for DVT 10/6: Worsening Hypoxia + increasing FiO2 requirements; transfer to Stepdown, PCCM consulted 10/6: Pt made DNR/DNI  STUDIES:  10/4: Venous US BLE>>1. Examination is positive for age-indeterminate, though potentially chronic, nonocclusive wall thickening/DVT involving the right peroneal vein. There is no extension of this age-indeterminate, though potentially chronic, nonocclusive short-segment distal tibial DVT to the more proximal venous system of the right lower extremity. 2. No evidence of DVT within the left lower extremity 10/4: CTA Chest>>1. No pulmonary embolus allowing for motion artifact. 2. Generalized ground-glass opacity throughout both lungs, with slight superimposed consolidation peripherally in the lower lobes. Findings most consistent with COVID-19 pneumonia. Parenchymal involvement is diffuse. 3. Lingular and right middle lobe bronchiectasis, with possible lower lobe bronchiectasis, motion obscured. 4. Trace bilateral pleural effusions, right greater than left. 5. Large hiatal hernia with majority of the stomach intrathoracic. Patulous upper esophagus. 10/6: CXR>>1. Findings suspicious for barotrauma with probable pneumomediastinum and soft tissue emphysema in the right supraclavicular area. Possible small left apical pneumothorax. Consider CT for further evaluation. 2. Unchanged bilateral airspace  opacities consistent with viral Pneumonia. 10/6: Repeat CXR>>1. Unchanged pneumomediastinum and subcutaneous emphysema in the right neck. No pneumothorax. 2. Unchanged multifocal COVID pneumonia.  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 Of note she was initially diagnosed with COVID-19 on 01/21/2020. Patient was to receive the monoclonal antibody for Covid-19 when she was noted to have hypoxia with SPO2 84% on room air, and was directed to the emergency department for further evaluation and treatment.  Initial workup in the ED showed elevated D-dimer and CRP.  CXR was concerning for Multifocal Pneumonia.  CTA Chest was negative for pulmonary embolism.  She was admitted to the COVID-19 unit by the Hospitalist for further workup and treatment of Acute Hypoxic Respiratory Failure secondary to COVID-19 Pneumonia.  She was placed on Remdesivir, IV Solu-Medrol, and Baricitinib.  On 02/04/2020 she had worsening hypoxia with increased FiO2 requirements, along with worsening mental status.  CXR with pneumomediastinum and questionable small left apical pneumothorax.  She was transferred to [facility] unit.  Follow up CXR shows unchanged pneumomediastinum, but NO Pneumothorax.  PCCM was consulted with for further assistance with managing Acute Hypoxic Respiratory Failure.  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  has a past surgical history that includes Vesicovaginal fistula closure w/ TAH; Appendectomy; Partial hysterectomy; Hernia repair; Colonoscopy with propofol (N/A, 03/19/2017); Esophagogastroduodenoscopy (egd) with propofol (  N/A, 03/19/2017); Excision of breast biopsy (Left, 2017); Breast excisional biopsy (Right, 2005); Breast excisional biopsy (Right, 1990); and Breast biopsy (Left, 03/15/2016). Prior to Admission medications   Medication Sig Start Date End Date Taking? Authorizing Provider  amLODipine (NORVASC) 5 MG tablet Take 5 mg by mouth daily. 08/13/19  Yes [provider]  aspirin EC 81 MG tablet Take 81 mg daily by mouth.   Yes [provider]  azithromycin (ZITHROMAX) 250 MG tablet Take 250-500 mg by mouth daily. 01/21/20  Yes [provider]  entecavir (BARACLUDE) 0.5 MG tablet Take 0.5 mg by mouth daily. 01/14/20  Yes [provider]  furosemide (LASIX) 20 MG tablet Take 20 mg by mouth daily as needed. 11/27/19  Yes [provider]  LORazepam (ATIVAN) 0.5 MG tablet Take 0.5 mg by mouth at bedtime.    Yes [provider]  mirtazapine (REMERON) 7.5 MG tablet Take 7.5 mg by mouth every evening. 10/30/19  Yes [provider]  Multiple Vitamin (MULTI-VITAMIN) tablet Take 1 tablet by mouth daily.   Yes [provider]  pantoprazole (PROTONIX) 40 MG tablet Take 40 mg by mouth 2 (two) times daily.  05/28/15  Yes [provider]  sertraline (ZOLOFT) 50 MG tablet Take 50 mg by mouth daily.  06/01/15  Yes [provider]  Butalbital-APAP-Caffeine 50-300-40 MG CAPS Take 1 capsule by mouth every 4 (four) hours as needed for migraine. 08/13/19   [provider]   Allergies  Allergen Reactions  . Baclofen Other (See Comments)    "made me go crazy" "made me go  crazy"     FAMILY HISTORY:  family history includes Breast cancer in her maternal aunt, maternal aunt, and paternal aunt; Congestive Heart Failure in her father and mother; Coronary artery disease in an other family member; Dementia in her mother; Hematuria in her father; Neuropathy in her brother. SOCIAL HISTORY:  reports that she has never smoked. She has never used smokeless tobacco. She reports that she does not drink alcohol and does not use drugs.   COVID-19 DISASTER DECLARATION:  FULL CONTACT PHYSICAL EXAMINATION WAS NOT POSSIBLE DUE TO TREATMENT OF COVID-19 AND  CONSERVATION OF PERSONAL PROTECTIVE EQUIPMENT, LIMITED EXAM FINDINGS INCLUDE-  Patient assessed or the symptoms described in the history of present illness.  In the context of the Global COVID-19 pandemic, which necessitated consideration that the patient might be at risk for infection with the SARS-CoV-2 virus that causes COVID-19, Institutional protocols and algorithms that pertain to the evaluation of patients at risk for COVID-19 are in a state of rapid change based on information released by regulatory bodies including the CDC and federal and state organizations. These policies and algorithms were followed during the patient's care while in hospital.  REVIEW OF SYSTEMS:   Unable to assess due to AMS, Critical Illness, & BiPAP  SUBJECTIVE:  Unable to assess due to AMS, Critical Illness, & BiPAP  VITAL SIGNS: Temp:  [97.4 F (36.3 C)-100.2 F (37.9 C)] 100.2 F (37.9 C) (10/06 1924) Pulse Rate:  [70-117] 117 (10/06 1515) Resp:  [17-33] 33 (10/06 1515) BP: (123-155)/(56-86) 155/86 (10/06 1515) SpO2:  [83 %-96 %] 93 % (10/06 1515) FiO2 (%):  [96.6 %-100 %] 100 % (10/06 1613) Weight:  [72.9 kg] 72.9 kg (10/06 1515)  PHYSICAL EXAMINATION: General: Critically ill appearing female, laying in bed, on 100% BiPAP, with moderate respiratory distress Neuro:  Lethargic (recevied Haldol), arouses to voice and able to  follow simple commands, but falls back  asleep,  No focal deficits, Pupils PERRL HEENT:  Atraumatic, normocephalic, neck supple, no JVD Cardiovascular:  Regular rate and rhythm, 2+ distal pulses Lungs:  Unable to auscultate due to CAPR, tachypnea, even, with assessory muscle use Abdomen:  Soft, nontender, nondistended, no guarding or rebound tenderness Musculoskeletal:  Generalized weakness, no deformities. Trace bilateral LE edema Skin:  Warm and dry.  No obvious rashes, lesions, or ulcerations  Recent Labs  Lab 02/02/20 0538 02/03/20 0519 02/04/20 0400  NA 135 138 137  K 4.4 4.0 4.0  CL 104 103 98  CO2 21* 23 27  BUN 29* 39* 48*  CREATININE 0.72 0.87 0.99  GLUCOSE 143* 141* 153*   Recent Labs  Lab 02/02/20 0538 02/03/20 0519 02/04/20 0400  HGB 10.9* 11.6* 12.5  HCT 32.3* 34.0* 36.2  WBC 4.9 5.0 9.3  PLT 83* 92* 114*   DG Chest Port 1 View  Result Date: 02/04/2020 CLINICAL DATA:  Hypoxia.  COVID 19 pneumonia. EXAM: PORTABLE CHEST 1 VIEW COMPARISON:  Chest x-ray from same day at 1607 hours. FINDINGS: Unchanged right chest wall port catheter. Stable cardiomediastinal silhouette with large hiatal hernia. Unchanged pneumomediastinum. No pneumothorax. Unchanged subcutaneous emphysema in the right neck. Diffuse hazy bilateral airspace opacities are unchanged. No acute osseous abnormality. IMPRESSION: 1. Unchanged pneumomediastinum and subcutaneous emphysema in the right neck. No pneumothorax. 2. Unchanged multifocal COVID pneumonia. Electronically Signed   By: Titus Dubin M.D.   On: 02/04/2020 19:30   DG Chest Port 1 View  Addendum Date: 02/04/2020   ADDENDUM REPORT: 02/04/2020 16:34 ADDENDUM: Critical Value/emergent results were called by telephone at the time of interpretation on 02/04/2020 at 4:34 pm to provider [facility] , who verbally acknowledged these results. Electronically Signed   By: Richardean Sale M.D.   On: 02/04/2020 16:34   Result Date: 02/04/2020 CLINICAL  DATA:  Hypoxemia.  COVID 19 pneumonia. EXAM: PORTABLE CHEST 1 VIEW COMPARISON:  Radiographs 01/11/2020 and 02/02/2020.  CT 02/02/2020. FINDINGS: 1607 hours. Right IJ Port-A-Cath is unchanged at the level of the superior cavoatrial junction. The heart size and mediastinal contours are stable with aortic atherosclerosis and a large hiatal hernia. There is new lucency along the right cardiophrenic angle and AP window suspicious for pneumomediastinum. In addition, there is new soft tissue emphysema in the right supraclavicular area. Linear lucency projecting over the superior left chest could reflect a pneumothorax. No definite right-sided pneumothorax. Underlying bilateral airspace opacities have not significantly changed. There is no mediastinal shift or significant pleural effusion. The bones appear unchanged. IMPRESSION: 1. Findings suspicious for barotrauma with probable pneumomediastinum and soft tissue emphysema in the right supraclavicular area. Possible small left apical pneumothorax. Consider CT for further evaluation. 2. Unchanged bilateral airspace opacities consistent with viral pneumonia. Electronically Signed: By: Richardean Sale M.D. On: 02/04/2020 16:28    ASSESSMENT / PLAN:  Acute Hypoxic Respiratory Failure secondary to COVID-19 Pneumonia Pneumomediastinum Left Upper Lobe Density on CT Chest -Supplemental O2 as needed to maintain O2 sats >88% -Currently on BiPAP -High risk for intubation -Follow intermittent CXR & ABG as needed -CTA Chest negative for PE on 02/02/20 -Repeat CXR on 02/04/20 negative for PTX, shows unchanged pneumomediastium -Self-proning as tolerated -Aggressive Pulmonary Toilet -Bronchodilators via MDI -Antitussives -Maintain Euvovolemia to net negative fluid balance -Diuresis as BP and renal function permits (currently on 40 mg Lasix BID) -Recommend follow up CT Chest outpatient for further evaluation of ? Nodule   COVID-19 Pneumonia -Monitor fever curve -Trend  WBC's  -Completed course of  Remdesivir on 02/02/20 -Continue Baricitinib (Day 5/14) -Follow inflammatory markers -Continue IV Steroids (Solu-Medrol 80 mg BID) -Vitamin C & Zinc   Right Lower Extremity DVT -Continue Eliquis   Lymphoma/Leukemia -Follows with Oncology at [place] on hold since dx of COVID-19 -Outpatient follow-up   Iron Deficiency Anemia -Monitor for S/Sx of bleeding -Trend CBC -Elquis for Anticoagulation/DVT Prophylaxis -Transfuse for Hgb <7 -Continue Fe Sulfate 325 mg PO Daily          Pt is critically ill, prognosis is guarded.  She is at high risk for intubation, cardiac arrest, and death.  Recommend DNR/DNI status.   BEST PRACTICES DISPOSITION: Stepdown GOALS OF CARE: DNR/DNI CONSULTS: Hospitalist (primary service), Palliative Care VTE PROPHYLAXIS: Lovenox SQ UPDATES: Called and discussed with pt's son [name] via telephone regarding that pt is at high risk for intubation due to increased FiO2 requirements and altered mental status.  We discussed given her age and other comorbidities, it is unlikely that she would wean from vent if intubated.  [name] mentions that she has previously made it known to him that she would never want to be intubated or be resuscitated.  [name] requests that she be made DNR/DNI, but that we continue to try and treat the treatable for now.  If she were to decline further or appear to be suffering, he requests to be updated and would likely transition to comfort measures.  Darel Hong, AGACNP-BC [facility] Pager: [phone]  02/04/2020, 8:19 PM

## 2020-02-04 NOTE — Progress Notes (Signed)
OT Cancellation Note  Patient Details Name: [name] MRN: [id number] DOB: 08/29/1940   Cancelled Treatment:    Reason Eval/Treat Not Completed: Medical issues which prohibited therapy  Pt ultimately required transfer to ICU. No "continue at transfer" order in place for therapy services. Will complete order and sign off at this time. Please re-consult if/when appropriate. Thank you.   Gerrianne Scale, [place], OTR/L ascom [phone] 02/04/20, 4:38 PM

## 2020-02-04 NOTE — Progress Notes (Signed)
RT started patient on heated high flow O2 along with her NRB.  O2 sats have been maintained from 89-93% and RR remains between 28-48.  Patient will be transferred when a room is available

## 2020-02-04 NOTE — Progress Notes (Signed)
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  Patient was initially diagnosed with Covid-19 on 01/21/2020.  Patient was to receive the monoclonal antibody for Covid-19 when she was noted to have hypoxia with SPO2 84% on room air, and was directed to the emergency department for further evaluation and treatment.  10/6: Patient seen and examined.  Assumed care of patient this morning.  Acute desaturation event noted this morning.  Attempted heated high flow nasal cannula however mentation worsened to the point the patient required transfer to stepdown status.  Currently patient is requiring 60 L heated high flow to 100% FiO2.  Prognosis guarded.  Discussed with son regarding CODE STATUS.  He wishes to speak with the patient and his siblings prior to making final decision.   Assessment & Plan:   Principal Problem:   Pneumonia due to COVID-19 virus Active Problems:   Syncope and collapse   CLL (chronic lymphocytic leukemia) (HCC)   Acute respiratory failure due to COVID-19 (HCC)   Thrombocytopenia (HCC)  Acute hypoxic respiratory failure secondary to acute Covid-19 viral pneumonia during the ongoing 2020/2021 Covid 19 Pandemic - POA Patient presenting to ED with progressive shortness of breath.  Was to receive monoclonal antibody infusion for known diagnosis of Covid-19 on 01/21/2020.  During that evaluation, patient was noted to be hypoxic on room air; was subsequently directed to the ED for further evaluation.  Chest x-ray notable for multifocal pneumonia.  Elevated D-dimer and CRP.  CT angiogram chest negative for pulmonary embolism. 10/6: Acute desaturation required initiation of heated high flow and  transferred to stepdown unit --COVID test: + 01/21/2020 --CRP 3.8>2.8>1.7>0.7>0.6>0.6 --ddimer [id number] --Completed 5-day course of remdesivir on 02/02/2020 --Solu-Medrol 80 mg IV q12h --Baricitinib 4 mg p.o. daily (Day #5/14) --Lasix 40mg  IV q12h --prone for 2-3hrs every 12hrs if able --Supplemental oxygen.  Wean as tolerated.  Currently on 60 L heated high flow nasal cannula  Pneumomediastinum Possible small left apical pneumothorax Received call from radiologist stating presence of pneumomediastinum and possible small left apical pneumothorax.  Recommend repeat x-ray within 1 to 2 hours to assess severity.  Left upper lobe density Chest x-ray on admission notable for ovoid density left upper lobe. --Recommend follow-up chest x-ray versus CT chest outpatient for further evaluation/interval resolution for concern of possible nodule.  Right lower extremity DVT Vascular duplex ultrasound positive for age indeterminate right peroneal vein DVT. --Started on Eliquis  Syncopal episode Patient was witnessed to have a syncopal episode while in the ED, likely related to severe hypoxia.  Echocardiogram with LVEF 55-60%, no regional wall motion normalities LV, mild LVH, grade 1 diastolic dysfunction, mild MR. --Continue monitor on telemetry --PT/OT recommends home health on discharge --Continue therapy efforts while inpatient  Lymphoma/leukemia Follows with medical oncology at [facility].  On chemotherapy outpatient which is currently on hold since diagnosis of Covid-19. --Continue follow-up with medical oncology on discharge.  Depression/anxiety --Sertraline 50 mg p.o. daily --Lorazepam 0.5 mg qHS  Iron deficiency anemia:  Hemoglobin 11.1, stable. --Continue ferrous sulfate 325 mg p.o. daily  Hyperlipidemia: Continue pravastatin 40 mg p.o. daily  GERD: Continue PPI   DVT prophylaxis: Lovenox Code Status: Full.  I discussed this with  the patient's son.  He will consider  switching to DNR once he is able to speak to the patient and his siblings Family Communication: Patient son [name] [phone] on 10/6 Disposition Plan: Status is: Inpatient  Remains inpatient appropriate because:Hemodynamically unstable and Inpatient level of care appropriate due to severity of illness   Dispo:  Patient From: Home  Planned Disposition: To be determined  Expected discharge date: >3 days  Medically stable for discharge: No  Markedly hypoxic.  Acute desaturation today requiring initiation of heated high flow nasal cannula and transferred to stepdown unit.  Prognosis guarded.   Consultants:   none   Procedures:  none   Antimicrobials: Remdesivir   Subjective: Seen and examined.  Hypoxic, lethargic.  Objective: Vitals:   02/04/20 1300 02/04/20 1400 02/04/20 1448 02/04/20 1515  BP: (!) 123/56  (!) 130/59 (!) 155/86  Pulse: 95  88 (!) 117  Resp: (!) 22   (!) 33  Temp: 98.5 F (36.9 C)  98.6 F (37 C) 99.4 F (37.4 C)  TempSrc:    Axillary  SpO2: (!) 86% 92%  93%  Weight:    72.9 kg  Height:        Intake/Output Summary (Last 24 hours) at 02/04/2020 1626 Last data filed at 02/04/2020 1000 Gross per 24 hour  Intake 240 ml  Output 950 ml  Net -710 ml   Filed Weights   01/04/2020 1002 02/04/20 1515  Weight: 77.1 kg 72.9 kg    Examination:  General exam: Appears lethargic Respiratory system: Diffuse coarse crackles bilaterally.  Increased work of breathing.  Heated high flow  cardiovascular system: Tachycardic, regular rhythm, no murmurs Gastrointestinal system: Abdomen is nondistended, soft and nontender. No organomegaly or masses felt. Normal bowel sounds heard. Central nervous system: Lethargic, unable to assess  extremities: Diffusely decreased power bilaterally Skin: No rashes, lesions or ulcers Psychiatry: Judgement and insight appear poor. Mood & affect appropriate.     Data Reviewed: I have  personally reviewed following labs and imaging studies  CBC: Recent Labs  Lab 01/30/20 0412 01/30/20 0412 01/31/20 0714 02/01/20 0500 02/02/20 0538 02/03/20 0519 02/04/20 0400  WBC 1.5*   < > 1.8* 2.1* 4.9 5.0 9.3  NEUTROABS 1.1*  --  1.5* 1.8 4.5 4.6  --   HGB 10.4*   < > 10.8* 11.1* 10.9* 11.6* 12.5  HCT 31.9*   < > 32.5* 32.6* 32.3* 34.0* 36.2  MCV 82.4   < > 81.5 78.2* 80.8 79.8* 78.7*  PLT 61*   < > 55* 70* 83* 92* 114*   < > = values in this interval not displayed.   Basic Metabolic Panel: Recent Labs  Lab 01/30/20 0412 01/30/20 0412 01/31/20 0714 02/01/20 0500 02/02/20 0538 02/03/20 0519 02/04/20 0400  NA 135   < > 134* 134* 135 138 137  K 3.9   < > 3.9 3.9 4.4 4.0 4.0  CL 104   < > 106 105 104 103 98  CO2 21*   < > 19* 20* 21* 23 27  GLUCOSE 120*   < > 123* 161* 143* 141* 153*  BUN 18   < > 20 21 29* 39* 48*  CREATININE 0.76   < > 0.73 0.76 0.72 0.87 0.99  CALCIUM 8.2*   < > 8.7* 8.6* 9.2 8.9 9.1  MG 1.9   < > 2.2 1.9 2.1 2.2 2.3  PHOS 3.9  --  4.1 3.5 3.9 4.2  --    < > = values in this interval not displayed.   GFR:  Estimated Creatinine Clearance: 43.3 mL/min (by C-G formula based on SCr of 0.99 mg/dL). Liver Function Tests: Recent Labs  Lab 01/30/20 0412 01/31/20 0714 02/01/20 0500 02/02/20 0538 02/03/20 0519  AST 94* 83* 70* 53* 42*  ALT 64* 59* 54* 49* 45*  ALKPHOS 314* 319* 308* 274* 260*  BILITOT 0.7 0.5 0.6 0.6 0.7  PROT 5.6* 5.5* 5.6* 5.5* 5.7*  ALBUMIN 3.0* 2.9* 2.8* 2.8* 3.0*   No results for input(s): LIPASE, AMYLASE in the last 168 hours. No results for input(s): AMMONIA in the last 168 hours. Coagulation Profile: Recent Labs  Lab 01/16/2020 1023  INR 1.0   Cardiac Enzymes: No results for input(s): CKTOTAL, CKMB, CKMBINDEX, TROPONINI in the last 168 hours. BNP (last 3 results) No results for input(s): PROBNP in the last 8760 hours. HbA1C: No results for input(s): HGBA1C in the last 72 hours. CBG: Recent Labs  Lab  01/24/2020 1000  GLUCAP 135*   Lipid Profile: No results for input(s): CHOL, HDL, LDLCALC, TRIG, CHOLHDL, LDLDIRECT in the last 72 hours. Thyroid Function Tests: No results for input(s): TSH, T4TOTAL, FREET4, T3FREE, THYROIDAB in the last 72 hours. Anemia Panel: Recent Labs    02/02/20 0538 02/03/20 0519  FERRITIN 107 90   Sepsis Labs: Recent Labs  Lab 01/26/2020 1023 01/17/2020 1145  PROCALCITON 0.22  --   LATICACIDVEN 1.5 1.3    Recent Results (from the past 240 hour(s))  Blood culture (routine single)     Status: None   Collection Time: 01/28/2020 10:23 AM   Specimen: BLOOD LEFT ARM  Result Value Ref Range Status   Specimen Description BLOOD LEFT ARM  Final   Special Requests   Final    BOTTLES DRAWN AEROBIC AND ANAEROBIC Blood Culture adequate volume   Culture   Final    NO GROWTH 5 DAYS Performed at [facility], [address] [place], [place] [address]    Report Status 02/03/2020 FINAL  Final  Blood culture (single)     Status: None   Collection Time: 01/25/2020 11:45 AM   Specimen: BLOOD  Result Value Ref Range Status   Specimen Description BLOOD LEFT ANTECUBITAL  Final   Special Requests   Final    BOTTLES DRAWN AEROBIC AND ANAEROBIC Blood Culture adequate volume   Culture   Final    NO GROWTH 5 DAYS Performed at [facility], [address], [place], [place] [address]    Report Status 02/03/2020 FINAL  Final         Radiology Studies: CT ANGIO CHEST PE W OR WO CONTRAST  Result Date: 02/02/2020 CLINICAL DATA:  PE suspected, low/intermediate prob, positive D-dimer Leukemia/lymphoma on active chemotherapy. Progressive shortness of breath. COVID positive 01/21/2020 EXAM: CT ANGIOGRAPHY CHEST WITH CONTRAST TECHNIQUE: Multidetector CT imaging of the chest was performed using the standard protocol during bolus administration of intravenous contrast. Multiplanar CT image reconstructions and MIPs were obtained to evaluate the vascular anatomy.  CONTRAST:  51mL OMNIPAQUE IOHEXOL 350 MG/ML SOLN COMPARISON:  Radiograph earlier today.  Chest CT 04/04/2016 FINDINGS: Cardiovascular: There are no filling defects within the pulmonary arteries to suggest pulmonary embolus. Breathing motion artifact limits subsegmental as well as basilar assessment. Aortic atherosclerosis without dissection or acute aortic findings. No aortic aneurysm. Borderline cardiomegaly. No pericardial effusion. Coronary artery calcifications. Right chest port in place. Mediastinum/Nodes: Large hiatal hernia with majority of the stomach intrathoracic. Patulous upper esophagus. No suspicious thyroid nodule. There are no enlarged mediastinal, hilar, or axillary lymph nodes. No evidence  of supraclavicular adenopathy. Lungs/Pleura: Generalized ground-glass opacity throughout both lungs, with slight superimposed consolidation peripherally in the lower lobes. Trace bilateral pleural effusions, right greater than left. Trachea and central bronchi are patent. Occasional bronchiectasis in the lingula, right middle lobe and possibly in the lower lobes, motion obscured. Upper Abdomen: No acute or unexpected findings. Musculoskeletal: Scoliosis and mild degenerative change throughout the spine. Degenerative change of the right shoulder. There are no acute or suspicious osseous abnormalities. No suspicious chest wall abnormality. Review of the MIP images confirms the above findings. IMPRESSION: 1. No pulmonary embolus allowing for motion artifact. 2. Generalized ground-glass opacity throughout both lungs, with slight superimposed consolidation peripherally in the lower lobes. Findings most consistent with COVID-19 pneumonia. Parenchymal involvement is diffuse. 3. Lingular and right middle lobe bronchiectasis, with possible lower lobe bronchiectasis, motion obscured. 4. Trace bilateral pleural effusions, right greater than left. 5. Large hiatal hernia with majority of the stomach intrathoracic. Patulous  upper esophagus. Aortic Atherosclerosis (ICD10-I70.0). Electronically Signed   By: Keith Rake M.D.   On: 02/02/2020 17:03        Scheduled Meds: . albuterol  2 puff Inhalation Q6H  . apixaban  10 mg Oral BID   Followed by  . [START ON 02/09/2020] apixaban  5 mg Oral BID  . vitamin C  500 mg Oral Daily  . aspirin EC  81 mg Oral Daily  . baricitinib  2 mg Oral Daily  . Chlorhexidine Gluconate Cloth  6 each Topical Daily  . ferrous sulfate  325 mg Oral QHS  . fesoterodine  4 mg Oral Daily  . furosemide  40 mg Intravenous Q12H  . [START ON 02/05/2020] influenza vaccine adjuvanted  0.5 mL Intramuscular Tomorrow-1000  . LORazepam  0.5 mg Oral QHS  . mouth rinse  15 mL Mouth Rinse BID  . methylPREDNISolone (SOLU-MEDROL) injection  80 mg Intravenous Q12H  . multivitamin with minerals  1 tablet Oral Daily  . pantoprazole  40 mg Oral BID  . pravastatin  40 mg Oral QHS  . sertraline  50 mg Oral Daily  . sodium chloride flush  3 mL Intravenous Q12H  . topiramate  50 mg Oral Daily  . zinc sulfate  220 mg Oral Daily   Continuous Infusions: . sodium chloride       LOS: 6 days    Time spent: 35 minutes    Sidney Ace, MD [facility] Pager [phone]  If 7PM-7AM, please contact night-coverage 02/04/2020, 4:26 PM

## 2020-02-04 NOTE — Progress Notes (Signed)
PT Cancellation Note  Patient Details Name: [name] MRN: [id number] DOB: July 28, 1940   Cancelled Treatment:    Reason Eval/Treat Not Completed:  (Per chart review, patient noted with transfer to CCU due to decline in respiratory status. Per guidelines, wiill require new order to resume PT services.  Will complete initial order; please re-consult as medically appropriate.)  Ikaika Showers H. Owens Shark, PT, DPT, NCS 02/04/20, 9:59 PM [phone]

## 2020-02-05 ENCOUNTER — Inpatient Hospital Stay: Payer: Medicare HMO

## 2020-02-05 DIAGNOSIS — J1282 Pneumonia due to coronavirus disease 2019: Secondary | ICD-10-CM

## 2020-02-05 DIAGNOSIS — U071 COVID-19: Principal | ICD-10-CM

## 2020-02-05 DIAGNOSIS — J9600 Acute respiratory failure, unspecified whether with hypoxia or hypercapnia: Secondary | ICD-10-CM | POA: Diagnosis not present

## 2020-02-05 DIAGNOSIS — J9601 Acute respiratory failure with hypoxia: Secondary | ICD-10-CM

## 2020-02-05 LAB — BASIC METABOLIC PANEL
Anion gap: 14 (ref 5–15)
BUN: 54 mg/dL — ABNORMAL HIGH (ref 8–23)
CO2: 25 mmol/L (ref 22–32)
Calcium: 9.4 mg/dL (ref 8.9–10.3)
Chloride: 101 mmol/L (ref 98–111)
Creatinine, Ser: 1.3 mg/dL — ABNORMAL HIGH (ref 0.44–1.00)
GFR calc non Af Amer: 38 mL/min — ABNORMAL LOW (ref >60)
Glucose, Bld: 212 mg/dL — ABNORMAL HIGH (ref 70–99)
Potassium: 3.2 mmol/L — ABNORMAL LOW (ref 3.5–5.1)
Sodium: 140 mmol/L (ref 135–145)

## 2020-02-05 LAB — CBC
HCT: 36.1 % (ref 36.0–46.0)
Hemoglobin: 12.4 g/dL (ref 12.0–15.0)
MCH: 27.3 pg (ref 26.0–34.0)
MCHC: 34.3 g/dL (ref 30.0–36.0)
MCV: 79.5 fL — ABNORMAL LOW (ref 80.0–100.0)
Platelets: 111 10*3/uL — ABNORMAL LOW (ref 150–400)
RBC: 4.54 MIL/uL (ref 3.87–5.11)
RDW: 17.3 % — ABNORMAL HIGH (ref 11.5–15.5)
WBC: 5.8 10*3/uL (ref 4.0–10.5)
nRBC: 0 % (ref 0.0–0.2)

## 2020-02-05 LAB — MAGNESIUM: Magnesium: 2.7 mg/dL — ABNORMAL HIGH (ref 1.7–2.4)

## 2020-02-05 LAB — GLUCOSE, CAPILLARY
Glucose-Capillary: 216 mg/dL — ABNORMAL HIGH (ref 70–99)
Glucose-Capillary: 201 mg/dL — ABNORMAL HIGH (ref 70–99)

## 2020-02-05 MED ORDER — LIDOCAINE VISCOUS HCL 2 % MT SOLN: 5.0000 mL | Freq: Four times a day (QID) | OROMUCOSAL | Status: DC | PRN

## 2020-02-05 MED ORDER — POTASSIUM CHLORIDE 10 MEQ/100ML IV SOLN
10.0000 meq | INTRAVENOUS | Status: AC
  Administered 2020-02-05: 10 meq via INTRAVENOUS
  Filled 2020-02-05 (×2): qty 100

## 2020-02-05 MED ORDER — POLYVINYL ALCOHOL 1.4 % OP SOLN
1.0000 [drp] | Freq: Four times a day (QID) | OPHTHALMIC | Status: DC | PRN
  Filled 2020-02-05: qty 15

## 2020-02-05 MED ORDER — GLYCOPYRROLATE 1 MG PO TABS
1.0000 mg | ORAL_TABLET | ORAL | Status: DC | PRN
  Filled 2020-02-05: qty 1

## 2020-02-05 MED ORDER — DIPHENHYDRAMINE HCL 50 MG/ML IJ SOLN: 12.5000 mg | INTRAMUSCULAR | Status: DC | PRN

## 2020-02-05 MED ORDER — LORAZEPAM 1 MG PO TABS: 1.0000 mg | ORAL_TABLET | ORAL | Status: DC | PRN

## 2020-02-05 MED ORDER — ONDANSETRON 4 MG PO TBDP: 4.0000 mg | ORAL_TABLET | Freq: Four times a day (QID) | ORAL | Status: DC | PRN

## 2020-02-05 MED ORDER — GLYCOPYRROLATE 0.2 MG/ML IJ SOLN: 0.2000 mg | INTRAMUSCULAR | Status: DC | PRN

## 2020-02-05 MED ORDER — LORAZEPAM 2 MG/ML PO CONC: 1.0000 mg | ORAL | Status: DC | PRN

## 2020-02-05 MED ORDER — HYDROMORPHONE HCL 1 MG/ML IJ SOLN
0.5000 mg | INTRAMUSCULAR | Status: DC | PRN
  Administered 2020-02-05 – 2020-02-06 (×3): 0.5 mg via INTRAVENOUS
  Filled 2020-02-05 (×3): qty 1

## 2020-02-05 MED ORDER — MAGIC MOUTHWASH
10.0000 mL | Freq: Four times a day (QID) | ORAL | Status: DC | PRN
  Filled 2020-02-05: qty 10

## 2020-02-05 MED ORDER — ALBUTEROL SULFATE (2.5 MG/3ML) 0.083% IN NEBU: 2.5000 mg | INHALATION_SOLUTION | RESPIRATORY_TRACT | Status: DC | PRN

## 2020-02-05 MED ORDER — LORAZEPAM 2 MG/ML IJ SOLN
1.0000 mg | INTRAMUSCULAR | Status: DC | PRN
  Administered 2020-02-05: 1 mg via INTRAVENOUS
  Filled 2020-02-05: qty 1

## 2020-02-05 MED ORDER — ONDANSETRON HCL 4 MG/2ML IJ SOLN: 4.0000 mg | Freq: Four times a day (QID) | INTRAMUSCULAR | Status: DC | PRN

## 2020-02-05 MED ORDER — MAGIC MOUTHWASH W/LIDOCAINE: 15.0000 mL | Freq: Four times a day (QID) | ORAL | Status: DC | PRN

## 2020-02-05 MED ORDER — BIOTENE DRY MOUTH MT LIQD: 15.0000 mL | OROMUCOSAL | Status: DC | PRN

## 2020-02-05 NOTE — Progress Notes (Signed)
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  Patient was initially diagnosed with Covid-19 on 01/21/2020.  Patient was to receive the monoclonal antibody for Covid-19 when she was noted to have hypoxia with SPO2 84% on room air, and was directed to the emergency department for further evaluation and treatment.  10/6: Patient seen and examined.  Assumed care of patient this morning.  Acute desaturation event noted this morning.  Attempted heated high flow nasal cannula however mentation worsened to the point the patient required transfer to stepdown status.  Currently patient is requiring 60 L heated high flow to 100% FiO2.  Prognosis guarded.  Discussed with son regarding CODE STATUS.  He wishes to speak with the patient and his siblings prior to making final decision.  10/7: Patient consulted.  Critical care attending spoke with patient's son this morning.  Explained that patient was suffering with severe hypoxia.  Patient with weak cough and difficulty clearing secretions, increased work of breathing accessory muscle use noted.  Patient overall unresponsive, does not open eyes to command.  Patient is a DNR status at this time.  I met with patient's son [name] at bedside.  Relayed clinical condition and prognosis.  [name], his wife, their daughter: Understanding.  Proceed with comfort measures at this time.   Assessment & Plan:   Principal Problem:   Pneumonia due to COVID-19 virus Active Problems:   Syncope and collapse   CLL (chronic lymphocytic leukemia) (HCC)   Acute respiratory failure due to COVID-19 (HCC)   Thrombocytopenia (HCC)   Acute respiratory failure with hypoxia (HCC)  Acute hypoxic  respiratory failure secondary to acute Covid-19 viral pneumonia during the ongoing 2020/2021 Covid 19 Pandemic - POA Patient presenting to ED with progressive shortness of breath.  Was to receive monoclonal antibody infusion for known diagnosis of Covid-19 on 01/21/2020.  During that evaluation, patient was noted to be hypoxic on room air; was subsequently directed to the ED for further evaluation.  Chest x-ray notable for multifocal pneumonia.  Elevated D-dimer and CRP.  CT angiogram chest negative for pulmonary embolism. 10/6: Acute desaturation required initiation of heated high flow and transferred to stepdown unit --COVID test: + 01/21/2020 --CRP 3.8>2.8>1.7>0.7>0.6>0.6 --ddimer [id number] --Completed 5-day course of remdesivir on 02/02/2020 Plan: Proceed with comfort measures.  All medications not focused on patient comfort have been discontinued.  IV morphine, IV Ativan, IV Robinul on board.  Pneumomediastinum Possible small left apical pneumothorax Received call from radiologist stating presence of pneumomediastinum and possible small left apical pneumothorax.  Repeat chest x-ray shows resolution of pneumothorax No further work-up  Left upper lobe density Chest x-ray on admission notable for ovoid density left upper lobe. --Recommend follow-up chest x-ray versus CT chest outpatient for further evaluation/interval resolution for concern of possible nodule. -Patient on comfort measures  Right lower extremity DVT Vascular duplex ultrasound positive for age indeterminate right peroneal vein DVT. --Started on Eliquis.  Now discontinued given comfort measures status  Syncopal episode Patient was witnessed to have a syncopal episode while in the ED, likely related to severe hypoxia.  Echocardiogram with LVEF 55-60%, no regional wall motion normalities LV, mild LVH, grade 1 diastolic dysfunction, mild MR.   Lymphoma/leukemia Follows [facility] medical [facility]ology at [facility].  On chemotherapy outpatient which is currently on hold since diagnosis of Covid-19.   Depression/anxiety Po meds held given comfort measures status  Iron deficiency anemia:  Hemoglobin 11.1, stable.   Hyperlipidemia:  Statin discontinued  GERD: Continue PPI   DVT prophylaxis: Comfort measures Code Status: DNR.  Comfort measures  Family Communication: Patient son [name] [phone] on 10/7 Disposition Plan: Status is: Inpatient  Remains inpatient appropriate because:Hemodynamically unstable and IV treatments appropriate due to intensity of illness or inability to take PO   Dispo:  Patient From: Home  Planned Disposition: Anticipating hospital death  Expected discharge date: 02/26/2020  Medically stable for discharge: No  Patient transitioned to comfort measures status        Consultants:   ICU   Procedures:  none   Antimicrobials: None   Subjective: Seen and examined.  Hypoxic, lethargic.  Unable to communicate.  Objective: Vitals:   02/05/20 0757 02/05/20 0800 02/05/20 0900 02/05/20 1000  BP:  127/72 134/71 (!) 149/77  Pulse: 77 77 74 80  Resp: (!) 28 (!) 29 (!) 29 (!) 37  Temp:   (!) 97.2 F (36.2 C)   TempSrc:   Axillary   SpO2: 94% 92% 93% 97%  Weight:      Height:        Intake/Output Summary (Last 24 hours) at 02/05/2020 1327 Last data filed at 02/05/2020 1200 Gross per 24 hour  Intake --  Output 1775 ml  Net -1775 ml   Filed Weights   01/12/2020 1002 02/04/20 1515 02/05/20 0500  Weight: 77.1 kg 72.9 kg 72.6 kg    Examination:  Limited exam due to t comfort measures only status  General exam: Appears lethargic Respiratory system: Diffuse coarse crackles bilaterally.  Increased work of breathing.  Heated high flow  cardiovascular system: Tachycardic, regular rhythm, no murmurs     Data Reviewed: I have personally reviewed following labs and imaging studies  CBC: Recent Labs  Lab 01/30/20 0412  01/30/20 0412 01/31/20 0714 01/31/20 0714 02/01/20 0500 02/02/20 0538 02/03/20 0519 02/04/20 0400 02/05/20 0626  WBC 1.5*   < > 1.8*   < > 2.1* 4.9 5.0 9.3 5.8  NEUTROABS 1.1*  --  1.5*  --  1.8 4.5 4.6  --   --   HGB 10.4*   < > 10.8*   < > 11.1* 10.9* 11.6* 12.5 12.4  HCT 31.9*   < > 32.5*   < > 32.6* 32.3* 34.0* 36.2 36.1  MCV 82.4   < > 81.5   < > 78.2* 80.8 79.8* 78.7* 79.5*  PLT 61*   < > 55*   < > 70* 83* 92* 114* 111*   < > = values in this interval not displayed.   Basic Metabolic Panel: Recent Labs  Lab 01/30/20 0412 01/30/20 0412 01/31/20 0714 01/31/20 0714 02/01/20 0500 02/02/20 0538 02/03/20 0519 02/04/20 0400 02/05/20 0626  NA 135   < > 134*   < > 134* 135 138 137 140  K 3.9   < > 3.9   < > 3.9 4.4 4.0 4.0 3.2*  CL 104   < > 106   < > 105 104 103 98 101  CO2 21*   < > 19*   < > 20* 21* 23 27 25   GLUCOSE 120*   < > 123*   < > 161* 143* 141* 153* 212*  BUN 18   < > 20   < > 21 29* 39* 48* 54*  CREATININE 0.76   < > 0.73   < > 0.76 0.72 0.87 0.99 1.30*  CALCIUM 8.2*   < > 8.7*   < > 8.6* 9.2 8.9 9.1 9.4  MG 1.9   < > 2.2   < > 1.9 2.1 2.2 2.3 2.7*  PHOS 3.9  --  4.1  --  3.5 3.9 4.2  --   --    < > = values in this interval not displayed.   GFR: Estimated Creatinine Clearance: 33 mL/min (A) (by C-G formula based on SCr of 1.3 mg/dL (H)). Liver Function Tests: Recent Labs  Lab 01/30/20 0412 01/31/20 0714 02/01/20 0500 02/02/20 0538 02/03/20 0519  AST 94* 83* 70* 53* 42*  ALT 64* 59* 54* 49* 45*  ALKPHOS 314* 319* 308* 274* 260*  BILITOT 0.7 0.5 0.6 0.6 0.7  PROT 5.6* 5.5* 5.6* 5.5* 5.7*  ALBUMIN 3.0* 2.9* 2.8* 2.8* 3.0*   No results for input(s): LIPASE, AMYLASE in the last 168 hours. No results for input(s): AMMONIA in the last 168 hours. Coagulation Profile: No results for input(s): INR, PROTIME in the last 168 hours. Cardiac Enzymes: No results for input(s): CKTOTAL, CKMB, CKMBINDEX, TROPONINI in the last 168 hours. BNP (last 3 results) No  results for input(s): PROBNP in the last 8760 hours. HbA1C: No results for input(s): HGBA1C in the last 72 hours. CBG: Recent Labs  Lab 02/04/20 1518 02/04/20 2000 02/04/20 2344 02/05/20 0447 02/05/20 0747  GLUCAP 169* 141* 184* 216* 201*   Lipid Profile: No results for input(s): CHOL, HDL, LDLCALC, TRIG, CHOLHDL, LDLDIRECT in the last 72 hours. Thyroid Function Tests: No results for input(s): TSH, T4TOTAL, FREET4, T3FREE, THYROIDAB in the last 72 hours. Anemia Panel: Recent Labs    02/03/20 0519  FERRITIN 90   Sepsis Labs: No results for input(s): PROCALCITON, LATICACIDVEN in the last 168 hours.  Recent Results (from the past 240 hour(s))  Blood culture (routine single)     Status: None   Collection Time: 01/01/2020 10:23 AM   Specimen: BLOOD LEFT ARM  Result Value Ref Range Status   Specimen Description BLOOD LEFT ARM  Final   Special Requests   Final    BOTTLES DRAWN AEROBIC AND ANAEROBIC Blood Culture adequate volume   Culture   Final    NO GROWTH 5 DAYS Performed at [facility], [address] [place], [place] [address]    Report Status 02/03/2020 FINAL  Final  Blood culture (single)     Status: None   Collection Time: 01/07/2020 11:45 AM   Specimen: BLOOD  Result Value Ref Range Status   Specimen Description BLOOD LEFT ANTECUBITAL  Final   Special Requests   Final    BOTTLES DRAWN AEROBIC AND ANAEROBIC Blood Culture adequate volume   Culture   Final    NO GROWTH 5 DAYS Performed at [facility], [address] [place], [place] [address]    Report Status 02/03/2020 FINAL  Final  MRSA PCR Screening     Status: None   Collection Time: 02/04/20  3:16 PM   Specimen: Nasopharyngeal  Result Value Ref Range Status   MRSA by PCR NEGATIVE NEGATIVE Final    Comment:        The GeneXpert MRSA Assay (FDA approved for NASAL specimens only), is one component of a comprehensive MRSA colonization surveillance program. It is not intended to  diagnose MRSA infection nor to guide or monitor treatment for MRSA infections. Performed at [facility], [id number]  [address], [place], [place] [address]          Radiology Studies: DG Chest Port 1 View  Result Date: 02/05/2020 CLINICAL DATA:  Acute respiratory failure.  COVID. EXAM: PORTABLE CHEST 1 VIEW COMPARISON:  02/04/2020. FINDINGS: PowerPort catheter in stable position. Pneumomediastinum again noted. Stable cardiomegaly. Diffuse bilateral pulmonary infiltrates are again noted without interim change. No pleural effusion or pneumothorax. Subcutaneous emphysema again noted about the neck and right chest. IMPRESSION: 1. PowerPort catheter in stable position. 2. Pneumomediastinum again noted. Subcutaneous emphysema again noted about the neck and right chest. 3. Diffuse bilateral pulmonary infiltrates are again noted consistent with COVID pneumonia. No interim change. 4. Stable cardiomegaly. Electronically Signed   By: Marcello Moores  Register   On: 02/05/2020 05:06   DG Chest Port 1 View  Result Date: 02/04/2020 CLINICAL DATA:  Hypoxia.  COVID 19 pneumonia. EXAM: PORTABLE CHEST 1 VIEW COMPARISON:  Chest x-ray from same day at 1607 hours. FINDINGS: Unchanged right chest wall port catheter. Stable cardiomediastinal silhouette with large hiatal hernia. Unchanged pneumomediastinum. No pneumothorax. Unchanged subcutaneous emphysema in the right neck. Diffuse hazy bilateral airspace opacities are unchanged. No acute osseous abnormality. IMPRESSION: 1. Unchanged pneumomediastinum and subcutaneous emphysema in the right neck. No pneumothorax. 2. Unchanged multifocal COVID pneumonia. Electronically Signed   By: Titus Dubin M.D.   On: 02/04/2020 19:30   DG Chest Port 1 View  Addendum Date: 02/04/2020   ADDENDUM REPORT: 02/04/2020 16:34 ADDENDUM: Critical Value/emergent results were called by telephone at the time of interpretation on 02/04/2020 at 4:34 pm to provider [facility] , who verbally  acknowledged these results. Electronically Signed   By: Richardean Sale M.D.   On: 02/04/2020 16:34   Result Date: 02/04/2020 CLINICAL DATA:  Hypoxemia.  COVID 19 pneumonia. EXAM: PORTABLE CHEST 1 VIEW COMPARISON:  Radiographs 01/23/2020 and 02/02/2020.  CT 02/02/2020. FINDINGS: 1607 hours. Right IJ Port-A-Cath is unchanged at the level of the superior cavoatrial junction. The heart size and mediastinal contours are stable with aortic atherosclerosis and a large hiatal hernia. There is new lucency along the right cardiophrenic angle and AP window suspicious for pneumomediastinum. In addition, there is new soft tissue emphysema in the right supraclavicular area. Linear lucency projecting over the superior left chest could reflect a pneumothorax. No definite right-sided pneumothorax. Underlying bilateral airspace opacities have not significantly changed. There is no mediastinal shift or significant pleural effusion. The bones appear unchanged. IMPRESSION: 1. Findings suspicious for barotrauma with probable pneumomediastinum and soft tissue emphysema in the right supraclavicular area. Possible small left apical pneumothorax. Consider CT for further evaluation. 2. Unchanged bilateral airspace opacities consistent with viral pneumonia. Electronically Signed: By: Richardean Sale M.D. On: 02/04/2020 16:28        Scheduled Meds:  Chlorhexidine Gluconate Cloth  6 each Topical Daily   mouth rinse  15 mL Mouth Rinse BID   sodium chloride flush  3 mL Intravenous Q12H   Continuous Infusions:  sodium chloride       LOS: 7 days    Time spent: 25 minutes    Sidney Ace, MD [facility] Pager [phone]  If 7PM-7AM, please contact night-coverage 02/05/2020, 1:27 PM

## 2020-02-05 NOTE — Progress Notes (Signed)
GOALS OF CARE DISCUSSION  The Clinical status was relayed to family in detail.-Son [name]  Updated and notified of patients medical condition.  Patient remains unresponsive and will not open eyes to command.     Patient is having a weak cough and struggling to remove secretions.   patient with increased WOB and using accessory muscles to breathe Explained to family course of therapy and the modalities     Patient with Progressive multiorgan failure with very low chance of meaningful recovery despite all aggressive and optimal medical therapy. Patient is in the Dying  Process associated with Suffering.  Family understands the situation.  They have consented and agreed to DNR/DNI and would like to proceed with Comfort care measures today. They stated they will be here in 2 hrs  Family are satisfied with Plan of action and management. All questions answered  Additional CC time 32 mins   [name] Kalana Pesa, M.D.  [facility]  Medical Director [place] Director [facility] Cardio-Pulmonary Department

## 2020-02-06 MED ORDER — HYDROMORPHONE HCL 1 MG/ML PO LIQD: 1.0000 mg | ORAL | Status: DC | PRN

## 2020-02-06 MED ORDER — MORPHINE 100MG IN NS 100ML (1MG/ML) PREMIX INFUSION
1.0000 mg/h | INTRAVENOUS | Status: DC
  Administered 2020-02-06: 1 mg/h via INTRAVENOUS
  Filled 2020-02-06: qty 100

## 2020-02-09 NOTE — Unmapped (Signed)
Pats son picked up and told me his mother passed away 11/01/2022 due to Covid.

## 2020-02-11 NOTE — Unmapped (Signed)
This patient has been disenrolled from the [facility] specialty pharmacy services due to patient is deceased.    Katrina Gould  [facility] Specialty Pharmacist

## 2020-03-01 NOTE — Death Summary Note (Signed)
  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 Patient was initially diagnosed with Covid-19 on 01/21/2020. Patient was to receive the monoclonal antibody for Covid-19 when she was noted to have hypoxia with SPO2 84% on room air, and was directed to the emergency department for further evaluation and treatment.  10/6: Patient seen and examined.  Assumed care of patient this morning.  Acute desaturation event noted this morning.  Attempted heated high flow nasal cannula however mentation worsened to the point the patient required transfer to stepdown status.  Currently patient is requiring 60 L heated high flow to 100% FiO2.  Prognosis guarded.  Discussed with son regarding CODE STATUS.  He wishes to speak with the patient and his siblings prior to making final decision.  10/7: Patient consulted.  Critical care attending spoke with patient's son this morning.  Explained that patient was suffering with severe hypoxia.  Patient with weak cough and difficulty clearing secretions, increased work of breathing accessory muscle use noted.  Patient overall unresponsive, does not open eyes to command.  Patient is a DNR status at this time.  I met with patient's son [name] at bedside.  Relayed clinical condition and prognosis.  [name], his wife, their daughter: Understanding.  Proceed with comfort measures at this time.  2023/02/21: On my evaluation this morning the patient was significantly declined from prior evaluation..  To be short of breath.  Verbally unresponsive.  Did not follow commands.  Discussed with bedside nurse.  Initiated morphine infusion, titrated to patient comfort.  This was effective but unfortunately I received a notification from bedside RN stating patient had expired.  Time of death 10:57 AM.  Went to bedside once the patient's family  arrived.  My spoke with the patient's son [name].  I offered my condolences.  [name] expressed gratitude for care.  Official time of death: 1057 on 02-21-2020  Ralene Muskrat MD

## 2020-03-01 NOTE — Progress Notes (Signed)
Patient without heartbeat x 1 min verified by 2 RN. Time of death 10:57am. Notified son, [name] upon arrival to hospital. Oleh Genin at bedside to assist family with spiritual needs. MD notified at time of death.

## 2020-03-01 NOTE — Progress Notes (Signed)
   Chaplain On-Call received page from [employer] with report of patient's death and the family is present.  Chaplain received background medical history from [place], and met the patient's son, daughter-in-law, and granddaughter in ICU Waiting Room.  Chaplain accompanied them to the patient's room and provided supportive listening at bedside as her family described the patient's full life, and recent medical problems due to the COVID virus.   Chaplain offered much spiritual and emotional support and prayer, and escorted family to the [place] for their departure.  Lebec Ameer Sanden M.Div., [facility]

## 2020-03-01 NOTE — Progress Notes (Signed)
Notified patient's son [name] of patient's current condition and respiratory status. Son in route to hospital at this time.

## 2020-03-01 DEATH — deceased
